# Patient Record
Sex: Male | Born: 1960 | Hispanic: No | Smoking: Former smoker
Health system: Southern US, Community
[De-identification: ages and names within clinical notes are randomized; demographics above are authoritative.]

## PROBLEM LIST (undated history)

## (undated) DIAGNOSIS — Z720 Tobacco use: Secondary | ICD-10-CM

## (undated) DIAGNOSIS — J449 Chronic obstructive pulmonary disease, unspecified: Secondary | ICD-10-CM

## (undated) DIAGNOSIS — F32A Depression, unspecified: Secondary | ICD-10-CM

## (undated) DIAGNOSIS — R0602 Shortness of breath: Secondary | ICD-10-CM

## (undated) DIAGNOSIS — I509 Heart failure, unspecified: Secondary | ICD-10-CM

## (undated) DIAGNOSIS — Z950 Presence of cardiac pacemaker: Secondary | ICD-10-CM

## (undated) DIAGNOSIS — J439 Emphysema, unspecified: Secondary | ICD-10-CM

## (undated) DIAGNOSIS — K219 Gastro-esophageal reflux disease without esophagitis: Secondary | ICD-10-CM

## (undated) DIAGNOSIS — M1990 Unspecified osteoarthritis, unspecified site: Secondary | ICD-10-CM

## (undated) DIAGNOSIS — J189 Pneumonia, unspecified organism: Secondary | ICD-10-CM

## (undated) DIAGNOSIS — R001 Bradycardia, unspecified: Secondary | ICD-10-CM

## (undated) DIAGNOSIS — T7840XA Allergy, unspecified, initial encounter: Secondary | ICD-10-CM

## (undated) DIAGNOSIS — E039 Hypothyroidism, unspecified: Secondary | ICD-10-CM

## (undated) DIAGNOSIS — I209 Angina pectoris, unspecified: Secondary | ICD-10-CM

## (undated) DIAGNOSIS — F329 Major depressive disorder, single episode, unspecified: Secondary | ICD-10-CM

## (undated) DIAGNOSIS — F419 Anxiety disorder, unspecified: Secondary | ICD-10-CM

## (undated) DIAGNOSIS — Z9289 Personal history of other medical treatment: Secondary | ICD-10-CM

## (undated) DIAGNOSIS — G40909 Epilepsy, unspecified, not intractable, without status epilepticus: Secondary | ICD-10-CM

## (undated) DIAGNOSIS — R079 Chest pain, unspecified: Secondary | ICD-10-CM

## (undated) DIAGNOSIS — R569 Unspecified convulsions: Secondary | ICD-10-CM

## (undated) DIAGNOSIS — G8929 Other chronic pain: Secondary | ICD-10-CM

## (undated) DIAGNOSIS — A310 Pulmonary mycobacterial infection: Secondary | ICD-10-CM

## (undated) HISTORY — DX: Tobacco use: Z72.0

## (undated) HISTORY — DX: Presence of cardiac pacemaker: Z95.0

## (undated) HISTORY — DX: Epilepsy, unspecified, not intractable, without status epilepticus: G40.909

## (undated) HISTORY — DX: Bradycardia, unspecified: R00.1

## (undated) HISTORY — DX: Emphysema, unspecified: J43.9

## (undated) HISTORY — DX: Chest pain, unspecified: R07.9

## (undated) HISTORY — DX: Unspecified osteoarthritis, unspecified site: M19.90

## (undated) HISTORY — PX: COLONOSCOPY: SHX174

## (undated) HISTORY — PX: TONSILLECTOMY: SUR1361

## (undated) HISTORY — DX: Heart failure, unspecified: I50.9

## (undated) HISTORY — PX: CARDIAC CATHETERIZATION: SHX172

## (undated) HISTORY — DX: Allergy, unspecified, initial encounter: T78.40XA

## (undated) HISTORY — DX: Personal history of other medical treatment: Z92.89

## (undated) HISTORY — DX: Other chronic pain: G89.29

## (undated) HISTORY — PX: EYE SURGERY: SHX253

---

## 1996-01-04 HISTORY — PX: HAND SURGERY: SHX662

## 1998-03-13 ENCOUNTER — Emergency Department (HOSPITAL_COMMUNITY): Admission: EM | Admit: 1998-03-13 | Discharge: 1998-03-13 | Payer: Self-pay | Admitting: Emergency Medicine

## 1998-03-13 ENCOUNTER — Encounter: Payer: Self-pay | Admitting: Emergency Medicine

## 1998-11-04 ENCOUNTER — Ambulatory Visit (HOSPITAL_COMMUNITY): Admission: RE | Admit: 1998-11-04 | Discharge: 1998-11-04 | Payer: Self-pay | Admitting: Orthopedic Surgery

## 1998-11-25 ENCOUNTER — Ambulatory Visit (HOSPITAL_COMMUNITY): Admission: RE | Admit: 1998-11-25 | Discharge: 1998-11-25 | Payer: Self-pay | Admitting: Orthopedic Surgery

## 1999-07-19 ENCOUNTER — Emergency Department (HOSPITAL_COMMUNITY): Admission: EM | Admit: 1999-07-19 | Discharge: 1999-07-19 | Payer: Self-pay

## 2000-02-12 ENCOUNTER — Emergency Department (HOSPITAL_COMMUNITY): Admission: EM | Admit: 2000-02-12 | Discharge: 2000-02-12 | Payer: Self-pay | Admitting: *Deleted

## 2000-04-07 ENCOUNTER — Encounter: Admission: RE | Admit: 2000-04-07 | Discharge: 2000-04-26 | Payer: Self-pay | Admitting: Specialist

## 2000-09-20 ENCOUNTER — Encounter: Payer: Self-pay | Admitting: Family Medicine

## 2000-09-20 ENCOUNTER — Ambulatory Visit (HOSPITAL_COMMUNITY): Admission: RE | Admit: 2000-09-20 | Discharge: 2000-09-20 | Payer: Self-pay | Admitting: Family Medicine

## 2000-09-30 ENCOUNTER — Emergency Department (HOSPITAL_COMMUNITY): Admission: EM | Admit: 2000-09-30 | Discharge: 2000-09-30 | Payer: Self-pay | Admitting: Emergency Medicine

## 2000-09-30 ENCOUNTER — Encounter: Payer: Self-pay | Admitting: Emergency Medicine

## 2000-09-30 ENCOUNTER — Inpatient Hospital Stay (HOSPITAL_COMMUNITY): Admission: EM | Admit: 2000-09-30 | Discharge: 2000-10-04 | Payer: Self-pay | Admitting: Emergency Medicine

## 2000-10-19 ENCOUNTER — Observation Stay (HOSPITAL_COMMUNITY): Admission: RE | Admit: 2000-10-19 | Discharge: 2000-10-20 | Payer: Self-pay | Admitting: Surgery

## 2000-10-19 ENCOUNTER — Encounter: Payer: Self-pay | Admitting: Surgery

## 2000-10-19 ENCOUNTER — Encounter (INDEPENDENT_AMBULATORY_CARE_PROVIDER_SITE_OTHER): Payer: Self-pay | Admitting: Specialist

## 2000-11-08 ENCOUNTER — Emergency Department (HOSPITAL_COMMUNITY): Admission: EM | Admit: 2000-11-08 | Discharge: 2000-11-08 | Payer: Self-pay | Admitting: Emergency Medicine

## 2000-11-08 ENCOUNTER — Encounter: Payer: Self-pay | Admitting: Emergency Medicine

## 2001-01-03 HISTORY — PX: GALLBLADDER SURGERY: SHX652

## 2001-03-19 ENCOUNTER — Emergency Department (HOSPITAL_COMMUNITY): Admission: EM | Admit: 2001-03-19 | Discharge: 2001-03-19 | Payer: Self-pay | Admitting: *Deleted

## 2001-03-19 ENCOUNTER — Encounter: Payer: Self-pay | Admitting: *Deleted

## 2001-03-30 ENCOUNTER — Encounter: Admission: RE | Admit: 2001-03-30 | Discharge: 2001-06-28 | Payer: Self-pay | Admitting: Urology

## 2001-04-11 ENCOUNTER — Inpatient Hospital Stay (HOSPITAL_COMMUNITY): Admission: EM | Admit: 2001-04-11 | Discharge: 2001-04-12 | Payer: Self-pay | Admitting: Emergency Medicine

## 2001-04-11 ENCOUNTER — Encounter: Payer: Self-pay | Admitting: Emergency Medicine

## 2001-07-18 ENCOUNTER — Emergency Department (HOSPITAL_COMMUNITY): Admission: EM | Admit: 2001-07-18 | Discharge: 2001-07-18 | Payer: Self-pay | Admitting: Emergency Medicine

## 2001-07-19 ENCOUNTER — Encounter: Payer: Self-pay | Admitting: Emergency Medicine

## 2003-04-04 DIAGNOSIS — Z950 Presence of cardiac pacemaker: Secondary | ICD-10-CM

## 2003-04-04 HISTORY — DX: Presence of cardiac pacemaker: Z95.0

## 2003-04-23 HISTORY — PX: PACEMAKER INSERTION: SHX728

## 2005-12-30 ENCOUNTER — Emergency Department (HOSPITAL_COMMUNITY): Admission: EM | Admit: 2005-12-30 | Discharge: 2005-12-30 | Payer: Self-pay | Admitting: Emergency Medicine

## 2009-04-20 ENCOUNTER — Emergency Department (HOSPITAL_COMMUNITY): Admission: EM | Admit: 2009-04-20 | Discharge: 2009-04-20 | Payer: Self-pay | Admitting: Emergency Medicine

## 2010-03-23 LAB — POCT CARDIAC MARKERS
CKMB, poc: <1 ng/mL — ABNORMAL LOW (ref 1.0–8.0)
Myoglobin, poc: 55.6 ng/mL (ref 12–200)

## 2010-03-23 LAB — DIFFERENTIAL
Basophils Absolute: 0 10*3/uL (ref 0.0–0.1)
Neutrophils Relative %: 55 % (ref 43–77)

## 2010-03-23 LAB — POCT I-STAT, CHEM 8
Calcium, Ion: 1.12 mmol/L (ref 1.12–1.32)
Chloride: 104 mEq/L (ref 96–112)
Glucose, Bld: 89 mg/dL (ref 70–99)
Hemoglobin: 14.6 g/dL (ref 13.0–17.0)
Potassium: 3.6 mEq/L (ref 3.5–5.1)
Sodium: 140 mEq/L (ref 135–145)
TCO2: 27 mmol/L (ref 0–100)

## 2010-03-23 LAB — CBC
HCT: 39.4 % (ref 39.0–52.0)
Hemoglobin: 14.1 g/dL (ref 13.0–17.0)
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
## 2010-05-21 NOTE — Op Note (Signed)
[facility]  Patient:    Michael [name], Michael [name] Visit Number: [id number] MRN: [id number]          Service Type: SUR Location: [id number] [phone] Attending Physician:  Charlton Haws Dictated by:   Currie Paris, M.D. Admit Date:  10/19/2000   CC:         Collins Scotland, M.D.                           Operative Report  VISIT NO:  [id number]  OFFICE RECORD NO:  [id number].  PREOPERATIVE DIAGNOSIS:  Chronic calculus cholecystitis.  POSTOPERATIVE DIAGNOSIS:  Chronic calculus cholecystitis.  OPERATION:  Laparoscopic cholecystectomy with operative cholangiogram.  SURGEON:  Currie Paris, M.D.  ASSISTANT:  Sheppard Plumber. Earlene Plater, M.D.  ANESTHESIA:  General endotracheal.  CLINICAL HISTORY:  The patient is a 50 year old who presented to me in September with biliary type symptoms, slightly elevated alkaline phosphatase and gallstones.  He was scheduled for laparoscopic cholecystectomy.  He had to hospitalized in interval with bradycardia, which was treated by Dr. Nicki Guadalajara, and he was felt now stable for surgery.  DESCRIPTION OF PROCEDURE:  The patient was brought to the operating room and after satisfactory general anesthesia had been obtained the abdomen was prepped and draped.  Then 0.25% Marcaine was used for each incision.  The umbilical incision was made first, the fascia picked up and opened, and the peritoneal cavity entered under direct vision.  A pursestring was placed with 0 Vicryl, and the Hasson introduced.  The abdomen was insufflated to 15 mmHg. Perusal examination of the abdomen showed no gross abnormalities.  There were some adhesions of the duodenum up to the gallbladder.  The patient was placed in reverse Trendelenburg and tilted to the left.  A 10 mm trocar was placed in the epigastrium and two 5 mm trocars laterally, under direct vision.  The gallbladder was retracted up over the liver.  The omental adhesions were taken down and  the peritoneum over the cystic duct opened, and the cystic duct and the anterior branches of the cystic artery were dissected out and clearly identified.  The cystic duct was clipped once near the junction of the gallbladder, then the cystic artery clipped once near its origin; once this had been dissected out.  The cystic duct was opened with the scissors and clear bile drained out.  There were no stones in the cystic duct that could be milked out.  A cholangiocatheter was used to thread a catheter into the abdominal cavity. This was placed into the cystic duct and held with a clip.  Operative cholangiogram was performed and appeared normal.  The cystic duct catheter was removed and three clips placed in the stay side of the cystic duct; then it was divided.  The cystic artery and anterior branches were dissected out a little further, and some more clips placed on that; it was divided leaving two clips on the stay side.  A little further dissection revealed the posterior artery, which was likewise clipped and divided.  The gallbladder was removed from below to above with coagulation and electrocautery.  Just prior to disconnecting from the top of the gallbladder bed we checked to make sure everything was dry.  The gallbladder was then disconnected and brought out the umbilical port.  The umbilical port was occluded temporarily while we reinsufflated and reirrigated to make sure everything was dry; again, everything appeared okay.  The lateral  ports were removed and there was no bleeding.  The umbilical port pursestring was tied down and appeared intact.  The abdomen was deflated through the epigastric port.  Skin incisions were closed with 4-0 Monocryl subcuticular and Steri-Strips.  I did put a fascial suture in the epigastric port as well.  The patient tolerated the procedure well. There were no operative complications.  All counts were correct. Dictated by:   Currie Paris,  M.D. Attending Physician:  Charlton Haws DD:  10/19/00 TD:  10/20/00 Job: [id number]

## 2010-05-21 NOTE — Cardiovascular Report (Signed)
[place]. [facility]  Patient:    Michael [name], Michael [name] Visit Number: [id number] MRN: [id number]          Service Type: MED Location: [phone] Attending Physician:  Pamella Pert Dictated by:   Delrae Rend, M.D. Proc. Date: 04/11/01 Admit Date:  04/11/2001                          Cardiac Catheterization  PROCEDURES PERFORMED: 1. Selective left ventriculography. 2. Selective right and left coronary arteriography. 3. Atrial angiography. 4. Closure of the right femoral arterial access with Perclose.  INDICATIONS:  Michael [name] is a 50 year old gentleman with a history of bipolar disorder, smoking, COPD, who presents to the emergency room with chest pain suggestive of angina pectoris.  He was evaluated by the emergency room physician in which he had an abnormal ECG in the form of ______ polarization. A question of acute myocardial injury was also raised and the cardiac catheterization lab was activated.  He was brought to the cardiac catheterization lab to make a definite diagnosis of coronary artery disease.  He presents for cardiac catheterization.  The patient and his family are willing to proceed.  RIGHT HEART CATHETERIZATION:  HEMODYNAMIC DATA: 1. Left ventricular pressures were 97 with an end diastolic pressure of    ______ mmHg. 2. Central aortic pressures were 95/67 mmHg. 3. There was no pressure gradient across the aortic valve.  ANGIOGRAPHIC DATA:  LEFT VENTRICULOGRAPHY:  Left ventricular systolic function is estimated around 50-55%.  There is no wall motion abnormality.  There is no significant mitral regurgitation.  CORONARY ARTERIOGRAM: 1. Right coronary artery:  The right coronary artery is a dominant vessel.  It    gives origin to a large PDA and PLV branches.  It is disease-free.  2. Left main coronary artery:  The left main coronary artery is a large    caliber vessel.  It is disease-free.  3. Left anterior descending  artery:  The left anterior descending artery is a    large caliber vessel.  It gives origin to a moderate sized high diagonal #1    and a diagonal #2 which are disease-free.  It also gives origin to a    septal perforator.  The LAD itself is disease-free.  4. Circumflex coronary artery:  The circumflex coronary artery is a large    caliber vessel.  It gives origin to a large obtuse marginal #1.  It is    disease-free.  IMPRESSION: 1. Normal left ventricular systolic function.  Ejection fraction 50-55%. 2. Normal coronary arteries.  RECOMMENDATIONS:  Evaluation for noncardiac cause of chest pain as indicated. The patient will be observed overnight and will be discharged home in the morning.  TECHNIQUE OF PROCEDURE:  Under usual sterile precautions using #6 French right femoral arterial access, a #6 [place] multipurpose Beta catheter was advanced to the ascending aorta over a 0.035-mm J wire.  The catheter was then advanced to the left ventricle and left ventricular pressures were monitored.  Hand contrast injection of the left ventricle was performed in both RAO and LAO projections.  The catheter was then flushed with saline and pulled back into the ascending aorta.  Pressure gradient across the aortic valve was monitored. The catheter was then manipulated into the right coronary artery and angiography was performed.  In a similar fashion, the left main coronary artery was selectively engaged and angiography was performed.  Then the catheter was  exchanged to a #6 [place] Judkins left diagnostic catheter and angiography was performed to obtain better quality pictures.  Then the catheter was pulled out of the body over the 0.035-mm J wire.  Atrial angiography was performed through the arterial access sheath and the arterial access was attempted to close with Perclose and because of inability to obtain hemostasis, manual pressure was held.  FemStop was applied and patient was transferred  to the floor in stable condition.  Twenty milligrams of protamine was administered to reverse his anticoagulation.  The patient tolerated the procedure well. Dictated by:   Delrae Rend, M.D. Attending Physician:  Pamella Pert DD:  04/11/01 TD:  04/12/01 Job: [id number]
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
## 2010-07-08 ENCOUNTER — Emergency Department (INDEPENDENT_AMBULATORY_CARE_PROVIDER_SITE_OTHER): Payer: Medicare Other

## 2010-07-08 ENCOUNTER — Emergency Department (HOSPITAL_BASED_OUTPATIENT_CLINIC_OR_DEPARTMENT_OTHER)
Admission: EM | Admit: 2010-07-08 | Discharge: 2010-07-08 | Disposition: A | Discharge status: Home / Self Care | Payer: Medicare Other | Attending: Emergency Medicine | Admitting: Emergency Medicine

## 2010-07-08 DIAGNOSIS — K219 Gastro-esophageal reflux disease without esophagitis: Secondary | ICD-10-CM | POA: Insufficient documentation

## 2010-07-08 DIAGNOSIS — R109 Unspecified abdominal pain: Secondary | ICD-10-CM

## 2010-07-08 DIAGNOSIS — S022XXA Fracture of nasal bones, initial encounter for closed fracture: Secondary | ICD-10-CM | POA: Insufficient documentation

## 2010-07-08 DIAGNOSIS — R040 Epistaxis: Secondary | ICD-10-CM

## 2010-07-08 DIAGNOSIS — R51 Headache: Secondary | ICD-10-CM | POA: Insufficient documentation

## 2010-07-08 DIAGNOSIS — R209 Unspecified disturbances of skin sensation: Secondary | ICD-10-CM

## 2010-07-08 DIAGNOSIS — J449 Chronic obstructive pulmonary disease, unspecified: Secondary | ICD-10-CM

## 2010-07-08 DIAGNOSIS — I2510 Atherosclerotic heart disease of native coronary artery without angina pectoris: Secondary | ICD-10-CM | POA: Insufficient documentation

## 2010-07-08 DIAGNOSIS — R0789 Other chest pain: Secondary | ICD-10-CM

## 2010-07-08 DIAGNOSIS — N200 Calculus of kidney: Secondary | ICD-10-CM | POA: Insufficient documentation

## 2010-07-08 DIAGNOSIS — R079 Chest pain, unspecified: Secondary | ICD-10-CM | POA: Insufficient documentation

## 2010-07-08 LAB — URINE MICROSCOPIC-ADD ON

## 2010-07-08 LAB — RAPID URINE DRUG SCREEN, HOSP PERFORMED
Amphetamines: NOT DETECTED
Benzodiazepines: NOT DETECTED
Opiates: NOT DETECTED
Tetrahydrocannabinol: NOT DETECTED

## 2010-07-08 LAB — CBC
MCH: 34.1 pg — ABNORMAL HIGH (ref 26.0–34.0)
MCHC: 34.9 g/dL (ref 30.0–36.0)
MCV: 97.6 fL (ref 78.0–100.0)
RBC: 3.81 MIL/uL — ABNORMAL LOW (ref 4.22–5.81)

## 2010-07-08 LAB — BASIC METABOLIC PANEL
Calcium: 9.3 mg/dL (ref 8.4–10.5)
Chloride: 99 mEq/L (ref 96–112)
GFR calc non Af Amer: >60 mL/min (ref >60)
Potassium: 3.5 mEq/L (ref 3.5–5.1)
Sodium: 138 mEq/L (ref 135–145)

## 2010-07-08 LAB — ETHANOL: Alcohol, Ethyl (B): <11 mg/dL (ref 0–11)

## 2010-07-08 LAB — URINALYSIS, ROUTINE W REFLEX MICROSCOPIC
Glucose, UA: NEGATIVE mg/dL
Leukocytes, UA: NEGATIVE
Protein, ur: 30 mg/dL — AB
Urobilinogen, UA: 1 mg/dL (ref 0.0–1.0)

## 2010-07-08 MED ORDER — IOHEXOL 300 MG/ML  SOLN
100.0000 mL | Freq: Once | INTRAMUSCULAR | Status: AC | PRN
  Administered 2010-07-08: 100 mL via INTRAVENOUS

## 2011-06-14 DIAGNOSIS — Z9289 Personal history of other medical treatment: Secondary | ICD-10-CM

## 2011-06-14 HISTORY — DX: Personal history of other medical treatment: Z92.89

## 2012-04-23 ENCOUNTER — Encounter: Payer: Self-pay | Admitting: *Deleted

## 2012-05-08 ENCOUNTER — Other Ambulatory Visit (HOSPITAL_COMMUNITY): Payer: Self-pay | Admitting: Cardiovascular Disease

## 2012-05-08 DIAGNOSIS — I341 Nonrheumatic mitral (valve) prolapse: Secondary | ICD-10-CM

## 2012-05-14 ENCOUNTER — Ambulatory Visit (HOSPITAL_COMMUNITY)
Admission: RE | Admit: 2012-05-14 | Discharge: 2012-05-14 | Disposition: A | Discharge status: Home / Self Care | Payer: Medicare PPO | Source: Ambulatory Visit | Attending: Cardiovascular Disease | Admitting: Cardiovascular Disease

## 2012-05-14 DIAGNOSIS — I079 Rheumatic tricuspid valve disease, unspecified: Secondary | ICD-10-CM | POA: Insufficient documentation

## 2012-05-14 DIAGNOSIS — I059 Rheumatic mitral valve disease, unspecified: Secondary | ICD-10-CM | POA: Insufficient documentation

## 2012-05-14 DIAGNOSIS — I341 Nonrheumatic mitral (valve) prolapse: Secondary | ICD-10-CM

## 2012-05-14 HISTORY — PX: TRANSTHORACIC ECHOCARDIOGRAM: SHX275

## 2012-05-14 NOTE — Progress Notes (Signed)
2D Echo Performed 05/14/2012    Lavelle Akel, RCS  

## 2012-07-09 ENCOUNTER — Telehealth: Payer: Self-pay | Admitting: Cardiovascular Disease

## 2012-07-09 NOTE — Telephone Encounter (Signed)
Mr.Tester is has a dentist appointment on Wednesday and his mother wants to know if heeds to take amoxicillin an hour before his cleaning .[name] Please call    Thanks

## 2012-07-09 NOTE — Telephone Encounter (Signed)
Returned call.  Informed Dr. Alanda Amass will be in the office tomorrow and message will be left for him to review and advise.  Verbalized understanding.  Message forwarded to [facility]. Berlinda Last, LPN to discuss w/ Dr. Alanda Amass.  This note and paper chart# [id number] placed on Dr. Kandis Cocking cart.

## 2012-07-10 NOTE — Telephone Encounter (Signed)
Pt. Called and message left that he did not need amoxicillin before teeth cleaning

## 2012-08-31 ENCOUNTER — Other Ambulatory Visit: Payer: Self-pay | Admitting: *Deleted

## 2012-08-31 ENCOUNTER — Encounter: Payer: Self-pay | Admitting: Cardiovascular Disease

## 2012-08-31 DIAGNOSIS — Z01812 Encounter for preprocedural laboratory examination: Secondary | ICD-10-CM

## 2012-09-04 ENCOUNTER — Other Ambulatory Visit: Payer: Self-pay | Admitting: Cardiovascular Disease

## 2012-09-04 LAB — CBC WITH DIFFERENTIAL/PLATELET
Basophils Absolute: 0 10*3/uL (ref 0.0–0.1)
Eosinophils Absolute: 0.3 10*3/uL (ref 0.0–0.7)
Eosinophils Relative: 4 % (ref 0–5)
MCH: 34.5 pg — ABNORMAL HIGH (ref 26.0–34.0)
MCHC: 35.1 g/dL (ref 30.0–36.0)
MCV: 98.3 fL (ref 78.0–100.0)
Monocytes Absolute: 0.7 10*3/uL (ref 0.1–1.0)
Platelets: 302 10*3/uL (ref 150–400)
RDW: 13.1 % (ref 11.5–15.5)

## 2012-09-04 LAB — URINALYSIS, ROUTINE W REFLEX MICROSCOPIC
Glucose, UA: NEGATIVE mg/dL
Hgb urine dipstick: NEGATIVE
Leukocytes, UA: NEGATIVE
Nitrite: NEGATIVE
Specific Gravity, Urine: 1.016 (ref 1.005–1.030)
pH: 6 (ref 5.0–8.0)

## 2012-09-04 LAB — COMPREHENSIVE METABOLIC PANEL
CO2: 30 mEq/L (ref 19–32)
Calcium: 9.4 mg/dL (ref 8.4–10.5)
Chloride: 101 mEq/L (ref 96–112)
Creat: 0.88 mg/dL (ref 0.50–1.35)
Glucose, Bld: 88 mg/dL (ref 70–99)
Total Bilirubin: 0.4 mg/dL (ref 0.3–1.2)
Total Protein: 6.6 g/dL (ref 6.0–8.3)

## 2012-09-04 LAB — T4, FREE: Free T4: 0.89 ng/dL (ref 0.80–1.80)

## 2012-09-04 LAB — LIPID PANEL
Cholesterol: 153 mg/dL (ref 0–200)
Total CHOL/HDL Ratio: 3.4 Ratio
Triglycerides: 96 mg/dL (ref <150)
VLDL: 19 mg/dL (ref 0–40)

## 2012-09-04 LAB — T3, FREE: T3, Free: 3.4 pg/mL (ref 2.3–4.2)

## 2012-09-05 MED ORDER — CEFAZOLIN SODIUM-DEXTROSE 2-3 GM-% IV SOLR
2.0000 g | INTRAVENOUS | Status: DC
  Filled 2012-09-05: qty 50

## 2012-09-05 MED ORDER — SODIUM CHLORIDE 0.9 % IR SOLN
80.0000 mg | Status: DC
  Filled 2012-09-05: qty 2

## 2012-09-06 ENCOUNTER — Ambulatory Visit (HOSPITAL_COMMUNITY): Payer: Medicare PPO

## 2012-09-06 ENCOUNTER — Ambulatory Visit (HOSPITAL_COMMUNITY)
Admission: RE | Admit: 2012-09-06 | Discharge: 2012-09-06 | Disposition: A | Discharge status: Home / Self Care | Payer: Medicare PPO | Source: Ambulatory Visit | Attending: Cardiovascular Disease | Admitting: Cardiovascular Disease

## 2012-09-06 ENCOUNTER — Encounter: Payer: Self-pay | Admitting: *Deleted

## 2012-09-06 ENCOUNTER — Encounter (HOSPITAL_COMMUNITY)
Admission: RE | Disposition: A | Discharge status: Home / Self Care | Payer: Self-pay | Source: Ambulatory Visit | Attending: Cardiovascular Disease

## 2012-09-06 DIAGNOSIS — F17200 Nicotine dependence, unspecified, uncomplicated: Secondary | ICD-10-CM | POA: Insufficient documentation

## 2012-09-06 DIAGNOSIS — R001 Bradycardia, unspecified: Secondary | ICD-10-CM

## 2012-09-06 DIAGNOSIS — I498 Other specified cardiac arrhythmias: Secondary | ICD-10-CM | POA: Insufficient documentation

## 2012-09-06 DIAGNOSIS — F79 Unspecified intellectual disabilities: Secondary | ICD-10-CM | POA: Insufficient documentation

## 2012-09-06 DIAGNOSIS — I959 Hypotension, unspecified: Secondary | ICD-10-CM | POA: Insufficient documentation

## 2012-09-06 DIAGNOSIS — Z950 Presence of cardiac pacemaker: Secondary | ICD-10-CM

## 2012-09-06 DIAGNOSIS — Z01812 Encounter for preprocedural laboratory examination: Secondary | ICD-10-CM

## 2012-09-06 DIAGNOSIS — Z45018 Encounter for adjustment and management of other part of cardiac pacemaker: Secondary | ICD-10-CM | POA: Insufficient documentation

## 2012-09-06 DIAGNOSIS — Z79899 Other long term (current) drug therapy: Secondary | ICD-10-CM | POA: Insufficient documentation

## 2012-09-06 HISTORY — PX: PERMANENT PACEMAKER GENERATOR CHANGE: SHX6022

## 2012-09-06 SURGERY — PERMANENT PACEMAKER GENERATOR CHANGE
Anesthesia: LOCAL

## 2012-09-06 MED ORDER — CHLORHEXIDINE GLUCONATE 4 % EX LIQD
60.0000 mL | Freq: Once | CUTANEOUS | Status: AC
  Administered 2012-09-06: 4 via TOPICAL

## 2012-09-06 MED ORDER — SODIUM CHLORIDE 0.9 % IV SOLN: INTRAVENOUS | Status: DC

## 2012-09-06 MED ORDER — ONDANSETRON HCL 4 MG/2ML IJ SOLN: 4.0000 mg | Freq: Four times a day (QID) | INTRAMUSCULAR | Status: DC | PRN

## 2012-09-06 MED ORDER — ACETAMINOPHEN 325 MG PO TABS: 325.0000 mg | ORAL_TABLET | ORAL | Status: DC | PRN

## 2012-09-06 MED ORDER — LIDOCAINE HCL (PF) 1 % IJ SOLN
INTRAMUSCULAR | Status: AC
  Filled 2012-09-06: qty 60

## 2012-09-06 MED ORDER — MUPIROCIN 2 % EX OINT
TOPICAL_OINTMENT | CUTANEOUS | Status: AC
  Administered 2012-09-06: 1
  Filled 2012-09-06: qty 22

## 2012-09-06 MED ORDER — SODIUM CHLORIDE 0.9 % IV SOLN
INTRAVENOUS | Status: DC
  Administered 2012-09-06: 08:00:00 via INTRAVENOUS

## 2012-09-06 MED ORDER — HYDROCODONE-ACETAMINOPHEN 5-325 MG PO TABS: 1.0000 | ORAL_TABLET | ORAL | Status: DC | PRN

## 2012-09-06 MED ORDER — HEPARIN (PORCINE) IN NACL 2-0.9 UNIT/ML-% IJ SOLN
INTRAMUSCULAR | Status: AC
  Filled 2012-09-06: qty 500

## 2012-09-06 NOTE — H&P (Signed)
  Date of Initial H&P: 08/31/2012  History reviewed, patient examined, no change in status, stable for surgery. Here for dual chamber pacemaker changeout - symptomatic bradycardia abd generator at ERI. When device reverted to VVI mode he became lightheaded and has ben hypotensive. This procedure has been fully reviewed with the patient and written informed consent has been obtained. Parents have medical/legal POA due to his neuropsychiatric limitations.  Thurmon Fair, MD, [facility] [phone] office [phone] pager

## 2012-09-06 NOTE — Op Note (Signed)
Procedure report  Procedure performed:  1. Dual chamber pacemaker generator changeout  2. Light sedation  Reason for procedure:  1. Device generator at elective replacement interval  Procedure performed by:  Thurmon Fair, MD  Complications:  None  Estimated blood loss:  <5 mL  Medications administered during procedure:  Ancef 2 g intravenously lidocaine 1% 30 mL locally  Device details:   New Generator Medtronic Adapta L model number ADDRL1, serial number [id number] Right atrial lead (chronic) Medtronic [id number], serial [id number] (implanted 04/23/2003) Right ventricular lead (chronic)  Medtronic [id number],  serial number [id number] (implanted 04/23/2003)  Explanted generator Medtronic Kappa [id number] serial number  [id number]  Procedure details:  After the risks and benefits of the procedure were discussed the patient provided informed consent. She was brought to the cardiac catheter lab in the fasting state. The patient was prepped and draped in usual sterile fashion. Local anesthesia with 1% lidocaine was administered to to the left infraclavicular area. A 5-6cm horizontal incision was made parallel with and 2-3 cm caudal to the left clavicle, in the area of an old scar. An older scar was seen closer to the left clavicle. Using minimal electrocautery and mostly sharp and blunt dissection the prepectoral pocket was opened carefully to avoid injury to the loops of chronic leads. Extensive dissection was not necessary. The device was explanted. The pocket was carefully inspected for hemostasis and flushed with copious amounts of antibiotic solution.  The leads were disconnected from the old generator and testing of the lead parameters later showed excellent values. The new generator was connected to the chronic leads, with appropriate pacing noted.   The entire system was then carefully inserted in the pocket with care been taking that the leads and device assumed a comfortable position  without pressure on the incision. Great care was taken that the leads be located deep to the generator. The pocket was then closed in layers using 2 layers of 2-0 Vicryl and cutaneous staples after which a sterile dressing was applied.   At the end of the procedure the following lead parameters were encountered:   Right atrial lead sensed P waves 2.7 mV, impedance 410 ohms, threshold 0.4 at 0.5 ms pulse width.  Right ventricular lead sensed R waves  17.1 mV, impedance 557 ohms, threshold 0.6 at 0.5 ms pulse width.  Thurmon Fair, MD, [facility] [phone] office [phone] pager

## 2012-09-14 ENCOUNTER — Ambulatory Visit (INDEPENDENT_AMBULATORY_CARE_PROVIDER_SITE_OTHER): Payer: 59 | Admitting: Physician Assistant

## 2012-09-14 ENCOUNTER — Encounter: Payer: Self-pay | Admitting: Physician Assistant

## 2012-09-14 VITALS — BP 110/60 | HR 68 | Ht 67.0 in | Wt 128.0 lb

## 2012-09-14 DIAGNOSIS — R634 Abnormal weight loss: Secondary | ICD-10-CM

## 2012-09-14 DIAGNOSIS — Z45018 Encounter for adjustment and management of other part of cardiac pacemaker: Secondary | ICD-10-CM

## 2012-09-14 NOTE — Progress Notes (Signed)
Date:  09/14/2012   ID:  Michael [name], DOB 1960/05/17, MRN [id number]  PCP:  Provider Not In System  Primary Cardiologist:  Alanda Amass     History of Present Illness: Michael [name] is a 52 y.o. 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 Had normal coronaries by catheterization in 2002 and 2003.   Patient presents today after having a generator change on 09/06/2012.  The new device is a:  Personnel officer L model number ADDRL1, serial number [id number]  Right atrial lead (chronic) Medtronic [id number], serial [id number] (implanted 04/23/2003)  Right ventricular lead (chronic) Medtronic [id number], serial number [id number] (implanted 04/23/2003)   Incidentally the patient's had approximately 20 pound weight loss in the last 8 months. After discussion with him and his mother he does not eat a whole lot. He typically just smokes and drinks [name]. I saw him approximately 1600 hours and he had an eat anything all day.  He had a chest x-ray on 09/06/2012 which according to the radiologist, did not show any pulmonary nodules.  The patient currently denies nausea, vomiting, fever, chest pain, shortness of breath, orthopnea, dizziness, PND, cough, congestion, abdominal pain, hematochezia, melena, lower extremity edema.  Wt Readings from Last 3 Encounters:  09/14/12 128 lb (58.06 kg)  09/06/12 130 lb (58.968 kg)  09/06/12 130 lb (58.968 kg)     Past Medical History  Diagnosis Date  . 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 Bradycardia   . Tobacco abuse   . Chronic chest pain   . Seizure disorder     Current Outpatient Prescriptions  Medication Sig Dispense Refill  . albuterol (PROVENTIL HFA;VENTOLIN HFA) 108 (90 BASE) MCG/ACT inhaler Inhale 2 puffs into the lungs every 6 (six) hours as needed for wheezing.      . carbamazepine (CARBATROL) 200  MG 12 hr capsule Take 200 mg by mouth 2 (two) times daily.      [name] FLUoxetine (PROZAC) 20 MG capsule Take 20 mg by mouth 2 (two) times daily.      . Fluticasone-Salmeterol (ADVAIR) 100-50 MCG/DOSE AEPB Inhale 1 puff into the lungs every 12 (twelve) hours.      [name] ipratropium (ATROVENT HFA) 17 MCG/ACT inhaler Inhale 2 puffs into the lungs every 6 (six) hours.      [name] omeprazole (PRILOSEC) 20 MG capsule Take 20 mg by mouth daily.       No current facility-administered medications for this visit.    Allergies:   No Known Allergies  Social History:  The patient  reports that he has been smoking.  He does not have any smokeless tobacco history on file.   Family history:  No family history on file.  ROS:  Please see the history of present illness.  All other systems reviewed and negative.   PHYSICAL EXAM: VS:  BP 110/60  Pulse 68  Ht 5\' 7"  (1.702 m)  Wt 128 lb (58.06 kg)  BMI 20.04 kg/m2 Well nourished, well developed, in no acute distress HEENT: Pupils are equal round react to light accommodation extraocular movements are intact.  Cardiac: Regular rate and rhythm without murmurs rubs or gallops. Lungs:  clear to auscultation bilaterally, no wheezing, rhonchi or rales Skin: warm and dry,  No ecchymosis, erythema at the pacer site Neuro:  Grossly normal      ASSESSMENT AND PLAN:  Problem List Items Addressed This  Visit   Weight loss   Elective replacement indicated for pacemaker - Primary     The patient site is well-healed with no signs of infection. 4 staples were removed. Patient will followup in 30 days for a pacemaker check.

## 2012-09-14 NOTE — Assessment & Plan Note (Signed)
The patient site is well-healed with no signs of infection. 4 staples were removed. Patient will followup in 30 days for a pacemaker check.

## 2012-09-14 NOTE — Patient Instructions (Signed)
Follow up in 30 day s for pacer check.

## 2012-10-15 ENCOUNTER — Encounter: Payer: Self-pay | Admitting: *Deleted

## 2012-10-15 ENCOUNTER — Ambulatory Visit: Payer: 59 | Admitting: Cardiovascular Disease

## 2012-10-22 ENCOUNTER — Encounter: Payer: Self-pay | Admitting: *Deleted

## 2012-10-23 ENCOUNTER — Ambulatory Visit (INDEPENDENT_AMBULATORY_CARE_PROVIDER_SITE_OTHER): Payer: Medicare PPO | Admitting: Cardiovascular Disease

## 2012-10-23 ENCOUNTER — Encounter: Payer: 59 | Admitting: Cardiovascular Disease

## 2012-10-23 ENCOUNTER — Encounter: Payer: Self-pay | Admitting: Cardiovascular Disease

## 2012-10-23 VITALS — BP 90/52 | HR 65 | Resp 20 | Ht 69.5 in | Wt 131.4 lb

## 2012-10-23 DIAGNOSIS — Z950 Presence of cardiac pacemaker: Secondary | ICD-10-CM

## 2012-10-23 DIAGNOSIS — R001 Bradycardia, unspecified: Secondary | ICD-10-CM

## 2012-10-23 DIAGNOSIS — R079 Chest pain, unspecified: Secondary | ICD-10-CM

## 2012-10-23 DIAGNOSIS — I498 Other specified cardiac arrhythmias: Secondary | ICD-10-CM

## 2012-10-23 LAB — PACEMAKER DEVICE OBSERVATION
AL AMPLITUDE: 5.6 mv
AL IMPEDENCE PM: 479 Ohm
AL THRESHOLD: 0.75 V
BATTERY VOLTAGE: 100 V
RV LEAD AMPLITUDE: 22.4 mv

## 2012-10-23 NOTE — Patient Instructions (Addendum)
Remote monitoring is used to monitor your pacemaker from home. This monitoring reduces the number of office visits required to check your device to one time per year. It allows [place] to keep an eye on the functioning of your device to ensure it is working properly. You are scheduled for a device check from home on 01-24-2013. You may send your transmission at any time that day. If you have a wireless device, the transmission will be sent automatically. After your physician reviews your transmission, you will receive a postcard with your next transmission date.  Your physician recommends that you schedule a follow-up appointment in: 1 year

## 2012-11-04 ENCOUNTER — Encounter: Payer: Self-pay | Admitting: Cardiovascular Disease

## 2012-11-04 NOTE — Progress Notes (Signed)
Patient ID: Michael [name], male   DOB: Nov 24, 1960, 52 y.o.   MRN: [id number]      Reason for office visit Pacemaker followup  It has been roughly a month since [place] he received a new pacemaker generator. 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 The site has healed well. He has not had syncope or other complications. Comprehensive and of his device check shows normal function. I think he is well suited for remote pacemaker checked every 3 months with a yearly office visit. He continues to smoke compulsively. He has a long-standing history of seizures and some type of developmental neurological disorder. He shows no interest whatsoever in smoking cessation. Allergies  Allergen Reactions  . Shellfish Allergy Nausea And Vomiting    Current Outpatient Prescriptions  Medication Sig Dispense Refill  . albuterol (PROVENTIL HFA;VENTOLIN HFA) 108 (90 BASE) MCG/ACT inhaler Inhale 2 puffs into the lungs every 6 (six) hours as needed for wheezing.      . carbamazepine (CARBATROL) 200 MG 12 hr capsule Take 200 mg by mouth 2 (two) times daily.      [name] FLUoxetine (PROZAC) 20 MG capsule Take 20 mg by mouth 2 (two) times daily.      . Fluticasone-Salmeterol (ADVAIR) 100-50 MCG/DOSE AEPB Inhale 1 puff into the lungs every 12 (twelve) hours.      [name] ipratropium (ATROVENT HFA) 17 MCG/ACT inhaler Inhale 2 puffs into the lungs every 6 (six) hours.      [name] omeprazole (PRILOSEC) 20 MG capsule Take 20 mg by mouth daily.       No current facility-administered medications for this visit.    Past Medical History  Diagnosis Date  . 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 Bradycardia   . Tobacco abuse   . Chronic chest pain   . Seizure disorder   . History of nuclear stress test 06/14/2011    lexiscan; mild perfusion defect in basal inferosetpal & apical inferior region; negative for ischemia     Past Surgical History  Procedure  Laterality Date  . Pacemaker insertion  04/23/2003    Medtronic Kappa; [facility] - symptomatic bradycardia  . Cardiac catheterization  2002, 2003    normal coronaries  . Transthoracic echocardiogram  05/14/2012    EF 50-55%, normal systolic function; mild MR (ordered for mitral valve disease 424.0)  . Hand surgery  1998    x3  . Eye surgery  [age] y.o.  . Gallbladder surgery  2003    Family History  Problem Relation Age of Onset  . Heart Problems Mother     MVP  . CAD Father   . Heart Problems Other     aunts x 2 died of heart problems   . Heart attack Maternal Grandmother   . Hypertension Maternal Grandfather   . Heart attack Paternal Grandmother   . Heart attack Paternal Grandfather     History   Social History  . Marital Status: Single    Spouse Name: N/A    Number of Children: 2  . Years of Education: 12   Occupational History  . Not on file.   Social History Main Topics  . Smoking status: Current Every Day Smoker -- 1.50 packs/day for 40 years  . Smokeless tobacco: Not on file  . Alcohol Use: Not on file  . Drug Use: Not on file  . Sexual Activity: Not on file  Other Topics Concern  . Not on file   Social History Narrative  . No narrative on file    Review of systems: The patient specifically denies any chest pain at rest exertion, dyspnea at rest or with exertion, orthopnea, paroxysmal nocturnal dyspnea, syncope, palpitations, focal neurological deficits, intermittent claudication, lower extremity edema, unexplained weight gain, cough, hemoptysis or wheezing.   PHYSICAL EXAM BP 90/52  Pulse 65  Resp 20  Ht 5' 9.5" (1.765 m)  Wt 131 lb 6.4 oz (59.603 kg)  BMI 19.13 kg/m2  General: Alert, oriented x3, no distress, under nourished Head: no evidence of trauma, PERRL, EOMI, no exophtalmos or lid lag, no myxedema, no xanthelasma; normal ears, nose and oropharynx Neck: normal jugular venous pulsations and no hepatojugular reflux; brisk  carotid pulses without delay and no carotid bruits Chest: clear to auscultation, no signs of consolidation by percussion or palpation, normal fremitus, symmetrical and full respiratory excursions; well-healed pacemaker scar Cardiovascular: normal position and quality of the apical impulse, regular rhythm, normal first and second heart sounds, no murmurs, rubs or gallops Abdomen: no tenderness or distention, no masses by palpation, no abnormal pulsatility or arterial bruits, normal bowel sounds, no hepatosplenomegaly Extremities: no clubbing, cyanosis or edema; 2+ radial, ulnar and brachial pulses bilaterally; 2+ right femoral, posterior tibial and dorsalis pedis pulses; 2+ left femoral, posterior tibial and dorsalis pedis pulses; no subclavian or femoral bruits Neurological: grossly nonfocal   EKG:  atrial paced ventricular sensed  Lipid Panel     Component Value Date/Time   CHOL 153 09/04/2012 0839   TRIG 96 09/04/2012 0839   HDL 45 09/04/2012 0839   CHOLHDL 3.4 09/04/2012 0839   VLDL 19 09/04/2012 0839   LDLCALC 89 09/04/2012 0839    BMET    Component Value Date/Time   NA 137 09/04/2012 0839   K 4.5 09/04/2012 0839   CL 101 09/04/2012 0839   CO2 30 09/04/2012 0839   GLUCOSE 88 09/04/2012 0839   BUN 13 09/04/2012 0839   CREATININE 0.88 09/04/2012 0839   CREATININE 1.00 07/08/2010 0126   CALCIUM 9.4 09/04/2012 0839   GFRNONAA >60 07/08/2010 0126   GFRAA >60 07/08/2010 0126     ASSESSMENT AND PLAN Pacemaker - Medtronic 2005, generator change September 2014 Pacemaker check in clinic. Normal device function. Thresholds, sensing, impedances consistent with previous measurements.  1 mode switch(<1%---<30 sec/no EGMs). 4 high ventricular rates noted---max duration of 1  min 52 sec, Max V 157, Max Avg V 154, all consistent with brief sinus tachycardia. Device programmed at appropriate safety margins. Histogram distribution appropriate for patient activity level. Device programmed to optimize intrinsic conduction.  Estimated longevity 13.5 years. Patient will follow up remotely on 01-24-2013, and with MD in 1 year.   Orders Placed This Encounter  Procedures  . Pacemaker Device Observation  . EKG 12-Lead    Ysabella Babiarz  Thurmon Fair, MD, [facility] [phone] office [phone] pager

## 2012-11-04 NOTE — Assessment & Plan Note (Signed)
Pacemaker check in clinic. Normal device function. Thresholds, sensing, impedances consistent with previous measurements.  1 mode switch(<1%---<30 sec/no EGMs). 4 high ventricular rates noted---max duration of 1  min 52 sec, Max V 157, Max Avg V 154, all consistent with brief sinus tachycardia. Device programmed at appropriate safety margins. Histogram distribution appropriate for patient activity level. Device programmed to optimize intrinsic conduction. Estimated longevity 13.5 years. Patient will follow up remotely on 01-24-2013, and with MD in 1 year.

## 2012-11-22 ENCOUNTER — Telehealth: Payer: Self-pay | Admitting: *Deleted

## 2012-11-22 NOTE — Telephone Encounter (Signed)
Returned call and pt verified x 2 w/ mother, [name].  Stated pt's insurance will not pay for last OV w/ Dr. Alanda Amass b/c he was considered a specialist.  Stated they told her pt needs a letter of referral that pt could see Dr. Alanda Amass so the visit will be covered.  Stated they bill was for over $800 for "[facility]."  Mother informed this may because the group became a part of [facility] and Dr. Alanda Amass did not, which means he would have billed separately from the former [facility].  Mother informed Dr. Ellwood Handler will be notified.  Verbalized understanding.

## 2012-11-22 NOTE — Telephone Encounter (Signed)
Dr. Alanda Amass was his cardiologist and referred him to me for generator change out. This is a billing issue. I wonder if Dr. Alanda Amass was "out of network" or if his credentialing had not yet been resolved when the bill was sent. I cannot refer the patient to Dr. Alanda Amass retroactively. He referred the patient to me. I'll see if our billing folks can help. Kidney fix this, Bjorn Loser?

## 2012-11-26 NOTE — Telephone Encounter (Signed)
Patient received bill from [facility] b/c provider was out of network with [employer].  As that was beyond patient's control, the bill has been adjusted, with the patient owing nothing.

## 2012-11-26 NOTE — Telephone Encounter (Signed)
Message transferred to [facility] for review.

## 2012-11-28 ENCOUNTER — Telehealth: Payer: Self-pay | Admitting: Internal Medicine

## 2012-11-28 NOTE — Telephone Encounter (Signed)
Pt saw Dr C 10-23-12/mt °

## 2012-12-19 ENCOUNTER — Emergency Department (HOSPITAL_COMMUNITY)
Admission: EM | Admit: 2012-12-19 | Discharge: 2012-12-19 | Disposition: A | Discharge status: Home / Self Care | Payer: Medicare PPO | Attending: Emergency Medicine | Admitting: Emergency Medicine

## 2012-12-19 ENCOUNTER — Encounter (HOSPITAL_COMMUNITY): Payer: Self-pay | Admitting: Emergency Medicine

## 2012-12-19 DIAGNOSIS — H9209 Otalgia, unspecified ear: Secondary | ICD-10-CM | POA: Insufficient documentation

## 2012-12-19 DIAGNOSIS — X58XXXA Exposure to other specified factors, initial encounter: Secondary | ICD-10-CM | POA: Insufficient documentation

## 2012-12-19 DIAGNOSIS — H5710 Ocular pain, unspecified eye: Secondary | ICD-10-CM | POA: Insufficient documentation

## 2012-12-19 DIAGNOSIS — R42 Dizziness and giddiness: Secondary | ICD-10-CM | POA: Insufficient documentation

## 2012-12-19 DIAGNOSIS — Z8679 Personal history of other diseases of the circulatory system: Secondary | ICD-10-CM | POA: Insufficient documentation

## 2012-12-19 DIAGNOSIS — Z79899 Other long term (current) drug therapy: Secondary | ICD-10-CM | POA: Insufficient documentation

## 2012-12-19 DIAGNOSIS — Z950 Presence of cardiac pacemaker: Secondary | ICD-10-CM | POA: Insufficient documentation

## 2012-12-19 DIAGNOSIS — G40909 Epilepsy, unspecified, not intractable, without status epilepticus: Secondary | ICD-10-CM | POA: Insufficient documentation

## 2012-12-19 DIAGNOSIS — F17200 Nicotine dependence, unspecified, uncomplicated: Secondary | ICD-10-CM | POA: Insufficient documentation

## 2012-12-19 DIAGNOSIS — Z95818 Presence of other cardiac implants and grafts: Secondary | ICD-10-CM | POA: Insufficient documentation

## 2012-12-19 DIAGNOSIS — K029 Dental caries, unspecified: Secondary | ICD-10-CM | POA: Insufficient documentation

## 2012-12-19 DIAGNOSIS — S025XXA Fracture of tooth (traumatic), initial encounter for closed fracture: Secondary | ICD-10-CM | POA: Insufficient documentation

## 2012-12-19 DIAGNOSIS — Y939 Activity, unspecified: Secondary | ICD-10-CM | POA: Insufficient documentation

## 2012-12-19 DIAGNOSIS — R51 Headache: Secondary | ICD-10-CM | POA: Insufficient documentation

## 2012-12-19 DIAGNOSIS — Y929 Unspecified place or not applicable: Secondary | ICD-10-CM | POA: Insufficient documentation

## 2012-12-19 DIAGNOSIS — G8929 Other chronic pain: Secondary | ICD-10-CM | POA: Insufficient documentation

## 2012-12-19 MED ORDER — PENICILLIN V POTASSIUM 500 MG PO TABS: 500.0000 mg | ORAL_TABLET | Freq: Three times a day (TID) | ORAL | Status: DC

## 2012-12-19 MED ORDER — OXYCODONE-ACETAMINOPHEN 5-325 MG PO TABS
2.0000 | ORAL_TABLET | Freq: Once | ORAL | Status: AC
  Administered 2012-12-19: 2 via ORAL
  Filled 2012-12-19: qty 2

## 2012-12-19 MED ORDER — OXYCODONE-ACETAMINOPHEN 5-325 MG PO TABS: 1.0000 | ORAL_TABLET | Freq: Four times a day (QID) | ORAL | Status: DC | PRN

## 2012-12-19 NOTE — ED Provider Notes (Signed)
CSN: [id number]     Arrival date & time 12/19/12  1751 History  This chart was scribed for non-physician practitioner Marlon Pel, PA-C, working with Roney Marion, MD by Dorothey Baseman, ED Scribe. This patient was seen in room WTR5/WTR5 and the patient's care was started at 6:28 PM.    Chief Complaint  Patient presents with  . Dental Pain   The history is provided by the patient. No language interpreter was used.   HPI Comments: Michael [name] is a 52 y.o. male who presents to the Emergency Department complaining of a constant pain to the bilateral upper and lower dentition onset a few months ago that has been progressively worsening after some dental fractures. He reports an associated headache, ear pain, eye pain, and dizziness secondary to the pain. He denies taking any medications at home to treat his symptoms. Patient reports that he does not have dental insurance. Patient has a history of seizure disorder.   Past Medical History  Diagnosis Date  . 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 Bradycardia   . Tobacco abuse   . Chronic chest pain   . Seizure disorder   . History of nuclear stress test 06/14/2011    lexiscan; mild perfusion defect in basal inferosetpal & apical inferior region; negative for ischemia    Past Surgical History  Procedure Laterality Date  . Pacemaker insertion  04/23/2003    Medtronic Kappa; [facility] - symptomatic bradycardia  . Cardiac catheterization  2002, 2003    normal coronaries  . Transthoracic echocardiogram  05/14/2012    EF 50-55%, normal systolic function; mild MR (ordered for mitral valve disease 424.0)  . Hand surgery  1998    x3  . Eye surgery  [age] y.o.  . Gallbladder surgery  2003   Family History  Problem Relation Age of Onset  . Heart Problems Mother     MVP  . CAD Father   . Heart Problems Other     aunts x 2 died of heart problems   . Heart attack Maternal Grandmother   .  Hypertension Maternal Grandfather   . Heart attack Paternal Grandmother   . Heart attack Paternal Grandfather    History  Substance Use Topics  . Smoking status: Current Every Day Smoker -- 1.50 packs/day for 40 years  . Smokeless tobacco: Not on file  . Alcohol Use: Not on file    Review of Systems  HENT: Positive for dental problem and ear pain.   Eyes: Positive for pain.  Neurological: Positive for dizziness and headaches.  All other systems reviewed and are negative.    Allergies  Shellfish allergy  Home Medications   Current Outpatient Rx  Name  Route  Sig  Dispense  Refill  . albuterol (PROVENTIL HFA;VENTOLIN HFA) 108 (90 BASE) MCG/ACT inhaler   Inhalation   Inhale 2 puffs into the lungs every 6 (six) hours as needed for wheezing.         . carbamazepine (CARBATROL) 200 MG 12 hr capsule   Oral   Take 200 mg by mouth 2 (two) times daily.         [name] FLUoxetine (PROZAC) 20 MG capsule   Oral   Take 20 mg by mouth 2 (two) times daily.         . Fluticasone-Salmeterol (ADVAIR) 100-50 MCG/DOSE AEPB   Inhalation   Inhale 1 puff into the lungs every 12 (twelve) hours.         [name]  ipratropium (ATROVENT HFA) 17 MCG/ACT inhaler   Inhalation   Inhale 2 puffs into the lungs every 6 (six) hours.         . naproxen sodium (ANAPROX) 220 MG tablet   Oral   Take 440 mg by mouth 3 (three) times daily as needed (pain).         [name] omeprazole (PRILOSEC) 20 MG capsule   Oral   Take 20 mg by mouth 2 (two) times daily.          [name] oxyCODONE-acetaminophen (PERCOCET/ROXICET) 5-325 MG per tablet   Oral   Take 1-2 tablets by mouth every 6 (six) hours as needed for severe pain.   20 tablet   0   . penicillin v potassium (VEETID) 500 MG tablet   Oral   Take 1 tablet (500 mg total) by mouth 3 (three) times daily.   30 tablet   0    Triage Vitals: BP 115/80  Pulse 61  Temp(Src) 98.1 F (36.7 C) (Oral)  Resp 16  SpO2 97%  Physical Exam  Nursing note and vitals  reviewed. Constitutional: He is oriented to person, place, and time. He appears well-developed and well-nourished. No distress.  HENT:  Head: Normocephalic and atraumatic.  Widespread dental decay and multiple dental fractures.   Eyes: Conjunctivae are normal.  Neck: Normal range of motion. Neck supple.  Pulmonary/Chest: Effort normal. No respiratory distress.  Abdominal: He exhibits no distension.  Musculoskeletal: Normal range of motion.  Neurological: He is alert and oriented to person, place, and time.  Skin: Skin is warm and dry.  Psychiatric: He has a normal mood and affect. His behavior is normal.    ED Course  Procedures (including critical care time)  DIAGNOSTIC STUDIES: Oxygen Saturation is 97% on room air, normal by my interpretation.    COORDINATION OF CARE: 6:31 PM- Will discharge patient with antibiotics and pain medication to manage symptoms. Advised patient to follow up with the referred dentist. Discussed treatment plan with patient at bedside and patient verbalized agreement.     Labs Review Labs Reviewed - No data to display Imaging Review No results found.  EKG Interpretation   None       MDM   1. Dental decay    Patient has dental pain. No emergent s/sx's present. Patent airway. No trismus.  Will be given pain medication and antibiotics. I discussed the need to call dentist within 24/48 hours for follow-up. Dental referral given. Return to ED precautions given.  Pt voiced understanding and has agreed to follow-up.   52 y.o.[name]'s evaluation in the Emergency Department is complete. It has been determined that no acute conditions requiring further emergency intervention are present at this time. The patient/guardian have been advised of the diagnosis and plan. We have discussed signs and symptoms that warrant return to the ED, such as changes or worsening in symptoms.  Vital signs are stable at discharge. Filed Vitals:   12/19/12 1815  BP:  115/80  Pulse: 61  Temp: 98.1 F (36.7 C)  Resp: 16    Patient/guardian has voiced understanding and agreed to follow-up with the PCP or specialist.  I personally performed the services described in this documentation, which was scribed in my presence. The recorded information has been reviewed and is accurate.    Dorthula Matas, PA-C 12/19/12 1836

## 2012-12-19 NOTE — ED Notes (Signed)
Pt c/o dental pain to both sides of mouth. States that all his teeth are bad and he does not have dental insurance. Pt states now pain in his jaw and he has a headache. Pt with no acute distress.

## 2012-12-19 NOTE — ED Notes (Signed)
Pt has a ride home.  

## 2012-12-22 NOTE — ED Provider Notes (Signed)
Medical screening examination/treatment/procedure(s) were performed by non-physician practitioner and as supervising physician I was immediately available for consultation/collaboration.  EKG Interpretation   None         Kayelyn Lemon J Joee Iovine, MD 12/22/12 0723 

## 2013-01-02 ENCOUNTER — Encounter: Payer: Self-pay | Admitting: Cardiovascular Disease

## 2013-04-15 ENCOUNTER — Telehealth: Payer: Self-pay | Admitting: *Deleted

## 2013-04-15 NOTE — Telephone Encounter (Signed)
Returned call and pt verified x 2 w/ Steward DroneBrenda, pt's mother.  Informed per MD and verbalized understanding.

## 2013-04-15 NOTE — Telephone Encounter (Signed)
Returning your call. °

## 2013-04-15 NOTE — Telephone Encounter (Signed)
Pt's mother called and wanted to know if he needed to take an antibiotic before he goes to see the dentist tomorrow. She is concerned because he has a pacemaker.  MC

## 2013-04-15 NOTE — Telephone Encounter (Signed)
No need to take an antibiotic before dental procedures (or any other surgery) with a pacemaker

## 2013-04-15 NOTE — Telephone Encounter (Signed)
Message forwarded to [name].  

## 2013-04-15 NOTE — Telephone Encounter (Signed)
Returned call.  Left message to call back before 4pm.  

## 2013-06-13 ENCOUNTER — Inpatient Hospital Stay (HOSPITAL_COMMUNITY)
Admission: EM | Admit: 2013-06-13 | Discharge: 2013-06-24 | DRG: 871 | Disposition: A | Discharge status: Home Health | Payer: Medicare HMO | Attending: Internal Medicine | Admitting: Internal Medicine

## 2013-06-13 ENCOUNTER — Emergency Department (HOSPITAL_COMMUNITY): Payer: Medicare HMO

## 2013-06-13 ENCOUNTER — Encounter (HOSPITAL_COMMUNITY): Payer: Self-pay | Admitting: Emergency Medicine

## 2013-06-13 DIAGNOSIS — A419 Sepsis, unspecified organism: Secondary | ICD-10-CM

## 2013-06-13 DIAGNOSIS — I959 Hypotension, unspecified: Secondary | ICD-10-CM

## 2013-06-13 DIAGNOSIS — G40909 Epilepsy, unspecified, not intractable, without status epilepticus: Secondary | ICD-10-CM | POA: Diagnosis present

## 2013-06-13 DIAGNOSIS — R509 Fever, unspecified: Secondary | ICD-10-CM

## 2013-06-13 DIAGNOSIS — J984 Other disorders of lung: Secondary | ICD-10-CM

## 2013-06-13 DIAGNOSIS — N390 Urinary tract infection, site not specified: Secondary | ICD-10-CM

## 2013-06-13 DIAGNOSIS — J189 Pneumonia, unspecified organism: Secondary | ICD-10-CM | POA: Diagnosis present

## 2013-06-13 DIAGNOSIS — E43 Unspecified severe protein-calorie malnutrition: Secondary | ICD-10-CM

## 2013-06-13 DIAGNOSIS — J852 Abscess of lung without pneumonia: Secondary | ICD-10-CM | POA: Diagnosis present

## 2013-06-13 DIAGNOSIS — R634 Abnormal weight loss: Secondary | ICD-10-CM

## 2013-06-13 DIAGNOSIS — R001 Bradycardia, unspecified: Secondary | ICD-10-CM

## 2013-06-13 DIAGNOSIS — J441 Chronic obstructive pulmonary disease with (acute) exacerbation: Secondary | ICD-10-CM

## 2013-06-13 DIAGNOSIS — F3289 Other specified depressive episodes: Secondary | ICD-10-CM | POA: Diagnosis present

## 2013-06-13 DIAGNOSIS — Z418 Encounter for other procedures for purposes other than remedying health state: Secondary | ICD-10-CM

## 2013-06-13 DIAGNOSIS — Z950 Presence of cardiac pacemaker: Secondary | ICD-10-CM

## 2013-06-13 DIAGNOSIS — R079 Chest pain, unspecified: Secondary | ICD-10-CM | POA: Diagnosis present

## 2013-06-13 DIAGNOSIS — IMO0002 Reserved for concepts with insufficient information to code with codable children: Secondary | ICD-10-CM

## 2013-06-13 DIAGNOSIS — F17200 Nicotine dependence, unspecified, uncomplicated: Secondary | ICD-10-CM | POA: Diagnosis present

## 2013-06-13 DIAGNOSIS — Z2989 Encounter for other specified prophylactic measures: Secondary | ICD-10-CM

## 2013-06-13 DIAGNOSIS — Z79899 Other long term (current) drug therapy: Secondary | ICD-10-CM

## 2013-06-13 DIAGNOSIS — F329 Major depressive disorder, single episode, unspecified: Secondary | ICD-10-CM | POA: Diagnosis present

## 2013-06-13 HISTORY — DX: Major depressive disorder, single episode, unspecified: F32.9

## 2013-06-13 HISTORY — DX: Pneumonia, unspecified organism: J18.9

## 2013-06-13 HISTORY — DX: Presence of cardiac pacemaker: Z95.0

## 2013-06-13 HISTORY — DX: Depression, unspecified: F32.A

## 2013-06-13 HISTORY — DX: Angina pectoris, unspecified: I20.9

## 2013-06-13 HISTORY — DX: Chronic obstructive pulmonary disease, unspecified: J44.9

## 2013-06-13 HISTORY — DX: Shortness of breath: R06.02

## 2013-06-13 LAB — HEPATIC FUNCTION PANEL
ALBUMIN: 2.2 g/dL — AB (ref 3.5–5.2)
ALK PHOS: 350 U/L — AB (ref 39–117)
ALT: 31 U/L (ref 0–53)
AST: 53 U/L — ABNORMAL HIGH (ref 0–37)
BILIRUBIN INDIRECT: 0.8 mg/dL (ref 0.3–0.9)
Bilirubin, Direct: 0.9 mg/dL — ABNORMAL HIGH (ref 0.0–0.3)
TOTAL PROTEIN: 6.6 g/dL (ref 6.0–8.3)
Total Bilirubin: 1.7 mg/dL — ABNORMAL HIGH (ref 0.3–1.2)

## 2013-06-13 LAB — URINALYSIS, ROUTINE W REFLEX MICROSCOPIC
GLUCOSE, UA: NEGATIVE mg/dL
Hgb urine dipstick: NEGATIVE
KETONES UR: 15 mg/dL — AB
NITRITE: POSITIVE — AB
PH: 5.5 (ref 5.0–8.0)
Protein, ur: 30 mg/dL — AB
SPECIFIC GRAVITY, URINE: 1.027 (ref 1.005–1.030)
Urobilinogen, UA: 4 mg/dL — ABNORMAL HIGH (ref 0.0–1.0)

## 2013-06-13 LAB — URINE MICROSCOPIC-ADD ON

## 2013-06-13 LAB — CBC
HEMATOCRIT: 32.8 % — AB (ref 39.0–52.0)
Hemoglobin: 11.4 g/dL — ABNORMAL LOW (ref 13.0–17.0)
MCH: 33.4 pg (ref 26.0–34.0)
MCHC: 34.8 g/dL (ref 30.0–36.0)
MCV: 96.2 fL (ref 78.0–100.0)
PLATELETS: 257 10*3/uL (ref 150–400)
RBC: 3.41 MIL/uL — ABNORMAL LOW (ref 4.22–5.81)
RDW: 12.3 % (ref 11.5–15.5)
WBC: 16.6 10*3/uL — AB (ref 4.0–10.5)

## 2013-06-13 LAB — RAPID HIV SCREEN (WH-MAU): Rapid HIV Screen: NONREACTIVE

## 2013-06-13 LAB — BASIC METABOLIC PANEL
BUN: 19 mg/dL (ref 6–23)
CHLORIDE: 87 meq/L — AB (ref 96–112)
CO2: 24 mEq/L (ref 19–32)
Calcium: 8.3 mg/dL — ABNORMAL LOW (ref 8.4–10.5)
Creatinine, Ser: 0.95 mg/dL (ref 0.50–1.35)
Glucose, Bld: 118 mg/dL — ABNORMAL HIGH (ref 70–99)
POTASSIUM: 3.6 meq/L — AB (ref 3.7–5.3)
SODIUM: 128 meq/L — AB (ref 137–147)

## 2013-06-13 LAB — RAPID URINE DRUG SCREEN, HOSP PERFORMED
Amphetamines: NOT DETECTED
Barbiturates: NOT DETECTED
Benzodiazepines: NOT DETECTED
COCAINE: NOT DETECTED
OPIATES: NOT DETECTED
TETRAHYDROCANNABINOL: NOT DETECTED

## 2013-06-13 LAB — PRO B NATRIURETIC PEPTIDE: Pro B Natriuretic peptide (BNP): 235.4 pg/mL — ABNORMAL HIGH (ref 0–125)

## 2013-06-13 LAB — I-STAT TROPONIN, ED
TROPONIN I, POC: 0.01 ng/mL (ref 0.00–0.08)
Troponin i, poc: 0.01 ng/mL (ref 0.00–0.08)

## 2013-06-13 LAB — D-DIMER, QUANTITATIVE (NOT AT ARMC)

## 2013-06-13 LAB — I-STAT CG4 LACTIC ACID, ED
LACTIC ACID, VENOUS: 0.9 mmol/L (ref 0.5–2.2)
LACTIC ACID, VENOUS: 1.08 mmol/L (ref 0.5–2.2)

## 2013-06-13 LAB — PROTIME-INR
INR: 1.17 (ref 0.00–1.49)
Prothrombin Time: 14.7 seconds (ref 11.6–15.2)

## 2013-06-13 MED ORDER — SODIUM CHLORIDE 0.9 % IV SOLN
INTRAVENOUS | Status: DC
  Administered 2013-06-14 – 2013-06-15 (×3): via INTRAVENOUS

## 2013-06-13 MED ORDER — IOHEXOL 300 MG/ML  SOLN
25.0000 mL | Freq: Once | INTRAMUSCULAR | Status: AC | PRN
  Administered 2013-06-13: 25 mL via ORAL

## 2013-06-13 MED ORDER — ACETAMINOPHEN 325 MG PO TABS
650.0000 mg | ORAL_TABLET | Freq: Once | ORAL | Status: AC
  Administered 2013-06-13: 650 mg via ORAL
  Filled 2013-06-13: qty 2

## 2013-06-13 MED ORDER — IOHEXOL 300 MG/ML  SOLN
100.0000 mL | Freq: Once | INTRAMUSCULAR | Status: AC | PRN
  Administered 2013-06-13: 100 mL via INTRAVENOUS

## 2013-06-13 MED ORDER — SODIUM CHLORIDE 0.9 % IV BOLUS (SEPSIS)
250.0000 mL | Freq: Once | INTRAVENOUS | Status: AC
  Administered 2013-06-13: 250 mL via INTRAVENOUS

## 2013-06-13 MED ORDER — DEXTROSE 5 % IV SOLN
1.0000 g | Freq: Once | INTRAVENOUS | Status: AC
  Administered 2013-06-13: 1 g via INTRAVENOUS
  Filled 2013-06-13: qty 10

## 2013-06-13 MED ORDER — ACETAMINOPHEN 650 MG RE SUPP: 650.0000 mg | RECTAL | Status: DC | PRN

## 2013-06-13 NOTE — ED Notes (Signed)
PT from [facility]. Sent for eval of pelvis pain, cp and fever. Pt states cp comes and goes, pelvis pain is constant. Per EMS, pt UA from [facility] showed UTI.

## 2013-06-13 NOTE — ED Notes (Signed)
Pacemaker interrogated. 

## 2013-06-13 NOTE — ED Notes (Signed)
Pt experiencing episode of tachycardia with HR in the 150s, pt diaphoretic at this time, EKG performed and Dr. Deretha Emory updated on pt condition.

## 2013-06-13 NOTE — ED Notes (Signed)
Medtronic report given to Dr. Deretha Emory

## 2013-06-14 ENCOUNTER — Encounter (HOSPITAL_COMMUNITY): Payer: Self-pay | Admitting: *Deleted

## 2013-06-14 DIAGNOSIS — I959 Hypotension, unspecified: Secondary | ICD-10-CM

## 2013-06-14 DIAGNOSIS — J189 Pneumonia, unspecified organism: Secondary | ICD-10-CM | POA: Diagnosis present

## 2013-06-14 DIAGNOSIS — R634 Abnormal weight loss: Secondary | ICD-10-CM

## 2013-06-14 DIAGNOSIS — E43 Unspecified severe protein-calorie malnutrition: Secondary | ICD-10-CM | POA: Diagnosis present

## 2013-06-14 LAB — GLUCOSE, CAPILLARY: Glucose-Capillary: 141 mg/dL — ABNORMAL HIGH (ref 70–99)

## 2013-06-14 LAB — MRSA PCR SCREENING: MRSA BY PCR: NEGATIVE

## 2013-06-14 MED ORDER — SODIUM CHLORIDE 0.9 % IV SOLN: INTRAVENOUS | Status: DC

## 2013-06-14 MED ORDER — ALBUTEROL SULFATE (2.5 MG/3ML) 0.083% IN NEBU
2.5000 mg | INHALATION_SOLUTION | RESPIRATORY_TRACT | Status: DC | PRN
  Administered 2013-06-16 – 2013-06-21 (×10): 2.5 mg via RESPIRATORY_TRACT
  Filled 2013-06-14 (×11): qty 3

## 2013-06-14 MED ORDER — ALBUTEROL SULFATE (2.5 MG/3ML) 0.083% IN NEBU
2.5000 mg | INHALATION_SOLUTION | Freq: Four times a day (QID) | RESPIRATORY_TRACT | Status: DC | PRN
  Administered 2013-06-14: 2.5 mg via RESPIRATORY_TRACT
  Filled 2013-06-14: qty 3

## 2013-06-14 MED ORDER — PANTOPRAZOLE SODIUM 40 MG PO TBEC
40.0000 mg | DELAYED_RELEASE_TABLET | Freq: Every day | ORAL | Status: DC
  Administered 2013-06-14 – 2013-06-24 (×11): 40 mg via ORAL
  Filled 2013-06-14 (×12): qty 1

## 2013-06-14 MED ORDER — ACETAMINOPHEN 325 MG PO TABS
650.0000 mg | ORAL_TABLET | Freq: Once | ORAL | Status: AC
  Administered 2013-06-14: 650 mg via ORAL
  Filled 2013-06-14: qty 2

## 2013-06-14 MED ORDER — KETOROLAC TROMETHAMINE 30 MG/ML IJ SOLN
30.0000 mg | Freq: Once | INTRAMUSCULAR | Status: AC
  Administered 2013-06-14: 30 mg via INTRAVENOUS
  Filled 2013-06-14: qty 1

## 2013-06-14 MED ORDER — KETOROLAC TROMETHAMINE 30 MG/ML IJ SOLN
30.0000 mg | Freq: Four times a day (QID) | INTRAMUSCULAR | Status: AC | PRN
  Administered 2013-06-15 – 2013-06-16 (×3): 30 mg via INTRAVENOUS
  Filled 2013-06-14 (×3): qty 1

## 2013-06-14 MED ORDER — CARBAMAZEPINE ER 200 MG PO TB12
200.0000 mg | ORAL_TABLET | Freq: Two times a day (BID) | ORAL | Status: DC
  Administered 2013-06-14 – 2013-06-24 (×21): 200 mg via ORAL
  Filled 2013-06-14 (×24): qty 1

## 2013-06-14 MED ORDER — ENSURE COMPLETE PO LIQD
237.0000 mL | Freq: Two times a day (BID) | ORAL | Status: DC
  Administered 2013-06-14: 237 mL via ORAL

## 2013-06-14 MED ORDER — IPRATROPIUM BROMIDE HFA 17 MCG/ACT IN AERS: 2.0000 | INHALATION_SPRAY | Freq: Two times a day (BID) | RESPIRATORY_TRACT | Status: DC

## 2013-06-14 MED ORDER — SODIUM CHLORIDE 0.9 % IV SOLN
3.0000 g | Freq: Four times a day (QID) | INTRAVENOUS | Status: DC
  Administered 2013-06-14 – 2013-06-16 (×9): 3 g via INTRAVENOUS
  Filled 2013-06-14 (×13): qty 3

## 2013-06-14 MED ORDER — DEXTROSE 5 % IV SOLN: 1.0000 g | INTRAVENOUS | Status: DC

## 2013-06-14 MED ORDER — FLUOXETINE HCL 20 MG PO CAPS
20.0000 mg | ORAL_CAPSULE | Freq: Two times a day (BID) | ORAL | Status: DC
  Administered 2013-06-14 – 2013-06-24 (×21): 20 mg via ORAL
  Filled 2013-06-14 (×24): qty 1

## 2013-06-14 MED ORDER — ALBUTEROL SULFATE HFA 108 (90 BASE) MCG/ACT IN AERS: 2.0000 | INHALATION_SPRAY | Freq: Four times a day (QID) | RESPIRATORY_TRACT | Status: DC | PRN

## 2013-06-14 MED ORDER — PNEUMOCOCCAL VAC POLYVALENT 25 MCG/0.5ML IJ INJ
0.5000 mL | INJECTION | INTRAMUSCULAR | Status: AC
  Administered 2013-06-15: 0.5 mL via INTRAMUSCULAR
  Filled 2013-06-14 (×2): qty 0.5

## 2013-06-14 MED ORDER — MOMETASONE FURO-FORMOTEROL FUM 100-5 MCG/ACT IN AERO
2.0000 | INHALATION_SPRAY | Freq: Two times a day (BID) | RESPIRATORY_TRACT | Status: DC
  Administered 2013-06-14 – 2013-06-24 (×21): 2 via RESPIRATORY_TRACT
  Filled 2013-06-14: qty 8.8

## 2013-06-14 MED ORDER — IPRATROPIUM BROMIDE HFA 17 MCG/ACT IN AERS: 2.0000 | INHALATION_SPRAY | Freq: Four times a day (QID) | RESPIRATORY_TRACT | Status: DC

## 2013-06-14 MED ORDER — BOOST / RESOURCE BREEZE PO LIQD
1.0000 | Freq: Three times a day (TID) | ORAL | Status: DC
  Administered 2013-06-14 – 2013-06-20 (×12): 1 via ORAL

## 2013-06-14 MED ORDER — VANCOMYCIN HCL IN DEXTROSE 750-5 MG/150ML-% IV SOLN
750.0000 mg | Freq: Two times a day (BID) | INTRAVENOUS | Status: DC
  Administered 2013-06-14 – 2013-06-16 (×5): 750 mg via INTRAVENOUS
  Filled 2013-06-14 (×7): qty 150

## 2013-06-14 MED ORDER — DEXTROSE 5 % IV SOLN
500.0000 mg | Freq: Once | INTRAVENOUS | Status: AC
  Administered 2013-06-14: 500 mg via INTRAVENOUS
  Filled 2013-06-14: qty 500

## 2013-06-14 MED ORDER — DEXTROSE 5 % IV SOLN: 500.0000 mg | INTRAVENOUS | Status: DC

## 2013-06-14 MED ORDER — IPRATROPIUM BROMIDE 0.02 % IN SOLN
0.5000 mg | Freq: Two times a day (BID) | RESPIRATORY_TRACT | Status: DC
  Administered 2013-06-15 – 2013-06-20 (×12): 0.5 mg via RESPIRATORY_TRACT
  Filled 2013-06-14 (×12): qty 2.5

## 2013-06-14 MED ORDER — IPRATROPIUM BROMIDE 0.02 % IN SOLN
0.5000 mg | Freq: Four times a day (QID) | RESPIRATORY_TRACT | Status: DC
  Administered 2013-06-14 (×3): 0.5 mg via RESPIRATORY_TRACT
  Filled 2013-06-14 (×3): qty 2.5

## 2013-06-14 MED ORDER — ACETAMINOPHEN 325 MG PO TABS
650.0000 mg | ORAL_TABLET | ORAL | Status: DC | PRN
  Administered 2013-06-14 – 2013-06-24 (×12): 650 mg via ORAL
  Filled 2013-06-14 (×12): qty 2

## 2013-06-14 MED ORDER — HEPARIN SODIUM (PORCINE) 5000 UNIT/ML IJ SOLN
5000.0000 [IU] | Freq: Three times a day (TID) | INTRAMUSCULAR | Status: DC
  Administered 2013-06-14 – 2013-06-24 (×30): 5000 [IU] via SUBCUTANEOUS
  Filled 2013-06-14 (×34): qty 1

## 2013-06-14 NOTE — Progress Notes (Signed)
Pt arrived to 4N32. A&Ox4 and assessment completed. Pt oriented to unit and room. Pt denies pain and resting in comfortably in bed. Will continue to monitor.

## 2013-06-14 NOTE — H&P (Signed)
[facility] History and Physical  Michael [name] UYQ:[id number] DOB: 01/18/1960 DOA: 06/13/2013  Referring physician: EDP PCP: PROVIDER NOT IN SYSTEM   Chief Complaint: Pelvic pain   HPI: Michael [name] is a 53 y.o. male who presents to the ED for eval of chronic pelvic pain, chest pain, and fever.  CP comes and goes, is associated with cough, has been going on for at least a couple of months.  Patient also reports weight loss over the past few months and night sweats as well as fever.  Pelvic pain is chronic and has been going on "for a long time".  Review of Systems: Systems reviewed.  As above, otherwise negative  Past Medical History  Diagnosis Date  . 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 Bradycardia   . Tobacco abuse   . Chronic chest pain   . Seizure disorder   . History of nuclear stress test 06/14/2011    lexiscan; mild perfusion defect in basal inferosetpal & apical inferior region; negative for ischemia    Past Surgical History  Procedure Laterality Date  . Pacemaker insertion  04/23/2003    [employer] Kappa; [facility] - symptomatic bradycardia  . Cardiac catheterization  2002, 2003    normal coronaries  . Transthoracic echocardiogram  05/14/2012    EF 50-55%, normal systolic function; mild MR (ordered for mitral valve disease 424.0)  . Hand surgery  1998    x3  . Eye surgery  [age] y.o.  . Gallbladder surgery  2003   Social History:  reports that he has been smoking.  He does not have any smokeless tobacco history on file. His alcohol and drug histories are not on file.  Allergies  Allergen Reactions  . Shellfish Allergy Nausea And Vomiting    Family History  Problem Relation Age of Onset  . Heart Problems Mother     MVP  . CAD Father   . Heart Problems Other     aunts x 2 died of heart problems   . Heart attack Maternal Grandmother   . Hypertension Maternal Grandfather   . Heart attack Paternal  Grandmother   . Heart attack Paternal Grandfather      Prior to Admission medications   Medication Sig Start Date End Date Taking? Authorizing Provider  albuterol (PROVENTIL HFA;VENTOLIN HFA) 108 (90 BASE) MCG/ACT inhaler Inhale 2 puffs into the lungs every 6 (six) hours as needed for wheezing.   Yes Historical Provider, MD  carbamazepine (CARBATROL) 200 MG 12 hr capsule Take 200 mg by mouth 2 (two) times daily.   Yes Historical Provider, MD  FLUoxetine (PROZAC) 20 MG capsule Take 20 mg by mouth 2 (two) times daily.   Yes Historical Provider, MD  Fluticasone-Salmeterol (ADVAIR) 100-50 MCG/DOSE AEPB Inhale 1 puff into the lungs every 12 (twelve) hours.   Yes Historical Provider, MD  ipratropium (ATROVENT HFA) 17 MCG/ACT inhaler Inhale 2 puffs into the lungs every 6 (six) hours.   Yes Historical Provider, MD  naproxen sodium (ANAPROX) 220 MG tablet Take 440 mg by mouth 3 (three) times daily as needed (pain).   Yes Historical Provider, MD  omeprazole (PRILOSEC) 20 MG capsule Take 20 mg by mouth 2 (two) times daily.    Yes Historical Provider, MD   Physical Exam: Filed Vitals:   06/14/13 0030  BP: 100/57  Pulse: 68  Temp:   Resp: 7    BP 100/57  Pulse 68  Temp(Src)  98.1 F (36.7 C) (Oral)  Resp 7  SpO2 100%  General Appearance:    Alert, oriented, no distress, appears stated age  Head:    Normocephalic, atraumatic  Eyes:    PERRL, EOMI, sclera non-icteric        Nose:   Nares without drainage or epistaxis. Mucosa, turbinates normal  Throat:   Moist mucous membranes. Oropharynx without erythema or exudate.  Neck:   Supple. No carotid bruits.  No thyromegaly.  No lymphadenopathy.   Back:     No CVA tenderness, no spinal tenderness  Lungs:     Clear to auscultation bilaterally, without wheezes, rhonchi or rales  Chest wall:    No tenderness to palpitation  Heart:    Regular rate and rhythm without murmurs, gallops, rubs  Abdomen:     Soft, non-tender, nondistended, normal bowel  sounds, no organomegaly  Genitalia:    deferred  Rectal:    deferred  Extremities:   No clubbing, cyanosis or edema.  Pulses:   2+ and symmetric all extremities  Skin:   Skin color, texture, turgor normal, no rashes or lesions  Lymph nodes:   Cervical, supraclavicular, and axillary nodes normal  Neurologic:   CNII-XII intact. Normal strength, sensation and reflexes      throughout    Labs on Admission:  Basic Metabolic Panel:  Recent Labs Lab 06/13/13 1855  NA 128*  K 3.6*  CL 87*  CO2 24  GLUCOSE 118*  BUN 19  CREATININE 0.95  CALCIUM 8.3*   Liver Function Tests:  Recent Labs Lab 06/13/13 2032  AST 53*  ALT 31  ALKPHOS 350*  BILITOT 1.7*  PROT 6.6  ALBUMIN 2.2*   No results found for this basename: LIPASE, AMYLASE,  in the last 168 hours No results found for this basename: AMMONIA,  in the last 168 hours CBC:  Recent Labs Lab 06/13/13 1855  WBC 16.6*  HGB 11.4*  HCT 32.8*  MCV 96.2  PLT 257   Cardiac Enzymes: No results found for this basename: CKTOTAL, CKMB, CKMBINDEX, TROPONINI,  in the last 168 hours  BNP (last 3 results)  Recent Labs  06/13/13 2032  PROBNP 235.4*   CBG: No results found for this basename: GLUCAP,  in the last 168 hours  Radiological Exams on Admission: Ct Chest W Contrast  06/14/2013   CLINICAL DATA:  Chest pain, lower abdominal pain,  EXAM: CT CHEST, ABDOMEN, AND PELVIS WITH CONTRAST  TECHNIQUE: Multidetector CT imaging of the chest, abdomen and pelvis was performed following the standard protocol during bolus administration of intravenous contrast.  CONTRAST:  100mL OMNIPAQUE IOHEXOL 300 MG/ML  SOLN  COMPARISON:  Chest x-ray earlier today. CT abdomen and pelvis 07/08/2010  FINDINGS: CT CHEST FINDINGS  Normal cardiac size. Permanent transvenous pacer good position. Good opacification of the arch and great vessels as well as central pulmonary arteries reveals no abnormality.  No suspicious pulmonary nodules. No hilar or  mediastinal adenopathy.  There is severe emphysematous change with large bilateral apical air cysts. On the left there is a superimposed acute pneumonia with many of the cysts filled with fluid. Consolidation is seen more inferiorly and posteriorly the left upper lobe. Some infiltrate extends into the lingula. There is no effusion. There is no pneumothorax. Osseous structures unremarkable.  CT ABDOMEN AND PELVIS FINDINGS  BODY WALL: Negative.  LOWER CHEST: Unremarkable.  ABDOMEN/PELVIS:  Liver: No focal abnormality.  Biliary: No evidence of biliary obstruction or [place]. Status post cholecystectomy.  Pancreas:  Unremarkable.  Spleen: Unremarkable.  Adrenals: Unremarkable.  Kidneys and ureters: No hydronephrosis or [place].  Bladder: Unremarkable.  Reproductive: Unremarkable.  Bowel: No obstruction. Normal appendix.  Retroperitoneum: No mass or adenopathy.  Peritoneum: No free fluid or gas.  Vascular: No acute abnormality.  Moderate calcification.  OSSEOUS: No acute abnormalities.  IMPRESSION: Severe COPD with acute left upper lobe and lingular pneumonia.  No acute intra-abdominal or pelvic findings.   Electronically Signed   By: Davonna Belling M.D.   On: 06/14/2013 00:03   Ct Abdomen Pelvis W Contrast  06/14/2013   CLINICAL DATA:  Chest pain, lower abdominal pain,  EXAM: CT CHEST, ABDOMEN, AND PELVIS WITH CONTRAST  TECHNIQUE: Multidetector CT imaging of the chest, abdomen and pelvis was performed following the standard protocol during bolus administration of intravenous contrast.  CONTRAST:  OMNIPAQUE IOHEXOL 300 MG/ML  SOLN  COMPARISON:  Chest x-ray earlier today. CT abdomen and pelvis 07/08/2010  FINDINGS: CT CHEST FINDINGS  Normal cardiac size. Permanent transvenous pacer good position. Good opacification of the arch and great vessels as well as central pulmonary arteries reveals no abnormality.  No suspicious pulmonary nodules. No hilar or mediastinal adenopathy.  There is severe emphysematous change with  large bilateral apical air cysts. On the left there is a superimposed acute pneumonia with many of the cysts filled with fluid. Consolidation is seen more inferiorly and posteriorly the left upper lobe. Some infiltrate extends into the lingula. There is no effusion. There is no pneumothorax. Osseous structures unremarkable.  CT ABDOMEN AND PELVIS FINDINGS  BODY WALL: Negative.  LOWER CHEST: Unremarkable.  ABDOMEN/PELVIS:  Liver: No focal abnormality.  Biliary: No evidence of biliary obstruction or [place]. Status post cholecystectomy.  Pancreas: Unremarkable.  Spleen: Unremarkable.  Adrenals: Unremarkable.  Kidneys and ureters: No hydronephrosis or [place].  Bladder: Unremarkable.  Reproductive: Unremarkable.  Bowel: No obstruction. Normal appendix.  Retroperitoneum: No mass or adenopathy.  Peritoneum: No free fluid or gas.  Vascular: No acute abnormality.  Moderate calcification.  OSSEOUS: No acute abnormalities.  IMPRESSION: Severe COPD with acute left upper lobe and lingular pneumonia.  No acute intra-abdominal or pelvic findings.   Electronically Signed   By: Davonna Belling M.D.   On: 06/14/2013 00:03   Dg Chest Port 1 View  06/13/2013   CLINICAL DATA:  Chest pain and fever  EXAM: PORTABLE CHEST - 1 VIEW  COMPARISON:  09/06/2012  FINDINGS: Cardiac shadow is stable. A dual lead pacing device is again seen and stable. The lungs remain hyperinflated. Infiltrative changes noted in the left upper lobe. There is also some suggestion of cavitary component. Further evaluation by means of CT of the chest is recommended. The underlying bony structures are within normal limits.  IMPRESSION: Left upper lobe infiltrate with some suggestion of a cavitary component. CT is recommended for further evaluation.   Electronically Signed   By: Alcide Clever M.D.   On: 06/13/2013 20:32    EKG: Independently reviewed.  Assessment/Plan Active Problems:   CAP (community acquired pneumonia)   1. CAP - work up has revealed LUL CAP.   Given the presence of B symptoms and initial worry about a possible cavitary appearance on CXR (although admittedly CT chest looks less like a thick walled cavitary lesion).  Will add quantiferon gold assay to work up and keep patient on respiratory isolation as a precaution.  Treating with rocephin and azithromycin.  Blood pressures have been borderline low in the ED but improved with fluids, will put patient  in SDU for now.    Code Status: Full Code  Family Communication: Family at bedside Disposition Plan: Admit to inpatient   Time spent: 70 min  GARDNER, JARED M. [facility] Pager [phone]  If 7AM-7PM, please contact the day team taking care of the patient [email or web address] Password [email or web address] 06/14/2013, 1:00 AM

## 2013-06-14 NOTE — ED Provider Notes (Addendum)
CSN: [id number]     Arrival date & time 06/13/13  1808 History   First MD Initiated Contact with Patient 06/13/13 1817     Chief Complaint  Patient presents with  . Chest Pain  . Fever  . Pelvic Pain     (Consider location/radiation/quality/duration/timing/severity/associated sxs/prior Treatment) Patient is a 53 y.o. male presenting with chest pain, fever, and pelvic pain. The history is provided by the patient and the spouse.  Chest Pain Associated symptoms: abdominal pain, back pain, cough, diaphoresis, fatigue, fever and shortness of breath   Associated symptoms: no headache, no nausea and not vomiting   Fever Associated symptoms: chest pain, cough and dysuria   Associated symptoms: no confusion, no congestion, no headaches, no nausea, no rash and no vomiting   Pelvic Pain Associated symptoms include chest pain, abdominal pain and shortness of breath. Pertinent negatives include no headaches.   patient has been 6 since Memorial Day weekend had persisting cough prior to that. This is from old a week and has had left-sided chest pain fevers sweats significant shortness of breath decreased appetite weight loss. Also started complaining of pelvic pain. Patient was at [facility] urgent [facility] a urinalysis which showed evidence of a urinary tract infection patient was sent here for additional workup due to the chest complaints. Patient is a heavy smoker. Patient does not have a diagnosis formally of COPD but he is on albuterol and Atrovent.  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 [employer]tron[employer] type.    Past Medical History  Diagnosis Date  . 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 Bradycardia   . Tobacco abuse   . Chronic chest pain   . Seizure disorder   . History of nuclear stress test 06/14/2011    lexiscan; mild perfusion defect in basal inferosetpal & apical inferior region; negative for ischemia    Past Surgical History   Procedure Laterality Date  . Pacemaker insertion  04/23/2003    Medtronic Kappa; [facility] - symptomatic bradycardia  . Cardiac catheterization  2002, 2003    normal coronaries  . Transthoracic echocardiogram  05/14/2012    EF 50-55%, normal systolic function; mild MR (ordered for mitral valve disease 424.0)  . Hand surgery  1998    x3  . Eye surgery  [age] y.o.  . Gallbladder surgery  2003   Family History  Problem Relation Age of Onset  . Heart Problems Mother     MVP  . CAD Father   . Heart Problems Other     aunts x 2 died of heart problems   . Heart attack Maternal Grandmother   . Hypertension Maternal Grandfather   . Heart attack Paternal Grandmother   . Heart attack Paternal Grandfather    History  Substance Use Topics  . Smoking status: Current Every Day Smoker -- 1.50 packs/day for 40 years  . Smokeless tobacco: Not on file  . Alcohol Use: Not on file    Review of Systems  Constitutional: Positive for fever, diaphoresis, appetite change and fatigue.  HENT: Negative for congestion.   Respiratory: Positive for cough and shortness of breath.   Cardiovascular: Positive for chest pain.  Gastrointestinal: Positive for abdominal pain. Negative for nausea and vomiting.  Genitourinary: Positive for dysuria and pelvic pain.  Musculoskeletal: Positive for back pain.  Skin: Negative for rash.  Neurological: Negative for headaches.  Hematological: Does not bruise/bleed easily.  Psychiatric/Behavioral: Negative for confusion.  Allergies  Shellfish allergy  Home Medications   Prior to Admission medications   Medication Sig Start Date End Date Taking? Authorizing Provider  albuterol (PROVENTIL HFA;VENTOLIN HFA) 108 (90 BASE) MCG/ACT inhaler Inhale 2 puffs into the lungs every 6 (six) hours as needed for wheezing.   Yes Historical Provider, MD  carbamazepine (CARBATROL) 200 MG 12 hr capsule Take 200 mg by mouth 2 (two) times daily.   Yes  Historical Provider, MD  FLUoxetine (PROZAC) 20 MG capsule Take 20 mg by mouth 2 (two) times daily.   Yes Historical Provider, MD  Fluticasone-Salmeterol (ADVAIR) 100-50 MCG/DOSE AEPB Inhale 1 puff into the lungs every 12 (twelve) hours.   Yes Historical Provider, MD  ipratropium (ATROVENT HFA) 17 MCG/ACT inhaler Inhale 2 puffs into the lungs every 6 (six) hours.   Yes Historical Provider, MD  naproxen sodium (ANAPROX) 220 MG tablet Take 440 mg by mouth 3 (three) times daily as needed (pain).   Yes Historical Provider, MD  omeprazole (PRILOSEC) 20 MG capsule Take 20 mg by mouth 2 (two) times daily.    Yes Historical Provider, MD   BP 100/57  Pulse 68  Temp(Src) 98.1 F (36.7 C) (Oral)  Resp 7  SpO2 100% Physical Exam  Nursing note and vitals reviewed. Constitutional: He is oriented to person, place, and time. He appears well-developed. He appears distressed.  The patient then admits to weight loss.  HENT:  Head: Normocephalic and atraumatic.  Mucous membranes dry.  Eyes: Conjunctivae and EOM are normal. Pupils are equal, round, and reactive to light.  Neck: Normal range of motion.  Cardiovascular: Normal rate and regular rhythm.   No murmur heard. Pulmonary/Chest: Effort normal and breath sounds normal. He has no wheezes.  Abdominal: Soft. Bowel sounds are normal. There is no tenderness.  Musculoskeletal: Normal range of motion. He exhibits no edema.  Neurological: He is alert and oriented to person, place, and time. No cranial nerve deficit. He exhibits normal muscle tone. Coordination normal.  Skin: Skin is warm. No rash noted. He is diaphoretic.    ED Course  Procedures (including critical care time) Labs Review Labs Reviewed  CBC - Abnormal; Notable for the following:    WBC 16.6 (*)    RBC 3.41 (*)    Hemoglobin 11.4 (*)    HCT 32.8 (*)    All other components within normal limits  BASIC METABOLIC PANEL - Abnormal; Notable for the following:    Sodium 128 (*)     Potassium 3.6 (*)    Chloride 87 (*)    Glucose, Bld 118 (*)    Calcium 8.3 (*)    All other components within normal limits  PRO B NATRIURETIC PEPTIDE - Abnormal; Notable for the following:    Pro B Natriuretic peptide (BNP) 235.4 (*)    All other components within normal limits  HEPATIC FUNCTION PANEL - Abnormal; Notable for the following:    Albumin 2.2 (*)    AST 53 (*)    Alkaline Phosphatase 350 (*)    Total Bilirubin 1.7 (*)    Bilirubin, Direct 0.9 (*)    All other components within normal limits  URINALYSIS, ROUTINE W REFLEX MICROSCOPIC - Abnormal; Notable for the following:    Color, Urine ORANGE (*)    APPearance CLOUDY (*)    Bilirubin Urine LARGE (*)    Ketones, ur 15 (*)    Protein, ur 30 (*)    Urobilinogen, UA 4.0 (*)    Nitrite POSITIVE (*)  Leukocytes, UA SMALL (*)    All other components within normal limits  URINE MICROSCOPIC-ADD ON - Abnormal; Notable for the following:    Bacteria, UA FEW (*)    Casts GRANULAR CAST (*)    All other components within normal limits  URINE CULTURE  CULTURE, BLOOD (ROUTINE X 2)  CULTURE, BLOOD (ROUTINE X 2)  D-DIMER, QUANTITATIVE  RAPID HIV SCREEN ([facility])  URINE RAPID DRUG SCREEN (HOSP PERFORMED)  PROTIME-INR  I-STAT TROPOININ, ED  I-STAT CG4 LACTIC ACID, ED  I-STAT CG4 LACTIC ACID, ED  I-STAT TROPOININ, ED    Imaging Review Ct Chest W Contrast  06/14/2013   CLINICAL DATA:  Chest pain, lower abdominal pain,  EXAM: CT CHEST, ABDOMEN, AND PELVIS WITH CONTRAST  TECHNIQUE: Multidetector CT imaging of the chest, abdomen and pelvis was performed following the standard protocol during bolus administration of intravenous contrast.  CONTRAST:  100mL OMNIPAQUE IOHEXOL 300 MG/ML  SOLN  COMPARISON:  Chest x-ray earlier today. CT abdomen and pelvis 07/08/2010  FINDINGS: CT CHEST FINDINGS  Normal cardiac size. Permanent transvenous pacer good position. Good opacification of the arch and great vessels as well as central pulmonary  arteries reveals no abnormality.  No suspicious pulmonary nodules. No hilar or mediastinal adenopathy.  There is severe emphysematous change with large bilateral apical air cysts. On the left there is a superimposed acute pneumonia with many of the cysts filled with fluid. Consolidation is seen more inferiorly and posteriorly the left upper lobe. Some infiltrate extends into the lingula. There is no effusion. There is no pneumothorax. Osseous structures unremarkable.  CT ABDOMEN AND PELVIS FINDINGS  BODY WALL: Negative.  LOWER CHEST: Unremarkable.  ABDOMEN/PELVIS:  Liver: No focal abnormality.  Biliary: No evidence of biliary obstruction or [place]. Status post cholecystectomy.  Pancreas: Unremarkable.  Spleen: Unremarkable.  Adrenals: Unremarkable.  Kidneys and ureters: No hydronephrosis or Gotto.  Bladder: Unremarkable.  Reproductive: Unremarkable.  Bowel: No obstruction. Normal appendix.  Retroperitoneum: No mass or adenopathy.  Peritoneum: No free fluid or gas.  Vascular: No acute abnormality.  Moderate calcification.  OSSEOUS: No acute abnormalities.  IMPRESSION: Severe COPD with acute left upper lobe and lingular pneumonia.  No acute intra-abdominal or pelvic findings.   Electronically Signed   By: Davonna BellingJohn  Curnes M.D.   On: 06/14/2013 00:03   Ct Abdomen Pelvis W Contrast  06/14/2013   CLINICAL DATA:  Chest pain, lower abdominal pain,  EXAM: CT CHEST, ABDOMEN, AND PELVIS WITH CONTRAST  TECHNIQUE: Multidetector CT imaging of the chest, abdomen and pelvis was performed following the standard protocol during bolus administration of intravenous contrast.  CONTRAST:  100mL OMNIPAQUE IOHEXOL 300 MG/ML  SOLN  COMPARISON:  Chest x-ray earlier today. CT abdomen and pelvis 07/08/2010  FINDINGS: CT CHEST FINDINGS  Normal cardiac size. Permanent transvenous pacer good position. Good opacification of the arch and great vessels as well as central pulmonary arteries reveals no abnormality.  No suspicious pulmonary nodules. No  hilar or mediastinal adenopathy.  There is severe emphysematous change with large bilateral apical air cysts. On the left there is a superimposed acute pneumonia with many of the cysts filled with fluid. Consolidation is seen more inferiorly and posteriorly the left upper lobe. Some infiltrate extends into the lingula. There is no effusion. There is no pneumothorax. Osseous structures unremarkable.  CT ABDOMEN AND PELVIS FINDINGS  BODY WALL: Negative.  LOWER CHEST: Unremarkable.  ABDOMEN/PELVIS:  Liver: No focal abnormality.  Biliary: No evidence of biliary obstruction or [place]. Status post cholecystectomy.  Pancreas: Unremarkable.  Spleen: Unremarkable.  Adrenals: Unremarkable.  Kidneys and ureters: No hydronephrosis or [place].  Bladder: Unremarkable.  Reproductive: Unremarkable.  Bowel: No obstruction. Normal appendix.  Retroperitoneum: No mass or adenopathy.  Peritoneum: No free fluid or gas.  Vascular: No acute abnormality.  Moderate calcification.  OSSEOUS: No acute abnormalities.  IMPRESSION: Severe COPD with acute left upper lobe and lingular pneumonia.  No acute intra-abdominal or pelvic findings.   Electronically Signed   By: Davonna Belling M.D.   On: 06/14/2013 00:03   Dg Chest Port 1 View  06/13/2013   CLINICAL DATA:  Chest pain and fever  EXAM: PORTABLE CHEST - 1 VIEW  COMPARISON:  09/06/2012  FINDINGS: Cardiac shadow is stable. A dual lead pacing device is again seen and stable. The lungs remain hyperinflated. Infiltrative changes noted in the left upper lobe. There is also some suggestion of cavitary component. Further evaluation by means of CT of the chest is recommended. The underlying bony structures are within normal limits.  IMPRESSION: Left upper lobe infiltrate with some suggestion of a cavitary component. CT is recommended for further evaluation.   Electronically Signed   By: Alcide Clever M.D.   On: 06/13/2013 20:32     EKG Interpretation   Date/Time:  Thursday June 13 2013 18:23:43  EDT Ventricular Rate:  95 PR Interval:  118 QRS Duration: 84 QT Interval:  348 QTC Calculation: 437 R Axis:   90 Text Interpretation:  Sinus rhythm Borderline short PR interval Borderline  right axis deviation Hx of pacemaker in past Confirmed by [name]  MD,  Taytem Ghattas [phone]) on 06/13/2013 7:01:45 PM      CRITICAL CARE Performed by: Vanetta Mulders Total critical care time: 60 Critical care time was exclusive of separately billable procedures and treating other patients. Critical care was necessary to treat or prevent imminent or life-threatening deterioration. Critical care was time spent personally by me on the following activities: development of treatment plan with patient and/or surrogate as well as nursing, discussions with consultants, evaluation of patient's response to treatment, examination of patient, obtaining history from patient or surrogate, ordering and performing treatments and interventions, ordering and review of laboratory studies, ordering and review of radiographic studies, pulse oximetry and re-evaluation of patient's condition.     MDM   Final diagnoses:  CAP (community acquired pneumonia)  UTI (lower urinary tract infection)  Hypotension    Patient has been sick the for the past 2-3 weeks. Prior to that always had a cough heavy smoker. Patient had decreased appetite weight loss fevers significant cough left-sided chest pain. No real hypoxia problems. Patient has a history of pacemaker for slow heart rate. This had that for many years. That was interrogated here tonight does show some runs of some tachycardia but nothing else. Patient arrived here with borderline blood pressures. Initially it was 94 systolic occasionally get as low as 85 to. His highest was 104 systolic. Patient given a liter fluid blood pressure showed some improvement. Chest x-ray raises concerns for a cavitary lesion in the left long patient was moved to respiratory isolation. Concern there  was for TB. CT of chest was ordered along with his CT abdomen and pelvis this was complaining of pelvic pain.  Patient's HIV was also checked that was negative there was some concern that perhaps he had an immunocompromise-type illness. Patient clearly has a history of probably COPD although not formally diagnosed. Does use an albuterol inhaler.  Patient's blood pressure still marginal despite fluids. Will receive  another liter of fluid. Blood pressure recently 85 systolic patient still mentating fine. Early on urinalysis came back consistent with urinary tract infection culture sent blood culture sent patient started on Rocephin for that. When chest x-ray came back Zithromax now ordered.  CT of abdomen and pelvis without any significant findings despite the liver function test abnormalities. CT of chest more consistent with pneumonia and emphysema type blebs. Looks less likely to be TB. Patient will be kept on the respiratory precautions however though. Discussed with hospitalist patient was admitted to step down unit. Patient's had his blood cultures he said his antibiotics patient started on 2 L of oxygen to another liter fluid. Patient's lactic acid was less than 2 if blood pressure remains low that will increase.  Patient's pacemaker was interrogated it shows some runs of tachycardia over the last several days. It seems to be functioning fine otherwise.  Vanetta MuldersScott Nehemiah Montee, MD 06/14/13 [id number]  Vanetta MuldersScott Sophea Rackham, MD 06/14/13 [id number]  Vanetta MuldersScott Luciann Gossett, MD 06/14/13 [phone]

## 2013-06-14 NOTE — Progress Notes (Signed)
INITIAL NUTRITION ASSESSMENT  DOCUMENTATION CODES Per approved criteria  -Severe malnutrition in the context of chronic illness  Pt meets criteria for severe MALNUTRITION in the context of chronic illness as evidenced by severe fat and muscle wasting and reported intake <75% for > 1 month.  INTERVENTION: - Resource Breeze po TID, each supplement provides 250 kcal and 9 grams of protein - Continue to encourage PO intake. -RD will continue to follow for nutrition care plan  NUTRITION DIAGNOSIS: Inadequate oral intake related to chronic pain as evidenced by reported intake less than estimated needs and wt loss.   Goal: Pt to meet >/= 90% of their estimated nutrition needs   Monitor:  Wt, po intake, acceptance of supplements, labs, I/O's  Reason for Assessment: Consult for unintentional wt loss  53 y.o. male  Admitting Dx: <principal problem not specified>  ASSESSMENT: 53 y.o. male who presents to the ED for eval of chronic pelvic pain, chest pain, and fever. CP comes and goes, is associated with cough, has been going on for at least a couple of months. Patient also reports weight loss over the past few months and night sweats as well as fever.   - Pt reports that his appetite has been very poor for the past several months to a year. - Pt weighed 140 lbs ~ 1 year ago, but has been slowly losing weight. He attributes this weight loss to chronic pain. - Pt does not like "milky nutritional supplements."  Nutrition Focused Physical Exam:  Subcutaneous Fat:  Orbital Region: severe wasting Upper Arm Region: severe wasting Thoracic and Lumbar Region: severe wasting  Muscle:  Temple Region: severe wasting Clavicle Bone Region: severe wasting Clavicle and Acromion Bone Region: severe wasting Scapular Bone Region: n/a Dorsal Hand: severe wasting Patellar Region: severe wasting Anterior Thigh Region: n/a Posterior Calf Region: severe wasting  Edema: none  Height: Ht Readings  from Last 1 Encounters:  06/14/13 5\' 7"  (1.702 m)    Weight: Wt Readings from Last 1 Encounters:  06/14/13 126 lb 15.8 oz (57.6 kg)    Ideal Body Weight: 66.1 kg  % Ideal Body Weight: 87%  Wt Readings from Last 10 Encounters:  06/14/13 126 lb 15.8 oz (57.6 kg)  10/23/12 131 lb 6.4 oz (59.603 kg)  09/14/12 128 lb (58.06 kg)  09/06/12 130 lb (58.968 kg)  09/06/12 130 lb (58.968 kg)    Usual Body Weight: 140 lbs, ~1 yr ago  % Usual Body Weight: 90%  BMI:  Body mass index is 19.88 kg/(m^2).  Estimated Nutritional Needs: Kcal: 1600-1800 Protein: 85-95 g Fluid: 1.6-1.8 L/day  Skin: WNL  Diet Order: General  EDUCATION NEEDS: -Education needs addressed   Intake/Output Summary (Last 24 hours) at 06/14/13 1211 Last data filed at 06/14/13 0933  Gross per 24 hour  Intake   1200 ml  Output      0 ml  Net   1200 ml    Last BM: prior to admission   Labs:   Recent Labs Lab 06/13/13 1855  NA 128*  K 3.6*  CL 87*  CO2 24  BUN 19  CREATININE 0.95  CALCIUM 8.3*  GLUCOSE 118*    CBG (last 3)  No results found for this basename: GLUCAP,  in the last 72 hours  Scheduled Meds: . ampicillin-sulbactam (UNASYN) IV  3 g Intravenous 4 times per day  . carbamazepine  200 mg Oral BID  . feeding supplement (ENSURE COMPLETE)  237 mL Oral BID BM  .  FLUoxetine  20 mg Oral BID  . heparin  5,000 Units Subcutaneous 3 times per day  . ipratropium  0.5 mg Nebulization Q6H  . mometasone-formoterol  2 puff Inhalation BID  . pantoprazole  40 mg Oral Daily  . [START ON 06/15/2013] pneumococcal 23 valent vaccine  0.5 mL Intramuscular Tomorrow-1000  . vancomycin  750 mg Intravenous Q12H    Continuous Infusions: . sodium chloride 100 mL/hr at 06/14/13 0100    Past Medical History  Diagnosis Date  . 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 Bradycardia   . Tobacco abuse   . Chronic chest pain   . Seizure disorder   . History of nuclear  stress test 06/14/2011    lexiscan; mild perfusion defect in basal inferosetpal & apical inferior region; negative for ischemia   . COPD (chronic obstructive pulmonary disease)   . Shortness of breath   . Pneumonia   . Anginal pain   . Pacemaker   . Depression     Past Surgical History  Procedure Laterality Date  . Pacemaker insertion  04/23/2003    [employer] Kappa[facility]  . Cardiac catheterization  2002, 2003    normal coronaries  . Transthoracic echocardiogram  05/14/2012    EF 50-55%, normal systolic function; mild MR (ordered for mitral valve disease 424.0)  . Hand surgery  1998    x3  . Eye surgery  [age] y.o.  . Gallbladder surgery  2003    Ebbie LatusHaley Hawkins RD, LDN

## 2013-06-14 NOTE — Progress Notes (Signed)
ANTIBIOTIC CONSULT NOTE - INITIAL  Pharmacy Consult for Unasyn, Vancomycin Indication: pneumonia  Allergies  Allergen Reactions  . Shellfish Allergy Nausea And Vomiting    Patient Measurements: Height: 5\' 7"  (170.2 cm) Weight: 126 lb 15.8 oz (57.6 kg) IBW/kg (Calculated) : 66.1 Adjusted Body Weight: n/a  Vital Signs: Temp: 97.5 F (36.4 C) (06/12 0457) Temp src: Oral (06/12 0457) BP: 99/58 mmHg (06/12 1200) Pulse Rate: 109 (06/12 1000) Intake/Output from previous day: 06/11 0701 - 06/12 0700 In: 300 [I.V.:300] Out: -  Intake/Output from this shift: Total I/O In: 900 [I.V.:800; IV Piggyback:100] Out: -   Labs:  Recent Labs  06/13/13 1855  WBC 16.6*  HGB 11.4*  PLT 257  CREATININE 0.95   Estimated Creatinine Clearance: 74.1 ml/min (by C-G formula based on Cr of 0.95). No results found for this basename: VANCOTROUGH, Leodis BinetVANCOPEAK, VANCORANDOM, GENTTROUGH, GENTPEAK, GENTRANDOM, TOBRATROUGH, TOBRAPEAK, TOBRARND, AMIKACINPEAK, AMIKACINTROU, AMIKACIN,  in the last 72 hours   Microbiology: Recent Results (from the past 720 hour(s))  MRSA PCR SCREENING     Status: None   Collection Time    06/14/13  6:29 AM      Result Value Ref Range Status   MRSA by PCR NEGATIVE  NEGATIVE Final   Comment:            The GeneXpert MRSA Assay (FDA     approved for NASAL specimens     only), is one component of a     comprehensive MRSA colonization     surveillance program. It is not     intended to diagnose MRSA     infection nor to guide or     monitor treatment for     MRSA infections.    Medical History: Past Medical History  Diagnosis Date  . 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 Bradycardia   . Tobacco abuse   . Chronic chest pain   . Seizure disorder   . History of nuclear stress test 06/14/2011    lexiscan; mild perfusion defect in basal inferosetpal & apical inferior region; negative for ischemia   . COPD (chronic obstructive  pulmonary disease)   . Shortness of breath   . Pneumonia   . Anginal pain   . Pacemaker   . Depression     Medications:  Prescriptions prior to admission  Medication Sig Dispense Refill  . albuterol (PROVENTIL HFA;VENTOLIN HFA) 108 (90 BASE) MCG/ACT inhaler Inhale 2 puffs into the lungs every 6 (six) hours as needed for wheezing.      . carbamazepine (CARBATROL) 200 MG 12 hr capsule Take 200 mg by mouth 2 (two) times daily.      [name]. FLUoxetine (PROZAC) 20 MG capsule Take 20 mg by mouth 2 (two) times daily.      . Fluticasone-Salmeterol (ADVAIR) 100-50 MCG/DOSE AEPB Inhale 1 puff into the lungs every 12 (twelve) hours.      [name]. ipratropium (ATROVENT HFA) 17 MCG/ACT inhaler Inhale 2 puffs into the lungs every 6 (six) hours.      . naproxen sodium (ANAPROX) 220 MG tablet Take 440 mg by mouth 3 (three) times daily as needed (pain).      [name]. omeprazole (PRILOSEC) 20 MG capsule Take 20 mg by mouth 2 (two) times daily.        Assessment: [age] YOM who presented to the ED for eval of chronic pelvic pain, chest pain and fever. CXR showed left upper lobe infiltrate with some  suggestion of a cavitary component. UA is also suggestive of a UTI. Pharmacy consulted to dose empiric antibiotics for pneumonia. WBC 16.6, afebrile, LA wnl. CrCl ~ 74 mL/min.    Goal of Therapy:  Vancomycin trough level 15-20 mcg/ml Resolution of infection  Plan:  1) Vancomycin 750 mg IV Q 12 hours  2) Unasyn 3 gm IV Q 6 hours  3) Monitor CBC, renal fx, cultures and patient's clinical progress 4) Vanc trough at steady state     Vinnie LevelBenjamin Dynastie Knoop, PharmD.  Clinical Pharmacist Pager [phone]

## 2013-06-14 NOTE — Progress Notes (Addendum)
Chart and images reviewed. Patient admitted after midnight. Pt examined. Has lost about 30 lbs over the past 1-2 years. CT shows left sided pneumonia with fluid filled blebs. Pt currently in airborne isolation and quantiferon gold ordered by admitting MD, but no AFBs. Currently on coverage for CAP. Will discuss abx, need for AFBs with ID.  HIV negative.  BP stable, and noted to be SBP 90 in last office visit.  Also, noted UTI on UA. Explains "pelvic pain" noted in H&P. Will transfer to medsurg.  Crista Curborinna Jondavid Schreier, MD [facility] [phone]

## 2013-06-14 NOTE — Consult Note (Signed)
[facility]  Total days of antibiotics 2        Day 1 vanco        Day 1 unasyn               Reason for Consult: pneumonia vs. Cavitary lesions    Referring Physician: Conley Canal  Active Problems:   CAP (community acquired pneumonia)   Protein-calorie malnutrition, severe    HPI: Michael [name] is a 53 y.o. 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 On cxr, found to have lul infiltrate with possible cavitary lesions. 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 He has ongoing fevers throughout the day. Leukocytosis on admit of 16.6 with left shift. Started on vanco and unasyn. Given concern for cavitary lesions. He was started on isolation for mTB rule out. + risk factor for Tb includes being incarcerated in 2003, no hx of travel overseas or being homeless  Past Medical History  Diagnosis Date  . 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 Bradycardia   . Tobacco abuse   . Chronic chest pain   . Seizure disorder   . History of nuclear stress test 06/14/2011    lexiscan; mild perfusion defect in basal inferosetpal & apical inferior region; negative for ischemia   . COPD (chronic obstructive pulmonary disease)   . Shortness of breath   . Pneumonia   . Anginal pain   . Pacemaker   . Depression     Allergies:  Allergies  Allergen Reactions  . Shellfish Allergy Nausea And Vomiting    MEDICATIONS: . ampicillin-sulbactam (UNASYN) IV  3 g Intravenous 4 times per day  . carbamazepine  200 mg Oral BID  . feeding supplement (RESOURCE BREEZE)  1 Container Oral TID BM  . FLUoxetine  20 mg Oral BID  . heparin  5,000 Units Subcutaneous 3 times per day  . ipratropium  0.5 mg Nebulization Q6H  . mometasone-formoterol  2 puff Inhalation BID  . pantoprazole   40 mg Oral Daily  . [START ON 06/15/2013] pneumococcal 23 valent vaccine  0.5 mL Intramuscular Tomorrow-1000  . vancomycin  750 mg Intravenous Q12H    History  Substance Use Topics  . Smoking status: Current Every Day Smoker -- 1.00 packs/day for 40 years    Types: Cigarettes  . Smokeless tobacco: Not on file  . Alcohol Use: Not on file    Family History  Problem Relation Age of Onset  . Heart Problems Mother     MVP  . CAD Father   . Heart Problems Other     aunts x 2 died of heart problems   . Heart attack Maternal Grandmother   . Hypertension Maternal Grandfather   . Heart attack Paternal Grandmother   . Heart attack Paternal Grandfather     Review of Systems  Constitutional: positive for fever, chills, diaphoresis, activity change, appetite change, fatigue and unexpected weight change.  HENT: Negative for congestion, sore throat, rhinorrhea, sneezing, trouble swallowing and sinus pressure.  Eyes: Negative for photophobia and visual disturbance.  Respiratory: positive for productive cough, chest tightness, shortness of breath, wheezing and stridor.  Cardiovascular: Negative for chest pain, palpitations and leg swelling.  Gastrointestinal: Negative for nausea, vomiting, abdominal pain, diarrhea, constipation, blood in  stool, abdominal distention and anal bleeding.  Genitourinary: Negative for dysuria, hematuria, flank pain and difficulty urinating.  Musculoskeletal: Negative for myalgias, back pain, joint swelling, arthralgias and gait problem.  Skin: Negative for color change, pallor, rash and wound.  Neurological: Negative for dizziness, tremors, weakness and light-headedness.  Hematological: Negative for adenopathy. Does not bruise/bleed easily.  Psychiatric/Behavioral: Negative for behavioral problems, confusion, sleep disturbance, dysphoric mood, decreased concentration and agitation.     OBJECTIVE: Temp:  [97.5 F (36.4 C)-101.5 F (38.6 C)] 101.2 F (38.4 C)  (06/12 1510) Pulse Rate:  [47-109] 95 (06/12 1510) Resp:  [7-25] 16 (06/12 1510) BP: (86-119)/(46-72) 110/57 mmHg (06/12 1510) SpO2:  [90 %-100 %] 97 % (06/12 1510) Weight:  [126 lb 15.8 oz (57.6 kg)] 126 lb 15.8 oz (57.6 kg) (06/12 0700)  Constitutional: He is oriented to person, place, and time. He appears well-developed and well-nourished. No distress. diaphoretic  HENT: poor dentition Mouth/Throat: Oropharynx is clear and moist. No oropharyngeal exudate.  Cardiovascular: Normal rate, regular rhythm and normal heart sounds. Exam reveals no gallop and no friction rub.  No murmur heard.  Pulmonary/Chest: Effort normal and breath sounds normal. No respiratory distress. He has no wheezes.  Abdominal: Soft. Bowel sounds are normal. He exhibits no distension. There is no tenderness.  Lymphadenopathy:  He has no cervical adenopathy.  Neurological: He is alert and oriented to person, place, and time.  Skin: Skin is warm and dry. No rash noted. No erythema.  Psychiatric: He has a normal mood and affect. His behavior is normal.    LABS: Results for orders placed during the hospital encounter of 06/13/13 (from the past 48 hour(s))  CBC     Status: Abnormal   Collection Time    06/13/13  6:55 PM      Result Value Ref Range   WBC 16.6 (*) 4.0 - 10.5 K/uL   RBC 3.41 (*) 4.22 - 5.81 MIL/uL   Hemoglobin 11.4 (*) 13.0 - 17.0 g/dL   HCT 32.8 (*) 39.0 - 52.0 %   MCV 96.2  78.0 - 100.0 fL   MCH 33.4  26.0 - 34.0 pg   MCHC 34.8  30.0 - 36.0 g/dL   RDW 12.3  11.5 - 15.5 %   Platelets 257  150 - 400 K/uL  BASIC METABOLIC PANEL     Status: Abnormal   Collection Time    06/13/13  6:55 PM      Result Value Ref Range   Sodium 128 (*) 137 - 147 mEq/L   Potassium 3.6 (*) 3.7 - 5.3 mEq/L   Chloride 87 (*) 96 - 112 mEq/L   CO2 24  19 - 32 mEq/L   Glucose, Bld 118 (*) 70 - 99 mg/dL   BUN 19  6 - 23 mg/dL   Creatinine, Ser 0.95  0.50 - 1.35 mg/dL   Calcium 8.3 (*) 8.4 - 10.5 mg/dL   GFR calc non Af  Amer >90  >90 mL/min   GFR calc Af Amer >90  >90 mL/min   Comment: (NOTE)     The eGFR has been calculated using the CKD EPI equation.     This calculation has not been validated in all clinical situations.     eGFR's persistently <90 mL/min signify possible Chronic Kidney     Disease.  Randolm Idol, ED     Status: None   Collection Time    06/13/13  7:00 PM      Result Value Ref Range  Troponin i, poc 0.01  0.00 - 0.08 ng/mL   Comment 3            Comment: Due to the release kinetics of cTnI,     a negative result within the first hours     of the onset of symptoms does not rule out     myocardial infarction with certainty.     If myocardial infarction is still suspected,     repeat the test at appropriate intervals.  I-STAT CG4 LACTIC ACID, ED     Status: None   Collection Time    06/13/13  7:03 PM      Result Value Ref Range   Lactic Acid, Venous 0.90  0.5 - 2.2 mmol/L  PRO B NATRIURETIC PEPTIDE     Status: Abnormal   Collection Time    06/13/13  8:32 PM      Result Value Ref Range   Pro B Natriuretic peptide (BNP) 235.4 (*) 0 - 125 pg/mL  D-DIMER, QUANTITATIVE     Status: None   Collection Time    06/13/13  8:32 PM      Result Value Ref Range   D-Dimer, Quant <0.27  0.00 - 0.48 ug/mL-FEU   Comment:            AT THE INHOUSE ESTABLISHED CUTOFF     VALUE OF 0.48 ug/mL FEU,     THIS ASSAY HAS BEEN DOCUMENTED     IN THE LITERATURE TO HAVE     A SENSITIVITY AND NEGATIVE     PREDICTIVE VALUE OF AT LEAST     98 TO 99%.  THE TEST RESULT     SHOULD BE CORRELATED WITH     AN ASSESSMENT OF THE CLINICAL     PROBABILITY OF DVT / VTE.  HEPATIC FUNCTION PANEL     Status: Abnormal   Collection Time    06/13/13  8:32 PM      Result Value Ref Range   Total Protein 6.6  6.0 - 8.3 g/dL   Albumin 2.2 (*) 3.5 - 5.2 g/dL   AST 53 (*) 0 - 37 U/L   ALT 31  0 - 53 U/L   Alkaline Phosphatase 350 (*) 39 - 117 U/L   Total Bilirubin 1.7 (*) 0.3 - 1.2 mg/dL   Bilirubin, Direct 0.9  (*) 0.0 - 0.3 mg/dL   Indirect Bilirubin 0.8  0.3 - 0.9 mg/dL  RAPID HIV SCREEN [facility])     Status: None   Collection Time    06/13/13  8:32 PM      Result Value Ref Range   SUDS Rapid HIV Screen NON REACTIVE  NON REACTIVE   Comment: RESULT CALLED TO, READ BACK BY AND VERIFIED WITH:     NOTIFIED E OLSEN,RN 06/13/13 2138 BY RHOLMES  PROTIME-INR     Status: None   Collection Time    06/13/13  8:32 PM      Result Value Ref Range   Prothrombin Time 14.7  11.6 - 15.2 seconds   INR 1.17  0.00 - 1.49  I-STAT TROPOININ, ED     Status: None   Collection Time    06/13/13  8:53 PM      Result Value Ref Range   Troponin i, poc 0.01  0.00 - 0.08 ng/mL   Comment 3            Comment: Due to the release kinetics of cTnI,     a negative result  within the first hours     of the onset of symptoms does not rule out     myocardial infarction with certainty.     If myocardial infarction is still suspected,     repeat the test at appropriate intervals.  I-STAT CG4 LACTIC ACID, ED     Status: None   Collection Time    06/13/13  8:55 PM      Result Value Ref Range   Lactic Acid, Venous 1.08  0.5 - 2.2 mmol/L  URINALYSIS, ROUTINE W REFLEX MICROSCOPIC     Status: Abnormal   Collection Time    06/13/13  9:46 PM      Result Value Ref Range   Color, Urine ORANGE (*) YELLOW   Comment: BIOCHEMICALS MAY BE AFFECTED BY COLOR   APPearance CLOUDY (*) CLEAR   Specific Gravity, Urine 1.027  1.005 - 1.030   pH 5.5  5.0 - 8.0   Glucose, UA NEGATIVE  NEGATIVE mg/dL   Hgb urine dipstick NEGATIVE  NEGATIVE   Bilirubin Urine LARGE (*) NEGATIVE   Ketones, ur 15 (*) NEGATIVE mg/dL   Protein, ur 30 (*) NEGATIVE mg/dL   Urobilinogen, UA 4.0 (*) 0.0 - 1.0 mg/dL   Nitrite POSITIVE (*) NEGATIVE   Leukocytes, UA SMALL (*) NEGATIVE  URINE RAPID DRUG SCREEN (HOSP PERFORMED)     Status: None   Collection Time    06/13/13  9:46 PM      Result Value Ref Range   Opiates NONE DETECTED  NONE DETECTED   Cocaine NONE  DETECTED  NONE DETECTED   Benzodiazepines NONE DETECTED  NONE DETECTED   Amphetamines NONE DETECTED  NONE DETECTED   Tetrahydrocannabinol NONE DETECTED  NONE DETECTED   Barbiturates NONE DETECTED  NONE DETECTED   Comment:            DRUG SCREEN FOR MEDICAL PURPOSES     ONLY.  IF CONFIRMATION IS NEEDED     FOR ANY PURPOSE, NOTIFY LAB     WITHIN 5 DAYS.                LOWEST DETECTABLE LIMITS     FOR URINE DRUG SCREEN     Drug Class       Cutoff (ng/mL)     Amphetamine      1000     Barbiturate      200     Benzodiazepine   132     Tricyclics       440     Opiates          300     Cocaine          300     THC              50  URINE MICROSCOPIC-ADD ON     Status: Abnormal   Collection Time    06/13/13  9:46 PM      Result Value Ref Range   Squamous Epithelial / LPF RARE  RARE   WBC, UA 3-6  <3 WBC/hpf   Bacteria, UA FEW (*) RARE   Casts GRANULAR CAST (*) NEGATIVE   Comment: HYALINE CASTS  GLUCOSE, CAPILLARY     Status: Abnormal   Collection Time    06/14/13  6:23 AM      Result Value Ref Range   Glucose-Capillary 141 (*) 70 - 99 mg/dL   Comment 1 Documented in Chart     Comment 2 Notify RN  MRSA PCR SCREENING     Status: None   Collection Time    06/14/13  6:29 AM      Result Value Ref Range   MRSA by PCR NEGATIVE  NEGATIVE   Comment:            The GeneXpert MRSA Assay (FDA     approved for NASAL specimens     only), is one component of a     comprehensive MRSA colonization     surveillance program. It is not     intended to diagnose MRSA     infection nor to guide or     monitor treatment for     MRSA infections.    MICRO: 6/11 blood cx ngtd 6/12 blood cx ngtd 6/12 afb x 1 IMAGING: Ct Chest W Contrast  06/14/2013   CLINICAL DATA:  Chest pain, lower abdominal pain,  EXAM: CT CHEST, ABDOMEN, AND PELVIS WITH CONTRAST  TECHNIQUE: Multidetector CT imaging of the chest, abdomen and pelvis was performed following the standard protocol during bolus administration  of intravenous contrast.  CONTRAST:  133m OMNIPAQUE IOHEXOL 300 MG/ML  SOLN  COMPARISON:  Chest x-ray earlier today. CT abdomen and pelvis 07/08/2010  FINDINGS: CT CHEST FINDINGS  Normal cardiac size. Permanent transvenous pacer good position. Good opacification of the arch and great vessels as well as central pulmonary arteries reveals no abnormality.  No suspicious pulmonary nodules. No hilar or mediastinal adenopathy.  There is severe emphysematous change with large bilateral apical air cysts. On the left there is a superimposed acute pneumonia with many of the cysts filled with fluid. Consolidation is seen more inferiorly and posteriorly the left upper lobe. Some infiltrate extends into the lingula. There is no effusion. There is no pneumothorax. Osseous structures unremarkable.  CT ABDOMEN AND PELVIS FINDINGS  BODY WALL: Negative.  LOWER CHEST: Unremarkable.  ABDOMEN/PELVIS:  Liver: No focal abnormality.  Biliary: No evidence of biliary obstruction or [place]. Status post cholecystectomy.  Pancreas: Unremarkable.  Spleen: Unremarkable.  Adrenals: Unremarkable.  Kidneys and ureters: No hydronephrosis or [place].  Bladder: Unremarkable.  Reproductive: Unremarkable.  Bowel: No obstruction. Normal appendix.  Retroperitoneum: No mass or adenopathy.  Peritoneum: No free fluid or gas.  Vascular: No acute abnormality.  Moderate calcification.  OSSEOUS: No acute abnormalities.  IMPRESSION: Severe COPD with acute left upper lobe and lingular pneumonia.  No acute intra-abdominal or pelvic findings.   Electronically Signed   By: JRolla FlattenM.D.   On: 06/14/2013 00:03   Ct Abdomen Pelvis W Contrast  06/14/2013   CLINICAL DATA:  Chest pain, lower abdominal pain,  EXAM: CT CHEST, ABDOMEN, AND PELVIS WITH CONTRAST  TECHNIQUE: Multidetector CT imaging of the chest, abdomen and pelvis was performed following the standard protocol during bolus administration of intravenous contrast.  CONTRAST:  1058mOMNIPAQUE IOHEXOL 300 MG/ML   SOLN  COMPARISON:  Chest x-ray earlier today. CT abdomen and pelvis 07/08/2010  FINDINGS: CT CHEST FINDINGS  Normal cardiac size. Permanent transvenous pacer good position. Good opacification of the arch and great vessels as well as central pulmonary arteries reveals no abnormality.  No suspicious pulmonary nodules. No hilar or mediastinal adenopathy.  There is severe emphysematous change with large bilateral apical air cysts. On the left there is a superimposed acute pneumonia with many of the cysts filled with fluid. Consolidation is seen more inferiorly and posteriorly the left upper lobe. Some infiltrate extends into the lingula. There is no effusion. There is no pneumothorax. Osseous structures  unremarkable.  CT ABDOMEN AND PELVIS FINDINGS  BODY WALL: Negative.  LOWER CHEST: Unremarkable.  ABDOMEN/PELVIS:  Liver: No focal abnormality.  Biliary: No evidence of biliary obstruction or [place]. Status post cholecystectomy.  Pancreas: Unremarkable.  Spleen: Unremarkable.  Adrenals: Unremarkable.  Kidneys and ureters: No hydronephrosis or [place].  Bladder: Unremarkable.  Reproductive: Unremarkable.  Bowel: No obstruction. Normal appendix.  Retroperitoneum: No mass or adenopathy.  Peritoneum: No free fluid or gas.  Vascular: No acute abnormality.  Moderate calcification.  OSSEOUS: No acute abnormalities.  IMPRESSION: Severe COPD with acute left upper lobe and lingular pneumonia.  No acute intra-abdominal or pelvic findings.   Electronically Signed   By: Rolla Flatten M.D.   On: 06/14/2013 00:03   Dg Chest Port 1 View  06/13/2013   CLINICAL DATA:  Chest pain and fever  EXAM: PORTABLE CHEST - 1 VIEW  COMPARISON:  09/06/2012  FINDINGS: Cardiac shadow is stable. A dual lead pacing device is again seen and stable. The lungs remain hyperinflated. Infiltrative changes noted in the left upper lobe. There is also some suggestion of cavitary component. Further evaluation by means of CT of the chest is recommended. The  underlying bony structures are within normal limits.  IMPRESSION: Left upper lobe infiltrate with some suggestion of a cavitary component. CT is recommended for further evaluation.   Electronically Signed   By: Inez Catalina M.D.   On: 06/13/2013 20:32    Assessment/Plan:  53yo M with lul pneumonia, concern for abscess/cavitary lesions.  Mostly likely to be bacterial pneumonia but it would make sense to rule out mTB due to incarceration hx  - continue with vancomycin and unasyn for the time being, would try to get sputum culture - recommend get 3 sputum for AFB smear and cx - would check panorex to evaluate his poor dentition as another reason for his pneumonia - will check back on Monday to provide further recs  If questions over the weekend, jeff hatcher will be covering ID service  Kaithlyn Teagle B. [place] [facility] [phone]

## 2013-06-15 DIAGNOSIS — J984 Other disorders of lung: Secondary | ICD-10-CM

## 2013-06-15 DIAGNOSIS — A419 Sepsis, unspecified organism: Secondary | ICD-10-CM

## 2013-06-15 LAB — URINE CULTURE
COLONY COUNT: NO GROWTH
CULTURE: NO GROWTH

## 2013-06-15 LAB — BASIC METABOLIC PANEL
BUN: 14 mg/dL (ref 6–23)
CHLORIDE: 97 meq/L (ref 96–112)
CO2: 29 meq/L (ref 19–32)
Calcium: 8.4 mg/dL (ref 8.4–10.5)
Creatinine, Ser: 0.88 mg/dL (ref 0.50–1.35)
GFR calc Af Amer: >90 mL/min (ref >90)
GFR calc non Af Amer: >90 mL/min (ref >90)
Glucose, Bld: 132 mg/dL — ABNORMAL HIGH (ref 70–99)
Potassium: 3.7 mEq/L (ref 3.7–5.3)
SODIUM: 138 meq/L (ref 137–147)

## 2013-06-15 LAB — CBC WITH DIFFERENTIAL/PLATELET
BASOS PCT: 0 % (ref 0–1)
Basophils Absolute: 0 10*3/uL (ref 0.0–0.1)
EOS PCT: 0 % (ref 0–5)
Eosinophils Absolute: 0 10*3/uL (ref 0.0–0.7)
HEMATOCRIT: 30.7 % — AB (ref 39.0–52.0)
HEMOGLOBIN: 10.4 g/dL — AB (ref 13.0–17.0)
Lymphocytes Relative: 10 % — ABNORMAL LOW (ref 12–46)
Lymphs Abs: 1.3 10*3/uL (ref 0.7–4.0)
MCH: 32.9 pg (ref 26.0–34.0)
MCHC: 33.9 g/dL (ref 30.0–36.0)
MCV: 97.2 fL (ref 78.0–100.0)
MONOS PCT: 12 % (ref 3–12)
Monocytes Absolute: 1.6 10*3/uL — ABNORMAL HIGH (ref 0.1–1.0)
NEUTROS ABS: 10.3 10*3/uL — AB (ref 1.7–7.7)
Neutrophils Relative %: 78 % — ABNORMAL HIGH (ref 43–77)
Platelets: 278 10*3/uL (ref 150–400)
RBC: 3.16 MIL/uL — AB (ref 4.22–5.81)
RDW: 12.7 % (ref 11.5–15.5)
WBC: 13.2 10*3/uL — AB (ref 4.0–10.5)

## 2013-06-15 LAB — STREP PNEUMONIAE URINARY ANTIGEN: Strep Pneumo Urinary Antigen: NEGATIVE

## 2013-06-15 NOTE — Progress Notes (Signed)
[facility] PROGRESS NOTE  Michael PontoRoy S [name] RUE:[id number] DOB: 01/23/1960 DOA: 06/13/2013 PCP: PROVIDER NOT IN SYSTEM  Assessment/Plan: 53 y/o male with COPD/emphysema, seizures, tobacco use, chronic chest pain, s/p PPM due ot bradycardia presented with SOB, productive cough, fever, chills, weight loss found to pneumonia with cavitary lesion   1. Pneumonia with cavitary lung lesion, suspected abscess; r/o TB -cont IV atx, unasyn, vanc; f/u cultures; AFB; appreciate ID input   2. Sepsis/pneumonia, present on admission  -cont as above; IVF;  3. COPD/emphysema; tobacco use -cont bronchodilators prn; oxygen; stop smoking  4. ? h/o seizures; no  seizures for years per patient; cont home regimen  5. S/p PPM; stable; monitor    Code Status: full Family Communication:  D/w patient, his mother  (indicate person spoken with, relationship, and if by phone, the number) Disposition Plan: home pend clinical improvement    Consultants:  ID  Procedures:  none  Antibiotics:  unasyn 6/12<<<<<  vanc 6/12<<<<<   (indicate start date, and stop date if known)  HPI/Subjective: alert  Objective: Filed Vitals:   06/15/13 0533  BP: 93/58  Pulse: 63  Temp: 97.5 F (36.4 C)  Resp: 18    Intake/Output Summary (Last 24 hours) at 06/15/13 0843 Last data filed at 06/14/13 0933  Gross per 24 hour  Intake    200 ml  Output      0 ml  Net    200 ml   Filed Weights   06/14/13 0700  Weight: 57.6 kg (126 lb 15.8 oz)    Exam:   General:  alert  Cardiovascular: s1,s2 rrr  Respiratory: few rales   Abdomen: soft, nt,nd   Musculoskeletal: no LE edema   Data Reviewed: Basic Metabolic Panel:  Recent Labs Lab 06/13/13 1855 06/15/13 0447  NA 128* 138  K 3.6* 3.7  CL 87* 97  CO2 24 29  GLUCOSE 118* 132*  BUN 19 14  CREATININE 0.95 0.88  CALCIUM 8.3* 8.4   Liver Function Tests:  Recent Labs Lab 06/13/13 2032  AST 53*  ALT 31  ALKPHOS 350*  BILITOT 1.7*  PROT 6.6   ALBUMIN 2.2*   No results found for this basename: LIPASE, AMYLASE,  in the last 168 hours No results found for this basename: AMMONIA,  in the last 168 hours CBC:  Recent Labs Lab 06/13/13 1855 06/15/13 0447  WBC 16.6* 13.2*  NEUTROABS  --  10.3*  HGB 11.4* 10.4*  HCT 32.8* 30.7*  MCV 96.2 97.2  PLT 257 278   Cardiac Enzymes: No results found for this basename: CKTOTAL, CKMB, CKMBINDEX, TROPONINI,  in the last 168 hours BNP (last 3 results)  Recent Labs  06/13/13 2032  PROBNP 235.4*   CBG:  Recent Labs Lab 06/14/13 0623  GLUCAP 141*    Recent Results (from the past 240 hour(s))  URINE CULTURE     Status: None   Collection Time    06/13/13  9:46 PM      Result Value Ref Range Status   Specimen Description URINE, CATHETERIZED   Final   Special Requests NONE   Final   Culture  Setup Time     Final   Value: 06/14/2013 00:38     [facility]formed [facility]   Colony Count     Final   Value: NO GROWTH     [facility]   Culture     Final   Value: NO GROWTH  Performed at [facility]   Report Status 06/15/2013 FINAL   Final  MRSA PCR SCREENING     Status: None   Collection Time    06/14/13  6:29 AM      Result Value Ref Range Status   MRSA by PCR NEGATIVE  NEGATIVE Final   Comment:            The GeneXpert MRSA Assay (FDA     approved for NASAL specimens     only), is one component of a     comprehensive MRSA colonization     surveillance program. It is not     intended to diagnose MRSA     infection nor to guide or     monitor treatment for     MRSA infections.     Studies: Ct Chest W Contrast  06/14/2013   CLINICAL DATA:  Chest pain, lower abdominal pain,  EXAM: CT CHEST, ABDOMEN, AND PELVIS WITH CONTRAST  TECHNIQUE: Multidetector CT imaging of the chest, abdomen and pelvis was performed following the standard protocol during bolus administration of intravenous contrast.  CONTRAST:  100mL OMNIPAQUE IOHEXOL 300  MG/ML  SOLN  COMPARISON:  Chest x-ray earlier today. CT abdomen and pelvis 07/08/2010  FINDINGS: CT CHEST FINDINGS  Normal cardiac size. Permanent transvenous pacer good position. Good opacification of the arch and great vessels as well as central pulmonary arteries reveals no abnormality.  No suspicious pulmonary nodules. No hilar or mediastinal adenopathy.  There is severe emphysematous change with large bilateral apical air cysts. On the left there is a superimposed acute pneumonia with many of the cysts filled with fluid. Consolidation is seen more inferiorly and posteriorly the left upper lobe. Some infiltrate extends into the lingula. There is no effusion. There is no pneumothorax. Osseous structures unremarkable.  CT ABDOMEN AND PELVIS FINDINGS  BODY WALL: Negative.  LOWER CHEST: Unremarkable.  ABDOMEN/PELVIS:  Liver: No focal abnormality.  Biliary: No evidence of biliary obstruction or Casso. Status post cholecystectomy.  Pancreas: Unremarkable.  Spleen: Unremarkable.  Adrenals: Unremarkable.  Kidneys and ureters: No hydronephrosis or Vessels.  Bladder: Unremarkable.  Reproductive: Unremarkable.  Bowel: No obstruction. Normal appendix.  Retroperitoneum: No mass or adenopathy.  Peritoneum: No free fluid or gas.  Vascular: No acute abnormality.  Moderate calcification.  OSSEOUS: No acute abnormalities.  IMPRESSION: Severe COPD with acute left upper lobe and lingular pneumonia.  No acute intra-abdominal or pelvic findings.   Electronically Signed   By: Davonna BellingJohn  Curnes M.D.   On: 06/14/2013 00:03   Ct Abdomen Pelvis W Contrast  06/14/2013   CLINICAL DATA:  Chest pain, lower abdominal pain,  EXAM: CT CHEST, ABDOMEN, AND PELVIS WITH CONTRAST  TECHNIQUE: Multidetector CT imaging of the chest, abdomen and pelvis was performed following the standard protocol during bolus administration of intravenous contrast.  CONTRAST:  100mL OMNIPAQUE IOHEXOL 300 MG/ML  SOLN  COMPARISON:  Chest x-ray earlier today. CT abdomen and  pelvis 07/08/2010  FINDINGS: CT CHEST FINDINGS  Normal cardiac size. Permanent transvenous pacer good position. Good opacification of the arch and great vessels as well as central pulmonary arteries reveals no abnormality.  No suspicious pulmonary nodules. No hilar or mediastinal adenopathy.  There is severe emphysematous change with large bilateral apical air cysts. On the left there is a superimposed acute pneumonia with many of the cysts filled with fluid. Consolidation is seen more inferiorly and posteriorly the left upper lobe. Some infiltrate extends into the lingula. There is no effusion. There  is no pneumothorax. Osseous structures unremarkable.  CT ABDOMEN AND PELVIS FINDINGS  BODY WALL: Negative.  LOWER CHEST: Unremarkable.  ABDOMEN/PELVIS:  Liver: No focal abnormality.  Biliary: No evidence of biliary obstruction or Shutes. Status post cholecystectomy.  Pancreas: Unremarkable.  Spleen: Unremarkable.  Adrenals: Unremarkable.  Kidneys and ureters: No hydronephrosis or Dacquisto.  Bladder: Unremarkable.  Reproductive: Unremarkable.  Bowel: No obstruction. Normal appendix.  Retroperitoneum: No mass or adenopathy.  Peritoneum: No free fluid or gas.  Vascular: No acute abnormality.  Moderate calcification.  OSSEOUS: No acute abnormalities.  IMPRESSION: Severe COPD with acute left upper lobe and lingular pneumonia.  No acute intra-abdominal or pelvic findings.   Electronically Signed   By: Davonna Belling M.D.   On: 06/14/2013 00:03   Dg Chest Port 1 View  06/13/2013   CLINICAL DATA:  Chest pain and fever  EXAM: PORTABLE CHEST - 1 VIEW  COMPARISON:  09/06/2012  FINDINGS: Cardiac shadow is stable. A dual lead pacing device is again seen and stable. The lungs remain hyperinflated. Infiltrative changes noted in the left upper lobe. There is also some suggestion of cavitary component. Further evaluation by means of CT of the chest is recommended. The underlying bony structures are within normal limits.  IMPRESSION: Left  upper lobe infiltrate with some suggestion of a cavitary component. CT is recommended for further evaluation.   Electronically Signed   By: Alcide Clever M.D.   On: 06/13/2013 20:32    Scheduled Meds: . ampicillin-sulbactam (UNASYN) IV  3 g Intravenous 4 times per day  . carbamazepine  200 mg Oral BID  . feeding supplement (RESOURCE BREEZE)  1 Container Oral TID BM  . FLUoxetine  20 mg Oral BID  . heparin  5,000 Units Subcutaneous 3 times per day  . ipratropium  0.5 mg Nebulization BID  . mometasone-formoterol  2 puff Inhalation BID  . pantoprazole  40 mg Oral Daily  . pneumococcal 23 valent vaccine  0.5 mL Intramuscular Tomorrow-1000  . vancomycin  750 mg Intravenous Q12H   Continuous Infusions: . sodium chloride 100 mL/hr at 06/15/13 0454    Active Problems:   CAP (community acquired pneumonia)   Protein-calorie malnutrition, severe    Time spent: >35 minutes     Esperanza Sheets  [facility] Pager [phone]. If 7PM-7AM, please contact night-coverage at [email or web address], password [facility] 06/15/2013, 8:43 AM  LOS: 2 days

## 2013-06-16 DIAGNOSIS — R509 Fever, unspecified: Secondary | ICD-10-CM

## 2013-06-16 LAB — LEGIONELLA ANTIGEN, URINE: Legionella Antigen, Urine: NEGATIVE

## 2013-06-16 LAB — VANCOMYCIN, TROUGH: Vancomycin Tr: 6.6 ug/mL — ABNORMAL LOW (ref 10.0–20.0)

## 2013-06-16 MED ORDER — VANCOMYCIN HCL 10 G IV SOLR
1250.0000 mg | Freq: Two times a day (BID) | INTRAVENOUS | Status: DC
  Administered 2013-06-16 – 2013-06-24 (×16): 1250 mg via INTRAVENOUS
  Filled 2013-06-16 (×18): qty 1250

## 2013-06-16 MED ORDER — SODIUM CHLORIDE 0.9 % IV BOLUS (SEPSIS): 500.0000 mL | INTRAVENOUS | Status: DC | PRN

## 2013-06-16 MED ORDER — SODIUM CHLORIDE 0.9 % IV SOLN
3.0000 g | Freq: Four times a day (QID) | INTRAVENOUS | Status: DC
  Administered 2013-06-16 – 2013-06-17 (×3): 3 g via INTRAVENOUS
  Filled 2013-06-16 (×6): qty 3

## 2013-06-16 NOTE — Progress Notes (Signed)
Went to room to hang 1800 abx dose.  Patient's IV was out of the vein.  He requested to have the IV team replace access.  Lance BoschAnna Lofton Leon, RN

## 2013-06-16 NOTE — Progress Notes (Signed)
[facility] PROGRESS NOTE  Michael [name] JME:[id number] DOB: 1960-04-16 DOA: 06/13/2013 PCP: PROVIDER NOT IN SYSTEM  Assessment/Plan: 53 y/o male with COPD/emphysema, seizures, tobacco use, chronic chest pain, s/p PPM due ot bradycardia presented with SOB, productive cough, fever, chills, weight loss found to pneumonia with cavitary lesion   1. Pneumonia with cavitary lung lesion, suspected abscess; r/o TB -febrile; cont IV atx, unasyn, vanc; f/u cultures; AFB; appreciate ID input   2. Sepsis/pneumonia, present on admission  -cont as above; IVF;  3. COPD/emphysema; tobacco use -cont bronchodilators prn; oxygen; stop smoking  4. ? h/o seizures; no  seizures for years per patient; cont home regimen  5. S/p PPM; stable; monitor  6. Elevated alk phos, bili; check liver US; repeat CMP, lipase in AM   Code Status: full Family Communication:  D/w patient, his mother, father  (indicate person spoken with, relationship, and if by phone, the number) Disposition Plan: home pend clinical improvement    Consultants:  ID  Procedures:  none  Antibiotics:  unasyn 6/12<<<<<  vanc 6/12<<<<<   (indicate start date, and stop date if known)  HPI/Subjective: alert  Objective: Filed Vitals:   06/16/13 0654  BP: 122/69  Pulse: 78  Temp: 99.1 F (37.3 C)  Resp: 18    Intake/Output Summary (Last 24 hours) at 06/16/13 0944 Last data filed at 06/15/13 2000  Gross per 24 hour  Intake    896 ml  Output      0 ml  Net    896 ml   Filed Weights   06/14/13 0700  Weight: 57.6 kg (126 lb 15.8 oz)    Exam:   General:  alert  Cardiovascular: s1,s2 rrr  Respiratory: few rales   Abdomen: soft, nt,nd   Musculoskeletal: no LE edema   Data Reviewed: Basic Metabolic Panel:  Recent Labs Lab 06/13/13 1855 06/15/13 0447  NA 128* 138  K 3.6* 3.7  CL 87* 97  CO2 24 29  GLUCOSE 118* 132*  BUN 19 14  CREATININE 0.95 0.88  CALCIUM 8.3* 8.4   Liver Function  Tests:  Recent Labs Lab 06/13/13 2032  AST 53*  ALT 31  ALKPHOS 350*  BILITOT 1.7*  PROT 6.6  ALBUMIN 2.2*   No results found for this basename: LIPASE, AMYLASE,  in the last 168 hours No results found for this basename: AMMONIA,  in the last 168 hours CBC:  Recent Labs Lab 06/13/13 1855 06/15/13 0447  WBC 16.6* 13.2*  NEUTROABS  --  10.3*  HGB 11.4* 10.4*  HCT 32.8* 30.7*  MCV 96.2 97.2  PLT 257 278   Cardiac Enzymes: No results found for this basename: CKTOTAL, CKMB, CKMBINDEX, TROPONINI,  in the last 168 hours BNP (last 3 results)  Recent Labs  06/13/13 2032  PROBNP 235.4*   CBG:  Recent Labs Lab 06/14/13 0623  GLUCAP 141*    Recent Results (from the past 240 hour(s))  CULTURE, BLOOD (ROUTINE X 2)     Status: None   Collection Time    06/13/13  8:32 PM      Result Value Ref Range Status   Specimen Description BLOOD HAND RIGHT   Final   Special Requests BOTTLES DRAWN AEROBIC ONLY 3CC   Final   Culture  Setup Time     Final   Value: 06/14/2013 04:35     Performed at [facility]   Culture     Final   Value:  BLOOD CULTURE RECEIVED NO GROWTH TO DATE CULTURE WILL BE HELD FOR 5 DAYS BEFORE ISSUING A FINAL NEGATIVE REPORT     Performed at [facility]   Report Status PENDING   Incomplete  CULTURE, BLOOD (ROUTINE X 2)     Status: None   Collection Time    06/13/13  8:43 PM      Result Value Ref Range Status   Specimen Description BLOOD ARM LEFT   Final   Special Requests BOTTLES DRAWN AEROBIC AND ANAEROBIC 5CC   Final   Culture  Setup Time     Final   Value: 06/14/2013 04:36     Performed at [facility]   Culture     Final   Value:        BLOOD CULTURE RECEIVED NO GROWTH TO DATE CULTURE WILL BE HELD FOR 5 DAYS BEFORE ISSUING A FINAL NEGATIVE REPORT     Performed at [facility]   Report Status PENDING   Incomplete  URINE CULTURE     Status: None   Collection Time    06/13/13  9:46 PM      Result Value Ref  Range Status   Specimen Description URINE, CATHETERIZED   Final   Special Requests NONE   Final   Culture  Setup Time     Final   Value: 06/14/2013 00:38     Performed at [facility]     Final   Value: NO GROWTH     Performed at [facility]   Culture     Final   Value: NO GROWTH     Performed at [facility]   Report Status 06/15/2013 FINAL   Final  CULTURE, BLOOD (ROUTINE X 2)     Status: None   Collection Time    06/14/13  1:15 AM      Result Value Ref Range Status   Specimen Description BLOOD RIGHT ARM   Final   Special Requests BOTTLES DRAWN AEROBIC AND ANAEROBIC 10CC   Final   Culture  Setup Time     Final   Value: 06/14/2013 08:42     Performed at [facility]   Culture     Final   Value:        BLOOD CULTURE RECEIVED NO GROWTH TO DATE CULTURE WILL BE HELD FOR 5 DAYS BEFORE ISSUING A FINAL NEGATIVE REPORT     Performed at [facility]   Report Status PENDING   Incomplete  CULTURE, BLOOD (ROUTINE X 2)     Status: None   Collection Time    06/14/13  1:26 AM      Result Value Ref Range Status   Specimen Description BLOOD LEFT HAND   Final   Special Requests BOTTLES DRAWN AEROBIC AND ANAEROBIC 10CC   Final   Culture  Setup Time     Final   Value: 06/14/2013 08:42     Performed at [facility]   Culture     Final   Value:        BLOOD CULTURE RECEIVED NO GROWTH TO DATE CULTURE WILL BE HELD FOR 5 DAYS BEFORE ISSUING A FINAL NEGATIVE REPORT     Performed at [facility]   Report Status PENDING   Incomplete  MRSA PCR SCREENING     Status: None   Collection Time    06/14/13  6:29 AM      Result Value  Ref Range Status   MRSA by PCR NEGATIVE  NEGATIVE Final   Comment:            The GeneXpert MRSA Assay (FDA     approved for NASAL specimens     only), is one component of a     comprehensive MRSA colonization     surveillance program. It is not     intended to diagnose MRSA     infection nor to guide  or     monitor treatment for     MRSA infections.  AFB CULTURE WITH SMEAR     Status: None   Collection Time    06/14/13 11:50 AM      Result Value Ref Range Status   Specimen Description LUNG SPUTUM   Final   Special Requests NONE   Final   Acid Fast Smear     Final   Value: NO ACID FAST BACILLI SEEN     Performed at [facility]   Culture     Final   Value: CULTURE WILL BE EXAMINED FOR 6 WEEKS BEFORE ISSUING A FINAL REPORT     Performed at [facility]   Report Status PENDING   Incomplete     Studies: No results found.  Scheduled Meds: . ampicillin-sulbactam (UNASYN) IV  3 g Intravenous 4 times per day  . carbamazepine  200 mg Oral BID  . feeding supplement (RESOURCE BREEZE)  1 Container Oral TID BM  . FLUoxetine  20 mg Oral BID  . heparin  5,000 Units Subcutaneous 3 times per day  . ipratropium  0.5 mg Nebulization BID  . mometasone-formoterol  2 puff Inhalation BID  . pantoprazole  40 mg Oral Daily  . vancomycin  750 mg Intravenous Q12H   Continuous Infusions: . sodium chloride 100 mL/hr at 06/15/13 1957    Active Problems:   CAP (community acquired pneumonia)   Protein-calorie malnutrition, severe    Time spent: >35 minutes     Kinnie Feil  [facility] Pager [phone]. If 7PM-7AM, please contact night-coverage at [email or web address], password [facility] 06/16/2013, 9:44 AM  LOS: 3 days

## 2013-06-16 NOTE — Progress Notes (Signed)
ANTIBIOTIC CONSULT NOTE - FOLLOW UP  Pharmacy Consult for Vancomycin  Indication: pneumonia  Allergies  Allergen Reactions  . Shellfish Allergy Nausea And Vomiting   Vital Signs: Temp: 101.3 F (38.5 C) (06/14 2207) Temp src: Oral (06/14 2207) BP: 114/63 mmHg (06/14 2207) Pulse Rate: 88 (06/14 2207)  Labs:  Recent Labs  06/15/13 0447  WBC 13.2*  HGB 10.4*  PLT 278  CREATININE 0.88   Estimated Creatinine Clearance: 80 ml/min (by C-G formula based on Cr of 0.88).   Recent Labs  06/16/13 2132  VANCOTROUGH 6.6*    Assessment: 53 y/o M on vancomycin for PNA, possible cavitary lesions, VT is low at 6.6 (drawn correctly), WBC still elevated but trending down, renal function stable, still spiking temps.   Goal of Therapy:  Vancomycin trough level 15-20 mcg/ml  Plan:  -Increase vancomycin to 1250 mg IV q12h -Trend WBC, temp, renal function  -Repeat vancomycin trough at new steady state  Michael [name], Michael [name] 06/16/2013,10:40 PM

## 2013-06-17 ENCOUNTER — Inpatient Hospital Stay (HOSPITAL_COMMUNITY): Payer: Medicare HMO

## 2013-06-17 DIAGNOSIS — J852 Abscess of lung without pneumonia: Secondary | ICD-10-CM

## 2013-06-17 LAB — COMPREHENSIVE METABOLIC PANEL
ALT: 21 U/L (ref 0–53)
AST: 26 U/L (ref 0–37)
Albumin: 1.7 g/dL — ABNORMAL LOW (ref 3.5–5.2)
Alkaline Phosphatase: 318 U/L — ABNORMAL HIGH (ref 39–117)
BUN: 10 mg/dL (ref 6–23)
CO2: 27 mEq/L (ref 19–32)
CREATININE: 0.65 mg/dL (ref 0.50–1.35)
Calcium: 8.1 mg/dL — ABNORMAL LOW (ref 8.4–10.5)
Chloride: 99 mEq/L (ref 96–112)
GFR calc Af Amer: >90 mL/min (ref >90)
GFR calc non Af Amer: >90 mL/min (ref >90)
GLUCOSE: 90 mg/dL (ref 70–99)
Potassium: 3.5 mEq/L — ABNORMAL LOW (ref 3.7–5.3)
SODIUM: 138 meq/L (ref 137–147)
Total Bilirubin: 1.2 mg/dL (ref 0.3–1.2)
Total Protein: 6 g/dL (ref 6.0–8.3)

## 2013-06-17 LAB — CBC
HCT: 30.2 % — ABNORMAL LOW (ref 39.0–52.0)
HEMOGLOBIN: 10.3 g/dL — AB (ref 13.0–17.0)
MCH: 32.7 pg (ref 26.0–34.0)
MCHC: 34.1 g/dL (ref 30.0–36.0)
MCV: 95.9 fL (ref 78.0–100.0)
Platelets: 391 10*3/uL (ref 150–400)
RBC: 3.15 MIL/uL — ABNORMAL LOW (ref 4.22–5.81)
RDW: 13.2 % (ref 11.5–15.5)
WBC: 13.7 10*3/uL — ABNORMAL HIGH (ref 4.0–10.5)

## 2013-06-17 LAB — LIPASE, BLOOD: LIPASE: 18 U/L (ref 11–59)

## 2013-06-17 MED ORDER — PIPERACILLIN-TAZOBACTAM 3.375 G IVPB
3.3750 g | Freq: Three times a day (TID) | INTRAVENOUS | Status: DC
  Administered 2013-06-17 – 2013-06-24 (×21): 3.375 g via INTRAVENOUS
  Filled 2013-06-17 (×24): qty 50

## 2013-06-17 NOTE — Care Management Note (Signed)
    Page 1 of 2   06/23/2013     10:15:47 AM CARE MANAGEMENT NOTE 06/23/2013  Patient:  Michael Michael [name],Michael Michael [name]   Account Number:  [id number]  Date Initiated:  06/14/2013  Documentation initiated by:  [facility]  Subjective/Objective Assessment:   Admitted for CP, Sob and cough     Action/Plan:   Anticipated DC Date:  06/18/2013   Anticipated DC Plan:  HOME W HOME HEALTH SERVICES      DC Planning Services  CM consult      Choice offered to / List presented to:  C-1 Patient        HH arranged  HH-7 RESPIRATORY THERAPY  HH-1 RN      [facility]   Status of service:  Completed, signed off Medicare Important Message given?  YES (If response is "NO", the following Medicare IM given date fields will be blank) Date Medicare IM given:  06/17/2013 Date Additional Medicare IM given:  06/20/2013  Discharge Disposition:  HOME W HOME HEALTH SERVICES  Per UR Regulation:  Reviewed for med. necessity/level of care/duration of stay  If discussed at Long Length of Stay Meetings, dates discussed:    Comments:  06/23/13 10:00 CM received call from RN pt would be discharged today.  CM [name] Michael Michael [name] to confirm which IV ABX would be ordered.  Michael. Thedore Michael [name] printed new IV ABX orders. [facility].  [facility] rep, Michael Michael [name] [name] to inform prescriptions are on chart and last run dose of Vanc to be run at 13:00 and cefepime is new and on a q12hr schedule and Flagyl is new and on a q8hr schedule.  CM spoke with family and stated [facility] rep would be up to room to retrieve prescriptions and to set up schedule for IV ABX runs.  No other CM needs were communicated. Michael Michael [name], BSN, Michael Michael [name] [phone].  06/21/2013- Patient for possible discharge home the weekend; Michael Michael [name] choices offered, patient chose [facility] for HHRN/ RT; Michael Michael [name] with [facility] [name] for arrangements, also Michael ModenaPam Chandler RN with [name] [name] for home IV antibiotics; TCT Michael [facility] [name] ID, home IV antibiotics -  IV Zosyn 3.375mg  q8hrs and IV Vancomycin 1,250mg  q12hrs x 2 wks- [facility] protocol for labs draws and pt will follow up with her in the office after discharge; [facility] [facility] [place] and plans to follow up with him also; Noted possible home 02 - room air sats while ambulating is 93% and 98% sitting; patient does not qualify for home 02; Patient lives with his [name] Michael Michael [name]; Michael GoodellB Chandler RN,BSN,MHA [phone]  ContactDarra Lis:  Keener,Michael Michael [name] [phone]                 Delano MetzKeener,Michael Michael [name] [phone]    06/17/13 1600 Michael Balesourtney Robarge RN, MSN, CM- Patient provided Medicare IM letter.

## 2013-06-17 NOTE — Progress Notes (Signed)
Patient has had 3 sputum negative for AFB. Can be removed from airborne isolation  [place] Lake[place]ynthia B. Drue SecondSnider MD MPH [facility] [phone]

## 2013-06-17 NOTE — Progress Notes (Signed)
ANTIBIOTIC CONSULT NOTE - Follow up  Pharmacy Consult for Zosyn, Vancomycin Indication: Aspiration pneumonia   Allergies  Allergen Reactions  . Shellfish Allergy Nausea And Vomiting   Vital Signs: Temp: 99.7 F (37.6 C) (06/15 1427) Temp src: Oral (06/15 1427) BP: 115/63 mmHg (06/15 1427) Pulse Rate: 79 (06/15 1427)  Labs:  Recent Labs  06/15/13 0447 06/17/13 0637  WBC 13.2* 13.7*  HGB 10.4* 10.3*  PLT 278 391  CREATININE 0.88 0.65   Estimated Creatinine Clearance: 88 ml/min (by C-G formula based on Cr of 0.65).   Recent Labs  06/16/13 2132  VANCOTROUGH 6.6*    Assessment: 53 y/o M currently on Unasyn and vancomycin for pulmonary abscess which is slow to respond. ID changing Unasyn to Zosyn for pseudomonal coverage. WBC trended up slightly, renal function stable, still spiking temps.   Cultures:  6/15 Blood Cx x2>>  6/15 Sputum Cx >>  6/12 Blood Cx x2>> ngtd   Goal of Therapy:  Vancomycin trough level 15-20 mcg/ml  Plan:  -Continue Vancomycin at 1250 mg IV q12h -Start Zosyn 3.375 gm IV Q 8 hours  -Trend WBC, temp, renal function  -Repeat vancomycin trough at new steady state  Vinnie LevelBenjamin Zahara Rembert, PharmD.  Clinical Pharmacist Pager [phone]

## 2013-06-17 NOTE — Progress Notes (Signed)
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 Pneumonia with cavitary lung lesion, suspected abscess; r/o TB -intermittent febrile; cont IV atx, unasyn, vanc; blood cultures NGTD; prelim AFB neg; appreciate ID input   2. Sepsis/pneumonia, present on admission  -cont as above; IVF;  3. COPD/emphysema; tobacco use -cont bronchodilators prn; oxygen; stop smoking  4. ? h/o seizures; no  seizures for years per patient; cont home regimen  5. S/p PPM; stable; monitor  6. Elevated alk phos, bili; check liver US; repeat CMP improving; check PSA;   Code Status: full Family Communication:  D/w patient, his mother, father  (indicate person spoken with, relationship, and if by phone, the number) Disposition Plan: home pend clinical improvement    Consultants:  ID  Procedures:  none  Antibiotics:  unasyn 6/12<<<<<  vanc 6/12<<<<<   (indicate start date, and stop date if known)  HPI/Subjective: alert  Objective: Filed Vitals:   06/17/13 0622  BP: 122/70  Pulse: 78  Temp: 98.3 F (36.8 C)  Resp:     Intake/Output Summary (Last 24 hours) at 06/17/13 1023 Last data filed at 06/16/13 2200  Gross per 24 hour  Intake    240 ml  Output    200 ml  Net     40 ml   Filed Weights   06/14/13 0700  Weight: 57.6 kg (126 lb 15.8 oz)    Exam:   General:  alert  Cardiovascular: s1,s2 rrr  Respiratory: few rales   Abdomen: soft, nt,nd   Musculoskeletal: no LE edema   Data Reviewed: Basic Metabolic Panel:  Recent Labs Lab 06/13/13 1855 06/15/13 0447 06/17/13 0637  NA 128* 138 138  K 3.6* 3.7 3.5*  CL 87* 97 99  CO2 '24 29 27  ' GLUCOSE 118* 132* 90  BUN '19 14 10   ' CREATININE 0.95 0.88 0.65  CALCIUM 8.3* 8.4 8.1*   Liver Function Tests:  Recent Labs Lab 06/13/13 2032 06/17/13 0637  AST 53* 26  ALT 31 21  ALKPHOS 350* 318*  BILITOT 1.7* 1.2  PROT 6.6 6.0  ALBUMIN 2.2* 1.7*    Recent Labs Lab 06/17/13 0637  LIPASE 18   No results found for this basename: AMMONIA,  in the last 168 hours CBC:  Recent Labs Lab 06/13/13 1855 06/15/13 0447 06/17/13 0637  WBC 16.6* 13.2* 13.7*  NEUTROABS  --  10.3*  --   HGB 11.4* 10.4* 10.3*  HCT 32.8* 30.7* 30.2*  MCV 96.2 97.2 95.9  PLT 257 278 391   Cardiac Enzymes: No results found for this basename: CKTOTAL, CKMB, CKMBINDEX, TROPONINI,  in the last 168 hours BNP (last 3 results)  Recent Labs  06/13/13 2032  PROBNP 235.4*   CBG:  Recent Labs Lab 06/14/13 0623  GLUCAP 141*    Recent Results (from the past 240 hour(s))  CULTURE, BLOOD (ROUTINE X 2)     Status: None   Collection Time    06/13/13  8:32 PM      Result Value Ref Range Status   Specimen Description BLOOD HAND RIGHT   Final   Special Requests BOTTLES DRAWN AEROBIC ONLY 3CC   Final   Culture  Setup Time     Final   Value:  06/14/2013 04:35     Performed at [facility]   Culture     Final   Value:        BLOOD CULTURE RECEIVED NO GROWTH TO DATE CULTURE WILL BE HELD FOR 5 DAYS BEFORE ISSUING A FINAL NEGATIVE REPORT     Performed at [facility]   Report Status PENDING   Incomplete  CULTURE, BLOOD (ROUTINE X 2)     Status: None   Collection Time    06/13/13  8:43 PM      Result Value Ref Range Status   Specimen Description BLOOD ARM LEFT   Final   Special Requests BOTTLES DRAWN AEROBIC AND ANAEROBIC 5CC   Final   Culture  Setup Time     Final   Value: 06/14/2013 04:36     Performed at [facility]   Culture     Final   Value:        BLOOD CULTURE RECEIVED NO GROWTH TO DATE CULTURE WILL BE HELD FOR 5 DAYS BEFORE ISSUING A FINAL NEGATIVE REPORT     Performed at [facility]    Report Status PENDING   Incomplete  URINE CULTURE     Status: None   Collection Time    06/13/13  9:46 PM      Result Value Ref Range Status   Specimen Description URINE, CATHETERIZED   Final   Special Requests NONE   Final   Culture  Setup Time     Final   Value: 06/14/2013 00:38     Performed at [place]     Final   Value: NO GROWTH     Performed at [facility]   Culture     Final   Value: NO GROWTH     Performed at [facility]   Report Status 06/15/2013 FINAL   Final  CULTURE, BLOOD (ROUTINE X 2)     Status: None   Collection Time    06/14/13  1:15 AM      Result Value Ref Range Status   Specimen Description BLOOD RIGHT ARM   Final   Special Requests BOTTLES DRAWN AEROBIC AND ANAEROBIC 10CC   Final   Culture  Setup Time     Final   Value: 06/14/2013 08:42     Performed at [facility]   Culture     Final   Value:        BLOOD CULTURE RECEIVED NO GROWTH TO DATE CULTURE WILL BE HELD FOR 5 DAYS BEFORE ISSUING A FINAL NEGATIVE REPORT     Performed at [facility]   Report Status PENDING   Incomplete  CULTURE, BLOOD (ROUTINE X 2)     Status: None   Collection Time    06/14/13  1:26 AM      Result Value Ref Range Status   Specimen Description BLOOD LEFT HAND   Final   Special Requests BOTTLES DRAWN AEROBIC AND ANAEROBIC 10CC   Final   Culture  Setup Time     Final   Value: 06/14/2013 08:42     Performed at [facility]   Culture     Final   Value:        BLOOD CULTURE RECEIVED NO GROWTH TO DATE CULTURE WILL BE HELD FOR 5 DAYS BEFORE ISSUING A FINAL NEGATIVE REPORT     Performed at [facility]   Report Status PENDING  Incomplete  MRSA PCR SCREENING     Status: None   Collection Time    06/14/13  6:29 AM      Result Value Ref Range Status   MRSA by PCR NEGATIVE  NEGATIVE Final   Comment:            The GeneXpert MRSA Assay (FDA     approved for NASAL specimens     only), is one component of a      comprehensive MRSA colonization     surveillance program. It is not     intended to diagnose MRSA     infection nor to guide or     monitor treatment for     MRSA infections.  AFB CULTURE WITH SMEAR     Status: None   Collection Time    06/14/13 11:50 AM      Result Value Ref Range Status   Specimen Description LUNG SPUTUM   Final   Special Requests NONE   Final   Acid Fast Smear     Final   Value: NO ACID FAST BACILLI SEEN     Performed at [facility]   Culture     Final   Value: CULTURE WILL BE EXAMINED FOR 6 WEEKS BEFORE ISSUING A FINAL REPORT     Performed at [facility]   Report Status PENDING   Incomplete  AFB CULTURE WITH SMEAR     Status: None   Collection Time    06/15/13  4:24 PM      Result Value Ref Range Status   Specimen Description LUNG   Final   Special Requests Normal   Final   Acid Fast Smear     Final   Value: NO ACID FAST BACILLI SEEN     Performed at [facility]   Culture     Final   Value: CULTURE WILL BE EXAMINED FOR 6 WEEKS BEFORE ISSUING A FINAL REPORT     Performed at [facility]   Report Status PENDING   Incomplete     Studies: US Abdomen Complete  06/17/2013   CLINICAL DATA:  Elevated bilirubin.  EXAM: ULTRASOUND ABDOMEN COMPLETE  COMPARISON:  CT 07/08/2010.  FINDINGS: Gallbladder:  Cholecystectomy.  Common bile duct:  Diameter: 3.4 mm  Liver:  No focal lesion identified. Within normal limits in parenchymal echogenicity.  IVC:  No abnormality visualized.  Pancreas:  Visualized portion unremarkable.  Spleen:  Size and appearance within normal limits.  Right Kidney:  Length: Lab 0.3 cm. Echogenicity within normal limits. No mass or hydronephrosis visualized. Tiny amount of perinephric fluid noted.  Left Kidney:  Length: Lab 0.5 cm. Echogenicity within normal limits. No mass or hydronephrosis visualized.  Abdominal aorta:  No aneurysm visualized.  Other findings:  Bilateral pleural effusions  IMPRESSION: 1.  Cholecystectomy. No evidence of biliary distention or focal hepatic abnormality. 2. Bilateral pleural effusions. Small amount of right perinephric fluid also noted.   Electronically Signed   By: Marcello Moores  Register   On: 06/17/2013 09:23    Scheduled Meds: . ampicillin-sulbactam (UNASYN) IV  3 g Intravenous 4 times per day  . carbamazepine  200 mg Oral BID  . feeding supplement (RESOURCE BREEZE)  1 Container Oral TID BM  . FLUoxetine  20 mg Oral BID  . heparin  5,000 Units Subcutaneous 3 times per day  . ipratropium  0.5 mg Nebulization BID  . mometasone-formoterol  2 puff Inhalation BID  . pantoprazole  40 mg Oral Daily  . vancomycin  1,250 mg Intravenous Q12H   Continuous Infusions:    Active Problems:   CAP (community acquired pneumonia)   Protein-calorie malnutrition, severe    Time spent: >35 minutes     Kinnie Feil  [facility] Pager [phone]. If 7PM-7AM, please contact night-coverage at [email or web address], password [facility] 06/17/2013, 10:23 AM  LOS: 4 days

## 2013-06-17 NOTE — Progress Notes (Addendum)
[facility]    Date of Admission:  06/13/2013   Total days of antibiotics 5        Day 5 unasyn        Day 5 vanco           ID: Michael [name] is a 53 y.o. 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 Admitted for  have lul infiltrate with possible cavitary lesions. 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 On vanco and unasyn. mtb ruled out  Active Problems:   CAP (community acquired pneumonia)   Protein-calorie malnutrition, severe    Subjective: Still having fevers to 101F, feeling poorly, little po intake  Medications:  . ampicillin-sulbactam (UNASYN) IV  3 g Intravenous 4 times per day  . carbamazepine  200 mg Oral BID  . feeding supplement (RESOURCE BREEZE)  1 Container Oral TID BM  . FLUoxetine  20 mg Oral BID  . heparin  5,000 Units Subcutaneous 3 times per day  . ipratropium  0.5 mg Nebulization BID  . mometasone-formoterol  2 puff Inhalation BID  . pantoprazole  40 mg Oral Daily  . vancomycin  1,250 mg Intravenous Q12H    Objective: Vital signs in last 24 hours: Temp:  [98.3 F (36.8 C)-101.3 F (38.5 C)] 99.7 F (37.6 C) (06/15 1427) Pulse Rate:  [68-88] 79 (06/15 1427) Resp:  [18-20] 18 (06/15 1427) BP: (112-122)/(63-70) 115/63 mmHg (06/15 1427) SpO2:  [90 %-95 %] 95 % (06/15 1427) Constitutional: He is oriented to person, place, and time. Appears chronically ill. No distress. diaphoretic  HENT: poor dentition  Mouth/Throat: Oropharynx is clear and moist. No oropharyngeal exudate.  Cardiovascular: Normal rate, regular rhythm and normal heart sounds. Exam reveals no gallop and no friction rub.  No murmur heard.  Pulmonary/Chest: Effort normal and breath sounds normal. No respiratory distress. He has no wheezes.  Abdominal: Soft. Bowel sounds are normal.  He exhibits no distension. There is no tenderness.  Lymphadenopathy:  He has no cervical adenopathy.     Lab Results  Recent Labs  06/15/13 0447 06/17/13 0637  WBC 13.2* 13.7*  HGB 10.4* 10.3*  HCT 30.7* 30.2*  NA 138 138  K 3.7 3.5*  CL 97 99  CO2 29 27  BUN 14 10  CREATININE 0.88 0.65   Liver Panel  Recent Labs  06/17/13 0637  PROT 6.0  ALBUMIN 1.7*  AST 26  ALT 21  ALKPHOS 318*  BILITOT 1.2    Microbiology:  Studies/Results: [place] Abdomen Complete  06/17/2013   CLINICAL DATA:  Elevated bilirubin.  EXAM: ULTRASOUND ABDOMEN COMPLETE  COMPARISON:  CT 07/08/2010.  FINDINGS: Gallbladder:  Cholecystectomy.  Common bile duct:  Diameter: 3.4 mm  Liver:  No focal lesion identified. Within normal limits in parenchymal echogenicity.  IVC:  No abnormality visualized.  Pancreas:  Visualized portion unremarkable.  Spleen:  Size and appearance within normal limits.  Right Kidney:  Length: Lab 0.3 cm. Echogenicity within normal limits. No mass or hydronephrosis visualized. Tiny amount of perinephric fluid noted.  Left Kidney:  Length: Lab 0.5 cm. Echogenicity within normal limits. No mass or hydronephrosis visualized.  Abdominal aorta:  No aneurysm visualized.  Other findings:  Bilateral pleural effusions  IMPRESSION: 1. Cholecystectomy. No evidence of biliary distention or focal hepatic abnormality. 2.  Bilateral pleural effusions. Small amount of right perinephric fluid also noted.   Electronically Signed   By: Maisie Fushomas  Register   On: 06/17/2013 09:23     Assessment/Plan: Pulmonary abscess = slow to respond. Will change his antibiotics to piptazo for PsA coverage. Please check sputum cx. afb cx are the only sputum specimens sent thus far. Will repeat cxr to see if any changes. Will also repeat blood cx today. mtb ruled out for 3 specimens  Caleb Decock, [facility] Cell: [phone] Pager: [phone]  06/17/2013, 4:22 PM

## 2013-06-18 ENCOUNTER — Encounter (HOSPITAL_COMMUNITY): Payer: Self-pay | Admitting: Radiology

## 2013-06-18 ENCOUNTER — Inpatient Hospital Stay (HOSPITAL_COMMUNITY): Payer: Medicare HMO

## 2013-06-18 LAB — EXPECTORATED SPUTUM ASSESSMENT W GRAM STAIN, RFLX TO RESP C: Special Requests: NORMAL

## 2013-06-18 LAB — QUANTIFERON TB GOLD ASSAY (BLOOD)
Mitogen value: 0.25 IU/mL
QUANTIFERON NIL VALUE: 0.02 [IU]/mL
TB Ag value: 0.02 IU/mL
TB Antigen Minus Nil Value: 0 IU/mL

## 2013-06-18 LAB — PSA: PSA: 2.15 ng/mL (ref <=4.00)

## 2013-06-18 LAB — FREE PSA
PSA, Free Pct: 8 % — ABNORMAL LOW (ref >25)
PSA, Free: 0.17 ng/mL

## 2013-06-18 LAB — EXPECTORATED SPUTUM ASSESSMENT W REFEX TO RESP CULTURE

## 2013-06-18 MED ORDER — HYDROCODONE-HOMATROPINE 5-1.5 MG/5ML PO SYRP
5.0000 mL | ORAL_SOLUTION | Freq: Four times a day (QID) | ORAL | Status: DC | PRN
  Administered 2013-06-19 – 2013-06-23 (×3): 5 mL via ORAL
  Filled 2013-06-18 (×3): qty 5

## 2013-06-18 NOTE — Progress Notes (Signed)
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 Pneumonia with cavitary lung lesion, suspected abscess;  -still intermittent febrile; cont IV atx, vanc, Unasyn>zosyn on 6/15, vanc; blood cultures NGTD; prelim AFB neg; -plan to repeat CXR pend, repeat blood cultures, sputum pend; add hycodan for cough; appreciate ID input   2. Sepsis/pneumonia, present on admission  -cont as above; IVF as needed;  3. COPD/emphysema; tobacco use -cont bronchodilators prn; oxygen; stop smoking  4. ? h/o seizures; no  seizures for years per patient; cont home regimen  5. S/p PPM; stable; monitor  6. Elevated alk phos, bili;  -liver US: unremarkable, s/op cholecystectomy;  repeat CMP improving;    Code Status: full Family Communication:  D/w patient, his mother, father  (indicate person spoken with, relationship, and if by phone, the number) Disposition Plan: home pend clinical improvement    Consultants:  ID  Procedures:  none  Antibiotics:  unasyn 6/12<<<<<6/15  vanc 6/12<<<<<  Zosyn 6/15<<<<<<   (indicate start date, and stop date if known)  HPI/Subjective: alert  Objective: Filed Vitals:   06/18/13 0459  BP: 117/61  Pulse: 83  Temp: 100.2 F (37.9 C)  Resp: 18    Intake/Output Summary (Last 24 hours) at 06/18/13 0903 Last data filed at 06/17/13 1700  Gross per 24 hour  Intake    120 ml  Output      0 ml  Net    120 ml   Filed Weights   06/14/13 0700  Weight: 57.6 kg (126 lb 15.8 oz)    Exam:   General:  alert  Cardiovascular: s1,s2 rrr  Respiratory: few rales LL  Abdomen: soft, nt,nd   Musculoskeletal: no LE edema   Data Reviewed: Basic Metabolic  Panel:  Recent Labs Lab 06/13/13 1855 06/15/13 0447 06/17/13 0637  NA 128* 138 138  K 3.6* 3.7 3.5*  CL 87* 97 99  CO2 _0 GLUCOSE 118* 132* 90  BUN _1 CREATININE 0.95 0.88 0.65  CALCIUM 8.3* 8.4 8.1*   Liver Function Tests:  Recent Labs Lab 06/13/13 2032 06/17/13 0637  AST 53* 26  ALT 31 21  ALKPHOS 350* 318*  BILITOT 1.7* 1.2  PROT 6.6 6.0  ALBUMIN 2.2* 1.7*    Recent Labs Lab 06/17/13 0637  LIPASE 18   No results found for this basename: AMMONIA,  in the last 168 hours CBC:  Recent Labs Lab 06/13/13 1855 06/15/13 0447 06/17/13 0637  WBC 16.6* 13.2* 13.7*  NEUTROABS  --  10.3*  --   HGB 11.4* 10.4* 10.3*  HCT 32.8* 30.7* 30.2*  MCV 96.2 97.2 95.9  PLT 257 278 391   Cardiac Enzymes: No results found for this basename: CKTOTAL, CKMB, CKMBINDEX, TROPONINI,  in the last 168 hours BNP (last 3 results)  Recent Labs  06/13/13 2032  PROBNP 235.4*   CBG:  Recent Labs Lab 06/14/13 0623  GLUCAP 141*    Recent Results (from the past 240 hour(s))  CULTURE, BLOOD (ROUTINE X 2)     Status: None   Collection Time    06/13/13  8:32 PM      Result Value Ref Range Status   Specimen Description BLOOD HAND RIGHT   Final  Special Requests BOTTLES DRAWN AEROBIC ONLY 3CC   Final   Culture  Setup Time     Final   Value: 06/14/2013 04:35     Performed at [facility]   Culture     Final   Value:        BLOOD CULTURE RECEIVED NO GROWTH TO DATE CULTURE WILL BE HELD FOR 5 DAYS BEFORE ISSUING A FINAL NEGATIVE REPORT     Performed at [facility]   Report Status PENDING   Incomplete  CULTURE, BLOOD (ROUTINE X 2)     Status: None   Collection Time    06/13/13  8:43 PM      Result Value Ref Range Status   Specimen Description BLOOD ARM LEFT   Final   Special Requests BOTTLES DRAWN AEROBIC AND ANAEROBIC 5CC   Final   Culture  Setup Time     Final   Value: 06/14/2013 04:36     Performed at [facility]   Culture     Final    Value:        BLOOD CULTURE RECEIVED NO GROWTH TO DATE CULTURE WILL BE HELD FOR 5 DAYS BEFORE ISSUING A FINAL NEGATIVE REPORT     Performed at [facility]   Report Status PENDING   Incomplete  URINE CULTURE     Status: None   Collection Time    06/13/13  9:46 PM      Result Value Ref Range Status   Specimen Description URINE, CATHETERIZED   Final   Special Requests NONE   Final   Culture  Setup Time     Final   Value: 06/14/2013 00:38     Performed at [place]     Final   Value: NO GROWTH     Performed at [facility]   Culture     Final   Value: NO GROWTH     Performed at [facility]   Report Status 06/15/2013 FINAL   Final  CULTURE, BLOOD (ROUTINE X 2)     Status: None   Collection Time    06/14/13  1:15 AM      Result Value Ref Range Status   Specimen Description BLOOD RIGHT ARM   Final   Special Requests BOTTLES DRAWN AEROBIC AND ANAEROBIC 10CC   Final   Culture  Setup Time     Final   Value: 06/14/2013 08:42     Performed at [facility]   Culture     Final   Value:        BLOOD CULTURE RECEIVED NO GROWTH TO DATE CULTURE WILL BE HELD FOR 5 DAYS BEFORE ISSUING A FINAL NEGATIVE REPORT     Performed at [facility]   Report Status PENDING   Incomplete  CULTURE, BLOOD (ROUTINE X 2)     Status: None   Collection Time    06/14/13  1:26 AM      Result Value Ref Range Status   Specimen Description BLOOD LEFT HAND   Final   Special Requests BOTTLES DRAWN AEROBIC AND ANAEROBIC 10CC   Final   Culture  Setup Time     Final   Value: 06/14/2013 08:42     Performed at [facility]   Culture     Final   Value:        BLOOD CULTURE RECEIVED NO GROWTH TO DATE CULTURE WILL BE HELD FOR  5 DAYS BEFORE ISSUING A FINAL NEGATIVE REPORT     Performed at [facility]   Report Status PENDING   Incomplete  MRSA PCR SCREENING     Status: None   Collection Time    06/14/13  6:29 AM      Result Value Ref  Range Status   MRSA by PCR NEGATIVE  NEGATIVE Final   Comment:            The GeneXpert MRSA Assay (FDA     approved for NASAL specimens     only), is one component of a     comprehensive MRSA colonization     surveillance program. It is not     intended to diagnose MRSA     infection nor to guide or     monitor treatment for     MRSA infections.  AFB CULTURE WITH SMEAR     Status: None   Collection Time    06/14/13 11:50 AM      Result Value Ref Range Status   Specimen Description LUNG SPUTUM   Final   Special Requests NONE   Final   Acid Fast Smear     Final   Value: NO ACID FAST BACILLI SEEN     Performed at [facility]   Culture     Final   Value: CULTURE WILL BE EXAMINED FOR 6 WEEKS BEFORE ISSUING A FINAL REPORT     Performed at [facility]   Report Status PENDING   Incomplete  AFB CULTURE WITH SMEAR     Status: None   Collection Time    06/15/13  4:24 PM      Result Value Ref Range Status   Specimen Description LUNG   Final   Special Requests Normal   Final   Acid Fast Smear     Final   Value: NO ACID FAST BACILLI SEEN     Performed at [facility]   Culture     Final   Value: CULTURE WILL BE EXAMINED FOR 6 WEEKS BEFORE ISSUING A FINAL REPORT     Performed at [facility]   Report Status PENDING   Incomplete  AFB CULTURE WITH SMEAR     Status: None   Collection Time    06/16/13 10:08 AM      Result Value Ref Range Status   Specimen Description SPUTUM   Final   Special Requests Normal   Final   Acid Fast Smear     Final   Value: NO ACID FAST BACILLI SEEN     Performed at [facility]   Culture     Final   Value: CULTURE WILL BE EXAMINED FOR 6 WEEKS BEFORE ISSUING A FINAL REPORT     Performed at [facility]   Report Status PENDING   Incomplete  CULTURE, BLOOD (ROUTINE X 2)     Status: None   Collection Time    06/17/13  5:25 PM      Result Value Ref Range Status   Specimen Description BLOOD RIGHT HAND    Final   Special Requests BOTTLES DRAWN AEROBIC ONLY 10CC   Final   Culture  Setup Time     Final   Value: 06/17/2013 22:50     Performed at [facility]   Culture     Final   Value:        BLOOD CULTURE RECEIVED NO GROWTH TO DATE CULTURE  WILL BE HELD FOR 5 DAYS BEFORE ISSUING A FINAL NEGATIVE REPORT     Performed at [facility]   Report Status PENDING   Incomplete  CULTURE, BLOOD (ROUTINE X 2)     Status: None   Collection Time    06/17/13  5:30 PM      Result Value Ref Range Status   Specimen Description BLOOD LEFT HAND   Final   Special Requests BOTTLES DRAWN AEROBIC ONLY 3CC   Final   Culture  Setup Time     Final   Value: 06/17/2013 22:50     Performed at [facility]   Culture     Final   Value:        BLOOD CULTURE RECEIVED NO GROWTH TO DATE CULTURE WILL BE HELD FOR 5 DAYS BEFORE ISSUING A FINAL NEGATIVE REPORT     Performed at [facility]   Report Status PENDING   Incomplete     Studies: Dg Chest 2 View  06/18/2013   CLINICAL DATA:  Short of breath.  Cough.  Re-evaluate pneumonia.  EXAM: CHEST  2 VIEW  COMPARISON:  Chest CT and chest radiograph, 06/13/2013  FINDINGS: Consolidation in the left upper lobe has mildly increased, extending more inferiorly. Lateral view demonstrates mild consolidation in the anterior left lower lobe.  More dense consolidation with associated bullae in the left upper lobe at the apex is similar.  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 No right lung consolidation.  Minimal left pleural effusion.  No pneumothorax.  Cardiac silhouette is normal in size. Left upper lobe consolidation silhouettes the aortic arch and superior left hilum. No convincing mediastinal or hilar mass.  Stable left anterior chest wall sequential pacemaker, well positioned.  IMPRESSION: 1. Left upper lobe pneumonia has increased with consolidation extending more inferiorly in the left upper lobe and a small amount of  consolidation are noted in the left anterior lower lobe. 2. No other change.   Electronically Signed   By: Lajean Manes M.D.   On: 06/18/2013 08:48   US Abdomen Complete  06/17/2013   CLINICAL DATA:  Elevated bilirubin.  EXAM: ULTRASOUND ABDOMEN COMPLETE  COMPARISON:  CT 07/08/2010.  FINDINGS: Gallbladder:  Cholecystectomy.  Common bile duct:  Diameter: 3.4 mm  Liver:  No focal lesion identified. Within normal limits in parenchymal echogenicity.  IVC:  No abnormality visualized.  Pancreas:  Visualized portion unremarkable.  Spleen:  Size and appearance within normal limits.  Right Kidney:  Length: Lab 0.3 cm. Echogenicity within normal limits. No mass or hydronephrosis visualized. Tiny amount of perinephric fluid noted.  Left Kidney:  Length: Lab 0.5 cm. Echogenicity within normal limits. No mass or hydronephrosis visualized.  Abdominal aorta:  No aneurysm visualized.  Other findings:  Bilateral pleural effusions  IMPRESSION: 1. Cholecystectomy. No evidence of biliary distention or focal hepatic abnormality. 2. Bilateral pleural effusions. Small amount of right perinephric fluid also noted.   Electronically Signed   By: Marcello Moores  Register   On: 06/17/2013 09:23    Scheduled Meds: . carbamazepine  200 mg Oral BID  . feeding supplement (RESOURCE BREEZE)  1 Container Oral TID BM  . FLUoxetine  20 mg Oral BID  . heparin  5,000 Units Subcutaneous 3 times per day  . ipratropium  0.5 mg Nebulization BID  . mometasone-formoterol  2 puff Inhalation BID  . pantoprazole  40 mg Oral Daily  . piperacillin-tazobactam (ZOSYN)  IV  3.375 g  Intravenous Q8H  . vancomycin  1,250 mg Intravenous Q12H   Continuous Infusions:    Active Problems:   CAP (community acquired pneumonia)   Protein-calorie malnutrition, severe    Time spent: >35 minutes     Kinnie Feil  [facility] Pager [phone]. If 7PM-7AM, please contact night-coverage at [email or web address], password [facility] 06/18/2013, 9:03 AM  LOS: 5 days

## 2013-06-18 NOTE — Progress Notes (Signed)
[facility] PROGRESS NOTE  Michael [name] ZOX:[id number] DOB: 01/07/1960 DOA: 06/13/2013 PCP: PROVIDER NOT IN SYSTEM  CXR showed worsening of pneumonia -due to persistent fever, worsening of CXR will obtain CT chest for better evaluation; cont atx   [name], [name]  [facility] Pager [phone]. If 7PM-7AM, please contact night-coverage at [email or web address], password [email or web address] 06/18/2013, 1:41 PM  LOS: 5 days

## 2013-06-18 NOTE — Progress Notes (Signed)
UR COMPLETED  

## 2013-06-19 DIAGNOSIS — J441 Chronic obstructive pulmonary disease with (acute) exacerbation: Secondary | ICD-10-CM | POA: Diagnosis present

## 2013-06-19 LAB — CBC
HCT: 25.1 % — ABNORMAL LOW (ref 39.0–52.0)
Hemoglobin: 8.6 g/dL — ABNORMAL LOW (ref 13.0–17.0)
MCH: 32.7 pg (ref 26.0–34.0)
MCHC: 34.3 g/dL (ref 30.0–36.0)
MCV: 95.4 fL (ref 78.0–100.0)
PLATELETS: 421 10*3/uL — AB (ref 150–400)
RBC: 2.63 MIL/uL — ABNORMAL LOW (ref 4.22–5.81)
RDW: 13.4 % (ref 11.5–15.5)
WBC: 12.4 10*3/uL — AB (ref 4.0–10.5)

## 2013-06-19 LAB — BASIC METABOLIC PANEL
BUN: 6 mg/dL (ref 6–23)
CHLORIDE: 98 meq/L (ref 96–112)
CO2: 26 mEq/L (ref 19–32)
Calcium: 7.8 mg/dL — ABNORMAL LOW (ref 8.4–10.5)
Creatinine, Ser: 0.75 mg/dL (ref 0.50–1.35)
GFR calc non Af Amer: >90 mL/min (ref >90)
Glucose, Bld: 102 mg/dL — ABNORMAL HIGH (ref 70–99)
Potassium: 3.1 mEq/L — ABNORMAL LOW (ref 3.7–5.3)
Sodium: 136 mEq/L — ABNORMAL LOW (ref 137–147)

## 2013-06-19 LAB — VANCOMYCIN, TROUGH: VANCOMYCIN TR: 15.2 ug/mL (ref 10.0–20.0)

## 2013-06-19 MED ORDER — POTASSIUM CHLORIDE CRYS ER 20 MEQ PO TBCR
40.0000 meq | EXTENDED_RELEASE_TABLET | ORAL | Status: AC
  Administered 2013-06-19 (×2): 40 meq via ORAL
  Filled 2013-06-19 (×2): qty 2

## 2013-06-19 NOTE — Progress Notes (Signed)
ANTIBIOTIC CONSULT NOTE - FOLLOW UP  Pharmacy Consult for vancomycin and Zosyn Indication: pneumonia and pulmonary abscess  Allergies  Allergen Reactions  . Shellfish Allergy Nausea And Vomiting    Patient Measurements: Height: 5\' 7"  (170.2 cm) Weight: 126 lb 15.8 oz (57.6 kg) IBW/kg (Calculated) : 66.1  Vital Signs: Temp: 98 F (36.7 C) (06/17 1257) Temp src: Oral (06/17 1257) BP: 102/58 mmHg (06/17 1257) Pulse Rate: 60 (06/17 1257) Intake/Output from previous day: 06/16 0701 - 06/17 0700 In: -  Out: 400 [Urine:400] Intake/Output from this shift: Total I/O In: 240 [P.O.:240] Out: 275 [Urine:275]  Labs:  Recent Labs  06/17/13 0637 06/19/13 0416  WBC 13.7* 12.4*  HGB 10.3* 8.6*  PLT 391 421*  CREATININE 0.65 0.75   Estimated Creatinine Clearance: 88 ml/min (by C-G formula based on Cr of 0.75).  Recent Labs  06/16/13 2132 06/19/13 1056  VANCOTROUGH 6.6* 15.2     Microbiology: Recent Results (from the past 720 hour(s))  CULTURE, BLOOD (ROUTINE X 2)     Status: None   Collection Time    06/13/13  8:32 PM      Result Value Ref Range Status   Specimen Description BLOOD HAND RIGHT   Final   Special Requests BOTTLES DRAWN AEROBIC ONLY 3CC   Final   Culture  Setup Time     Final   Value: 06/14/2013 04:35     [facility]   Culture     Final   Value:        BLOOD CULTURE RECEIVED NO GROWTH TO DATE CULTURE WILL BE HELD FOR 5 DAYS BEFORE ISSUING A FINAL NEGATIVE REPORT     Performed at [facility]   Report Status PENDING   Incomplete  CULTURE, BLOOD (ROUTINE X 2)     Status: None   Collection Time    06/13/13  8:43 PM      Result Value Ref Range Status   Specimen Description BLOOD ARM LEFT   Final   Special Requests BOTTLES DRAWN AEROBIC AND ANAEROBIC 5CC   Final   Culture  Setup Time     Final   Value: 06/14/2013 04:36     [facility]formed [facility]   Culture     Final   Value:        BLOOD CULTURE RECEIVED NO GROWTH  TO DATE CULTURE WILL BE HELD FOR 5 DAYS BEFORE ISSUING A FINAL NEGATIVE REPORT     [facility]formed at [facility]   Report Status PENDING   Incomplete  URINE CULTURE     Status: None   Collection Time    06/13/13  9:46 PM      Result Value Ref Range Status   Specimen Description URINE, CATHETERIZED   Final   Special Requests NONE   Final   Culture  Setup Time     Final   Value: 06/14/2013 00:38     [facility]formed [facility]   Colony Count     Final   Value: NO GROWTH     [facility]formed at [facility]   Culture     Final   Value: NO GROWTH     Performed at [facility]   Report Status 06/15/2013 FINAL   Final  CULTURE, BLOOD (ROUTINE X 2)     Status: None   Collection Time    06/14/13  1:15 AM      Result Value Ref Range Status  Specimen Description BLOOD RIGHT ARM   Final   Special Requests BOTTLES DRAWN AEROBIC AND ANAEROBIC 10CC   Final   Culture  Setup Time     Final   Value: 06/14/2013 08:42     Performed at [facility]   Culture     Final   Value:        BLOOD CULTURE RECEIVED NO GROWTH TO DATE CULTURE WILL BE HELD FOR 5 DAYS BEFORE ISSUING A FINAL NEGATIVE REPORT     Performed at [facility]   Report Status PENDING   Incomplete  CULTURE, BLOOD (ROUTINE X 2)     Status: None   Collection Time    06/14/13  1:26 AM      Result Value Ref Range Status   Specimen Description BLOOD LEFT HAND   Final   Special Requests BOTTLES DRAWN AEROBIC AND ANAEROBIC 10CC   Final   Culture  Setup Time     Final   Value: 06/14/2013 08:42     Performed at [facility]   Culture     Final   Value:        BLOOD CULTURE RECEIVED NO GROWTH TO DATE CULTURE WILL BE HELD FOR 5 DAYS BEFORE ISSUING A FINAL NEGATIVE REPORT     Performed at [facility]   Report Status PENDING   Incomplete  MRSA PCR SCREENING     Status: None   Collection Time    06/14/13  6:29 AM      Result Value Ref Range Status   MRSA by PCR NEGATIVE  NEGATIVE Final    Comment:            The GeneXpert MRSA Assay (FDA     approved for NASAL specimens     only), is one component of a     comprehensive MRSA colonization     surveillance program. It is not     intended to diagnose MRSA     infection nor to guide or     monitor treatment for     MRSA infections.  AFB CULTURE WITH SMEAR     Status: None   Collection Time    06/14/13 11:50 AM      Result Value Ref Range Status   Specimen Description LUNG SPUTUM   Final   Special Requests NONE   Final   Acid Fast Smear     Final   Value: NO ACID FAST BACILLI SEEN     Performed at [facility]   Culture     Final   Value: CULTURE WILL BE EXAMINED FOR 6 WEEKS BEFORE ISSUING A FINAL REPORT     Performed at [facility]   Report Status PENDING   Incomplete  AFB CULTURE WITH SMEAR     Status: None   Collection Time    06/15/13  4:24 PM      Result Value Ref Range Status   Specimen Description LUNG   Final   Special Requests Normal   Final   Acid Fast Smear     Final   Value: NO ACID FAST BACILLI SEEN     Performed at [facility]   Culture     Final   Value: CULTURE WILL BE EXAMINED FOR 6 WEEKS BEFORE ISSUING A FINAL REPORT     Performed at [facility]   Report Status PENDING   Incomplete  AFB CULTURE WITH SMEAR  Status: None   Collection Time    06/16/13 10:08 AM      Result Value Ref Range Status   Specimen Description SPUTUM   Final   Special Requests Normal   Final   Acid Fast Smear     Final   Value: NO ACID FAST BACILLI SEEN     Performed at [facility]   Culture     Final   Value: CULTURE WILL BE EXAMINED FOR 6 WEEKS BEFORE ISSUING A FINAL REPORT     Performed at [facility]   Report Status PENDING   Incomplete  CULTURE, BLOOD (ROUTINE X 2)     Status: None   Collection Time    06/17/13  5:25 PM      Result Value Ref Range Status   Specimen Description BLOOD RIGHT HAND   Final   Special Requests BOTTLES DRAWN AEROBIC ONLY 10CC    Final   Culture  Setup Time     Final   Value: 06/17/2013 22:50     Performed at [facility]   Culture     Final   Value:        BLOOD CULTURE RECEIVED NO GROWTH TO DATE CULTURE WILL BE HELD FOR 5 DAYS BEFORE ISSUING A FINAL NEGATIVE REPORT     Performed at [facility]   Report Status PENDING   Incomplete  CULTURE, BLOOD (ROUTINE X 2)     Status: None   Collection Time    06/17/13  5:30 PM      Result Value Ref Range Status   Specimen Description BLOOD LEFT HAND   Final   Special Requests BOTTLES DRAWN AEROBIC ONLY 3CC   Final   Culture  Setup Time     Final   Value: 06/17/2013 22:50     Performed at [facility]   Culture     Final   Value:        BLOOD CULTURE RECEIVED NO GROWTH TO DATE CULTURE WILL BE HELD FOR 5 DAYS BEFORE ISSUING A FINAL NEGATIVE REPORT     Performed at [facility]   Report Status PENDING   Incomplete  CULTURE, EXPECTORATED SPUTUM-ASSESSMENT     Status: None   Collection Time    06/18/13  1:36 PM      Result Value Ref Range Status   Specimen Description SPUTUM   Final   Special Requests Normal   Final   Sputum evaluation     Final   Value: THIS SPECIMEN IS ACCEPTABLE. RESPIRATORY CULTURE REPORT TO FOLLOW.   Report Status 06/18/2013 FINAL   Final  CULTURE, RESPIRATORY (NON-EXPECTORATED)     Status: None   Collection Time    06/18/13  1:36 PM      Result Value Ref Range Status   Specimen Description SPUTUM   Final   Special Requests NONE   Final   Gram Stain     Final   Value: ABUNDANT WBC PRESENT,BOTH PMN AND MONONUCLEAR     NO SQUAMOUS EPITHELIAL CELLS SEEN     RARE GRAM POSITIVE COCCI     IN PAIRS     Performed at [facility]   Culture     Final   Value: FEW CANDIDA ALBICANS     Performed at [facility]   Report Status PENDING   Incomplete    Anti-infectives   Start     Dose/Rate Route Frequency Ordered Stop   06/17/13  1800  piperacillin-tazobactam (ZOSYN) IVPB 3.375 g     3.375 g 12.5  mL/hr over 240 Minutes Intravenous Every 8 hours 06/17/13 1633     06/16/13 2247  vancomycin (VANCOCIN) 1,250 mg in sodium chloride 0.9 % 250 mL IVPB     1,250 mg 166.7 mL/hr over 90 Minutes Intravenous Every 12 hours 06/16/13 2247     06/16/13 2200  Ampicillin-Sulbactam (UNASYN) 3 g in sodium chloride 0.9 % 100 mL IVPB  Status:  Discontinued     3 g 100 mL/hr over 60 Minutes Intravenous 4 times per day 06/16/13 1725 06/17/13 1627   06/15/13 0100  cefTRIAXone (ROCEPHIN) 1 g in dextrose 5 % 50 mL IVPB  Status:  Discontinued     1 g 100 mL/hr over 30 Minutes Intravenous Every 24 hours 06/14/13 0059 06/14/13 0820   06/15/13 0100  azithromycin (ZITHROMAX) 500 mg in dextrose 5 % 250 mL IVPB  Status:  Discontinued     500 mg 250 mL/hr over 60 Minutes Intravenous Every 24 hours 06/14/13 0059 06/14/13 0820   06/14/13 1000  vancomycin (VANCOCIN) IVPB 750 mg/150 ml premix  Status:  Discontinued     750 mg 150 mL/hr over 60 Minutes Intravenous Every 12 hours 06/14/13 0846 06/16/13 2247   06/14/13 0900  Ampicillin-Sulbactam (UNASYN) 3 g in sodium chloride 0.9 % 100 mL IVPB  Status:  Discontinued     3 g 100 mL/hr over 60 Minutes Intravenous 4 times per day 06/14/13 0820 06/16/13 1725   06/14/13 0030  azithromycin (ZITHROMAX) 500 mg in dextrose 5 % 250 mL IVPB     500 mg 250 mL/hr over 60 Minutes Intravenous  Once 06/14/13 0016 06/14/13 0234   06/13/13 2015  cefTRIAXone (ROCEPHIN) 1 g in dextrose 5 % 50 mL IVPB     1 g 100 mL/hr over 30 Minutes Intravenous  Once 06/13/13 2000 06/13/13 2149      Assessment: 53 y/o male on day 6 vancomycin and day 3 Zosyn (antibiotic day 7) for pneumonia and pulmonary abscess. ID following. Renal function is stable. He is afebrile, WBC are trending down, and cultures are neg thus far. Vancomycin trough is therapeutic. Spoke with RN about vancomycin not being given and she was unaware vancomycin and Zosyn were compatible. Asked her to give vancomycin and I adjusted the  timing.  Rocephin x 1 dose Azithromycin x 1 dose  Unasyn 6/12>>6/15 Vancomycin 6/12>> 6/14 VT 6.6 (drawn correctly), incr to 1250 mg q12h 6/17 VT 15.2 - cont Zosyn 6/15>>  6/16 Sputum - few Candida 6/15 BCx2 - ngtd  6/14 AFB - smear negative; cx >>IP 6/13 AFB - smear negative; cx >>IP 6/12 AFB - smear negative; cx >>IP 6/12 Blood Cx x2>>ngtd 6/11 Urine Cx >>ng Final 6/11 Blood Cx x2>> ngtd  Goal of Therapy:  Vancomycin trough level 15-20 mcg/ml Eradication of infection  Plan:  - Continue vancomycin 1250 mg IV q12h - Continue Zosyn 3.375 g IV q8h to be infused over 4 hours - Monitor renal function, clinical course, and [employer], [address].D., BCPS Clinical Pharmacist Pager: [phone] 06/19/2013 1:16 PM

## 2013-06-19 NOTE — Progress Notes (Signed)
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       1. Pneumonia with cavitary lung lesion, suspected abscess; still intermittent febrile; cont IV atx, vanc, Unasyn>zosyn on 6/15, vanc; blood cultures NGTD; prelim AFB neg; Sputum growing some candida, CT scan noted will discuss with pulmonary to see if they have to provide any input, Continue supportive care for cough, will defer antibiotics to ID following.Clinically improving.     2. Sepsis/pneumonia, present on admission cont as above; Sepsis resolved.     3. COPD/emphysema; tobacco use cont bronchodilators prn; oxygen; Patient counseled to stop smoking.     4. ? H/O seizures; no  seizures for years per patient; cont home regimen      5. S/p PPM; stable; monitor      6. Elevated alk phos, bili;  liver US: unremarkable, s/op cholecystectomy;  repeat CMP improving; Continue outpatient monitoring and workup is needed.     7.Low K - replace and monitor.        Code Status: full Family Communication:  D/w patient, his mother, father  (indicate person spoken with, relationship, and if by phone, the number) Disposition Plan: home pend clinical improvement     Consultants:  ID  Procedures:  none   Antibiotics:  unasyn 6/12<<<<<6/15  vanc 6/12<<<<<  Zosyn 6/15<<<<<<  (indicate start date, and stop date if known)    HPI/Subjective:  In bed, denies any headache, Mild post cough musculoskeletal chest pain , No abdominal pain, continues to have cough but shortness of breath is improved.  Objective: Filed Vitals:   06/19/13 0651  BP: 115/61  Pulse: 67  Temp: 98.5 F (36.9 C)  Resp: 20     Intake/Output Summary (Last 24 hours) at 06/19/13 1236 Last data filed at 06/19/13 1224  Gross per 24 hour  Intake    240 ml  Output    675 ml  Net   -435 ml   Filed Weights   06/14/13 0700  Weight: 57.6 kg (126 lb 15.8 oz)    Exam:   General:  alert  Cardiovascular: s1,s2 rrr  Respiratory: few rales Bilateral lower Lobes  Abdomen: soft, nt,nd   Musculoskeletal: no LE edema   Data Reviewed: Basic Metabolic Panel:  Recent Labs Lab 06/13/13 1855 06/15/13 0447 06/17/13 0637 06/19/13 0416  NA 128* 138 138 136*  K 3.6* 3.7 3.5* 3.1*  CL 87* 97 99 98  CO2 '24 29 27 26  ' GLUCOSE 118* 132* 90 102*  BUN '19 14 10 6  ' CREATININE 0.95 0.88 0.65 0.75  CALCIUM 8.3* 8.4 8.1* 7.8*   Liver Function Tests:  Recent Labs Lab 06/13/13 2032 06/17/13 0637  AST 53* 26  ALT 31 21  ALKPHOS 350* 318*  BILITOT 1.7* 1.2  PROT 6.6 6.0  ALBUMIN 2.2* 1.7*    Recent Labs Lab 06/17/13 0637  LIPASE 18   No results found for this basename: AMMONIA,  in the last 168 hours CBC:  Recent Labs Lab 06/13/13 1855 06/15/13 0447 06/17/13 0637 06/19/13 0416  WBC 16.6* 13.2* 13.7* 12.4*  NEUTROABS  --  10.3*  --   --   HGB 11.4* 10.4* 10.3* 8.6*  HCT 32.8* 30.7* 30.2* 25.1*  MCV 96.2 97.2 95.9  95.4  PLT 257 278 391 421*   Cardiac Enzymes: No results found for this basename: CKTOTAL, CKMB, CKMBINDEX, TROPONINI,  in the last 168 hours BNP (last 3 results)  Recent Labs  06/13/13 2032  PROBNP 235.4*   CBG:  Recent Labs Lab 06/14/13 0623  GLUCAP 141*    Recent Results (from the past 240 hour(s))  CULTURE, BLOOD (ROUTINE X 2)     Status: None   Collection Time    06/13/13  8:32 PM      Result Value Ref Range Status   Specimen Description BLOOD HAND RIGHT   Final   Special Requests BOTTLES DRAWN AEROBIC ONLY 3CC   Final   Culture  Setup Time     Final   Value: 06/14/2013 04:35     Performed at [facility]   Culture     Final   Value:        BLOOD  CULTURE RECEIVED NO GROWTH TO DATE CULTURE WILL BE HELD FOR 5 DAYS BEFORE ISSUING A FINAL NEGATIVE REPORT     Performed at [facility]   Report Status PENDING   Incomplete  CULTURE, BLOOD (ROUTINE X 2)     Status: None   Collection Time    06/13/13  8:43 PM      Result Value Ref Range Status   Specimen Description BLOOD ARM LEFT   Final   Special Requests BOTTLES DRAWN AEROBIC AND ANAEROBIC 5CC   Final   Culture  Setup Time     Final   Value: 06/14/2013 04:36     Performed at [facility]   Culture     Final   Value:        BLOOD CULTURE RECEIVED NO GROWTH TO DATE CULTURE WILL BE HELD FOR 5 DAYS BEFORE ISSUING A FINAL NEGATIVE REPORT     Performed at [facility]   Report Status PENDING   Incomplete  URINE CULTURE     Status: None   Collection Time    06/13/13  9:46 PM      Result Value Ref Range Status   Specimen Description URINE, CATHETERIZED   Final   Special Requests NONE   Final   Culture  Setup Time     Final   Value: 06/14/2013 00:38     Performed at [facility]     Final   Value: NO GROWTH     Performed at [facility]   Culture     Final   Value: NO GROWTH     Performed at [facility]   Report Status 06/15/2013 FINAL   Final  CULTURE, BLOOD (ROUTINE X 2)     Status: None   Collection Time    06/14/13  1:15 AM      Result Value Ref Range Status   Specimen Description BLOOD RIGHT ARM   Final   Special Requests BOTTLES DRAWN AEROBIC AND ANAEROBIC 10CC   Final   Culture  Setup Time     Final   Value: 06/14/2013 08:42     Performed at [facility]   Culture     Final   Value:        BLOOD CULTURE RECEIVED NO GROWTH TO DATE CULTURE WILL BE HELD FOR 5 DAYS BEFORE ISSUING A FINAL NEGATIVE REPORT     Performed at [facility]   Report Status PENDING   Incomplete  CULTURE, BLOOD (ROUTINE  X 2)     Status: None   Collection Time    06/14/13  1:26 AM      Result Value Ref Range Status    Specimen Description BLOOD LEFT HAND   Final   Special Requests BOTTLES DRAWN AEROBIC AND ANAEROBIC 10CC   Final   Culture  Setup Time     Final   Value: 06/14/2013 08:42     Performed at [facility]   Culture     Final   Value:        BLOOD CULTURE RECEIVED NO GROWTH TO DATE CULTURE WILL BE HELD FOR 5 DAYS BEFORE ISSUING A FINAL NEGATIVE REPORT     Performed at [facility]   Report Status PENDING   Incomplete  MRSA PCR SCREENING     Status: None   Collection Time    06/14/13  6:29 AM      Result Value Ref Range Status   MRSA by PCR NEGATIVE  NEGATIVE Final   Comment:            The GeneXpert MRSA Assay (FDA     approved for NASAL specimens     only), is one component of a     comprehensive MRSA colonization     surveillance program. It is not     intended to diagnose MRSA     infection nor to guide or     monitor treatment for     MRSA infections.  AFB CULTURE WITH SMEAR     Status: None   Collection Time    06/14/13 11:50 AM      Result Value Ref Range Status   Specimen Description LUNG SPUTUM   Final   Special Requests NONE   Final   Acid Fast Smear     Final   Value: NO ACID FAST BACILLI SEEN     Performed at [facility]   Culture     Final   Value: CULTURE WILL BE EXAMINED FOR 6 WEEKS BEFORE ISSUING A FINAL REPORT     Performed at [facility]   Report Status PENDING   Incomplete  AFB CULTURE WITH SMEAR     Status: None   Collection Time    06/15/13  4:24 PM      Result Value Ref Range Status   Specimen Description LUNG   Final   Special Requests Normal   Final   Acid Fast Smear     Final   Value: NO ACID FAST BACILLI SEEN     Performed at [facility]   Culture     Final   Value: CULTURE WILL BE EXAMINED FOR 6 WEEKS BEFORE ISSUING A FINAL REPORT     Performed at [facility]   Report Status PENDING   Incomplete  AFB CULTURE WITH SMEAR     Status: None   Collection Time    06/16/13 10:08 AM      Result Value  Ref Range Status   Specimen Description SPUTUM   Final   Special Requests Normal   Final   Acid Fast Smear     Final   Value: NO ACID FAST BACILLI SEEN     Performed at [facility]   Culture     Final   Value: CULTURE WILL BE EXAMINED FOR 6 WEEKS BEFORE ISSUING A FINAL REPORT     Performed at [facility]   Report Status PENDING  Incomplete  CULTURE, BLOOD (ROUTINE X 2)     Status: None   Collection Time    06/17/13  5:25 PM      Result Value Ref Range Status   Specimen Description BLOOD RIGHT HAND   Final   Special Requests BOTTLES DRAWN AEROBIC ONLY 10CC   Final   Culture  Setup Time     Final   Value: 06/17/2013 22:50     Performed at [facility]   Culture     Final   Value:        BLOOD CULTURE RECEIVED NO GROWTH TO DATE CULTURE WILL BE HELD FOR 5 DAYS BEFORE ISSUING A FINAL NEGATIVE REPORT     Performed at [facility]   Report Status PENDING   Incomplete  CULTURE, BLOOD (ROUTINE X 2)     Status: None   Collection Time    06/17/13  5:30 PM      Result Value Ref Range Status   Specimen Description BLOOD LEFT HAND   Final   Special Requests BOTTLES DRAWN AEROBIC ONLY 3CC   Final   Culture  Setup Time     Final   Value: 06/17/2013 22:50     Performed at [facility]   Culture     Final   Value:        BLOOD CULTURE RECEIVED NO GROWTH TO DATE CULTURE WILL BE HELD FOR 5 DAYS BEFORE ISSUING A FINAL NEGATIVE REPORT     Performed at [facility]   Report Status PENDING   Incomplete  CULTURE, EXPECTORATED SPUTUM-ASSESSMENT     Status: None   Collection Time    06/18/13  1:36 PM      Result Value Ref Range Status   Specimen Description SPUTUM   Final   Special Requests Normal   Final   Sputum evaluation     Final   Value: THIS SPECIMEN IS ACCEPTABLE. RESPIRATORY CULTURE REPORT TO FOLLOW.   Report Status 06/18/2013 FINAL   Final  CULTURE, RESPIRATORY (NON-EXPECTORATED)     Status: None   Collection Time    06/18/13  1:36 PM       Result Value Ref Range Status   Specimen Description SPUTUM   Final   Special Requests NONE   Final   Gram Stain     Final   Value: ABUNDANT WBC PRESENT,BOTH PMN AND MONONUCLEAR     NO SQUAMOUS EPITHELIAL CELLS SEEN     RARE GRAM POSITIVE COCCI     IN PAIRS     Performed at [facility]   Culture     Final   Value: FEW CANDIDA ALBICANS     Performed at [facility]   Report Status PENDING   Incomplete     Studies: Dg Chest 2 View  06/18/2013   CLINICAL DATA:  Short of breath.  Cough.  Re-evaluate pneumonia.  EXAM: CHEST  2 VIEW  COMPARISON:  Chest CT and chest radiograph, 06/13/2013  FINDINGS: Consolidation in the left upper lobe has mildly increased, extending more inferiorly. Lateral view demonstrates mild consolidation in the anterior left lower lobe.  More dense consolidation with associated bullae in the left upper lobe at the apex is similar.  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 No right lung consolidation.  Minimal left pleural effusion.  No pneumothorax.  Cardiac silhouette is normal in size. Left upper lobe consolidation silhouettes the aortic arch and superior  left hilum. No convincing mediastinal or hilar mass.  Stable left anterior chest wall sequential pacemaker, well positioned.  IMPRESSION: 1. Left upper lobe pneumonia has increased with consolidation extending more inferiorly in the left upper lobe and a small amount of consolidation are noted in the left anterior lower lobe. 2. No other change.   Electronically Signed   By: Lajean Manes M.D.   On: 06/18/2013 08:48   Ct Chest Wo Contrast  06/18/2013   CLINICAL DATA:  Non resolving pneumonia  EXAM: CT CHEST WITHOUT CONTRAST  TECHNIQUE: Multidetector CT imaging of the chest was performed following the standard protocol without IV contrast. Sagittal and coronal MPR images reconstructed from axial data set.  COMPARISON:  06/17/2013  FINDINGS: LEFT subclavian transvenous  pacemaker leads project at RIGHT atrium and RIGHT ventricle.  Minimal pericardial effusion.  Upper normal size of ascending thoracic aorta 3.8 x 3.8 cm image 31.  Small BILATERAL pleural effusions.  Post cholecystectomy.  Remaining visualized portions of upper abdomen unremarkable.  Severe emphysematous changes with bullae at RIGHT apex.  Large cavitary foci LEFT upper lobe identified containing small air-fluid levels and wall thickening.  Extensive airspace infiltrate in LEFT upper lobe and lingula, slightly increased.  Mild infiltrate at RIGHT lower lobe and minimally in RIGHT middle lobe, new since previous exam.  No discrete pulmonary mass/nodule.  Osseous structures unremarkable.  IMPRESSION: Extensive LEFT upper lobe and lingular consolidation consistent with pneumonia, slightly increased since previous exam.  Scattered small air-fluid levels within bullae with wall thickening at LEFT upper lobe.  Less severe infiltrate in RIGHT lower lobe and minimally RIGHT middle lobe.  Small BILATERAL pleural effusions and minimal pericardial effusion.   Electronically Signed   By: Lavonia Dana M.D.   On: 06/18/2013 17:01    Scheduled Meds: . carbamazepine  200 mg Oral BID  . feeding supplement (RESOURCE BREEZE)  1 Container Oral TID BM  . FLUoxetine  20 mg Oral BID  . heparin  5,000 Units Subcutaneous 3 times per day  . ipratropium  0.5 mg Nebulization BID  . mometasone-formoterol  2 puff Inhalation BID  . pantoprazole  40 mg Oral Daily  . piperacillin-tazobactam (ZOSYN)  IV  3.375 g Intravenous Q8H  . potassium chloride  40 mEq Oral Q4H  . vancomycin  1,250 mg Intravenous Q12H   Continuous Infusions:    Principal Problem:   COPD exacerbation Active Problems:   Symptomatic bradycardia   Pacemaker - Medtronic 2005, generator change September 2014   CAP (community acquired pneumonia)   Protein-calorie malnutrition, severe    Time spent: >35 minutes     [facility] Pager [phone]. If 7PM-7AM, please contact night-coverage at [email or web address], password [facility]  06/19/2013, 12:36 PM  LOS: 6 days

## 2013-06-19 NOTE — Progress Notes (Signed)
[facility]    Date of Admission:  06/13/2013   Total days of antibiotics 7        Day 3 piptazo        Day 6 vanco           ID: Michael [name] is a 53 y.o. 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 Admitted for  have lul infiltrate with possible cavitary lesions. 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 On vanco and unasyn. 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 carbamazepine  200 mg Oral BID  . feeding supplement (RESOURCE BREEZE)  1 Container Oral TID BM  . FLUoxetine  20 mg Oral BID  . heparin  5,000 Units Subcutaneous 3 times per day  . ipratropium  0.5 mg Nebulization BID  . mometasone-formoterol  2 puff Inhalation BID  . pantoprazole  40 mg Oral Daily  . piperacillin-tazobactam (ZOSYN)  IV  3.375 g Intravenous Q8H  . potassium chloride  40 mEq Oral Q4H  . vancomycin  1,250 mg Intravenous Q12H    Objective: Vital signs in last 24 hours: Temp:  [98 F (36.7 C)-99.1 F (37.3 C)] 98 F (36.7 C) (06/17 1257) Pulse Rate:  [60-82] 60 (06/17 1257) Resp:  [18-22] 20 (06/17 1257) BP: (102-124)/(48-61) 102/58 mmHg (06/17 1257) SpO2:  [90 %-98 %] 96 % (06/17 1257) Constitutional: He is oriented to person, place, and time. Appears chronically ill. No distress. diaphoretic  HENT: poor dentition  Mouth/Throat: Oropharynx is clear and moist. No oropharyngeal exudate.  Cardiovascular: Normal rate, regular rhythm and normal heart sounds. Exam reveals no gallop and no friction rub.  No murmur heard.    Pulmonary/Chest: Effort normal and breath sounds normal. No respiratory distress. He has no wheezes.  Abdominal: Soft. Bowel sounds are normal. He exhibits no distension. There is no tenderness.  Lymphadenopathy:  He has no cervical adenopathy.     Lab Results  Recent Labs  06/17/13 0637 06/19/13 0416  WBC 13.7* 12.4*  HGB 10.3* 8.6*  HCT 30.2* 25.1*  NA 138 136*  K 3.5* 3.1*  CL 99 98  CO2 27 26  BUN 10 6  CREATININE 0.65 0.75   Liver Panel  Recent Labs  06/17/13 0637  PROT 6.0  ALBUMIN 1.7*  AST 26  ALT 21  ALKPHOS 318*  BILITOT 1.2    Microbiology: 6/15 blood cx ngtd 6/16  Sputum cx pending Studies/Results: Dg Chest 2 View  06/18/2013   CLINICAL DATA:  Short of breath.  Cough.  Re-evaluate pneumonia.  EXAM: CHEST  2 VIEW  COMPARISON:  Chest CT and chest radiograph, 06/13/2013  FINDINGS: Consolidation in the left upper lobe has mildly increased, extending more inferiorly. Lateral view demonstrates mild consolidation in the anterior left lower lobe.  More dense consolidation with associated bullae in the left upper lobe at the apex is similar.  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 No right lung consolidation.  Minimal left pleural effusion.  No pneumothorax.  Cardiac silhouette is normal  in size. Left upper lobe consolidation silhouettes the aortic arch and superior left hilum. No convincing mediastinal or hilar mass.  Stable left anterior chest wall sequential pacemaker, well positioned.  IMPRESSION: 1. Left upper lobe pneumonia has increased with consolidation extending more inferiorly in the left upper lobe and a small amount of consolidation are noted in the left anterior lower lobe. 2. No other change.   Electronically Signed   By: Amie Portlandavid  Ormond M.D.   On: 06/18/2013 08:48   Ct Chest Wo Contrast  06/18/2013   CLINICAL DATA:  Non resolving pneumonia  EXAM: CT CHEST WITHOUT CONTRAST  TECHNIQUE: Multidetector CT imaging of the  chest was performed following the standard protocol without IV contrast. Sagittal and coronal MPR images reconstructed from axial data set.  COMPARISON:  06/17/2013  FINDINGS: LEFT subclavian transvenous pacemaker leads project at RIGHT atrium and RIGHT ventricle.  Minimal pericardial effusion.  Upper normal size of ascending thoracic aorta 3.8 x 3.8 cm image 31.  Small BILATERAL pleural effusions.  Post cholecystectomy.  Remaining visualized portions of upper abdomen unremarkable.  Severe emphysematous changes with bullae at RIGHT apex.  Large cavitary foci LEFT upper lobe identified containing small air-fluid levels and wall thickening.  Extensive airspace infiltrate in LEFT upper lobe and lingula, slightly increased.  Mild infiltrate at RIGHT lower lobe and minimally in RIGHT middle lobe, new since previous exam.  No discrete pulmonary mass/nodule.  Osseous structures unremarkable.  IMPRESSION: Extensive LEFT upper lobe and lingular consolidation consistent with pneumonia, slightly increased since previous exam.  Scattered small air-fluid levels within bullae with wall thickening at LEFT upper lobe.  Less severe infiltrate in RIGHT lower lobe and minimally RIGHT middle lobe.  Small BILATERAL pleural effusions and minimal pericardial effusion.   Electronically Signed   By: Ulyses SouthwardMark  Boles M.D.   On: 06/18/2013 17:01     Assessment/Plan: Pulmonary abscess = afebrile since changing to piptazo. Continue on current regimen Will likely need prolonged treatment of antibiotics. Recommend to treat for 2-3 wks with IV antibiotics then change to orals  [facility] CityNIDER, [facility] CenterCYNTHIA [facility] Cell: [phone] Pager: [phone]  06/19/2013, 2:22 PM

## 2013-06-20 LAB — BASIC METABOLIC PANEL
BUN: 6 mg/dL (ref 6–23)
CHLORIDE: 103 meq/L (ref 96–112)
CO2: 27 mEq/L (ref 19–32)
CREATININE: 0.76 mg/dL (ref 0.50–1.35)
Calcium: 8 mg/dL — ABNORMAL LOW (ref 8.4–10.5)
GFR calc non Af Amer: >90 mL/min (ref >90)
Glucose, Bld: 91 mg/dL (ref 70–99)
POTASSIUM: 4.1 meq/L (ref 3.7–5.3)
Sodium: 140 mEq/L (ref 137–147)

## 2013-06-20 LAB — CULTURE, BLOOD (ROUTINE X 2)
CULTURE: NO GROWTH
CULTURE: NO GROWTH
Culture: NO GROWTH
Culture: NO GROWTH

## 2013-06-20 LAB — CULTURE, RESPIRATORY

## 2013-06-20 LAB — CULTURE, RESPIRATORY W GRAM STAIN

## 2013-06-20 NOTE — Progress Notes (Signed)
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       1. Pneumonia with cavitary lung lesion, suspected abscess; still intermittent febrile; cont IV atx, vanc, Unasyn>zosyn on 6/15, vanc; blood cultures NGTD; prelim AFB neg; Sputum growing some candida, CT scan noted have discussed with pulmonary physician Dr. Chase Caller in detail, he recommends outpatient followup in the office with a total of 6 weeks of antibiotics which could be oral as well,  Continue supportive care for cough, will defer antibiotics to ID following. Clinically improving. Likely require home oxygen upon discharge.     2. Sepsis/pneumonia, present on admission cont as above; Sepsis resolved.     3. COPD/emphysema; tobacco use cont bronchodilators prn; oxygen; Patient counseled to stop smoking.     4. ? H/O seizures; no  seizures for years per patient; cont home regimen      5. S/p PPM; stable; monitor      6. Elevated alk phos, bili;  liver US: unremarkable, s/op cholecystectomy;  repeat CMP improving; Continue outpatient monitoring and workup is needed.     7.Low K - replace and monitor.        Code Status: full Family Communication:  D/w patient, his mother bedside.   Disposition Plan: home pend clinical improvement     Consultants:  ID, pulmonary Dr. Chase Caller over the phone who reviewed his CT scan in detail    Procedures:  none   Antibiotics:  unasyn 6/12<<<<<6/15  vanc 6/12<<<<<  Zosyn 6/15<<  (indicate start date, and stop date if known)    HPI/Subjective:  In bed, denies any headache, Mild post cough musculoskeletal chest pain , No abdominal pain, continues to  have cough but shortness of breath is improved.  Objective: Filed Vitals:   06/20/13 0555  BP: 126/77  Pulse: 72  Temp: 98.5 F (36.9 C)  Resp: 18    Intake/Output Summary (Last 24 hours) at 06/20/13 0951 Last data filed at 06/20/13 0556  Gross per 24 hour  Intake    480 ml  Output    925 ml  Net   -445 ml   Filed Weights   06/14/13 0700  Weight: 57.6 kg (126 lb 15.8 oz)    Exam:   General:  alert  Cardiovascular: s1,s2 rrr  Respiratory: few rales Bilateral lower Lobes  Abdomen: soft, nt,nd   Musculoskeletal: no LE edema   Data Reviewed: Basic Metabolic Panel:  Recent Labs Lab 06/13/13 1855 06/15/13 0447 06/17/13 0637 06/19/13 0416 06/20/13 0559  NA 128* 138 138 136* 140  K 3.6* 3.7 3.5* 3.1* 4.1  CL 87* 97 99 98 103  CO2 '24 29 27 26 27  ' GLUCOSE 118* 132* 90 102* 91  BUN '19 14 10 6 6  ' CREATININE 0.95 0.88 0.65 0.75 0.76  CALCIUM 8.3* 8.4 8.1* 7.8* 8.0*   Liver Function Tests:  Recent Labs Lab 06/13/13 2032 06/17/13 0637  AST 53* 26  ALT 31 21  ALKPHOS 350* 318*  BILITOT 1.7* 1.2  PROT 6.6 6.0  ALBUMIN 2.2* 1.7*    Recent Labs Lab 06/17/13 0637  LIPASE 18   No results found for this basename: AMMONIA,  in the last 168 hours CBC:  Recent Labs Lab 06/13/13 1855  06/15/13 0447 06/17/13 0637 06/19/13 0416  WBC 16.6* 13.2* 13.7* 12.4*  NEUTROABS  --  10.3*  --   --   HGB 11.4* 10.4* 10.3* 8.6*  HCT 32.8* 30.7* 30.2* 25.1*  MCV 96.2 97.2 95.9 95.4  PLT 257 278 391 421*   Cardiac Enzymes: No results found for this basename: CKTOTAL, CKMB, CKMBINDEX, TROPONINI,  in the last 168 hours BNP (last 3 results)  Recent Labs  06/13/13 2032  PROBNP 235.4*   CBG:  Recent Labs Lab 06/14/13 0623  GLUCAP 141*    Recent Results (from the past 240 hour(s))  CULTURE, BLOOD (ROUTINE X 2)     Status: None   Collection Time    06/13/13  8:32 PM      Result Value Ref Range Status   Specimen Description BLOOD HAND RIGHT   Final    Special Requests BOTTLES DRAWN AEROBIC ONLY 3CC   Final   Culture  Setup Time     Final   Value: 06/14/2013 04:35     Performed at [facility]   Culture     Final   Value:        BLOOD CULTURE RECEIVED NO GROWTH TO DATE CULTURE WILL BE HELD FOR 5 DAYS BEFORE ISSUING A FINAL NEGATIVE REPORT     Performed at [facility]   Report Status PENDING   Incomplete  CULTURE, BLOOD (ROUTINE X 2)     Status: None   Collection Time    06/13/13  8:43 PM      Result Value Ref Range Status   Specimen Description BLOOD ARM LEFT   Final   Special Requests BOTTLES DRAWN AEROBIC AND ANAEROBIC 5CC   Final   Culture  Setup Time     Final   Value: 06/14/2013 04:36     Performed at [facility]   Culture     Final   Value:        BLOOD CULTURE RECEIVED NO GROWTH TO DATE CULTURE WILL BE HELD FOR 5 DAYS BEFORE ISSUING A FINAL NEGATIVE REPORT     Performed at [facility]   Report Status PENDING   Incomplete  URINE CULTURE     Status: None   Collection Time    06/13/13  9:46 PM      Result Value Ref Range Status   Specimen Description URINE, CATHETERIZED   Final   Special Requests NONE   Final   Culture  Setup Time     Final   Value: 06/14/2013 00:38     Performed at [facility]     Final   Value: NO GROWTH     Performed at [facility]   Culture     Final   Value: NO GROWTH     Performed at [facility]   Report Status 06/15/2013 FINAL   Final  CULTURE, BLOOD (ROUTINE X 2)     Status: None   Collection Time    06/14/13  1:15 AM      Result Value Ref Range Status   Specimen Description BLOOD RIGHT ARM   Final   Special Requests BOTTLES DRAWN AEROBIC AND ANAEROBIC 10CC   Final   Culture  Setup Time     Final   Value: 06/14/2013 08:42     Performed at [facility]   Culture     Final   Value:  BLOOD CULTURE RECEIVED NO GROWTH TO DATE CULTURE WILL BE HELD FOR 5 DAYS BEFORE ISSUING A FINAL NEGATIVE REPORT      Performed at [facility]   Report Status PENDING   Incomplete  CULTURE, BLOOD (ROUTINE X 2)     Status: None   Collection Time    06/14/13  1:26 AM      Result Value Ref Range Status   Specimen Description BLOOD LEFT HAND   Final   Special Requests BOTTLES DRAWN AEROBIC AND ANAEROBIC 10CC   Final   Culture  Setup Time     Final   Value: 06/14/2013 08:42     Performed at [facility]   Culture     Final   Value:        BLOOD CULTURE RECEIVED NO GROWTH TO DATE CULTURE WILL BE HELD FOR 5 DAYS BEFORE ISSUING A FINAL NEGATIVE REPORT     Performed at [facility]   Report Status PENDING   Incomplete  MRSA PCR SCREENING     Status: None   Collection Time    06/14/13  6:29 AM      Result Value Ref Range Status   MRSA by PCR NEGATIVE  NEGATIVE Final   Comment:            The GeneXpert MRSA Assay (FDA     approved for NASAL specimens     only), is one component of a     comprehensive MRSA colonization     surveillance program. It is not     intended to diagnose MRSA     infection nor to guide or     monitor treatment for     MRSA infections.  AFB CULTURE WITH SMEAR     Status: None   Collection Time    06/14/13 11:50 AM      Result Value Ref Range Status   Specimen Description LUNG SPUTUM   Final   Special Requests NONE   Final   Acid Fast Smear     Final   Value: NO ACID FAST BACILLI SEEN     Performed at [facility]   Culture     Final   Value: CULTURE WILL BE EXAMINED FOR 6 WEEKS BEFORE ISSUING A FINAL REPORT     Performed at [facility]   Report Status PENDING   Incomplete  AFB CULTURE WITH SMEAR     Status: None   Collection Time    06/15/13  4:24 PM      Result Value Ref Range Status   Specimen Description LUNG   Final   Special Requests Normal   Final   Acid Fast Smear     Final   Value: NO ACID FAST BACILLI SEEN     Performed at [facility]   Culture     Final   Value: CULTURE WILL BE EXAMINED FOR 6 WEEKS BEFORE  ISSUING A FINAL REPORT     Performed at [facility]   Report Status PENDING   Incomplete  AFB CULTURE WITH SMEAR     Status: None   Collection Time    06/16/13 10:08 AM      Result Value Ref Range Status   Specimen Description SPUTUM   Final   Special Requests Normal   Final   Acid Fast Smear     Final   Value: NO ACID FAST BACILLI SEEN     Performed at [facility]   Culture     Final   Value: CULTURE WILL BE EXAMINED FOR 6 WEEKS BEFORE ISSUING A FINAL REPORT     Performed at [facility]   Report Status PENDING   Incomplete  CULTURE, BLOOD (ROUTINE X 2)     Status: None   Collection Time    06/17/13  5:25 PM      Result Value Ref Range Status   Specimen Description BLOOD RIGHT HAND   Final   Special Requests BOTTLES DRAWN AEROBIC ONLY 10CC   Final   Culture  Setup Time     Final   Value: 06/17/2013 22:50     Performed at [facility]   Culture     Final   Value:        BLOOD CULTURE RECEIVED NO GROWTH TO DATE CULTURE WILL BE HELD FOR 5 DAYS BEFORE ISSUING A FINAL NEGATIVE REPORT     Performed at [facility]   Report Status PENDING   Incomplete  CULTURE, BLOOD (ROUTINE X 2)     Status: None   Collection Time    06/17/13  5:30 PM      Result Value Ref Range Status   Specimen Description BLOOD LEFT HAND   Final   Special Requests BOTTLES DRAWN AEROBIC ONLY 3CC   Final   Culture  Setup Time     Final   Value: 06/17/2013 22:50     Performed at [facility]   Culture     Final   Value:        BLOOD CULTURE RECEIVED NO GROWTH TO DATE CULTURE WILL BE HELD FOR 5 DAYS BEFORE ISSUING A FINAL NEGATIVE REPORT     Performed at [facility]   Report Status PENDING   Incomplete  AFB CULTURE WITH SMEAR     Status: None   Collection Time    06/18/13  1:36 PM      Result Value Ref Range Status   Specimen Description LUNG   Final   Special Requests Normal   Final   Acid Fast Smear     Final   Value: NO ACID FAST BACILLI SEEN      Performed at [facility]   Culture     Final   Value: CULTURE WILL BE EXAMINED FOR 6 WEEKS BEFORE ISSUING A FINAL REPORT     Performed at [facility]   Report Status PENDING   Incomplete  CULTURE, EXPECTORATED SPUTUM-ASSESSMENT     Status: None   Collection Time    06/18/13  1:36 PM      Result Value Ref Range Status   Specimen Description SPUTUM   Final   Special Requests Normal   Final   Sputum evaluation     Final   Value: THIS SPECIMEN IS ACCEPTABLE. RESPIRATORY CULTURE REPORT TO FOLLOW.   Report Status 06/18/2013 FINAL   Final  CULTURE, RESPIRATORY (NON-EXPECTORATED)     Status: None   Collection Time    06/18/13  1:36 PM      Result Value Ref Range Status   Specimen Description SPUTUM   Final   Special Requests NONE   Final   Gram Stain     Final   Value: ABUNDANT WBC PRESENT,BOTH PMN AND MONONUCLEAR     NO SQUAMOUS EPITHELIAL CELLS SEEN     RARE GRAM POSITIVE COCCI     IN PAIRS     Performed at [facility]   Culture     Final   Value: FEW CANDIDA ALBICANS     Performed at [facility]   Report Status PENDING   Incomplete     Studies: Ct Chest Wo Contrast  06/18/2013   CLINICAL DATA:  Non resolving pneumonia  EXAM: CT CHEST WITHOUT CONTRAST  TECHNIQUE: Multidetector CT imaging of the chest was performed following the standard protocol without IV contrast. Sagittal and coronal MPR images reconstructed from axial data set.  COMPARISON:  06/17/2013  FINDINGS: LEFT subclavian transvenous pacemaker leads project at RIGHT atrium and RIGHT ventricle.  Minimal pericardial effusion.  Upper normal size of ascending thoracic aorta 3.8 x 3.8 cm image 31.  Small BILATERAL pleural effusions.  Post cholecystectomy.  Remaining visualized portions of upper abdomen unremarkable.  Severe emphysematous changes with bullae at RIGHT apex.  Large cavitary foci LEFT upper lobe identified containing small air-fluid levels and wall thickening.  Extensive airspace  infiltrate in LEFT upper lobe and lingula, slightly increased.  Mild infiltrate at RIGHT lower lobe and minimally in RIGHT middle lobe, new since previous exam.  No discrete pulmonary mass/nodule.  Osseous structures unremarkable.  IMPRESSION: Extensive LEFT upper lobe and lingular consolidation consistent with pneumonia, slightly increased since previous exam.  Scattered small air-fluid levels within bullae with wall thickening at LEFT upper lobe.  Less severe infiltrate in RIGHT lower lobe and minimally RIGHT middle lobe.  Small BILATERAL pleural effusions and minimal pericardial effusion.   Electronically Signed   By: Lavonia Dana M.D.   On: 06/18/2013 17:01    Scheduled Meds: . carbamazepine  200 mg Oral BID  . feeding supplement (RESOURCE BREEZE)  1 Container Oral TID BM  . FLUoxetine  20 mg Oral BID  . heparin  5,000 Units Subcutaneous 3 times per day  . ipratropium  0.5 mg Nebulization BID  . mometasone-formoterol  2 puff Inhalation BID  . pantoprazole  40 mg Oral Daily  . piperacillin-tazobactam (ZOSYN)  IV  3.375 g Intravenous Q8H  . 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 If 7PM-7AM, please contact night-coverage at [email or web address], password [facility]  06/20/2013, 9:51 AM  LOS: 7 days

## 2013-06-21 LAB — COMPREHENSIVE METABOLIC PANEL
ALT: 25 U/L (ref 0–53)
AST: 29 U/L (ref 0–37)
Albumin: 1.5 g/dL — ABNORMAL LOW (ref 3.5–5.2)
Alkaline Phosphatase: 352 U/L — ABNORMAL HIGH (ref 39–117)
BUN: 7 mg/dL (ref 6–23)
CALCIUM: 8.2 mg/dL — AB (ref 8.4–10.5)
CHLORIDE: 101 meq/L (ref 96–112)
CO2: 24 mEq/L (ref 19–32)
Creatinine, Ser: 0.83 mg/dL (ref 0.50–1.35)
GFR calc Af Amer: >90 mL/min (ref >90)
Glucose, Bld: 90 mg/dL (ref 70–99)
Potassium: 4.7 mEq/L (ref 3.7–5.3)
SODIUM: 137 meq/L (ref 137–147)
Total Bilirubin: 0.7 mg/dL (ref 0.3–1.2)
Total Protein: 5.9 g/dL — ABNORMAL LOW (ref 6.0–8.3)

## 2013-06-21 LAB — CBC
HCT: 27.7 % — ABNORMAL LOW (ref 39.0–52.0)
HEMOGLOBIN: 9.3 g/dL — AB (ref 13.0–17.0)
MCH: 33.6 pg (ref 26.0–34.0)
MCHC: 33.6 g/dL (ref 30.0–36.0)
MCV: 100 fL (ref 78.0–100.0)
Platelets: 462 10*3/uL — ABNORMAL HIGH (ref 150–400)
RBC: 2.77 MIL/uL — ABNORMAL LOW (ref 4.22–5.81)
RDW: 13.7 % (ref 11.5–15.5)
WBC: 13 10*3/uL — ABNORMAL HIGH (ref 4.0–10.5)

## 2013-06-21 MED ORDER — IPRATROPIUM-ALBUTEROL 0.5-2.5 (3) MG/3ML IN SOLN
3.0000 mL | Freq: Three times a day (TID) | RESPIRATORY_TRACT | Status: DC
  Administered 2013-06-21 – 2013-06-24 (×11): 3 mL via RESPIRATORY_TRACT
  Filled 2013-06-21 (×11): qty 3

## 2013-06-21 MED ORDER — SODIUM CHLORIDE 0.9 % IJ SOLN
10.0000 mL | Freq: Two times a day (BID) | INTRAMUSCULAR | Status: DC
Start: 2013-06-21 — End: 2013-06-24

## 2013-06-21 MED ORDER — SODIUM CHLORIDE 0.9 % IJ SOLN
10.0000 mL | INTRAMUSCULAR | Status: DC | PRN
  Administered 2013-06-24: 10 mL

## 2013-06-21 NOTE — Progress Notes (Signed)
[facility]    Date of Admission:  06/13/2013   Total days of antibiotics 9        Day 5piptazo        Day 8 vanco           ID: Michael PontoRoy S Lancon is a 53 y.o. 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 Admitted for  have lul infiltrate with possible cavitary lesions. 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 On vanco and unasyn. 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 Still feels poorly with nightsweats and cough, decreased appetite  Medications:  . carbamazepine  200 mg Oral BID  . feeding supplement (RESOURCE BREEZE)  1 Container Oral TID BM  . FLUoxetine  20 mg Oral BID  . heparin  5,000 Units Subcutaneous 3 times per day  . ipratropium-albuterol  3 mL Nebulization TID  . mometasone-formoterol  2 puff Inhalation BID  . pantoprazole  40 mg Oral Daily  . piperacillin-tazobactam (ZOSYN)  IV  3.375 g Intravenous Q8H  . vancomycin  1,250 mg Intravenous Q12H    Objective: Vital signs in last 24 hours: Temp:  [97.8 F (36.6 C)-99.5 F (37.5 C)] 98.3 F (36.8 C) (06/19 0957) Pulse Rate:  [65-79] 65 (06/19 0957) Resp:  [18-22] 20 (06/19 0957) BP: (112-143)/(58-80) 115/58 mmHg (06/19 0957) SpO2:  [93 %-99 %] 96 % (06/19 0957) Weight:  [137 lb 11.2 oz (62.46 kg)] 137 lb 11.2 oz (62.46 kg) (06/19 09600605) Constitutional: He is oriented to person, place, and time. Appears chronically ill. No distress. diaphoretic  HENT: poor dentition  Mouth/Throat: Oropharynx is clear and moist. No oropharyngeal exudate.  Cardiovascular: Normal rate,  regular rhythm and normal heart sounds. Exam reveals no gallop and no friction rub.  No murmur heard.  Pulmonary/Chest: Effort normal and breath sounds normal. No respiratory distress. He has no wheezes.  Abdominal: Soft. Bowel sounds are normal. He exhibits no distension. There is no tenderness.  Lymphadenopathy:  He has no cervical adenopathy.     Lab Results  Recent Labs  06/19/13 0416 06/20/13 0559 06/21/13 0701  WBC 12.4*  --  13.0*  HGB 8.6*  --  9.3*  HCT 25.1*  --  27.7*  NA 136* 140 137  K 3.1* 4.1 4.7  CL 98 103 101  CO2 26 27 24   BUN 6 6 7   CREATININE 0.75 0.76 0.83   Liver Panel  Recent Labs  06/21/13 0701  PROT 5.9*  ALBUMIN 1.5*  AST 29  ALT 25  ALKPHOS 352*  BILITOT 0.7    Microbiology: 6/15 blood cx ngtd 6/16  Sputum cx pending Studies/Results: No results found.   Assessment/Plan: Pulmonary abscess = afebrile since changing to piptazo. Continue on current regimen of vancomycin and piptazo.Will likely need prolonged treatment of antibiotics. Recommend to treat for 2-3 wks with IV antibiotics then change to orals. We will see him back in the ID clinic on July 1st  [facility], [facility] Cell: [phone] Pager: [phone]  06/21/2013, 11:20 AM

## 2013-06-21 NOTE — Progress Notes (Signed)
NUTRITION FOLLOW UP  DOCUMENTATION CODES  Per approved criteria   -Severe malnutrition in the context of chronic illness   Pt meets criteria for severe MALNUTRITION in the context of chronic illness as evidenced by severe fat and muscle wasting and reported intake <75% for > 1 month.  Intervention:    Discontinue Resource Breeze TID   Continue to encourage PO intake  Add Snacks BID   RD to continue to follow   Nutrition Dx:   Inadequate oral intake related to chronic pain as evidenced by reported intake less than estimated needs and wt loss; ongoing  Goal:   Pt to meet >/= 90% of their estimated nutrition needs; unmet   Monitor:   Wt, po intake, acceptance of supplements, labs, I/O's  Assessment:   53 y.o. male who presents to the ED for eval of chronic pelvic pain, chest pain, and fever. CP comes and goes, is associated with cough, has been going on for at least a couple of months. Patient also reports weight loss over the past few months and night sweats as well as fever.  6/12:  - Pt reports that his appetite has been very poor for the past several months to a year.  - Pt weighed 140 lbs ~ 1 year ago, but has been slowly losing weight. He attributes this weight loss to chronic pain.  - Pt does not like "milky nutritional supplements."  6/19:  - Pt states that he doesn't have much of an appetite but he tries to eat anyway - Pt doesn't care for the Resource Breeze supplements --> will discontinue - Pt agreeable to receiving snacks BID  - Per doc flowsheet records pt is eating 50% of meals at this time    Height: Ht Readings from Last 1 Encounters:  06/14/13 5\' 7"  (1.702 m)    Weight Status:   Wt Readings from Last 1 Encounters:  06/21/13 137 lb 11.2 oz (62.46 kg)    Re-estimated needs:  Kcal: 1600-1800  Protein: 85-95 g  Fluid: 1.6-1.8 L/day  Skin: WNL   Diet Order: General   Intake/Output Summary (Last 24 hours) at 06/21/13 1349 Last data filed at  06/21/13 1310  Gross per 24 hour  Intake    660 ml  Output    680 ml  Net    -20 ml    Last BM: 6/18    Labs:   Recent Labs Lab 06/19/13 0416 06/20/13 0559 06/21/13 0701  NA 136* 140 137  K 3.1* 4.1 4.7  CL 98 103 101  CO2 26 27 24   BUN 6 6 7   CREATININE 0.75 0.76 0.83  CALCIUM 7.8* 8.0* 8.2*  GLUCOSE 102* 91 90    CBG (last 3)  No results found for this basename: GLUCAP,  in the last 72 hours  Scheduled Meds: . carbamazepine  200 mg Oral BID  . feeding supplement (RESOURCE BREEZE)  1 Container Oral TID BM  . FLUoxetine  20 mg Oral BID  . heparin  5,000 Units Subcutaneous 3 times per day  . ipratropium-albuterol  3 mL Nebulization TID  . mometasone-formoterol  2 puff Inhalation BID  . pantoprazole  40 mg Oral Daily  . piperacillin-tazobactam (ZOSYN)  IV  3.375 g Intravenous Q8H  . vancomycin  1,250 mg Intravenous Q12H    Continuous Infusions:   Eppie Gibsonebekah L Raynaldo Falco, BS Dietetic Intern Pager: [phone]

## 2013-06-21 NOTE — Progress Notes (Signed)
Patient for possible discharge home the weekend; [facility] RT; [facility], also Jeri ModenaPam Chandler [facility]; TCT Dr Drue SecondSnider ID, home IV antibiotics - IV Zosyn 3.375mg  q8hrs and IV Vancomycin 1,250mg  q12hrs x 2 wks- [facility] protocol for labs draws and pt will follow up with her in the office after discharge; [facility] [address] [facility] [place] and plans to follow up with him also; Noted possible home 02 - room air sats while ambulating is 93% and 98% sitting; patient does not qualify for home 02; Patient lives with his mother Brenda/ primary caregiver; Abelino DerrickB Chandler RN,BSN,MHA [phone]

## 2013-06-21 NOTE — Progress Notes (Signed)
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       1. Pneumonia with cavitary lung lesion, suspected abscess; still intermittent febrile; cont IV atx, vanc, Unasyn>zosyn on 6/15, vanc; blood cultures NGTD; prelim AFB neg; Sputum growing some candida, CT scan noted have discussed with pulmonary physician Dr. Chase Caller in detail, he recommends outpatient followup in the office with a total of 6 weeks of antibiotics, per ID 3 weeks of IV followed by oral antibiotics, will place PICC line. Will add chest PT with chest west, encourage patient to increase activity and sit up in the chair he has never gotten out of the bed. I doubt he will comply but he has been counseled. Will defer antibiotics to ID following. Clinically improving. Likely require home oxygen upon discharge. Have ordered home RN and respiratory therapy for pulmonary toiletry and IV antibiotics.     2. Sepsis/pneumonia, present on admission cont as above; Sepsis resolved.     3. COPD/emphysema; tobacco use cont bronchodilators prn; oxygen; Patient counseled to stop smoking.     4. ? H/O seizures; no  seizures for years per patient; cont home regimen      5. S/p PPM; stable; monitor      6. Elevated alk phos, bili;  liver US: unremarkable, s/op cholecystectomy;  repeat CMP improving; Continue outpatient monitoring and workup is needed.     7.Low K - replaced and monitor.        Code Status: full Family Communication:  D/w patient, his mother bedside.   Disposition Plan: home with PICC line, home RN and speech therapy.    Consultants:  ID, pulmonary Dr. Chase Caller over the phone who reviewed his CT  scan in detail    Procedures:  CT scan chest, PICC line ordered 06/21/2013   Anti-infectives   Start     Dose/Rate Route Frequency Ordered Stop   06/17/13 1800  piperacillin-tazobactam (ZOSYN) IVPB 3.375 g     3.375 g 12.5 mL/hr over 240 Minutes Intravenous Every 8 hours 06/17/13 1633     06/16/13 2247  vancomycin (VANCOCIN) 1,250 mg in sodium chloride 0.9 % 250 mL IVPB     1,250 mg 166.7 mL/hr over 90 Minutes Intravenous Every 12 hours 06/16/13 2247     06/16/13 2200  Ampicillin-Sulbactam (UNASYN) 3 g in sodium chloride 0.9 % 100 mL IVPB  Status:  Discontinued     3 g 100 mL/hr over 60 Minutes Intravenous 4 times per day 06/16/13 1725 06/17/13 1627   06/15/13 0100  cefTRIAXone (ROCEPHIN) 1 g in dextrose 5 % 50 mL IVPB  Status:  Discontinued     1 g 100 mL/hr over 30 Minutes Intravenous Every 24 hours 06/14/13 0059 06/14/13 0820   06/15/13 0100  azithromycin (ZITHROMAX) 500 mg in dextrose 5 % 250 mL IVPB  Status:  Discontinued     500 mg 250 mL/hr over 60 Minutes Intravenous Every 24 hours 06/14/13 0059 06/14/13 0820   06/14/13 1000  vancomycin (VANCOCIN) IVPB 750 mg/150 ml premix  Status:  Discontinued     750 mg 150 mL/hr over 60 Minutes Intravenous Every 12 hours 06/14/13 0846 06/16/13 2247   06/14/13 0900  Ampicillin-Sulbactam (UNASYN) 3 g in sodium chloride 0.9 %  100 mL IVPB  Status:  Discontinued     3 g 100 mL/hr over 60 Minutes Intravenous 4 times per day 06/14/13 0820 06/16/13 1725   06/14/13 0030  azithromycin (ZITHROMAX) 500 mg in dextrose 5 % 250 mL IVPB     500 mg 250 mL/hr over 60 Minutes Intravenous  Once 06/14/13 0016 06/14/13 0234   06/13/13 2015  cefTRIAXone (ROCEPHIN) 1 g in dextrose 5 % 50 mL IVPB     1 g 100 mL/hr over 30 Minutes Intravenous  Once 06/13/13 2000 06/13/13 2149        HPI/Subjective:  In bed, denies any headache, Mild post cough musculoskeletal chest pain , No abdominal pain, continues to have cough with mild shortness of breath,  improving gradually.  Objective: Filed Vitals:   06/21/13 0605  BP: 129/69  Pulse: 71  Temp: 99.1 F (37.3 C)  Resp: 20    Intake/Output Summary (Last 24 hours) at 06/21/13 0845 Last data filed at 06/21/13 0840  Gross per 24 hour  Intake    240 ml  Output    930 ml  Net   -690 ml   Filed Weights   06/14/13 0700 06/21/13 0605  Weight: 57.6 kg (126 lb 15.8 oz) 62.46 kg (137 lb 11.2 oz)    Exam:   General:  alert  Cardiovascular: s1,s2 rrr  Respiratory: few rales Bilateral lower Lobes  Abdomen: soft, nt,nd   Musculoskeletal: no LE edema   Data Reviewed: Basic Metabolic Panel:  Recent Labs Lab 06/15/13 0447 06/17/13 0637 06/19/13 0416 06/20/13 0559 06/21/13 0701  NA 138 138 136* 140 137  K 3.7 3.5* 3.1* 4.1 4.7  CL 97 99 98 103 101  CO2 _0 GLUCOSE 132* 90 102* 91 90  BUN _1 CREATININE 0.88 0.65 0.75 0.76 0.83  CALCIUM 8.4 8.1* 7.8* 8.0* 8.2*   Liver Function Tests:  Recent Labs Lab 06/17/13 0637 06/21/13 0701  AST 26 29  ALT 21 25  ALKPHOS 318* 352*  BILITOT 1.2 0.7  PROT 6.0 5.9*  ALBUMIN 1.7* 1.5*    Recent Labs Lab 06/17/13 0637  LIPASE 18   No results found for this basename: AMMONIA,  in the last 168 hours CBC:  Recent Labs Lab 06/15/13 0447 06/17/13 0637 06/19/13 0416 06/21/13 0701  WBC 13.2* 13.7* 12.4* 13.0*  NEUTROABS 10.3*  --   --   --   HGB 10.4* 10.3* 8.6* 9.3*  HCT 30.7* 30.2* 25.1* 27.7*  MCV 97.2 95.9 95.4 100.0  PLT 278 391 421* 462*   Cardiac Enzymes: No results found for this basename: CKTOTAL, CKMB, CKMBINDEX, TROPONINI,  in the last 168 hours BNP (last 3 results)  Recent Labs  06/13/13 2032  PROBNP 235.4*   CBG: No results found for this basename: GLUCAP,  in the last 168 hours  Recent Results (from the past 240 hour(s))  CULTURE, BLOOD (ROUTINE X 2)     Status: None   Collection Time    06/13/13  8:32 PM      Result Value Ref Range Status   Specimen Description BLOOD  HAND RIGHT   Final   Special Requests BOTTLES DRAWN AEROBIC ONLY 3CC   Final   Culture  Setup Time     Final   Value: 06/14/2013 04:35     Performed at [facility]   Culture     Final   Value: NO GROWTH 5 DAYS  Performed at [facility]   Report Status 06/20/2013 FINAL   Final  CULTURE, BLOOD (ROUTINE X 2)     Status: None   Collection Time    06/13/13  8:43 PM      Result Value Ref Range Status   Specimen Description BLOOD ARM LEFT   Final   Special Requests BOTTLES DRAWN AEROBIC AND ANAEROBIC 5CC   Final   Culture  Setup Time     Final   Value: 06/14/2013 04:36     Performed at [facility]   Culture     Final   Value: NO GROWTH 5 DAYS     Performed at [facility]   Report Status 06/20/2013 FINAL   Final  URINE CULTURE     Status: None   Collection Time    06/13/13  9:46 PM      Result Value Ref Range Status   Specimen Description URINE, CATHETERIZED   Final   Special Requests NONE   Final   Culture  Setup Time     Final   Value: 06/14/2013 00:38     Performed at [place]     Final   Value: NO GROWTH     Performed at [facility]   Culture     Final   Value: NO GROWTH     Performed at [facility]   Report Status 06/15/2013 FINAL   Final  CULTURE, BLOOD (ROUTINE X 2)     Status: None   Collection Time    06/14/13  1:15 AM      Result Value Ref Range Status   Specimen Description BLOOD RIGHT ARM   Final   Special Requests BOTTLES DRAWN AEROBIC AND ANAEROBIC 10CC   Final   Culture  Setup Time     Final   Value: 06/14/2013 08:42     Performed at [facility]   Culture     Final   Value: NO GROWTH 5 DAYS     Performed at [facility]   Report Status 06/20/2013 FINAL   Final  CULTURE, BLOOD (ROUTINE X 2)     Status: None   Collection Time    06/14/13  1:26 AM      Result Value Ref Range Status   Specimen Description BLOOD LEFT HAND   Final   Special Requests BOTTLES  DRAWN AEROBIC AND ANAEROBIC 10CC   Final   Culture  Setup Time     Final   Value: 06/14/2013 08:42     Performed at [facility]   Culture     Final   Value: NO GROWTH 5 DAYS     Performed at [facility]   Report Status 06/20/2013 FINAL   Final  MRSA PCR SCREENING     Status: None   Collection Time    06/14/13  6:29 AM      Result Value Ref Range Status   MRSA by PCR NEGATIVE  NEGATIVE Final   Comment:            The GeneXpert MRSA Assay (FDA     approved for NASAL specimens     only), is one component of a     comprehensive MRSA colonization     surveillance program. It is not     intended to diagnose MRSA     infection nor to guide or     monitor treatment for  MRSA infections.  AFB CULTURE WITH SMEAR     Status: None   Collection Time    06/14/13 11:50 AM      Result Value Ref Range Status   Specimen Description LUNG SPUTUM   Final   Special Requests NONE   Final   Acid Fast Smear     Final   Value: NO ACID FAST BACILLI SEEN     Performed at [facility]   Culture     Final   Value: CULTURE WILL BE EXAMINED FOR 6 WEEKS BEFORE ISSUING A FINAL REPORT     Performed at [facility]   Report Status PENDING   Incomplete  AFB CULTURE WITH SMEAR     Status: None   Collection Time    06/15/13  4:24 PM      Result Value Ref Range Status   Specimen Description LUNG   Final   Special Requests Normal   Final   Acid Fast Smear     Final   Value: NO ACID FAST BACILLI SEEN     Performed at [facility]   Culture     Final   Value: CULTURE WILL BE EXAMINED FOR 6 WEEKS BEFORE ISSUING A FINAL REPORT     Performed at [facility]   Report Status PENDING   Incomplete  AFB CULTURE WITH SMEAR     Status: None   Collection Time    06/16/13 10:08 AM      Result Value Ref Range Status   Specimen Description SPUTUM   Final   Special Requests Normal   Final   Acid Fast Smear     Final   Value: NO ACID FAST BACILLI SEEN     Performed  at [facility]   Culture     Final   Value: CULTURE WILL BE EXAMINED FOR 6 WEEKS BEFORE ISSUING A FINAL REPORT     Performed at [facility]   Report Status PENDING   Incomplete  CULTURE, BLOOD (ROUTINE X 2)     Status: None   Collection Time    06/17/13  5:25 PM      Result Value Ref Range Status   Specimen Description BLOOD RIGHT HAND   Final   Special Requests BOTTLES DRAWN AEROBIC ONLY 10CC   Final   Culture  Setup Time     Final   Value: 06/17/2013 22:50     Performed at [facility]   Culture     Final   Value:        BLOOD CULTURE RECEIVED NO GROWTH TO DATE CULTURE WILL BE HELD FOR 5 DAYS BEFORE ISSUING A FINAL NEGATIVE REPORT     Performed at [facility]   Report Status PENDING   Incomplete  CULTURE, BLOOD (ROUTINE X 2)     Status: None   Collection Time    06/17/13  5:30 PM      Result Value Ref Range Status   Specimen Description BLOOD LEFT HAND   Final   Special Requests BOTTLES DRAWN AEROBIC ONLY 3CC   Final   Culture  Setup Time     Final   Value: 06/17/2013 22:50     Performed at [facility]   Culture     Final   Value:        BLOOD CULTURE RECEIVED NO GROWTH TO DATE CULTURE WILL BE HELD FOR 5 DAYS BEFORE ISSUING A FINAL NEGATIVE REPORT  Performed at [facility]   Report Status PENDING   Incomplete  AFB CULTURE WITH SMEAR     Status: None   Collection Time    06/18/13  1:36 PM      Result Value Ref Range Status   Specimen Description LUNG   Final   Special Requests Normal   Final   Acid Fast Smear     Final   Value: NO ACID FAST BACILLI SEEN     Performed at [facility]   Culture     Final   Value: CULTURE WILL BE EXAMINED FOR 6 WEEKS BEFORE ISSUING A FINAL REPORT     Performed at [facility]   Report Status PENDING   Incomplete  CULTURE, EXPECTORATED SPUTUM-ASSESSMENT     Status: None   Collection Time    06/18/13  1:36 PM      Result Value Ref Range Status   Specimen Description  SPUTUM   Final   Special Requests Normal   Final   Sputum evaluation     Final   Value: THIS SPECIMEN IS ACCEPTABLE. RESPIRATORY CULTURE REPORT TO FOLLOW.   Report Status 06/18/2013 FINAL   Final  CULTURE, RESPIRATORY (NON-EXPECTORATED)     Status: None   Collection Time    06/18/13  1:36 PM      Result Value Ref Range Status   Specimen Description SPUTUM   Final   Special Requests NONE   Final   Gram Stain     Final   Value: ABUNDANT WBC PRESENT,BOTH PMN AND MONONUCLEAR     NO SQUAMOUS EPITHELIAL CELLS SEEN     RARE GRAM POSITIVE COCCI     IN PAIRS     Performed at [facility]   Culture     Final   Value: FEW CANDIDA ALBICANS     Performed at [facility]   Report Status 06/20/2013 FINAL   Final     Studies: No results found.  Scheduled Meds: . carbamazepine  200 mg Oral BID  . feeding supplement (RESOURCE BREEZE)  1 Container Oral TID BM  . FLUoxetine  20 mg Oral BID  . heparin  5,000 Units Subcutaneous 3 times per day  . ipratropium-albuterol  3 mL Nebulization TID  . mometasone-formoterol  2 puff Inhalation BID  . pantoprazole  40 mg Oral Daily  . piperacillin-tazobactam (ZOSYN)  IV  3.375 g Intravenous Q8H  . vancomycin  1,250 mg Intravenous Q12H   Continuous Infusions:    Principal Problem:   COPD exacerbation Active Problems:   Symptomatic bradycardia   Pacemaker - Medtronic 2005, generator change September 2014   CAP (community acquired pneumonia)   Protein-calorie malnutrition, severe    Time spent: >35 minutes     [facility] Pager [phone]. If 7PM-7AM, please contact night-coverage at [email or web address], password [facility]  06/21/2013, 8:45 AM  LOS: 8 days

## 2013-06-21 NOTE — Progress Notes (Signed)
Peripherally Inserted Central Catheter/Midline Placement  The IV Nurse has discussed with the patient and/or persons authorized to consent for the patient, the purpose of this procedure and the potential benefits and risks involved with this procedure.  The benefits include less needle sticks, lab draws from the catheter and patient may be discharged home with the catheter.  Risks include, but not limited to, infection, bleeding, blood clot (thrombus formation), and puncture of an artery; nerve damage and irregular heat beat.  Alternatives to this procedure were also discussed. By Stacie Glazeobin Joyce RN  PICC/Midline Placement Documentation  PICC / Midline Single Lumen 06/21/13 PICC Right 43 cm 0 cm (Active)  Indication for Insertion or Continuance of Line Home intravenous therapies (PICC only) 06/21/2013  4:34 PM  Exposed Catheter (cm) 0 cm 06/21/2013  4:34 PM  Line Status Flushed;Saline locked;Blood return noted 06/21/2013  4:34 PM  Dressing Change Due 06/28/13 06/21/2013  4:34 PM       Michael [name], Michael [name] 06/21/2013, 4:35 PM

## 2013-06-21 NOTE — Progress Notes (Signed)
Patient sat at 98% on room air sitting in the chair. He remained at 93-94% on room air during ambulation.   Sim BoastHavy, RN

## 2013-06-21 NOTE — Progress Notes (Signed)
Agree with dietetic intern assessment. Intervention appropriate. RD will continue to monitor. Ian Malkineanne Barnett RD, LDN Inpatient Clinical Dietitian Pager: [phone] After Hours Pager: [phone]

## 2013-06-22 NOTE — Progress Notes (Signed)
Vancomycin and zosyn per pharmacy  ID: Vanc D#9/zosyn D#6 for PNA w/ pulmonary abscess - Afebrile, WBC 13, SCr stable - per ID, will need 3 weeks IV abx then change to PO - strep pneumo + legionella negative - Gold TB test indeterminate  CTX x 1 dose 6/11 Azithro x 1 dose 6/12 Unasyn 6/12>>6/15 Vancomycin 6/12>> Zosyn 6/15>>  6/14 VT 6.6 (drawn correctly), incr to 1250 mg q12h 6/17 VT 15.2 - cont  6/16 Sputum - few Candida 6/15 BCx2 - ngtd  6/12 Blood Cx x2>>NEG 6/11 Urine Cx >>NEG 6/11 Blood Cx x2>> NEG  Goal vancomycin trough 15-20 mcg/mL  Renal: SCr 0.83 from 0.76 on 06/19; other lytes ok; UOP 0.3 ml/kg/hr  Plan: 1. Continue vanc 1250mg  IV Q12H 2. Continue zosyn 3.375gm IV Q8H (4 hr inf) 3. F/u renal fxn, C&S, clinical status and weekly trough (next will need to order on 06/24)

## 2013-06-22 NOTE — Progress Notes (Signed)
[facility] PROGRESS NOTE  Michael [name] XHB:[id number] DOB: 04-17-1960 DOA: 06/13/2013 PCP: Dustin Burrill NOT IN SYSTEM    Assessment/Plan:   53 y/o male with COPD/emphysema, seizures, tobacco use, chronic chest pain, s/p PPM due ot bradycardia presented with SOB, productive cough, fever, chills, weight loss found to pneumonia with cavitary lesion.       1. Pneumonia with cavitary lung lesion, suspected abscess; still intermittent febrile; cont IV atx, vanc, Unasyn>zosyn on 6/15, vanc; blood cultures NGTD; prelim AFB neg; Sputum growing some candida, CT scan noted have discussed with pulmonary physician Dr. Chase Caller in detail, he recommends outpatient followup in the office with a total of 6 weeks of antibiotics, per ID 3 weeks of IV followed by oral antibiotics, will place PICC line. Will add chest PT with chest west, encourage patient to increase activity and sit up in the chair he has never gotten out of the bed. I doubt he will comply but he has been counseled. Will defer antibiotics to ID following.    Clinically improving. Likely require home oxygen upon discharge. Have ordered home RN and respiratory therapy for pulmonary toiletry and IV antibiotics.     2. Sepsis/pneumonia, present on admission cont as above; Sepsis resolved.     3. COPD/emphysema; tobacco use cont bronchodilators prn; oxygen; Patient counseled to stop smoking.     4. ? H/O seizures; no  seizures for years per patient; cont home regimen      5. S/p PPM; stable; monitor      6. Elevated alk phos, bili;  liver US: unremarkable, s/op cholecystectomy;  repeat CMP improving; Continue outpatient monitoring and workup is needed likely related to lung abscess and antibiotics.     7.Low K - replaced and monitor.        Code Status: full Family Communication:  D/w patient, his mother bedside.   Disposition Plan: home with PICC line, home RN and speech therapy.    Consultants:  ID,  pulmonary Dr. Chase Caller over the phone who reviewed his CT scan in detail    Procedures:  CT scan chest, PICC line ordered 06/21/2013   Anti-infectives   Start     Dose/Rate Route Frequency Ordered Stop   06/17/13 1800  piperacillin-tazobactam (ZOSYN) IVPB 3.375 g     3.375 g 12.5 mL/hr over 240 Minutes Intravenous Every 8 hours 06/17/13 1633     06/16/13 2247  vancomycin (VANCOCIN) 1,250 mg in sodium chloride 0.9 % 250 mL IVPB     1,250 mg 166.7 mL/hr over 90 Minutes Intravenous Every 12 hours 06/16/13 2247     06/16/13 2200  Ampicillin-Sulbactam (UNASYN) 3 g in sodium chloride 0.9 % 100 mL IVPB  Status:  Discontinued     3 g 100 mL/hr over 60 Minutes Intravenous 4 times per day 06/16/13 1725 06/17/13 1627   06/15/13 0100  cefTRIAXone (ROCEPHIN) 1 g in dextrose 5 % 50 mL IVPB  Status:  Discontinued     1 g 100 mL/hr over 30 Minutes Intravenous Every 24 hours 06/14/13 0059 06/14/13 0820   06/15/13 0100  azithromycin (ZITHROMAX) 500 mg in dextrose 5 % 250 mL IVPB  Status:  Discontinued     500 mg 250 mL/hr over 60 Minutes Intravenous Every 24 hours 06/14/13 0059 06/14/13 0820   06/14/13 1000  vancomycin (VANCOCIN) IVPB 750 mg/150 ml premix  Status:  Discontinued     750 mg 150 mL/hr over 60 Minutes Intravenous Every 12 hours 06/14/13 0846 06/16/13 2247   06/14/13  0900  Ampicillin-Sulbactam (UNASYN) 3 g in sodium chloride 0.9 % 100 mL IVPB  Status:  Discontinued     3 g 100 mL/hr over 60 Minutes Intravenous 4 times per day 06/14/13 0820 06/16/13 1725   06/14/13 0030  azithromycin (ZITHROMAX) 500 mg in dextrose 5 % 250 mL IVPB     500 mg 250 mL/hr over 60 Minutes Intravenous  Once 06/14/13 0016 06/14/13 0234   06/13/13 2015  cefTRIAXone (ROCEPHIN) 1 g in dextrose 5 % 50 mL IVPB     1 g 100 mL/hr over 30 Minutes Intravenous  Once 06/13/13 2000 06/13/13 2149        HPI/Subjective:  In bed, denies any headache, Mild post cough musculoskeletal chest pain , No abdominal pain,  continues to have cough with mild shortness of breath, improving gradually.  Objective: Filed Vitals:   06/22/13 0605  BP: 147/72  Pulse: 68  Temp: 98.8 F (37.1 C)  Resp: 20    Intake/Output Summary (Last 24 hours) at 06/22/13 0922 Last data filed at 06/22/13 0700  Gross per 24 hour  Intake    420 ml  Output    800 ml  Net   -380 ml   Filed Weights   06/14/13 0700 06/21/13 0605  Weight: 57.6 kg (126 lb 15.8 oz) 62.46 kg (137 lb 11.2 oz)    Exam:   General:  alert  Cardiovascular: s1,s2 rrr  Respiratory: few rales Bilateral lower Lobes  Abdomen: soft, nt,nd   Musculoskeletal: no LE edema   Data Reviewed: Basic Metabolic Panel:  Recent Labs Lab 06/17/13 0637 06/19/13 0416 06/20/13 0559 06/21/13 0701  NA 138 136* 140 137  K 3.5* 3.1* 4.1 4.7  CL 99 98 103 101  CO2 _0 GLUCOSE 90 102* 91 90  BUN _1 CREATININE 0.65 0.75 0.76 0.83  CALCIUM 8.1* 7.8* 8.0* 8.2*   Liver Function Tests:  Recent Labs Lab 06/17/13 0637 06/21/13 0701  AST 26 29  ALT 21 25  ALKPHOS 318* 352*  BILITOT 1.2 0.7  PROT 6.0 5.9*  ALBUMIN 1.7* 1.5*    Recent Labs Lab 06/17/13 0637  LIPASE 18   No results found for this basename: AMMONIA,  in the last 168 hours CBC:  Recent Labs Lab 06/17/13 0637 06/19/13 0416 06/21/13 0701  WBC 13.7* 12.4* 13.0*  HGB 10.3* 8.6* 9.3*  HCT 30.2* 25.1* 27.7*  MCV 95.9 95.4 100.0  PLT 391 421* 462*   Cardiac Enzymes: No results found for this basename: CKTOTAL, CKMB, CKMBINDEX, TROPONINI,  in the last 168 hours BNP (last 3 results)  Recent Labs  06/13/13 2032  PROBNP 235.4*   CBG: No results found for this basename: GLUCAP,  in the last 168 hours  Recent Results (from the past 240 hour(s))  CULTURE, BLOOD (ROUTINE X 2)     Status: None   Collection Time    06/13/13  8:32 PM      Result Value Ref Range Status   Specimen Description BLOOD HAND RIGHT   Final   Special Requests BOTTLES DRAWN AEROBIC ONLY  3CC   Final   Culture  Setup Time     Final   Value: 06/14/2013 04:35     Performed at [facility]   Culture     Final   Value: NO GROWTH 5 DAYS     Performed at [facility]   Report Status 06/20/2013 FINAL   Final  CULTURE,  BLOOD (ROUTINE X 2)     Status: None   Collection Time    06/13/13  8:43 PM      Result Value Ref Range Status   Specimen Description BLOOD ARM LEFT   Final   Special Requests BOTTLES DRAWN AEROBIC AND ANAEROBIC 5CC   Final   Culture  Setup Time     Final   Value: 06/14/2013 04:36     Performed at [facility]   Culture     Final   Value: NO GROWTH 5 DAYS     Performed at [facility]   Report Status 06/20/2013 FINAL   Final  URINE CULTURE     Status: None   Collection Time    06/13/13  9:46 PM      Result Value Ref Range Status   Specimen Description URINE, CATHETERIZED   Final   Special Requests NONE   Final   Culture  Setup Time     Final   Value: 06/14/2013 00:38     Performed at [place]     Final   Value: NO GROWTH     Performed at [facility]   Culture     Final   Value: NO GROWTH     Performed at [facility]   Report Status 06/15/2013 FINAL   Final  CULTURE, BLOOD (ROUTINE X 2)     Status: None   Collection Time    06/14/13  1:15 AM      Result Value Ref Range Status   Specimen Description BLOOD RIGHT ARM   Final   Special Requests BOTTLES DRAWN AEROBIC AND ANAEROBIC 10CC   Final   Culture  Setup Time     Final   Value: 06/14/2013 08:42     Performed at [facility]   Culture     Final   Value: NO GROWTH 5 DAYS     Performed at [facility]   Report Status 06/20/2013 FINAL   Final  CULTURE, BLOOD (ROUTINE X 2)     Status: None   Collection Time    06/14/13  1:26 AM      Result Value Ref Range Status   Specimen Description BLOOD LEFT HAND   Final   Special Requests BOTTLES DRAWN AEROBIC AND ANAEROBIC 10CC   Final   Culture  Setup Time      Final   Value: 06/14/2013 08:42     Performed at [facility]   Culture     Final   Value: NO GROWTH 5 DAYS     Performed at [facility]   Report Status 06/20/2013 FINAL   Final  MRSA PCR SCREENING     Status: None   Collection Time    06/14/13  6:29 AM      Result Value Ref Range Status   MRSA by PCR NEGATIVE  NEGATIVE Final   Comment:            The GeneXpert MRSA Assay (FDA     approved for NASAL specimens     only), is one component of a     comprehensive MRSA colonization     surveillance program. It is not     intended to diagnose MRSA     infection nor to guide or     monitor treatment for     MRSA infections.  AFB CULTURE WITH SMEAR     Status:  None   Collection Time    06/14/13 11:50 AM      Result Value Ref Range Status   Specimen Description LUNG SPUTUM   Final   Special Requests NONE   Final   Acid Fast Smear     Final   Value: NO ACID FAST BACILLI SEEN     Performed at [facility]   Culture     Final   Value: CULTURE WILL BE EXAMINED FOR 6 WEEKS BEFORE ISSUING A FINAL REPORT     Performed at [facility]   Report Status PENDING   Incomplete  AFB CULTURE WITH SMEAR     Status: None   Collection Time    06/15/13  4:24 PM      Result Value Ref Range Status   Specimen Description LUNG   Final   Special Requests Normal   Final   Acid Fast Smear     Final   Value: NO ACID FAST BACILLI SEEN     Performed at [facility]   Culture     Final   Value: CULTURE WILL BE EXAMINED FOR 6 WEEKS BEFORE ISSUING A FINAL REPORT     Performed at [facility]   Report Status PENDING   Incomplete  AFB CULTURE WITH SMEAR     Status: None   Collection Time    06/16/13 10:08 AM      Result Value Ref Range Status   Specimen Description SPUTUM   Final   Special Requests Normal   Final   Acid Fast Smear     Final   Value: NO ACID FAST BACILLI SEEN     Performed at [facility]   Culture     Final   Value: CULTURE WILL  BE EXAMINED FOR 6 WEEKS BEFORE ISSUING A FINAL REPORT     Performed at [facility]   Report Status PENDING   Incomplete  CULTURE, BLOOD (ROUTINE X 2)     Status: None   Collection Time    06/17/13  5:25 PM      Result Value Ref Range Status   Specimen Description BLOOD RIGHT HAND   Final   Special Requests BOTTLES DRAWN AEROBIC ONLY 10CC   Final   Culture  Setup Time     Final   Value: 06/17/2013 22:50     Performed at [facility]   Culture     Final   Value:        BLOOD CULTURE RECEIVED NO GROWTH TO DATE CULTURE WILL BE HELD FOR 5 DAYS BEFORE ISSUING A FINAL NEGATIVE REPORT     Performed at [facility]   Report Status PENDING   Incomplete  CULTURE, BLOOD (ROUTINE X 2)     Status: None   Collection Time    06/17/13  5:30 PM      Result Value Ref Range Status   Specimen Description BLOOD LEFT HAND   Final   Special Requests BOTTLES DRAWN AEROBIC ONLY 3CC   Final   Culture  Setup Time     Final   Value: 06/17/2013 22:50     Performed at [facility]   Culture     Final   Value:        BLOOD CULTURE RECEIVED NO GROWTH TO DATE CULTURE WILL BE HELD FOR 5 DAYS BEFORE ISSUING A FINAL NEGATIVE REPORT     Performed at [facility]   Report  Status PENDING   Incomplete  AFB CULTURE WITH SMEAR     Status: None   Collection Time    06/18/13  1:36 PM      Result Value Ref Range Status   Specimen Description LUNG   Final   Special Requests Normal   Final   Acid Fast Smear     Final   Value: NO ACID FAST BACILLI SEEN     Performed at [facility]   Culture     Final   Value: CULTURE WILL BE EXAMINED FOR 6 WEEKS BEFORE ISSUING A FINAL REPORT     Performed at [facility]   Report Status PENDING   Incomplete  CULTURE, EXPECTORATED SPUTUM-ASSESSMENT     Status: None   Collection Time    06/18/13  1:36 PM      Result Value Ref Range Status   Specimen Description SPUTUM   Final   Special Requests Normal   Final   Sputum evaluation      Final   Value: THIS SPECIMEN IS ACCEPTABLE. RESPIRATORY CULTURE REPORT TO FOLLOW.   Report Status 06/18/2013 FINAL   Final  CULTURE, RESPIRATORY (NON-EXPECTORATED)     Status: None   Collection Time    06/18/13  1:36 PM      Result Value Ref Range Status   Specimen Description SPUTUM   Final   Special Requests NONE   Final   Gram Stain     Final   Value: ABUNDANT WBC PRESENT,BOTH PMN AND MONONUCLEAR     NO SQUAMOUS EPITHELIAL CELLS SEEN     RARE GRAM POSITIVE COCCI     IN PAIRS     Performed at [facility]   Culture     Final   Value: FEW CANDIDA ALBICANS     Performed at [facility]   Report Status 06/20/2013 FINAL   Final     Studies: No results found.  Scheduled Meds: . carbamazepine  200 mg Oral BID  . FLUoxetine  20 mg Oral BID  . heparin  5,000 Units Subcutaneous 3 times per day  . ipratropium-albuterol  3 mL Nebulization TID  . mometasone-formoterol  2 puff Inhalation BID  . pantoprazole  40 mg Oral Daily  . piperacillin-tazobactam (ZOSYN)  IV  3.375 g Intravenous Q8H  . sodium chloride  10-40 mL Intracatheter Q12H  . vancomycin  1,250 mg Intravenous Q12H   Continuous Infusions:    Principal Problem:   COPD exacerbation Active Problems:   Symptomatic bradycardia   Pacemaker - Medtronic 2005, generator change September 2014   CAP (community acquired pneumonia)   Protein-calorie malnutrition, severe    Time spent: >35 minutes     [facility] Pager [phone]. If 7PM-7AM, please contact night-coverage at [email or web address], password [facility]  06/22/2013, 9:22 AM  LOS: 9 days

## 2013-06-23 LAB — CULTURE, BLOOD (ROUTINE X 2)
CULTURE: NO GROWTH
Culture: NO GROWTH

## 2013-06-23 MED ORDER — METRONIDAZOLE IVPB CUSTOM: INTRAVENOUS | Status: DC

## 2013-06-23 MED ORDER — VANCOMYCIN HCL 10 G IV SOLR: 1250.0000 mg | Freq: Two times a day (BID) | INTRAVENOUS | Status: DC

## 2013-06-23 MED ORDER — BOOST PLUS PO LIQD
237.0000 mL | Freq: Three times a day (TID) | ORAL | Status: DC
Start: 2013-06-23 — End: 2013-07-08

## 2013-06-23 MED ORDER — DEXTROSE 5 % IV SOLN: 1.0000 g | Freq: Two times a day (BID) | INTRAVENOUS | Status: DC

## 2013-06-23 MED ORDER — IPRATROPIUM-ALBUTEROL 0.5-2.5 (3) MG/3ML IN SOLN: 3.0000 mL | RESPIRATORY_TRACT | Status: DC | PRN

## 2013-06-23 NOTE — Progress Notes (Signed)
IV ABX Prescriptions have been received. This [facility] RN [facility] re: patient being discharged tomorrow due to IV ABX scheduling. [facility] RN will not be able to see patient at 2am.  Outcome: Primary RN paged MD to confirm that patient will d/c tomorrow 06/24/13  Michael CaldwellWinnie Troxler RN Transition to Home Specialist II

## 2013-06-23 NOTE — Discharge Instructions (Signed)
Follow with Primary MD in 7 days  ° °Get CBC, CMP, 2 view Chest X ray checked  by Primary MD next visit.  ° ° °Activity: As tolerated with Full fall precautions use walker/cane & assistance as needed ° ° °Disposition Home  ° ° °Diet: Heart Healthy . ° °For Heart failure patients - Check your Weight same time everyday, if you gain over 2 pounds, or you develop in leg swelling, experience more shortness of breath or chest pain, call your Primary MD immediately. Follow Cardiac Low Salt Diet and 1.8 lit/day fluid restriction. ° ° °On your next visit with her primary care physician please Get Medicines reviewed and adjusted. ° °Please request your Prim.MD to go over all Hospital Tests and Procedure/Radiological results at the follow up, please get all Hospital records sent to your Prim MD by signing hospital release before you go home. ° ° °If you experience worsening of your admission symptoms, develop shortness of breath, life threatening emergency, suicidal or homicidal thoughts you must seek medical attention immediately by calling 911 or calling your MD immediately  if symptoms less severe. ° °You Must read complete instructions/literature along with all the possible adverse reactions/side effects for all the Medicines you take and that have been prescribed to you. Take any new Medicines after you have completely understood and accpet all the possible adverse reactions/side effects.  ° °Do not drive and provide baby sitting services if your were admitted for syncope or siezures until you have seen by Primary MD or a Neurologist and advised to do so again. ° °Do not drive when taking Pain medications.  ° ° °Do not take more than prescribed Pain, Sleep and Anxiety Medications ° °Special Instructions: If you have smoked or chewed Tobacco  in the last 2 yrs please stop smoking, stop any regular Alcohol  and or any Recreational drug use. ° °Wear Seat belts while driving. ° ° °Please note ° °You were cared for by a  hospitalist during your hospital stay. If you have any questions about your discharge medications or the care you received while you were in the hospital after you are discharged, you can call the unit and asked to speak with the hospitalist on call if the hospitalist that took care of you is not available. Once you are discharged, your primary care physician will handle any further medical issues. Please note that NO REFILLS for any discharge medications will be authorized once you are discharged, as it is imperative that you return to your primary care physician (or establish a relationship with a primary care physician if you do not have one) for your aftercare needs so that they can reassess your need for medications and monitor your lab values. ° °

## 2013-06-23 NOTE — Progress Notes (Signed)
[facility] PROGRESS NOTE  Michael [name] HBZ:[id number] DOB: 1960/02/10 DOA: 06/13/2013 PCP: PROVIDER NOT IN SYSTEM    Assessment/Plan:   53 y/o male with COPD/emphysema, seizures, tobacco use, chronic chest pain, s/p PPM due ot bradycardia presented with SOB, productive cough, fever, chills, weight loss found to pneumonia with cavitary lesion.       1. Pneumonia with cavitary lung lesion, suspected abscess; still intermittent febrile; cont IV atx, vanc, Unasyn>zosyn on 6/15, vanc; blood cultures NGTD; prelim AFB neg; Sputum growing some candida, CT scan noted have discussed with pulmonary physician Dr. Chase Caller in detail, he recommends outpatient followup in the office with a total of 6 weeks of antibiotics, per ID 3 weeks of IV followed by oral antibiotics, will place PICC line. Will add chest PT with chest west, encourage patient to increase activity and sit up in the chair he has never gotten out of the bed. I doubt he will comply but he has been counseled. Will defer antibiotics to ID following.    Clinically improving. Likely require home oxygen upon discharge. Have ordered home RN and respiratory therapy for pulmonary toiletry and IV antibiotics.   Just in case  there is problems on approving IV Zosyn which I have been told by the staff, have discussed with Dr. Graylon Good ID alternative regimen will be vancomycin-cefepime and Flagyl.     2. Sepsis/pneumonia, present on admission cont as above; Sepsis resolved.     3. COPD/emphysema; tobacco use cont bronchodilators prn; oxygen; Patient counseled to stop smoking.     4. ? H/O seizures; no  seizures for years per patient; cont home regimen      5. S/p PPM; stable; monitor      6. Elevated alk phos, bili;  liver US: unremarkable, s/op cholecystectomy;  repeat CMP improving; Continue outpatient monitoring and workup is needed likely related to lung abscess and antibiotics.     7.Low K - replaced and  stable.        Code Status: full Family Communication:  D/w patient, his mother bedside.   Disposition Plan: home with PICC line, home RN and speech therapy.    Consultants:  ID, pulmonary Dr. Chase Caller over the phone who reviewed his CT scan in detail    Procedures:  CT scan chest, PICC line ordered 06/21/2013   Anti-infectives   Start     Dose/Rate Route Frequency Ordered Stop   06/17/13 1800  piperacillin-tazobactam (ZOSYN) IVPB 3.375 g     3.375 g 12.5 mL/hr over 240 Minutes Intravenous Every 8 hours 06/17/13 1633     06/16/13 2247  vancomycin (VANCOCIN) 1,250 mg in sodium chloride 0.9 % 250 mL IVPB     1,250 mg 166.7 mL/hr over 90 Minutes Intravenous Every 12 hours 06/16/13 2247     06/16/13 2200  Ampicillin-Sulbactam (UNASYN) 3 g in sodium chloride 0.9 % 100 mL IVPB  Status:  Discontinued     3 g 100 mL/hr over 60 Minutes Intravenous 4 times per day 06/16/13 1725 06/17/13 1627   06/15/13 0100  cefTRIAXone (ROCEPHIN) 1 g in dextrose 5 % 50 mL IVPB  Status:  Discontinued     1 g 100 mL/hr over 30 Minutes Intravenous Every 24 hours 06/14/13 0059 06/14/13 0820   06/15/13 0100  azithromycin (ZITHROMAX) 500 mg in dextrose 5 % 250 mL IVPB  Status:  Discontinued     500 mg 250 mL/hr over 60 Minutes Intravenous Every 24 hours 06/14/13 0059 06/14/13 0820   06/14/13 1000  vancomycin (VANCOCIN) IVPB 750 mg/150 ml premix  Status:  Discontinued     750 mg 150 mL/hr over 60 Minutes Intravenous Every 12 hours 06/14/13 0846 06/16/13 2247   06/14/13 0900  Ampicillin-Sulbactam (UNASYN) 3 g in sodium chloride 0.9 % 100 mL IVPB  Status:  Discontinued     3 g 100 mL/hr over 60 Minutes Intravenous 4 times per day 06/14/13 0820 06/16/13 1725   06/14/13 0030  azithromycin (ZITHROMAX) 500 mg in dextrose 5 % 250 mL IVPB     500 mg 250 mL/hr over 60 Minutes Intravenous  Once 06/14/13 0016 06/14/13 0234   06/13/13 2015  cefTRIAXone (ROCEPHIN) 1 g in dextrose 5 % 50 mL IVPB     1 g 100  mL/hr over 30 Minutes Intravenous  Once 06/13/13 2000 06/13/13 2149        HPI/Subjective:  In bed, denies any headache, Mild post cough musculoskeletal chest pain , No abdominal pain, continues to have cough with mild shortness of breath, improving gradually.  Objective: Filed Vitals:   06/23/13 0510  BP: 130/77  Pulse: 63  Temp: 97.7 F (36.5 C)  Resp: 18    Intake/Output Summary (Last 24 hours) at 06/23/13 0912 Last data filed at 06/22/13 1740  Gross per 24 hour  Intake    960 ml  Output      0 ml  Net    960 ml   Filed Weights   06/14/13 0700 06/21/13 0605  Weight: 57.6 kg (126 lb 15.8 oz) 62.46 kg (137 lb 11.2 oz)    Exam:   General:  alert  Cardiovascular: s1,s2 rrr  Respiratory: few rales Bilateral lower Lobes  Abdomen: soft, nt,nd   Musculoskeletal: no LE edema   Data Reviewed: Basic Metabolic Panel:  Recent Labs Lab 06/17/13 0637 06/19/13 0416 06/20/13 0559 06/21/13 0701  NA 138 136* 140 137  K 3.5* 3.1* 4.1 4.7  CL 99 98 103 101  CO2 _0 GLUCOSE 90 102* 91 90  BUN _1 CREATININE 0.65 0.75 0.76 0.83  CALCIUM 8.1* 7.8* 8.0* 8.2*   Liver Function Tests:  Recent Labs Lab 06/17/13 0637 06/21/13 0701  AST 26 29  ALT 21 25  ALKPHOS 318* 352*  BILITOT 1.2 0.7  PROT 6.0 5.9*  ALBUMIN 1.7* 1.5*    Recent Labs Lab 06/17/13 0637  LIPASE 18   No results found for this basename: AMMONIA,  in the last 168 hours CBC:  Recent Labs Lab 06/17/13 0637 06/19/13 0416 06/21/13 0701  WBC 13.7* 12.4* 13.0*  HGB 10.3* 8.6* 9.3*  HCT 30.2* 25.1* 27.7*  MCV 95.9 95.4 100.0  PLT 391 421* 462*   Cardiac Enzymes: No results found for this basename: CKTOTAL, CKMB, CKMBINDEX, TROPONINI,  in the last 168 hours BNP (last 3 results)  Recent Labs  06/13/13 2032  PROBNP 235.4*   CBG: No results found for this basename: GLUCAP,  in the last 168 hours  Recent Results (from the past 240 hour(s))  CULTURE, BLOOD (ROUTINE X 2)      Status: None   Collection Time    06/13/13  8:32 PM      Result Value Ref Range Status   Specimen Description BLOOD HAND RIGHT   Final   Special Requests BOTTLES DRAWN AEROBIC ONLY 3CC   Final   Culture  Setup Time     Final   Value: 06/14/2013 04:35     Performed at [facility]   Culture     Final   Value: NO GROWTH 5 DAYS     Performed at [facility]   Report Status 06/20/2013 FINAL   Final  CULTURE, BLOOD (ROUTINE X 2)     Status: None   Collection Time    06/13/13  8:43 PM      Result Value Ref Range Status   Specimen Description BLOOD ARM LEFT   Final   Special Requests BOTTLES DRAWN AEROBIC AND ANAEROBIC 5CC   Final   Culture  Setup Time     Final   Value: 06/14/2013 04:36     Performed at [facility]   Culture     Final   Value: NO GROWTH 5 DAYS     Performed at [facility]   Report Status 06/20/2013 FINAL   Final  URINE CULTURE     Status: None   Collection Time    06/13/13  9:46 PM      Result Value Ref Range Status   Specimen Description URINE, CATHETERIZED   Final   Special Requests NONE   Final   Culture  Setup Time     Final   Value: 06/14/2013 00:38     Performed at [place]     Final   Value: NO GROWTH     Performed at [facility]   Culture     Final   Value: NO GROWTH     Performed at [facility]   Report Status 06/15/2013 FINAL   Final  CULTURE, BLOOD (ROUTINE X 2)     Status: None   Collection Time    06/14/13  1:15 AM      Result Value Ref Range Status   Specimen Description BLOOD RIGHT ARM   Final   Special Requests BOTTLES DRAWN AEROBIC AND ANAEROBIC 10CC   Final   Culture  Setup Time     Final   Value: 06/14/2013 08:42     Performed at [facility]   Culture     Final   Value: NO GROWTH 5 DAYS     Performed at [facility]   Report Status 06/20/2013 FINAL   Final  CULTURE, BLOOD (ROUTINE X 2)     Status: None   Collection Time    06/14/13   1:26 AM      Result Value Ref Range Status   Specimen Description BLOOD LEFT HAND   Final   Special Requests BOTTLES DRAWN AEROBIC AND ANAEROBIC 10CC   Final   Culture  Setup Time     Final   Value: 06/14/2013 08:42     Performed at [facility]   Culture     Final   Value: NO GROWTH 5 DAYS     Performed at [facility]   Report Status 06/20/2013 FINAL   Final  MRSA PCR SCREENING     Status: None   Collection Time    06/14/13  6:29 AM      Result Value Ref Range Status   MRSA by PCR NEGATIVE  NEGATIVE Final   Comment:            The GeneXpert MRSA Assay (FDA     approved for NASAL specimens     only), is one component of a     comprehensive MRSA colonization     surveillance program. It is not  intended to diagnose MRSA     infection nor to guide or     monitor treatment for     MRSA infections.  AFB CULTURE WITH SMEAR     Status: None   Collection Time    06/14/13 11:50 AM      Result Value Ref Range Status   Specimen Description LUNG SPUTUM   Final   Special Requests NONE   Final   Acid Fast Smear     Final   Value: NO ACID FAST BACILLI SEEN     Performed at [facility]   Culture     Final   Value: CULTURE WILL BE EXAMINED FOR 6 WEEKS BEFORE ISSUING A FINAL REPORT     Performed at [facility]   Report Status PENDING   Incomplete  AFB CULTURE WITH SMEAR     Status: None   Collection Time    06/15/13  4:24 PM      Result Value Ref Range Status   Specimen Description LUNG   Final   Special Requests Normal   Final   Acid Fast Smear     Final   Value: NO ACID FAST BACILLI SEEN     Performed at [facility]   Culture     Final   Value: CULTURE WILL BE EXAMINED FOR 6 WEEKS BEFORE ISSUING A FINAL REPORT     Performed at [facility]   Report Status PENDING   Incomplete  AFB CULTURE WITH SMEAR     Status: None   Collection Time    06/16/13 10:08 AM      Result Value Ref Range Status   Specimen Description SPUTUM    Final   Special Requests Normal   Final   Acid Fast Smear     Final   Value: NO ACID FAST BACILLI SEEN     Performed at [facility]   Culture     Final   Value: CULTURE WILL BE EXAMINED FOR 6 WEEKS BEFORE ISSUING A FINAL REPORT     Performed at [facility]   Report Status PENDING   Incomplete  CULTURE, BLOOD (ROUTINE X 2)     Status: None   Collection Time    06/17/13  5:25 PM      Result Value Ref Range Status   Specimen Description BLOOD RIGHT HAND   Final   Special Requests BOTTLES DRAWN AEROBIC ONLY 10CC   Final   Culture  Setup Time     Final   Value: 06/17/2013 22:50     Performed at [facility]   Culture     Final   Value:        BLOOD CULTURE RECEIVED NO GROWTH TO DATE CULTURE WILL BE HELD FOR 5 DAYS BEFORE ISSUING A FINAL NEGATIVE REPORT     Performed at [facility]   Report Status PENDING   Incomplete  CULTURE, BLOOD (ROUTINE X 2)     Status: None   Collection Time    06/17/13  5:30 PM      Result Value Ref Range Status   Specimen Description BLOOD LEFT HAND   Final   Special Requests BOTTLES DRAWN AEROBIC ONLY 3CC   Final   Culture  Setup Time     Final   Value: 06/17/2013 22:50     Performed at [facility]   Culture     Final   Value:  BLOOD CULTURE RECEIVED NO GROWTH TO DATE CULTURE WILL BE HELD FOR 5 DAYS BEFORE ISSUING A FINAL NEGATIVE REPORT     Performed at [facility]   Report Status PENDING   Incomplete  AFB CULTURE WITH SMEAR     Status: None   Collection Time    06/18/13  1:36 PM      Result Value Ref Range Status   Specimen Description LUNG   Final   Special Requests Normal   Final   Acid Fast Smear     Final   Value: NO ACID FAST BACILLI SEEN     Performed at [facility]   Culture     Final   Value: CULTURE WILL BE EXAMINED FOR 6 WEEKS BEFORE ISSUING A FINAL REPORT     Performed at [facility]   Report Status PENDING   Incomplete  CULTURE, EXPECTORATED SPUTUM-ASSESSMENT      Status: None   Collection Time    06/18/13  1:36 PM      Result Value Ref Range Status   Specimen Description SPUTUM   Final   Special Requests Normal   Final   Sputum evaluation     Final   Value: THIS SPECIMEN IS ACCEPTABLE. RESPIRATORY CULTURE REPORT TO FOLLOW.   Report Status 06/18/2013 FINAL   Final  CULTURE, RESPIRATORY (NON-EXPECTORATED)     Status: None   Collection Time    06/18/13  1:36 PM      Result Value Ref Range Status   Specimen Description SPUTUM   Final   Special Requests NONE   Final   Gram Stain     Final   Value: ABUNDANT WBC PRESENT,BOTH PMN AND MONONUCLEAR     NO SQUAMOUS EPITHELIAL CELLS SEEN     RARE GRAM POSITIVE COCCI     IN PAIRS     Performed at [facility]   Culture     Final   Value: FEW CANDIDA ALBICANS     Performed at [facility]   Report Status 06/20/2013 FINAL   Final     Studies: No results found.  Scheduled Meds: . carbamazepine  200 mg Oral BID  . FLUoxetine  20 mg Oral BID  . heparin  5,000 Units Subcutaneous 3 times per day  . ipratropium-albuterol  3 mL Nebulization TID  . mometasone-formoterol  2 puff Inhalation BID  . pantoprazole  40 mg Oral Daily  . piperacillin-tazobactam (ZOSYN)  IV  3.375 g Intravenous Q8H  . sodium chloride  10-40 mL Intracatheter Q12H  . vancomycin  1,250 mg Intravenous Q12H   Continuous Infusions:    Principal Problem:   COPD exacerbation Active Problems:   Symptomatic bradycardia   Pacemaker - Medtronic 2005, generator change September 2014   CAP (community acquired pneumonia)   Protein-calorie malnutrition, severe    Time spent: >35 minutes     [facility] Pager [phone]. If 7PM-7AM, please contact night-coverage at [email or web address], password [facility]  06/23/2013, 9:12 AM  LOS: 10 days

## 2013-06-23 NOTE — Discharge Summary (Addendum)
Michael [name], is a 53 y.o. male  DOB May 31, 1960  MRN [id number].  Admission date:  06/13/2013  Admitting Physician  Etta Quill, DO  Discharge Date:  06/23/2013   Primary MD  PROVIDER NOT IN SYSTEM  Recommendations for primary care physician for things to follow:   Check CBC CMP and a 2 view chest x-ray in a week   Admission Diagnosis  UTI (lower urinary tract infection) [599.0] Weight loss [783.21] CAP (community acquired pneumonia) [486] Hypotension [458.9]   Discharge Diagnosis  UTI (lower urinary tract infection) [599.0] Weight loss [783.21] CAP (community acquired pneumonia) [486] Hypotension [458.9]   Lung abscess  Principal Problem:   COPD exacerbation Active Problems:   Symptomatic bradycardia   Pacemaker - Medtronic 2005, generator change September 2014   CAP (community acquired pneumonia)   Protein-calorie malnutrition, severe      Past Medical History  Diagnosis Date  . 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 Bradycardia   . Tobacco abuse   . Chronic chest pain   . Seizure disorder   . History of nuclear stress test 06/14/2011    lexiscan; mild perfusion defect in basal inferosetpal & apical inferior region; negative for ischemia   . COPD (chronic obstructive pulmonary disease)   . Shortness of breath   . Pneumonia   . Anginal pain   . Pacemaker   . Depression     Past Surgical History  Procedure Laterality Date  . Pacemaker insertion  04/23/2003    Medtronic Kappa; [facility] - symptomatic bradycardia  . Cardiac catheterization  2002, 2003    normal coronaries  . Transthoracic echocardiogram  05/14/2012    EF 47-34%, normal systolic function; mild MR (ordered for mitral valve disease 424.0)  . Hand surgery  1998    x3  . Eye surgery  [age]  y.o.  . Gallbladder surgery  2003     Discharge Condition: stable   Follow UP  Follow-up Information   Follow up with [facility]    . Schedule an appointment as soon as possible for a visit in 1 week.   Contact information:   [address] [place] [address] [phone]      Follow up with Carlyle Basques, MD. Schedule an appointment as soon as possible for a visit in 1 week.   Specialty:  Infectious Diseases   Contact information:   [address] [place] [address] [phone]       Follow up with [facility], MD. Schedule an appointment as soon as possible for a visit in 1 week.   Specialty:  Pulmonary Disease   Contact information:   [place] [address] [phone]       Follow up with [facility]. (home health nurse for antibiotic administration)    Contact information:   [address] [place] [address] [phone]  Discharge Instructions  and  Discharge Medications        Discharge Instructions   Diet - low sodium heart healthy    Complete by:  As directed      Discharge instructions    Complete by:  As directed   Follow with Primary MD in 7 days   Get CBC, CMP, 2 view Chest X ray checked  by Primary MD next visit.    Activity: As tolerated with Full fall precautions use walker/cane & assistance as needed   Disposition Home     Diet: Heart Healthy    For Heart failure patients - Check your Weight same time everyday, if you gain over 2 pounds, or you develop in leg swelling, experience more shortness of breath or chest pain, call your Primary MD immediately. Follow Cardiac Low Salt Diet and 1.8 lit/day fluid restriction.   On your next visit with her primary care physician please Get Medicines reviewed and adjusted.  Please request your Prim.MD to go over all Hospital Tests and Procedure/Radiological results at the follow up,  please get all Hospital records sent to your Prim MD by signing hospital release before you go home.   If you experience worsening of your admission symptoms, develop shortness of breath, life threatening emergency, suicidal or homicidal thoughts you must seek medical attention immediately by calling 911 or calling your MD immediately  if symptoms less severe.  You Must read complete instructions/literature along with all the possible adverse reactions/side effects for all the Medicines you take and that have been prescribed to you. Take any new Medicines after you have completely understood and accpet all the possible adverse reactions/side effects.   Do not drive and provide baby sitting services if your were admitted for syncope or siezures until you have seen by Primary MD or a Neurologist and advised to do so again.  Do not drive when taking Pain medications.    Do not take more than prescribed Pain, Sleep and Anxiety Medications  Special Instructions: If you have smoked or chewed Tobacco  in the last 2 yrs please stop smoking, stop any regular Alcohol  and or any Recreational drug use.  Wear Seat belts while driving.   Please note  You were cared for by a hospitalist during your hospital stay. If you have any questions about your discharge medications or the care you received while you were in the hospital after you are discharged, you can call the unit and asked to speak with the hospitalist on call if the hospitalist that took care of you is not available. Once you are discharged, your primary care physician will handle any further medical issues. Please note that NO REFILLS for any discharge medications will be authorized once you are discharged, as it is imperative that you return to your primary care physician (or establish a relationship with a primary care physician if you do not have one) for your aftercare needs so that they can reassess your need for medications and monitor your  lab values.  Follow with Primary MD in 7 days   Get CBC, CMP, 2 view Chest X ray checked  by Primary MD next visit.    Activity: As tolerated with Full fall precautions use walker/cane & assistance as needed   Disposition Home     Diet: Heart Healthy    For Heart failure patients - Check your Weight same time everyday, if you gain over 2 pounds, or you develop in leg swelling, experience more  shortness of breath or chest pain, call your Primary MD immediately. Follow Cardiac Low Salt Diet and 1.8 lit/day fluid restriction.   On your next visit with her primary care physician please Get Medicines reviewed and adjusted.  Please request your Prim.MD to go over all Hospital Tests and Procedure/Radiological results at the follow up, please get all Hospital records sent to your Prim MD by signing hospital release before you go home.   If you experience worsening of your admission symptoms, develop shortness of breath, life threatening emergency, suicidal or homicidal thoughts you must seek medical attention immediately by calling 911 or calling your MD immediately  if symptoms less severe.  You Must read complete instructions/literature along with all the possible adverse reactions/side effects for all the Medicines you take and that have been prescribed to you. Take any new Medicines after you have completely understood and accpet all the possible adverse reactions/side effects.   Do not drive and provide baby sitting services if your were admitted for syncope or siezures until you have seen by Primary MD or a Neurologist and advised to do so again.  Do not drive when taking Pain medications.    Do not take more than prescribed Pain, Sleep and Anxiety Medications  Special Instructions: If you have smoked or chewed Tobacco  in the last 2 yrs please stop smoking, stop any regular Alcohol  and or any Recreational drug use.  Wear Seat belts while driving.   Please note  You were cared  for by a hospitalist during your hospital stay. If you have any questions about your discharge medications or the care you received while you were in the hospital after you are discharged, you can call the unit and asked to speak with the hospitalist on call if the hospitalist that took care of you is not available. Once you are discharged, your primary care physician will handle any further medical issues. Please note that NO REFILLS for any discharge medications will be authorized once you are discharged, as it is imperative that you return to your primary care physician (or establish a relationship with a primary care physician if you do not have one) for your aftercare needs so that they can reassess your need for medications and monitor your lab values.     Increase activity slowly    Complete by:  As directed             Medication List         albuterol 108 (90 BASE) MCG/ACT inhaler  Commonly known as:  PROVENTIL HFA;VENTOLIN HFA  Inhale 2 puffs into the lungs every 6 (six) hours as needed for wheezing.     carbamazepine 200 MG 12 hr capsule  Commonly known as:  CARBATROL  Take 200 mg by mouth 2 (two) times daily.     ceFEPIme 1 g in dextrose 5 % 50 mL  Inject 1 g into the vein every 12 (twelve) hours.     FLUoxetine 20 MG capsule  Commonly known as:  PROZAC  Take 20 mg by mouth 2 (two) times daily.     Fluticasone-Salmeterol 100-50 MCG/DOSE Aepb  Commonly known as:  ADVAIR  Inhale 1 puff into the lungs every 12 (twelve) hours.     ipratropium 17 MCG/ACT inhaler  Commonly known as:  ATROVENT HFA  Inhale 2 puffs into the lungs every 6 (six) hours.     ipratropium-albuterol 0.5-2.5 (3) MG/3ML Soln  Commonly known as:  DUONEB  Take 3  mLs by nebulization every 4 (four) hours as needed (SOB).     lactose free nutrition Liqd  Take 237 mLs by mouth 3 (three) times daily with meals.     metroNIDAZOLE 250 MG Soln IVPB  Commonly known as:  FLAGYL  500 mg IV 3 times a day, 2  week supply.     naproxen sodium 220 MG tablet  Commonly known as:  ANAPROX  Take 440 mg by mouth 3 (three) times daily as needed (pain).     omeprazole 20 MG capsule  Commonly known as:  PRILOSEC  Take 20 mg by mouth 2 (two) times daily.     vancomycin 1,250 mg in sodium chloride 0.9 % 250 mL  Inject 1,250 mg into the vein every 12 (twelve) hours.          Diet and Activity recommendation: See Discharge Instructions above   Consults obtained - ID, pulmonary physician Dr. Chase Caller over the phone who reviewed his CT scan in detail   Major procedures and Radiology Reports - PLEASE review detailed and final reports for all details, in brief -     Dg Chest 2 View  06/18/2013   CLINICAL DATA:  Short of breath.  Cough.  Re-evaluate pneumonia.  EXAM: CHEST  2 VIEW  COMPARISON:  Chest CT and chest radiograph, 06/13/2013  FINDINGS: Consolidation in the left upper lobe has mildly increased, extending more inferiorly. Lateral view demonstrates mild consolidation in the anterior left lower lobe.  More dense consolidation with associated bullae in the left upper lobe at the apex is similar.  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 No right lung consolidation.  Minimal left pleural effusion.  No pneumothorax.  Cardiac silhouette is normal in size. Left upper lobe consolidation silhouettes the aortic arch and superior left hilum. No convincing mediastinal or hilar mass.  Stable left anterior chest wall sequential pacemaker, well positioned.  IMPRESSION: 1. Left upper lobe pneumonia has increased with consolidation extending more inferiorly in the left upper lobe and a small amount of consolidation are noted in the left anterior lower lobe. 2. No other change.   Electronically Signed   By: Lajean Manes M.D.   On: 06/18/2013 08:48   Ct Chest Wo Contrast  06/18/2013   CLINICAL DATA:  Non resolving pneumonia  EXAM: CT CHEST WITHOUT CONTRAST  TECHNIQUE:  Multidetector CT imaging of the chest was performed following the standard protocol without IV contrast. Sagittal and coronal MPR images reconstructed from axial data set.  COMPARISON:  06/17/2013  FINDINGS: LEFT subclavian transvenous pacemaker leads project at RIGHT atrium and RIGHT ventricle.  Minimal pericardial effusion.  Upper normal size of ascending thoracic aorta 3.8 x 3.8 cm image 31.  Small BILATERAL pleural effusions.  Post cholecystectomy.  Remaining visualized portions of upper abdomen unremarkable.  Severe emphysematous changes with bullae at RIGHT apex.  Large cavitary foci LEFT upper lobe identified containing small air-fluid levels and wall thickening.  Extensive airspace infiltrate in LEFT upper lobe and lingula, slightly increased.  Mild infiltrate at RIGHT lower lobe and minimally in RIGHT middle lobe, new since previous exam.  No discrete pulmonary mass/nodule.  Osseous structures unremarkable.  IMPRESSION: Extensive LEFT upper lobe and lingular consolidation consistent with pneumonia, slightly increased since previous exam.  Scattered small air-fluid levels within bullae with wall thickening at LEFT upper lobe.  Less severe infiltrate in RIGHT lower lobe and minimally RIGHT middle lobe.  Small BILATERAL pleural effusions and minimal pericardial  effusion.   Electronically Signed   By: Lavonia Dana M.D.   On: 06/18/2013 17:01   Ct Chest W Contrast  06/14/2013   CLINICAL DATA:  Chest pain, lower abdominal pain,  EXAM: CT CHEST, ABDOMEN, AND PELVIS WITH CONTRAST  TECHNIQUE: Multidetector CT imaging of the chest, abdomen and pelvis was performed following the standard protocol during bolus administration of intravenous contrast.  CONTRAST:  133m OMNIPAQUE IOHEXOL 300 MG/ML  SOLN  COMPARISON:  Chest x-ray earlier today. CT abdomen and pelvis 07/08/2010  FINDINGS: CT CHEST FINDINGS  Normal cardiac size. Permanent transvenous pacer good position. Good opacification of the arch and great vessels as  well as central pulmonary arteries reveals no abnormality.  No suspicious pulmonary nodules. No hilar or mediastinal adenopathy.  There is severe emphysematous change with large bilateral apical air cysts. On the left there is a superimposed acute pneumonia with many of the cysts filled with fluid. Consolidation is seen more inferiorly and posteriorly the left upper lobe. Some infiltrate extends into the lingula. There is no effusion. There is no pneumothorax. Osseous structures unremarkable.  CT ABDOMEN AND PELVIS FINDINGS  BODY WALL: Negative.  LOWER CHEST: Unremarkable.  ABDOMEN/PELVIS:  Liver: No focal abnormality.  Biliary: No evidence of biliary obstruction or Keith[place]. Status post cholecystectomy.  Pancreas: Unremarkable.  Spleen: Unremarkable.  Adrenals: Unremarkable.  Kidneys and ureters: No hydronephrosis or Henrikson.  Bladder: Unremarkable.  Reproductive: Unremarkable.  Bowel: No obstruction. Normal appendix.  Retroperitoneum: No mass or adenopathy.  Peritoneum: No free fluid or gas.  Vascular: No acute abnormality.  Moderate calcification.  OSSEOUS: No acute abnormalities.  IMPRESSION: Severe COPD with acute left upper lobe and lingular pneumonia.  No acute intra-abdominal or pelvic findings.   Electronically Signed   By: JRolla FlattenM.D.   On: 06/14/2013 00:03   UKoreaAbdomen Complete  06/17/2013   CLINICAL DATA:  Elevated bilirubin.  EXAM: ULTRASOUND ABDOMEN COMPLETE  COMPARISON:  CT 07/08/2010.  FINDINGS: Gallbladder:  Cholecystectomy.  Common bile duct:  Diameter: 3.4 mm  Liver:  No focal lesion identified. Within normal limits in parenchymal echogenicity.  IVC:  No abnormality visualized.  Pancreas:  Visualized portion unremarkable.  Spleen:  Size and appearance within normal limits.  Right Kidney:  Length: Lab 0.3 cm. Echogenicity within normal limits. No mass or hydronephrosis visualized. Tiny amount of perinephric fluid noted.  Left Kidney:  Length: Lab 0.5 cm. Echogenicity within normal limits. No  mass or hydronephrosis visualized.  Abdominal aorta:  No aneurysm visualized.  Other findings:  Bilateral pleural effusions  IMPRESSION: 1. Cholecystectomy. No evidence of biliary distention or focal hepatic abnormality. 2. Bilateral pleural effusions. Small amount of right perinephric fluid also noted.   Electronically Signed   By: TMarcello Moores Register   On: 06/17/2013 09:23   Ct Abdomen Pelvis W Contrast  06/14/2013   CLINICAL DATA:  Chest pain, lower abdominal pain,  EXAM: CT CHEST, ABDOMEN, AND PELVIS WITH CONTRAST  TECHNIQUE: Multidetector CT imaging of the chest, abdomen and pelvis was performed following the standard protocol during bolus administration of intravenous contrast.  CONTRAST:  1052mOMNIPAQUE IOHEXOL 300 MG/ML  SOLN  COMPARISON:  Chest x-ray earlier today. CT abdomen and pelvis 07/08/2010  FINDINGS: CT CHEST FINDINGS  Normal cardiac size. Permanent transvenous pacer good position. Good opacification of the arch and great vessels as well as central pulmonary arteries reveals no abnormality.  No suspicious pulmonary nodules. No hilar or mediastinal adenopathy.  There is severe emphysematous change with large bilateral  apical air cysts. On the left there is a superimposed acute pneumonia with many of the cysts filled with fluid. Consolidation is seen more inferiorly and posteriorly the left upper lobe. Some infiltrate extends into the lingula. There is no effusion. There is no pneumothorax. Osseous structures unremarkable.  CT ABDOMEN AND PELVIS FINDINGS  BODY WALL: Negative.  LOWER CHEST: Unremarkable.  ABDOMEN/PELVIS:  Liver: No focal abnormality.  Biliary: No evidence of biliary obstruction or [place]. Status post cholecystectomy.  Pancreas: Unremarkable.  Spleen: Unremarkable.  Adrenals: Unremarkable.  Kidneys and ureters: No hydronephrosis or Villamor.  Bladder: Unremarkable.  Reproductive: Unremarkable.  Bowel: No obstruction. Normal appendix.  Retroperitoneum: No mass or adenopathy.  Peritoneum: No  free fluid or gas.  Vascular: No acute abnormality.  Moderate calcification.  OSSEOUS: No acute abnormalities.  IMPRESSION: Severe COPD with acute left upper lobe and lingular pneumonia.  No acute intra-abdominal or pelvic findings.   Electronically Signed   By: Rolla Flatten M.D.   On: 06/14/2013 00:03   Dg Chest Port 1 View  06/13/2013   CLINICAL DATA:  Chest pain and fever  EXAM: PORTABLE CHEST - 1 VIEW  COMPARISON:  09/06/2012  FINDINGS: Cardiac shadow is stable. A dual lead pacing device is again seen and stable. The lungs remain hyperinflated. Infiltrative changes noted in the left upper lobe. There is also some suggestion of cavitary component. Further evaluation by means of CT of the chest is recommended. The underlying bony structures are within normal limits.  IMPRESSION: Left upper lobe infiltrate with some suggestion of a cavitary component. CT is recommended for further evaluation.   Electronically Signed   By: Inez Catalina M.D.   On: 06/13/2013 20:32    Micro Results      Recent Results (from the past 240 hour(s))  CULTURE, BLOOD (ROUTINE X 2)     Status: None   Collection Time    06/13/13  8:32 PM      Result Value Ref Range Status   Specimen Description BLOOD HAND RIGHT   Final   Special Requests BOTTLES DRAWN AEROBIC ONLY 3CC   Final   Culture  Setup Time     Final   Value: 06/14/2013 04:35     Performed at [facility]   Culture     Final   Value: NO GROWTH 5 DAYS     Performed at [facility]   Report Status 06/20/2013 FINAL   Final  CULTURE, BLOOD (ROUTINE X 2)     Status: None   Collection Time    06/13/13  8:43 PM      Result Value Ref Range Status   Specimen Description BLOOD ARM LEFT   Final   Special Requests BOTTLES DRAWN AEROBIC AND ANAEROBIC 5CC   Final   Culture  Setup Time     Final   Value: 06/14/2013 04:36     Performed at [facility]   Culture     Final   Value: NO GROWTH 5 DAYS     Performed at [facility]   Report  Status 06/20/2013 FINAL   Final  URINE CULTURE     Status: None   Collection Time    06/13/13  9:46 PM      Result Value Ref Range Status   Specimen Description URINE, CATHETERIZED   Final   Special Requests NONE   Final   Culture  Setup Time     Final   Value: 06/14/2013 00:38  Performed at [facility]     Final   Value: NO GROWTH     Performed at [facility]   Culture     Final   Value: NO GROWTH     Performed at [facility]   Report Status 06/15/2013 FINAL   Final  CULTURE, BLOOD (ROUTINE X 2)     Status: None   Collection Time    06/14/13  1:15 AM      Result Value Ref Range Status   Specimen Description BLOOD RIGHT ARM   Final   Special Requests BOTTLES DRAWN AEROBIC AND ANAEROBIC 10CC   Final   Culture  Setup Time     Final   Value: 06/14/2013 08:42     Performed at [facility]   Culture     Final   Value: NO GROWTH 5 DAYS     Performed at [facility]   Report Status 06/20/2013 FINAL   Final  CULTURE, BLOOD (ROUTINE X 2)     Status: None   Collection Time    06/14/13  1:26 AM      Result Value Ref Range Status   Specimen Description BLOOD LEFT HAND   Final   Special Requests BOTTLES DRAWN AEROBIC AND ANAEROBIC 10CC   Final   Culture  Setup Time     Final   Value: 06/14/2013 08:42     Performed at [facility]   Culture     Final   Value: NO GROWTH 5 DAYS     Performed at [facility]   Report Status 06/20/2013 FINAL   Final  MRSA PCR SCREENING     Status: None   Collection Time    06/14/13  6:29 AM      Result Value Ref Range Status   MRSA by PCR NEGATIVE  NEGATIVE Final   Comment:            The GeneXpert MRSA Assay (FDA     approved for NASAL specimens     only), is one component of a     comprehensive MRSA colonization     surveillance program. It is not     intended to diagnose MRSA     infection nor to guide or     monitor treatment for     MRSA infections.  AFB CULTURE  WITH SMEAR     Status: None   Collection Time    06/14/13 11:50 AM      Result Value Ref Range Status   Specimen Description LUNG SPUTUM   Final   Special Requests NONE   Final   Acid Fast Smear     Final   Value: NO ACID FAST BACILLI SEEN     Performed at [facility]   Culture     Final   Value: CULTURE WILL BE EXAMINED FOR 6 WEEKS BEFORE ISSUING A FINAL REPORT     Performed at [facility]   Report Status PENDING   Incomplete  AFB CULTURE WITH SMEAR     Status: None   Collection Time    06/15/13  4:24 PM      Result Value Ref Range Status   Specimen Description LUNG   Final   Special Requests Normal   Final   Acid Fast Smear     Final   Value: NO ACID FAST BACILLI SEEN     Performed at [facility]   Culture     Final   Value: CULTURE WILL BE EXAMINED FOR 6 WEEKS BEFORE ISSUING A FINAL REPORT     Performed at [facility]   Report Status PENDING   Incomplete  AFB CULTURE WITH SMEAR     Status: None   Collection Time    06/16/13 10:08 AM      Result Value Ref Range Status   Specimen Description SPUTUM   Final   Special Requests Normal   Final   Acid Fast Smear     Final   Value: NO ACID FAST BACILLI SEEN     Performed at [facility]   Culture     Final   Value: CULTURE WILL BE EXAMINED FOR 6 WEEKS BEFORE ISSUING A FINAL REPORT     Performed at [facility]   Report Status PENDING   Incomplete  CULTURE, BLOOD (ROUTINE X 2)     Status: None   Collection Time    06/17/13  5:25 PM      Result Value Ref Range Status   Specimen Description BLOOD RIGHT HAND   Final   Special Requests BOTTLES DRAWN AEROBIC ONLY 10CC   Final   Culture  Setup Time     Final   Value: 06/17/2013 22:50     Performed at [facility]   Culture     Final   Value: NO GROWTH 5 DAYS     Performed at [facility]   Report Status 06/23/2013 FINAL   Final  CULTURE, BLOOD (ROUTINE X 2)     Status: None   Collection Time    06/17/13   5:30 PM      Result Value Ref Range Status   Specimen Description BLOOD LEFT HAND   Final   Special Requests BOTTLES DRAWN AEROBIC ONLY 3CC   Final   Culture  Setup Time     Final   Value: 06/17/2013 22:50     Performed at [facility]   Culture     Final   Value: NO GROWTH 5 DAYS     Performed at [facility]   Report Status 06/23/2013 FINAL   Final  AFB CULTURE WITH SMEAR     Status: None   Collection Time    06/18/13  1:36 PM      Result Value Ref Range Status   Specimen Description LUNG   Final   Special Requests Normal   Final   Acid Fast Smear     Final   Value: NO ACID FAST BACILLI SEEN     Performed at [facility]   Culture     Final   Value: CULTURE WILL BE EXAMINED FOR 6 WEEKS BEFORE ISSUING A FINAL REPORT     Performed at [facility]   Report Status PENDING   Incomplete  CULTURE, EXPECTORATED SPUTUM-ASSESSMENT     Status: None   Collection Time    06/18/13  1:36 PM      Result Value Ref Range Status   Specimen Description SPUTUM   Final   Special Requests Normal   Final   Sputum evaluation     Final   Value: THIS SPECIMEN IS ACCEPTABLE. RESPIRATORY CULTURE REPORT TO FOLLOW.   Report Status 06/18/2013 FINAL   Final  CULTURE, RESPIRATORY (NON-EXPECTORATED)     Status: None   Collection Time    06/18/13  1:36 PM  Result Value Ref Range Status   Specimen Description SPUTUM   Final   Special Requests NONE   Final   Gram Stain     Final   Value: ABUNDANT WBC PRESENT,BOTH PMN AND MONONUCLEAR     NO SQUAMOUS EPITHELIAL CELLS SEEN     RARE GRAM POSITIVE COCCI     IN PAIRS     Performed at [facility]   Culture     Final   Value: FEW CANDIDA ALBICANS     Performed at [facility]   Report Status 06/20/2013 FINAL   Final     History of present illness and  Hospital Course:     Kindly see H&P for history of present illness and admission details, please review complete Labs, Consult reports and Test reports for  all details in brief Michael [name], is a 53 y.o. male, patient with history of  COPD, ongoing smoking counseled to quit, chronic musculoskeletal chest pain, history of sick sinus syndrome status post pacemaker placement in April of 2005, was admitted to the hospital with diagnosis of community acquired pneumonia along with pulmonary abscess.     1. Pneumonia with cavitary lung lesion, suspected lung abscess; has improved considerably after empiric IV antibiotics under the guidance of ID, CT scan was reviewed in detail with pulmonary physician Dr. Chase Caller who recommended that patient be followed in his office, clinically he is now stable, afebrile, leukocytosis improving, has responded well to chest vibrator pulmonary toiletry treatments, currently on room air but does require oxygen upon ambulation, he has received a PICC line and will be discharged home on 2 more weeks of IV antibiotics with close follow up with ID and pulmonary in the outpatient setting. He has been counseled multiple times to quit smoking. Will get home oxygen if he qualifies, nebulizer treatments, home RN and respiratory therapy for IV antibiotics and pulmonary toiletry.    2. Sepsis/pneumonia, present on admission cont as above; Sepsis resolved.     3. COPD/emphysema; tobacco use cont bronchodilators, if qualifies home oxygen; Patient counseled to stop smoking.     4. ? H/O seizures; no seizures for years per patient; cont home regimen.     5. S/p PPM deu to sick sinus syndrome; stable; monitor     6. Elevated alk phos, bili; liver US: unremarkable, s/op cholecystectomy;  Continue outpatient monitoring and workup as needed likely related to lung abscess and antibiotics.      7.Low K - replaced and stable.      8. Clinical evidence of moderate to severe protein calorie malnutrition. Will be placed on protein supplementation.      Today   Subjective:   Michael [name] today has no headache,no chest abdominal  pain,no new weakness tingling or numbness, feels much better wants to go home today.    Objective:   Blood pressure 142/82, pulse 79, temperature 98.1 F (36.7 C), temperature source Oral, resp. rate 20, height '5\' 7"'  (1.702 m), weight 62.46 kg (137 lb 11.2 oz), SpO2 96.00%.   Intake/Output Summary (Last 24 hours) at 06/23/13 1238 Last data filed at 06/22/13 1740  Gross per 24 hour  Intake    960 ml  Output      0 ml  Net    960 ml    Exam Awake Alert, Oriented x 3, No new F.N deficits, Normal affect [place].AT,PERRAL Supple Neck,No JVD, No cervical lymphadenopathy appriciated.  Symmetrical Chest wall movement, Good air movement bilaterally, few bilat crackles  RRR,No Gallops,Rubs or new Murmurs, No Parasternal Heave +ve B.Sounds, Abd Soft, Non tender, No organomegaly appriciated, No rebound -guarding or rigidity. No Cyanosis, Clubbing or edema, No new Rash or bruise  Data Review   CBC w Diff: Lab Results  Component Value Date   WBC 13.0* 06/21/2013   HGB 9.3* 06/21/2013   HCT 27.7* 06/21/2013   PLT 462* 06/21/2013   LYMPHOPCT 10* 06/15/2013   MONOPCT 12 06/15/2013   EOSPCT 0 06/15/2013   BASOPCT 0 06/15/2013    CMP: Lab Results  Component Value Date   NA 137 06/21/2013   K 4.7 06/21/2013   CL 101 06/21/2013   CO2 24 06/21/2013   BUN 7 06/21/2013   CREATININE 0.83 06/21/2013   CREATININE 0.88 09/04/2012   PROT 5.9* 06/21/2013   ALBUMIN 1.5* 06/21/2013   BILITOT 0.7 06/21/2013   ALKPHOS 352* 06/21/2013   AST 29 06/21/2013   ALT 25 06/21/2013  .   Total Time in preparing paper work, data evaluation and todays exam - 35 minutes  Thurnell Lose M.D on 06/23/2013 at 12:38 PM  [facility] Office  [phone]   **Disclaimer: This note may have been dictated with voice recognition software. Similar sounding words can inadvertently be transcribed and this note may contain transcription errors which may not have been corrected upon publication of note.**

## 2013-06-23 NOTE — Progress Notes (Signed)
Appropriate dose of Cefepime for this patient (if needed) would be 1gm IV q12h. Please call if further questions.  Christoper Fabianaron Amend, PharmD, BCPS Clinical pharmacist, pager [phone] 06/23/2013  9:22 AM

## 2013-06-24 LAB — CREATININE, SERUM: CREATININE: 0.83 mg/dL (ref 0.50–1.35)

## 2013-06-24 MED ORDER — HEPARIN SOD (PORK) LOCK FLUSH 100 UNIT/ML IV SOLN
250.0000 [IU] | Freq: Every day | INTRAVENOUS | Status: DC
  Filled 2013-06-24: qty 3

## 2013-06-24 MED ORDER — HEPARIN SOD (PORK) LOCK FLUSH 100 UNIT/ML IV SOLN
250.0000 [IU] | INTRAVENOUS | Status: DC | PRN
  Administered 2013-06-24: 250 [IU]
  Filled 2013-06-24: qty 3

## 2013-06-24 NOTE — Progress Notes (Signed)
[facility]                                                                                Patient Demographics  Michael [name], is a 53 y.o. male  MVH:[id number]  [id number]:[id number]  DOB - 11/13/1960  Admit date - 06/13/2013  Admitting Physician Hillary BowJared M Gardner, DO  Outpatient Primary MD for the patient is PROVIDER NOT IN SYSTEM  LOS - 11   Chief Complaint  Patient presents with  . Chest Pain  . Fever  . Pelvic Pain        Assessment & Plan    Patient seen briefly today due for discharge soon per Discharge done yesterday by me, no further issues, Vital signs stable, patient feels fine.      Medications  Scheduled Meds: . carbamazepine  200 mg Oral BID  . FLUoxetine  20 mg Oral BID  . heparin  5,000 Units Subcutaneous 3 times per day  . ipratropium-albuterol  3 mL Nebulization TID  . mometasone-formoterol  2 puff Inhalation BID  . pantoprazole  40 mg Oral Daily  . piperacillin-tazobactam (ZOSYN)  IV  3.375 g Intravenous Q8H  . sodium chloride  10-40 mL Intracatheter Q12H  . vancomycin  1,250 mg Intravenous Q12H   Continuous Infusions:  PRN Meds:.acetaminophen, albuterol, HYDROcodone-homatropine, sodium chloride, sodium chloride    Time Spent in minutes   10 minutes   Susa RaringSINGH,PRASHANT K M.D on 06/24/2013 at 8:31 AM  Between 7am to 7pm - Pager - [phone]  After 7pm go to [email or web address] - password [email or web address]  And look for the night coverage person covering for me after hours  [facility] Office  [phone]    Subjective:   Michael PeaksRoy [place] today has, No headache, No chest pain, No abdominal pain - No Nausea, No new weakness tingling or numbness, No Cough - SOB.    Objective:   Filed Vitals:   06/23/13 2048 06/23/13 2051 06/23/13 2217 06/24/13 0611  BP:   125/61 117/61  Pulse:    63  Temp:   97.9 F (36.6 C) 97.8 F (36.6 C)  TempSrc:   Oral Oral  Resp:   20 20  Height:      Weight:      SpO2: 94% 94% 99% 97%    Wt Readings  from Last 3 Encounters:  06/21/13 62.46 kg (137 lb 11.2 oz)  10/23/12 59.603 kg (131 lb 6.4 oz)  09/14/12 58.06 kg (128 lb)     Intake/Output Summary (Last 24 hours) at 06/24/13 0831 Last data filed at 06/23/13 1810  Gross per 24 hour  Intake    720 ml  Output      0 ml  Net    720 ml    Exam Awake Alert, Oriented X 3, No new F.N deficits, Normal affect [place].AT,PERRAL Supple Neck,No JVD, No cervical lymphadenopathy appriciated.  Symmetrical Chest wall movement, Good air movement bilaterally, few bilat rales RRR,No Gallops,Rubs or new Murmurs, No Parasternal Heave +ve B.Sounds, Abd Soft, Non tender, No organomegaly appriciated, No rebound - guarding or rigidity. No Cyanosis, Clubbing or edema, No new Rash or bruise     Data Review

## 2013-06-24 NOTE — Progress Notes (Signed)
[facility] called and made aware of discharge home today with Iv antibiotics; all services and arrangements have been made by [facility]; Abelino DerrickB Chandler RN,BSN,MHA [phone]

## 2013-06-24 NOTE — Progress Notes (Signed)
Pt DC'd home via car with family.  Also, with [facility] to continue antibiotic treatment.  PICC line left in for treatment continuation.  DC instructions given and understood by patient and family.  Vital signs and assessments were stable.

## 2013-06-27 ENCOUNTER — Telehealth: Payer: Self-pay | Admitting: *Deleted

## 2013-06-27 NOTE — Telephone Encounter (Signed)
Vanc trough level 26.2.  [facility] Pharmacist, Larita FifeLynn will adjust Vanc today.

## 2013-07-03 ENCOUNTER — Ambulatory Visit (INDEPENDENT_AMBULATORY_CARE_PROVIDER_SITE_OTHER): Payer: Commercial Managed Care - HMO | Admitting: Internal Medicine

## 2013-07-03 ENCOUNTER — Ambulatory Visit
Admission: RE | Admit: 2013-07-03 | Discharge: 2013-07-03 | Disposition: A | Discharge status: Home / Self Care | Payer: Commercial Managed Care - HMO | Source: Ambulatory Visit | Attending: Internal Medicine | Admitting: Internal Medicine

## 2013-07-03 ENCOUNTER — Encounter: Payer: Self-pay | Admitting: Internal Medicine

## 2013-07-03 VITALS — BP 124/79 | HR 80 | Temp 97.1°F | Wt 137.0 lb

## 2013-07-03 DIAGNOSIS — J852 Abscess of lung without pneumonia: Secondary | ICD-10-CM

## 2013-07-03 DIAGNOSIS — A310 Pulmonary mycobacterial infection: Secondary | ICD-10-CM

## 2013-07-03 NOTE — Progress Notes (Addendum)
Subjective:    Patient ID: Michael [name], male    DOB: 12/16/1960, 53 y.o.   MRN: [id number]  HPI Michael [name] is a 53 y.o. 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 Admitted for have lul infiltrate with possible cavitary lesions. 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 On vanco and unasyn. mtb ruled out. He was discharge on 06/23/13 to be on cefepime,flagyl and vancomycin for 2 wks. He is here for follow up.  Home health Labs on Monday show wbc 6.5, hgb 99, plt 579, sed rate of 50, bun 0.73, vanco 17, crp 0.9.  Micro lab called today that his cx AFB+ and MAC probe positive.  Current Outpatient Prescriptions on File Prior to Visit  Medication Sig Dispense Refill  . albuterol (PROVENTIL HFA;VENTOLIN HFA) 108 (90 BASE) MCG/ACT inhaler Inhale 2 puffs into the lungs every 6 (six) hours as needed for wheezing.      . carbamazepine (CARBATROL) 200 MG 12 hr capsule Take 200 mg by mouth 2 (two) times daily.      [name]. ceFEPIme 1 g in dextrose 5 % 50 mL Inject 1 g into the vein every 12 (twelve) hours.  28 ampule  0  . FLUoxetine (PROZAC) 20 MG capsule Take 20 mg by mouth 2 (two) times daily.      . Fluticasone-Salmeterol (ADVAIR) 100-50 MCG/DOSE AEPB Inhale 1 puff into the lungs every 12 (twelve) hours.      [name]. ipratropium (ATROVENT HFA) 17 MCG/ACT inhaler Inhale 2 puffs into the lungs every 6 (six) hours.      [name]. ipratropium-albuterol (DUONEB) 0.5-2.5 (3) MG/3ML SOLN Take 3 mLs by nebulization every 4 (four) hours as needed (SOB).  360 mL  1  . lactose free nutrition (BOOST PLUS) LIQD Take 237 mLs by mouth 3 (three) times daily with meals.  90 Can  0  . metroNIDAZOLE (FLAGYL) 250 MG SOLN IVPB 500 mg IV 3 times a day, 2 week supply.  42 Syringe  0  . naproxen sodium (ANAPROX) 220 MG tablet Take 440 mg by mouth 3  (three) times daily as needed (pain).      [name]. omeprazole (PRILOSEC) 20 MG capsule Take 20 mg by mouth 2 (two) times daily.       . vancomycin 1,250 mg in sodium chloride 0.9 % 250 mL Inject 1,250 mg into the vein every 12 (twelve) hours.  28 ampule  0   No current facility-administered medications on file prior to visit.   Active Ambulatory Problems    Diagnosis Date Noted  . Symptomatic bradycardia 09/06/2012  . Pacemaker - Medtronic 2005, generator change September 2014 09/06/2012  . Weight loss 09/14/2012  . CAP (community acquired pneumonia) 06/14/2013  . Protein-calorie malnutrition, severe 06/14/2013  . COPD exacerbation 06/19/2013   Resolved Ambulatory Problems    Diagnosis Date Noted  . No Resolved Ambulatory Problems   Past Medical History  Diagnosis Date  . Presence of permanent cardiac pacemaker April 2005  . Bradycardia   . Tobacco abuse   . Chronic chest pain   . Seizure disorder   . History of nuclear stress test 06/14/2011  . COPD (chronic obstructive pulmonary disease)   . Shortness of breath   . Pneumonia   . Anginal pain   . Depression  Review of Systems     Objective:   Physical Exam BP 124/79  Pulse 80  Temp(Src) 97.1 F (36.2 C) (Oral)  Wt 137 lb (62.143 kg) Physical Exam  Constitutional: He is oriented to person, place, and time. He appears well-developed and well-nourished. No distress.  HENT:  Mouth/Throat: Oropharynx is clear and moist. No oropharyngeal exudate.  Cardiovascular: Normal rate, regular rhythm and normal heart sounds. Exam reveals no gallop and no friction rub.  No murmur heard.  Pulmonary/Chest: Effort normal and breath sounds normal. No respiratory distress. He has no wheezes.  Abdominal: Soft. Bowel sounds are normal. He exhibits no distension. There is no tenderness.  Lymphadenopathy:  He has no cervical adenopathy.  Neurological: He is alert and oriented to person, place, and time.  Skin: Skin is warm and dry. No  rash noted. No erythema.  Psychiatric: He has a normal mood and affect. His behavior is normal.   Imaging today:  There has been considerable improvement in the appearance of the  left upper lobe since the previous study but increased density  persists. The right lung is hyperinflated and clear. There is no  pleural effusion. The heart is normal in size. The permanent  pacemaker is unchanged in position. A PICC line is been placed with  the tip in the midportion of the SVC. The bony thorax is  unremarkable.  IMPRESSION:  There has been considerable improvement in the left upper lobe but  residual abnormality is demonstrated. An additional follow-up film  in 3-4 weeks is recommended if the patient is clinically stable. If  the patient is experiencing acute symptoms, chest CT scanning now  would be useful.     Assessment & Plan:  Pulmonary abscess: cxr shows much improvement since imaging from hospital. We will call family to see how much iv antibiotics they have left to finish and convert over to augmentin to treat for addn 2-3 wk.   Pulmonary mac infection = will ask micro lab to do sensitivity testing and anticipate to treat especially if further specimens become positive.  rtc in 3- 4 wk

## 2013-07-04 ENCOUNTER — Telehealth: Payer: Self-pay | Admitting: *Deleted

## 2013-07-04 NOTE — Telephone Encounter (Signed)
Patient's mother called for his chest xray results. He only has one more dose of vancomycin left. Please advise on results and if the IV antibiotic needs to be continued. Michael [name]

## 2013-07-08 ENCOUNTER — Ambulatory Visit (INDEPENDENT_AMBULATORY_CARE_PROVIDER_SITE_OTHER): Payer: Commercial Managed Care - HMO | Admitting: Internal Medicine

## 2013-07-08 ENCOUNTER — Encounter: Payer: Self-pay | Admitting: Internal Medicine

## 2013-07-08 ENCOUNTER — Telehealth: Payer: Self-pay | Admitting: *Deleted

## 2013-07-08 VITALS — BP 138/80 | HR 80 | Temp 97.9°F | Ht 67.0 in | Wt 139.6 lb

## 2013-07-08 DIAGNOSIS — J189 Pneumonia, unspecified organism: Secondary | ICD-10-CM

## 2013-07-08 DIAGNOSIS — J4489 Other specified chronic obstructive pulmonary disease: Secondary | ICD-10-CM

## 2013-07-08 DIAGNOSIS — J449 Chronic obstructive pulmonary disease, unspecified: Secondary | ICD-10-CM | POA: Insufficient documentation

## 2013-07-08 MED ORDER — BUDESONIDE-FORMOTEROL FUMARATE 160-4.5 MCG/ACT IN AERO
INHALATION_SPRAY | RESPIRATORY_TRACT | Status: DC
Start: 2013-07-08 — End: 2013-07-22

## 2013-07-08 NOTE — Assessment & Plan Note (Addendum)
-   reports quit smokng 06/2013 - 07/08/2013 p extensive coaching HFA effectiveness =    75% > rx symbicort 160 2bid and prn combivent     I reviewed the Flethcher curve with patient that basically indicates  if you quit smoking when your best day FEV1 is still well preserved (as is probably the case here)  it is highly unlikely you will progress to severe disease and informed the patient there was no medication on the market that has proven to change the curve or the likelihood of progression.  Therefore stopping   maintaining abstinence is the most important aspect of care, not choice of inhalers or for that matter, doctors.      Each maintenance medication was reviewed in detail including most importantly the difference between maintenance and as needed and under what circumstances the prns are to be used.  Please see instructions for details which were reviewed in writing and the patient given a copy.

## 2013-07-08 NOTE — Telephone Encounter (Signed)
Sorry for the delay. pls coordinate with [facility], [facility] should pull out picc line since cxr looks better. Recommend that we swithc to oral antibiotics

## 2013-07-08 NOTE — Progress Notes (Signed)
Subjective:    Patient ID: Michael [name], male    DOB: 01/30/1960  MRN: [id number]  HPI  53 yowm quit smoking x one week pta:  Admission date: 06/13/2013 Admitting Physician Hillary BowJared M Gardner, DO  Discharge Date: 06/23/2013   Admission Diagnosis  UTI (lower urinary tract infection) [599.0]  Weight loss [783.21]  CAP (community acquired pneumonia) [486]  Hypotension [458.9]  Discharge Diagnosis UTI (lower urinary tract infection) [599.0]  Weight loss [783.21]  CAP (community acquired pneumonia) [486]  Hypotension [458.9]  Lung abscess  Principal Problem:  COPD exacerbation  Active Problems:  Symptomatic bradycardia  Pacemaker - Medtronic 2005, generator change September 2014  CAP (community acquired pneumonia)  Protein-calorie malnutrition, severe      07/08/2013 [facility] office visit/ [facility] Papand[facility]  Chief Complaint  Patient presents with  . Pulmonary Consult    Referred after hospital d/c.  He states that he has been having SOB and cough for several months. Was dxed with PNA in June 2015. Cough is non prod and "raspy".   weak for over a year, not breathing well at baseline, better since d/c Still weak p discharge  Raspy cough  L pleuritic cp   No obvious   patterns in day to day or daytime variabilty or assoc chest tightness, subjective wheeze overt sinus or hb symptoms. No unusual exp hx or h/o childhood pna/ asthma or knowledge of premature birth.  Sleeping ok without nocturnal  or early am exacerbation  of respiratory  c/o's or need for noct saba. Also denies any obvious fluctuation of symptoms with weather or environmental changes or other aggravating or alleviating factors except as outlined above   Current Medications, Allergies, Complete Past Medical History, Past Surgical History, Family History, and Social History were reviewed in [facility] electronic medical record.           .     Review of Systems  Constitutional: Positive for appetite change  and unexpected weight change. Negative for fever, chills and activity change.  HENT: Positive for congestion and dental problem. Negative for postnasal drip, rhinorrhea, sneezing, sore throat, trouble swallowing and voice change.   Eyes: Negative for visual disturbance.  Respiratory: Positive for cough and shortness of breath. Negative for choking.   Cardiovascular: Negative for chest pain and leg swelling.  Gastrointestinal: Negative for nausea, vomiting and abdominal pain.  Genitourinary: Negative for difficulty urinating.  Musculoskeletal: Negative for arthralgias.  Skin: Negative for rash.  Psychiatric/Behavioral: Negative for behavioral problems and confusion.       Objective:   Physical Exam  Wt Readings from Last 3 Encounters:  07/08/13 139 lb 9.6 oz (63.322 kg)  07/03/13 137 lb (62.143 kg)  06/21/13 137 lb 11.2 oz (62.46 kg)      HEENT mild turbinate edema.  Oropharynx no thrush or excess pnd or cobblestoning.  No JVD or cervical adenopathy. Mild accessory muscle hypertrophy. Trachea midline, nl thryroid. Chest was hyperinflated by percussion with diminished breath sounds and moderate increased exp time without wheeze. Hoover sign positive at mid inspiration. Regular rate and rhythm without murmur gallop or rub or increase P2 or edema.  Abd: no hsm, nl excursion. Ext warm without cyanosis or clubbing.      07/03/13 There has been considerable improvement in the left upper lobe but  residual abnormality is demonstrated. An additional follow-up film  in 3-4 weeks is recommended if the patient is clinically stable - cxr 09/06/12 ok x copd  Assessment & Plan:

## 2013-07-08 NOTE — Telephone Encounter (Signed)
A[facility] at [facility] called to advise that the patient has completed his 3 IV drugs and sees Dr Sherene SiresWert today. She was wanting orders to D/C PICC or extend medication. Advised her will give the doctor a call and call her back with the answer. She also advised she drew the normal labs today if the doctor wants to wait until they are resulted.

## 2013-07-08 NOTE — Assessment & Plan Note (Addendum)
Marked improvement vs nadir, needs f/u in 2 weeks and put off fob for now

## 2013-07-08 NOTE — Patient Instructions (Addendum)
Plan A = automatic = Symbicort (RED)  160 Take 2 puffs first thing in am and then another 2 puffs about 12 hours later.   Plan B =  Backup = atrovent > add 2puff every 4 hours if needed   Plan C = Contingency> duoneb up to every 4hours only to be use after you try Plan B first   Congratulations on not smoking, it's the most important aspect of your care!  Please schedule a follow up office visit in 2 weeks, sooner if needed with cxr on return

## 2013-07-09 ENCOUNTER — Ambulatory Visit: Payer: Medicare PPO | Attending: Internal Medicine | Admitting: Internal Medicine

## 2013-07-09 ENCOUNTER — Telehealth: Payer: Self-pay | Admitting: *Deleted

## 2013-07-09 ENCOUNTER — Encounter: Payer: Self-pay | Admitting: Internal Medicine

## 2013-07-09 ENCOUNTER — Encounter: Payer: Self-pay | Admitting: *Deleted

## 2013-07-09 VITALS — BP 102/58 | HR 72 | Temp 97.4°F | Resp 14 | Ht 68.0 in | Wt 136.6 lb

## 2013-07-09 DIAGNOSIS — Z0000 Encounter for general adult medical examination without abnormal findings: Secondary | ICD-10-CM | POA: Diagnosis not present

## 2013-07-09 DIAGNOSIS — Z23 Encounter for immunization: Secondary | ICD-10-CM

## 2013-07-09 DIAGNOSIS — J449 Chronic obstructive pulmonary disease, unspecified: Secondary | ICD-10-CM | POA: Diagnosis not present

## 2013-07-09 DIAGNOSIS — Z87891 Personal history of nicotine dependence: Secondary | ICD-10-CM | POA: Insufficient documentation

## 2013-07-09 DIAGNOSIS — J189 Pneumonia, unspecified organism: Secondary | ICD-10-CM | POA: Insufficient documentation

## 2013-07-09 DIAGNOSIS — J4489 Other specified chronic obstructive pulmonary disease: Secondary | ICD-10-CM | POA: Insufficient documentation

## 2013-07-09 MED ORDER — AMOXICILLIN-POT CLAVULANATE 875-125 MG PO TABS: 1.0000 | ORAL_TABLET | Freq: Two times a day (BID) | ORAL | Status: DC

## 2013-07-09 NOTE — Progress Notes (Signed)
Patient ID: Michael [name], male   DOB: 07/11/1960, 53 y.o.   MRN: [id number]  [id number]  [id number]  DOB - 10/06/1960  CC:  Chief Complaint  Patient presents with  . Hospitalization Follow-up  . Establish Care  . Pneumonia       HPI: Michael [name] is a 53 y.o. male here today to establish medical care. Michael [name] presents for a follow up from a hospital visit due to  pneumonia. Today he reports shortness of breath for the past week that he experiences at rest and with activity. He does not report that his dyspnea is worse any time of day. There is a related productive cough that is producing yellow sputum. He is also complaining of left chest pain that he reports as achy and shooting. He states that it is the same pain he felt last week related to his pneumonia/pleurisy. He currently rates it as a 7 out of 10. He has a chest x-ray scheduled by Dr. Sherene SiresWert in two weeks. He has an extensive history and is followed by cardiology, pulmonology, and infectious disease.    Allergies  Allergen Reactions  . Shellfish Allergy Nausea And Vomiting   Past Medical History  Diagnosis Date  . 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 Bradycardia   . Tobacco abuse   . Chronic chest pain   . Seizure disorder   . History of nuclear stress test 06/14/2011    lexiscan; mild perfusion defect in basal inferosetpal & apical inferior region; negative for ischemia   . COPD (chronic obstructive pulmonary disease)   . Shortness of breath   . Pneumonia   . Anginal pain   . Pacemaker   . Depression    Current Outpatient Prescriptions on File Prior to Visit  Medication Sig Dispense Refill  . albuterol (PROVENTIL HFA;VENTOLIN HFA) 108 (90 BASE) MCG/ACT inhaler Inhale 2 puffs into the lungs every 6 (six) hours as needed for wheezing.      [name]. amoxicillin-clavulanate (AUGMENTIN) 875-125 MG per tablet Take 1 tablet by mouth 2 (two) times daily.  42 tablet  0  . budesonide-formoterol  (SYMBICORT) 160-4.5 MCG/ACT inhaler Take 2 puffs first thing in am and then another 2 puffs about 12 hours later.      . carbamazepine (CARBATROL) 200 MG 12 hr capsule Take 200 mg by mouth 2 (two) times daily.      [name]. FLUoxetine (PROZAC) 20 MG capsule Take 20 mg by mouth 2 (two) times daily.      [name]. ipratropium (ATROVENT HFA) 17 MCG/ACT inhaler Inhale 2 puffs into the lungs every 6 (six) hours.      [name]. ipratropium-albuterol (DUONEB) 0.5-2.5 (3) MG/3ML SOLN Take 3 mLs by nebulization every 4 (four) hours as needed (SOB).  360 mL  1  . naproxen sodium (ANAPROX) 220 MG tablet Take 440 mg by mouth 3 (three) times daily as needed (pain).      [name]. omeprazole (PRILOSEC) 20 MG capsule Take 20 mg by mouth 2 (two) times daily.        No current facility-administered medications on file prior to visit.   Family History  Problem Relation Age of Onset  . Heart Problems Mother     MVP  . CAD Father   . COPD Father   . Heart disease Father   . Heart Problems Other     aunts x 2 died of heart problems   . Heart attack Maternal Grandmother   .  Hypertension Maternal Grandfather   . Heart attack Paternal Grandmother   . Heart attack Paternal Grandfather    History   Social History  . Marital Status: Single    Spouse Name: N/A    Number of Children: 2  . Years of Education: 12   Occupational History  . Disabled    Social History Main Topics  . Smoking status: Former Smoker -- 1.00 packs/day for 40 years    Types: Cigarettes    Quit date: 06/06/2013  . Smokeless tobacco: Never Used  . Alcohol Use: No  . Drug Use: No  . Sexual Activity: Not Currently   Other Topics Concern  . Not on file   Social History Narrative  . No narrative on file   Review of Systems  Constitutional: Positive for chills.  Respiratory: Positive for cough, sputum production (yellow sputum) and shortness of breath.   Cardiovascular: Positive for chest pain (midsternal sharp pain, left achy pain near pacemaker  insertion).  Skin: Negative.   Neurological: Positive for dizziness and weakness.       Objective:   Filed Vitals:   07/09/13 1412  BP: 102/58  Pulse: 72  Temp: 97.4 F (36.3 C)  Resp: 14    Physical Exam: Constitutional: Patient appears well-developed and well-nourished. No distress. HENT: Normocephalic, atraumatic, External right and left ear normal. Oropharynx is clear and moist.  Eyes: Conjunctivae and EOM are normal. PERRLA, no scleral icterus. Neck: Normal ROM. Neck supple. No JVD. No tracheal deviation CVS: RRR, S1/S2 +, no murmurs, no gallops, no carotid bruit, non-diaphoretic.  Pulmonary: Effort and breath sounds normal, no stridor, rhonchi, wheezes, rales.  Abdominal: Soft. BS +, no distension, tenderness, rebound or guarding.  Musculoskeletal: Normal range of motion. No edema and no tenderness, right upper arm PICC line present dressing clean dry and intact Lymphadenopathy: No lymphadenopathy noted, cervical Neuro: Alert. Normal reflexes, muscle tone coordination. No cranial nerve deficit. Skin: Skin is warm and dry. No rash noted. Not diaphoretic. No erythema. No pallor. Psychiatric: Normal mood and affect. Behavior, judgment, thought content normal.  Physical Exam   Lab Results  Component Value Date   WBC 13.0* 06/21/2013   HGB 9.3* 06/21/2013   HCT 27.7* 06/21/2013   MCV 100.0 06/21/2013   PLT 462* 06/21/2013   Lab Results  Component Value Date   CREATININE 0.83 06/24/2013   BUN 7 06/21/2013   NA 137 06/21/2013   K 4.7 06/21/2013   CL 101 06/21/2013   CO2 24 06/21/2013    No results found for this basename: HGBA1C   Lipid Panel     Component Value Date/Time   CHOL 153 09/04/2012 0839   TRIG 96 09/04/2012 0839   HDL 45 09/04/2012 0839   CHOLHDL 3.4 09/04/2012 0839   VLDL 19 09/04/2012 0839   LDLCALC 89 09/04/2012 0839       Assessment and plan:   Michael [name] was seen today for hospitalization follow-up, establish care and pneumonia.  Diagnoses and associated  orders for this visit:  Preventative health care - HM COLONOSCOPY - Tdap vaccine greater than or equal to 7yo IM  CAP (community acquired pneumonia) The patient was instructed to follow up with infectious disease for PICC line removal Instructed patient to complete full course of Augmentin prescribed by Infectious Disease. COPD mixed type Keep follow up with pulmonology   Return if symptoms worsen or fail to improve.  The patient was given clear instructions to go to ER or return to medical center  if symptoms don't improve, worsen or new problems develop. The patient verbalized understanding.   Holland Commons, NP-C [facility] [phone] 07/09/2013, 2:29 PM

## 2013-07-09 NOTE — Telephone Encounter (Signed)
See note from 07/08/13. All is done.

## 2013-07-09 NOTE — Telephone Encounter (Addendum)
Called Amy and advised her per Dr Drue SecondSnider to D/C the patient PICC and advise him we have sent in Rx for oral Augmentin to his pharmacy. Reminded the patient of his visit 07/25/13

## 2013-07-09 NOTE — Addendum Note (Signed)
Addended by: Lurlean LeydenPOOLE, TRAVIS F on: 07/09/2013 09:51 AM   Modules accepted: Orders

## 2013-07-09 NOTE — Progress Notes (Signed)
Patient presents to establish care following hospitalization for pneumonia. Mother present

## 2013-07-09 NOTE — Telephone Encounter (Signed)
error 

## 2013-07-09 NOTE — Telephone Encounter (Signed)
Michael [name] took care of this

## 2013-07-09 NOTE — Patient Instructions (Signed)
DASH Eating Plan  DASH stands for "Dietary Approaches to Stop Hypertension." The DASH eating plan is a healthy eating plan that has been shown to reduce high blood pressure (hypertension). Additional health benefits may include reducing the risk of type 2 diabetes mellitus, heart disease, and stroke. The DASH eating plan may also help with weight loss.  WHAT DO I NEED TO KNOW ABOUT THE DASH EATING PLAN?  For the DASH eating plan, you will follow these general guidelines:  · Choose foods with a percent daily value for sodium of less than 5% (as listed on the food label).  · Use salt-free seasonings or herbs instead of table salt or sea salt.  · Check with your health care provider or pharmacist before using salt substitutes.  · Eat lower-sodium products, often labeled as "lower sodium" or "no salt added."  · Eat fresh foods.  · Eat more vegetables, fruits, and low-fat dairy products.  · Choose whole grains. Look for the word "whole" as the first word in the ingredient list.  · Choose fish and skinless chicken or turkey more often than red meat. Limit fish, poultry, and meat to 6 oz (170 g) each day.  · Limit sweets, desserts, sugars, and sugary drinks.  · Choose heart-healthy fats.  · Limit cheese to 1 oz (28 g) per day.  · Eat more home-cooked food and less restaurant, buffet, and fast food.  · Limit fried foods.  · Cook foods using methods other than frying.  · Limit canned vegetables. If you do use them, rinse them well to decrease the sodium.  · When eating at a restaurant, ask that your food be prepared with less salt, or no salt if possible.  WHAT FOODS CAN I EAT?  Seek help from a dietitian for individual calorie needs.  Grains  Whole grain or whole wheat bread. Brown rice. Whole grain or whole wheat pasta. Quinoa, bulgur, and whole grain cereals. Low-sodium cereals. Corn or whole wheat flour tortillas. Whole grain cornbread. Whole grain crackers. Low-sodium crackers.  Vegetables  Fresh or frozen vegetables  (raw, steamed, roasted, or grilled). Low-sodium or reduced-sodium tomato and vegetable juices. Low-sodium or reduced-sodium tomato sauce and paste. Low-sodium or reduced-sodium canned vegetables.   Fruits  All fresh, canned (in natural juice), or frozen fruits.  Meat and Other Protein Products  Ground beef (85% or leaner), grass-fed beef, or beef trimmed of fat. Skinless chicken or turkey. Ground chicken or turkey. Pork trimmed of fat. All fish and seafood. Eggs. Dried beans, peas, or lentils. Unsalted nuts and seeds. Unsalted canned beans.  Dairy  Low-fat dairy products, such as skim or 1% milk, 2% or reduced-fat cheeses, low-fat ricotta or cottage cheese, or plain low-fat yogurt. Low-sodium or reduced-sodium cheeses.  Fats and Oils  Tub margarines without trans fats. Light or reduced-fat mayonnaise and salad dressings (reduced sodium). Avocado. Safflower, olive, or canola oils. Natural peanut or almond butter.  Other  Unsalted popcorn and pretzels.  The items listed above may not be a complete list of recommended foods or beverages. Contact your dietitian for more options.  WHAT FOODS ARE NOT RECOMMENDED?  Grains  White bread. White pasta. White rice. Refined cornbread. Bagels and croissants. Crackers that contain trans fat.  Vegetables  Creamed or fried vegetables. Vegetables in a cheese sauce. Regular canned vegetables. Regular canned tomato sauce and paste. Regular tomato and vegetable juices.  Fruits  Dried fruits. Canned fruit in light or heavy syrup. Fruit juice.  Meat and Other Protein   Products  Fatty cuts of meat. Ribs, chicken wings, bacon, sausage, bologna, salami, chitterlings, fatback, hot dogs, bratwurst, and packaged luncheon meats. Salted nuts and seeds. Canned beans with salt.  Dairy  Whole or 2% milk, cream, half-and-half, and cream cheese. Whole-fat or sweetened yogurt. Full-fat cheeses or blue cheese. Nondairy creamers and whipped toppings. Processed cheese, cheese spreads, or cheese  curds.  Condiments  Onion and garlic salt, seasoned salt, table salt, and sea salt. Canned and packaged gravies. Worcestershire sauce. Tartar sauce. Barbecue sauce. Teriyaki sauce. Soy sauce, including reduced sodium. Steak sauce. Fish sauce. Oyster sauce. Cocktail sauce. Horseradish. Ketchup and mustard. Meat flavorings and tenderizers. Bouillon cubes. Hot sauce. Tabasco sauce. Marinades. Taco seasonings. Relishes.  Fats and Oils  Butter, stick margarine, lard, shortening, ghee, and bacon fat. Coconut, palm kernel, or palm oils. Regular salad dressings.  Other  Pickles and olives. Salted popcorn and pretzels.  The items listed above may not be a complete list of foods and beverages to avoid. Contact your dietitian for more information.  WHERE CAN I FIND MORE INFORMATION?  National Heart, Lung, and Blood Institute: www.nhlbi.nih.gov/health/health-topics/topics/dash/  Document Released: 12/09/2010 Document Revised: 12/25/2012 Document Reviewed: 10/24/2012  ExitCare® Patient Information ©2015 [employer]. This information is not intended to replace advice given to you by your health care provider. Make sure you discuss any questions you have with your health care provider.

## 2013-07-09 NOTE — Telephone Encounter (Signed)
Message copied by Lurlean LeydenPOOLE, Joane Postel F on Tue Jul 09, 2013  9:41 AM ------      Message from: Judyann MunsonSNIDER, CYNTHIA      Created: Mon Jul 08, 2013  7:47 PM       Can you call [facility] to have the picc line pulled. We can convert him to oral antibiotics. Augment 875mg  BID #42 tabs NR. Also can we set up appointment to have him come back in 3wk.  ------

## 2013-07-22 ENCOUNTER — Ambulatory Visit (INDEPENDENT_AMBULATORY_CARE_PROVIDER_SITE_OTHER)
Admission: RE | Admit: 2013-07-22 | Discharge: 2013-07-22 | Disposition: A | Discharge status: Home / Self Care | Payer: Commercial Managed Care - HMO | Source: Ambulatory Visit | Attending: Internal Medicine | Admitting: Internal Medicine

## 2013-07-22 ENCOUNTER — Ambulatory Visit (INDEPENDENT_AMBULATORY_CARE_PROVIDER_SITE_OTHER): Payer: Commercial Managed Care - HMO | Admitting: Internal Medicine

## 2013-07-22 ENCOUNTER — Encounter: Payer: Self-pay | Admitting: Internal Medicine

## 2013-07-22 VITALS — BP 110/70 | HR 61 | Temp 97.8°F | Ht 68.0 in | Wt 141.8 lb

## 2013-07-22 DIAGNOSIS — J189 Pneumonia, unspecified organism: Secondary | ICD-10-CM

## 2013-07-22 DIAGNOSIS — J449 Chronic obstructive pulmonary disease, unspecified: Secondary | ICD-10-CM

## 2013-07-22 MED ORDER — BUDESONIDE-FORMOTEROL FUMARATE 160-4.5 MCG/ACT IN AERO: INHALATION_SPRAY | RESPIRATORY_TRACT | Status: DC

## 2013-07-22 NOTE — Assessment & Plan Note (Signed)
See cxr 06/13/13 ? Correlation with chronic symptoms x sev months pta  Marked serial improvement but still abn vs 09/2012 therefore ? Underlying ca or is this all scarring (CT suggested pna 06/13/13)  Therefore f/u in one month, sooner if symptoms relapse

## 2013-07-22 NOTE — Patient Instructions (Addendum)
Continue symbicort 160 Take 2 puffs first thing in am and then another 2 puffs about 12 hours later.   Work on inhaler technique:  relax and gently blow all the way out then take a nice smooth deep breath back in, triggering the inhaler at same time you start breathing in.  Hold for up to 5 seconds if you can.  Rinse and gargle with water when done   If your mouth or throat starts to bother you,   I suggest you time the inhaler to your dental care and after using the inhaler(s) brush teeth and tongue with a baking soda containing toothpaste and when you rinse this out, gargle with it first to see if this helps your mouth and throat.     Please schedule a follow up office visit in 4 weeks, sooner if needed with cxr

## 2013-07-22 NOTE — Progress Notes (Signed)
Subjective:    Patient ID: Michael [name], male    DOB: September 23, 1960  MRN: [id number]    Brief patient profile:  53 yowm quit smoking x one week pta:  Admission date: 06/13/2013 Admitting Physician Hillary Bow, DO  Discharge Date: 06/23/2013   Admission Diagnosis  UTI (lower urinary tract infection) [599.0]  Weight loss [783.21]  CAP (community acquired pneumonia) [486]  Hypotension [458.9]  Discharge Diagnosis UTI (lower urinary tract infection) [599.0]  Weight loss [783.21]  CAP (community acquired pneumonia) [486]  Hypotension [458.9]  Lung abscess  Principal Problem:  COPD exacerbation  Active Problems:  Symptomatic bradycardia  Pacemaker - Medtronic 2005, generator change September 2014  CAP (community acquired pneumonia)  Protein-calorie malnutrition, severe     History of Present Illness  07/08/2013 1st [facility] office visit/ Michael [name]  Chief Complaint  Patient presents with  . Pulmonary Consult    Referred after hospital d/c.  He states that he has been having SOB and cough for several months. Was dxed with PNA in June 2015. Cough is non prod and "raspy".   weak for over a year, not breathing well at baseline, better since d/c Still weak p discharge  Raspy cough  L pleuritic cp  rec Plan A = automatic = Symbicort (RED)  160 Take 2 puffs first thing in am and then another 2 puffs about 12 hours later.  Plan B =  Backup = atrovent > add 2puff every 4 hours if needed  Plan C = Contingency> duoneb up to every 4hours only to be use after you try Plan B first    07/22/2013 f/u ov/Michael [name] re: f/u pna Chief Complaint  Patient presents with  . Follow-up    Pt reports his cough is about the same. Breathing seems some better with no need for atrovent or duoneb.   cp less freq, less severe but still having occ no purulent sputum, hemoptysis.  No obvious day to day or daytime variabilty or assoc chronic cough or cp or chest tightness, subjective wheeze overt sinus or hb  symptoms. No unusual exp hx or h/o childhood pna/ asthma or knowledge of premature birth.  Sleeping ok without nocturnal  or early am exacerbation  of respiratory  c/o's or need for noct saba. Also denies any obvious fluctuation of symptoms with weather or environmental changes or other aggravating or alleviating factors except as outlined above   Current Medications, Allergies, Complete Past Medical History, Past Surgical History, Family History, and Social History were reviewed in [employer] record.  ROS  The following are not active complaints unless bolded sore throat, dysphagia, dental problems, itching, sneezing,  nasal congestion or excess/ purulent secretions, ear ache,   fever, chills, sweats, unintended wt loss, pleuritic or exertional cp, hemoptysis,  orthopnea pnd or leg swelling, presyncope, palpitations, heartburn, abdominal pain, anorexia, nausea, vomiting, diarrhea  or change in bowel or urinary habits, change in stools or urine, dysuria,hematuria,  rash, arthralgias, visual complaints, headache, numbness weakness or ataxia or problems with walking or coordination,  change in mood/affect or memory.          Objective:   Physical Exam  07/22/2013       142  Wt Readings from Last 3 Encounters:  07/08/13 139 lb 9.6 oz (63.322 kg)  07/03/13 137 lb (62.143 kg)  06/21/13 137 lb 11.2 oz (62.46 kg)      HEENT mild turbinate edema.  Oropharynx no thrush or excess pnd or cobblestoning.  No JVD or cervical adenopathy. Mild accessory muscle hypertrophy. Trachea midline, nl thryroid. Chest was hyperinflated by percussion with diminished breath sounds and moderate increased exp time without wheeze. Hoover sign positive at mid inspiration. Regular rate and rhythm without murmur gallop or rub or increase P2 or edema.  Abd: no hsm, nl excursion. Ext warm without cyanosis or clubbing.      CXR  07/22/2013 :   Although considerably improved when compared to 06/18/2013,  the residual left upper lobe opacity is unchanged when compared to 07/03/2013. This may represent slow to clear pneumonia, but radiographic followup to document complete resolution is suggested.       Assessment & Plan:

## 2013-07-22 NOTE — Assessment & Plan Note (Signed)
-   reports quit smokng 06/2013  DDX of  difficult airways management all start with A and  include Adherence, Ace Inhibitors, Acid Reflux, Active Sinus Disease, Alpha 1 Antitripsin deficiency, Anxiety masquerading as Airways dz,  ABPA,  allergy(esp in young), Aspiration (esp in elderly), Adverse effects of DPI,  Active smokers, plus two Bs  = Bronchiectasis and Beta blocker use..and one C= CHF  Adherence is always the initial "prime suspect" and is a multilayered concern that requires a "trust but verify" approach in every patient - starting with knowing how to use medications, especially inhalers, correctly, keeping up with refills and understanding the fundamental difference between maintenance and prns vs those medications only taken for a very short course and then stopped and not refilled.  The proper method of use, as well as anticipated side effects, of a metered-dose inhaler are discussed and demonstrated to the patient. Improved effectiveness after extensive coaching during this visit to a level of approximately  75% but not better, advised to keep working on it  ? Active smoking > denies, encouraged to maintain abstinence  ? Acid (or non-acid) GERD > always difficult to exclude as up to 75% of pts in some series report no assoc GI/ Heartburn symptoms> rec continue max (24h)  acid suppression and diet restrictions/ reviewed     See instructions for specific recommendations which were reviewed directly with the patient who was given a copy with highlighter outlining the key components.

## 2013-07-25 ENCOUNTER — Ambulatory Visit (INDEPENDENT_AMBULATORY_CARE_PROVIDER_SITE_OTHER): Payer: Commercial Managed Care - HMO | Admitting: Internal Medicine

## 2013-07-25 ENCOUNTER — Encounter: Payer: Self-pay | Admitting: Internal Medicine

## 2013-07-25 VITALS — BP 133/85 | HR 80 | Temp 98.2°F | Wt 141.0 lb

## 2013-07-25 DIAGNOSIS — A310 Pulmonary mycobacterial infection: Secondary | ICD-10-CM

## 2013-07-25 MED ORDER — ETHAMBUTOL HCL 400 MG PO TABS: 1000.0000 mg | ORAL_TABLET | Freq: Every day | ORAL | Status: DC

## 2013-07-25 MED ORDER — CLARITHROMYCIN 500 MG PO TABS: 500.0000 mg | ORAL_TABLET | Freq: Every day | ORAL | Status: DC

## 2013-07-25 NOTE — Progress Notes (Signed)
   Subjective:    Patient ID: Michael [name], male    DOB: 11/19/1960, 53 y.o.   MRN: [id number]  HPI Michael [name] is a 53 y.o. 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 Admitted for have lul infiltrate with possible cavitary lesions. 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 On vanco and unasyn. mtb ruled out. He was discharge on 06/23/13 to be on cefepime,flagyl and vancomycin for 2 wks. Then in the last 2 wk has been changed over to taking augmentin. He is steadily imrpoving. Still had cxr 3 days ago that showed some infiltrate in the left upper lung present but improved from hospitalization  Allergies  Allergen Reactions  . Shellfish Allergy Nausea And Vomiting   Current Outpatient Prescriptions on File Prior to Visit  Medication Sig Dispense Refill  . budesonide-formoterol (SYMBICORT) 160-4.5 MCG/ACT inhaler Take 2 puffs first thing in am and then another 2 puffs about 12 hours later.  1 Inhaler  11  . carbamazepine (CARBATROL) 200 MG 12 hr capsule Take 200 mg by mouth 2 (two) times daily.      [name]. FLUoxetine (PROZAC) 20 MG capsule Take 20 mg by mouth 2 (two) times daily.      [name]. ipratropium (ATROVENT HFA) 17 MCG/ACT inhaler Inhale 2 puffs into the lungs every 6 (six) hours.      [name]. ipratropium-albuterol (DUONEB) 0.5-2.5 (3) MG/3ML SOLN Take 3 mLs by nebulization every 4 (four) hours as needed (SOB).  360 mL  1  . naproxen sodium (ANAPROX) 220 MG tablet Take 440 mg by mouth 3 (three) times daily as needed (pain).      [name]. omeprazole (PRILOSEC) 20 MG capsule Take 20 mg by mouth 2 (two) times daily.        No current facility-administered medications on file prior to visit.      Review of Systems 10 point ros is negative thru review, exxceptions noted in hpi    Objective:   Physical Exam BP 133/85   Pulse 80  Temp(Src) 98.2 F (36.8 C) (Oral)  Wt 141 lb (63.957 kg) Physical Exam  Constitutional: He is oriented to person, place, and time. He appears well-developed and well-nourished. No distress.  HENT:  Mouth/Throat: Oropharynx is clear and moist. No oropharyngeal exudate.  Cardiovascular: Normal rate, regular rhythm and normal heart sounds. Exam reveals no gallop and no friction rub.  No murmur heard.  Pulmonary/Chest: Effort normal and breath sounds normal. No respiratory distress. He has no wheezes.  Abdominal: Soft. Bowel sounds are normal. He exhibits no distension. There is no tenderness.  Lymphadenopathy:  He has no cervical adenopathy.  Neurological: He is alert and oriented to person, place, and time.  Skin: Skin is warm and dry. No rash noted. No erythema.  Psychiatric: He has a normal mood and affect. His behavior is normal.          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 Await sensisitivities. They were sent out on 7/15 per discussion with [facility].   rtc in 4-6 wk

## 2013-07-26 ENCOUNTER — Other Ambulatory Visit: Payer: Self-pay | Admitting: Licensed Clinical Social Worker

## 2013-07-26 DIAGNOSIS — A310 Pulmonary mycobacterial infection: Secondary | ICD-10-CM

## 2013-07-26 MED ORDER — CLARITHROMYCIN 500 MG PO TABS: 500.0000 mg | ORAL_TABLET | Freq: Every day | ORAL | Status: DC

## 2013-07-27 LAB — AFB CULTURE WITH SMEAR (NOT AT ARMC): ACID FAST SMEAR: NONE SEEN

## 2013-07-28 LAB — AFB CULTURE WITH SMEAR (NOT AT ARMC)
Acid Fast Smear: NONE SEEN
Special Requests: NORMAL

## 2013-07-29 ENCOUNTER — Other Ambulatory Visit: Payer: Self-pay | Admitting: Licensed Clinical Social Worker

## 2013-07-29 DIAGNOSIS — A310 Pulmonary mycobacterial infection: Secondary | ICD-10-CM

## 2013-07-29 MED ORDER — AZITHROMYCIN 500 MG PO TABS: 500.0000 mg | ORAL_TABLET | Freq: Every day | ORAL | Status: DC

## 2013-07-31 LAB — AFB CULTURE WITH SMEAR (NOT AT ARMC)
Acid Fast Smear: NONE SEEN
Special Requests: NORMAL

## 2013-08-05 LAB — AFB CULTURE WITH SMEAR (NOT AT ARMC)
ACID FAST SMEAR: NONE SEEN
Special Requests: NORMAL

## 2013-08-15 DIAGNOSIS — Z2239 Carrier of other specified bacterial diseases: Secondary | ICD-10-CM | POA: Insufficient documentation

## 2013-08-16 ENCOUNTER — Telehealth: Payer: Self-pay | Admitting: Internal Medicine

## 2013-08-16 NOTE — Telephone Encounter (Signed)
Patient is tolerating treatment for mac without difficulty:  Mac susceptibilities returned: Amikacin 32 cipro 8 clairthro S emb S Inh S moxi I rif S

## 2013-08-19 ENCOUNTER — Ambulatory Visit (INDEPENDENT_AMBULATORY_CARE_PROVIDER_SITE_OTHER): Payer: Commercial Managed Care - HMO | Admitting: Internal Medicine

## 2013-08-19 ENCOUNTER — Encounter (INDEPENDENT_AMBULATORY_CARE_PROVIDER_SITE_OTHER): Payer: Self-pay

## 2013-08-19 ENCOUNTER — Ambulatory Visit (INDEPENDENT_AMBULATORY_CARE_PROVIDER_SITE_OTHER)
Admission: RE | Admit: 2013-08-19 | Discharge: 2013-08-19 | Disposition: A | Discharge status: Home / Self Care | Payer: Commercial Managed Care - HMO | Source: Ambulatory Visit | Attending: Internal Medicine | Admitting: Internal Medicine

## 2013-08-19 ENCOUNTER — Encounter: Payer: Self-pay | Admitting: Internal Medicine

## 2013-08-19 VITALS — BP 124/76 | HR 62 | Temp 97.0°F | Ht 70.0 in | Wt 151.0 lb

## 2013-08-19 DIAGNOSIS — J189 Pneumonia, unspecified organism: Secondary | ICD-10-CM

## 2013-08-19 DIAGNOSIS — R634 Abnormal weight loss: Secondary | ICD-10-CM

## 2013-08-19 DIAGNOSIS — J449 Chronic obstructive pulmonary disease, unspecified: Secondary | ICD-10-CM

## 2013-08-19 MED ORDER — BUDESONIDE-FORMOTEROL FUMARATE 160-4.5 MCG/ACT IN AERO: INHALATION_SPRAY | RESPIRATORY_TRACT | Status: DC

## 2013-08-19 NOTE — Assessment & Plan Note (Signed)
-   Spirometry 08/19/2013  FEV1 1.85 ( 47%) ratio 44 - - reports quit smokng 06/2013    The proper method of use, as well as anticipated side effects, of a metered-dose inhaler are discussed and demonstrated to the patient. Improved effectiveness after extensive coaching during this visit to a level of approximately  75% from a baseline of < 25%  May be able to eventually change over to just symbicort prn if maintains off cigs   I reviewed the Flethcher curve with patient that basically indicates  if you quit smoking when your best day FEV1 is still well preserved (as is the case here)  it is highly unlikely you will progress to severe disease and informed the patient there was no medication on the market that has proven to change the curve or the likelihood of progression.  Therefore   maintaining abstinence is the most important aspect of care, not choice of inhalers or for that matter, doctors.  Pulmonary f/u can therefore be prn

## 2013-08-19 NOTE — Assessment & Plan Note (Signed)
cxr looks markedly better, no fob indicated > f/u as planned by Dr Ilsa IhaSnyder

## 2013-08-19 NOTE — Patient Instructions (Addendum)
Continue symbicort 160 Take 2 puffs first thing in am and then another 2 puffs about 12 hours later.   Only use your  atrovent as a rescue medication to be used if you can't catch your breath by resting or doing a relaxed purse lip breathing pattern.  - The less you use it, the better it will work when you need it. - Ok to use up to 2 puffs  every 4 hours if you must but call for immediate appointment if use goes up over your usual need - Don't leave home without it !!  (think of it like the spare tire for your car)   Work on inhaler technique:  relax and gently blow all the way out then take a nice smooth deep breath back in, triggering the inhaler at same time you start breathing in.  Hold for up to 5 seconds if you can.  Rinse and gargle with water when done  Please remember to go to the   x-ray department downstairs for your tests - we will call you with the results when they are available.     Keep follow up appt with Dr Ilsa IhaSnyder and [facility] , pulmonary follow up  is as needed

## 2013-08-19 NOTE — Progress Notes (Signed)
Subjective:    Patient ID: Michael [name], male    DOB: 05/05/1960  MRN: [id number]    Brief patient profile:  53 yowm quit smoking x one week pta:  Admission date: 06/13/2013   Discharge Date: 06/23/2013  Discharge Diagnosis UTI (lower urinary tract infection) [599.0]  Weight loss [783.21]  CAP (community acquired pneumonia) [486]  Hypotension [458.9]  Lung abscess   COPD exacerbation  Active Problems:  Pacemaker - Medtronic 2005, generator change September 2014   Protein-calorie malnutrition, severe     History of Present Illness  07/08/2013 1st [facility] office visit/ Michael [name]  Chief Complaint  Patient presents with  . Pulmonary Consult    Referred after hospital d/c.  He states that he has been having SOB and cough for several months. Was dxed with PNA in June 2015. Cough is non prod and "raspy".   weak for over a year, not breathing well at baseline, better since d/c Still weak p discharge  Raspy cough  L pleuritic cp  rec Plan A = automatic = Symbicort (RED)  160 Take 2 puffs first thing in am and then another 2 puffs about 12 hours later.  Plan B =  Backup = atrovent > add 2 puff every 4 hours if needed  Plan C = Contingency> duoneb up to every 4hours only to be use after you try Plan B first    07/22/2013 f/u ov/Michael [name] re: f/u pna Chief Complaint  Patient presents with  . Follow-up    Pt reports his cough is about the same. Breathing seems some better with no need for atrovent or duoneb.   cp less freq, less severe but still having occ,  but  Denies purulent sputum, hemoptysis. rec Continue symbicort 160 Take 2 puffs first thing in am and then another 2 puffs about 12 hours later.  Work on inhaler technique   08/19/2013 f/u ov/Michael [name] re: LUL pna/ MAI on culture f/u with Michael [name] on rx  Chief Complaint  Patient presents with  . Follow-up    Cough is unchanged. Cough is prod - ? color of mucus.  DOE is unchanged.  Uses Atrovent HFA at times with DOE with relief.   Not limited by breathing from desired activities  - no need for neb at all  No obvious day to day or daytime variabilty or assoc  cp or chest tightness, subjective wheeze overt sinus or hb symptoms. No unusual exp hx or h/o childhood pna/ asthma or knowledge of premature birth.  Sleeping ok without nocturnal  or early am exacerbation  of respiratory  c/o's or need for noct saba. Also denies any obvious fluctuation of symptoms with weather or environmental changes or other aggravating or alleviating factors except as outlined above   Current Medications, Allergies, Complete Past Medical History, Past Surgical History, Family History, and Social History were reviewed in [facility] electronic medical record.  ROS  The following are not active complaints unless bolded sore throat, dysphagia, dental problems, itching, sneezing,  nasal congestion or excess/ purulent secretions, ear ache,   fever, chills, sweats, unintended wt loss, pleuritic or exertional cp, hemoptysis,  orthopnea pnd or leg swelling, presyncope, palpitations, heartburn, abdominal pain, anorexia, nausea, vomiting, diarrhea  or change in bowel or urinary habits, change in stools or urine, dysuria,hematuria,  rash, arthralgias, visual complaints, headache, numbness weakness or ataxia or problems with walking or coordination,  change in mood/affect or memory.          Objective:  Physical Exam  amb wm with unusual affect  07/22/2013       142 > 08/19/2013   151  Wt Readings from Last 3 Encounters:  07/08/13 139 lb 9.6 oz (63.322 kg)  07/03/13 137 lb (62.143 kg)  06/21/13 137 lb 11.2 oz (62.46 kg)      HEENT mild turbinate edema.  Oropharynx no thrush or excess pnd or cobblestoning.  No JVD or cervical adenopathy. Mild accessory muscle hypertrophy. Trachea midline, nl thryroid. Chest was hyperinflated by percussion with diminished breath sounds and moderate increased exp time without wheeze. Hoover sign positive at mid  inspiration. Regular rate and rhythm without murmur gallop or rub or increase P2 or edema.  Abd: no hsm, nl excursion. Ext warm without cyanosis or clubbing.      CXR  07/22/2013 :   Although considerably improved when compared to 06/18/2013, the residual left upper lobe opacity is unchanged when compared to 07/03/2013. This may represent slow to clear pneumonia, but radiographic followup to document complete resolution is suggested.   CXR  08/19/2013 :  Near complete resolution of LUL as dz      Assessment & Plan:

## 2013-08-19 NOTE — Assessment & Plan Note (Signed)
Resolved, turning around nicely

## 2013-08-28 NOTE — Progress Notes (Signed)
Quick Note:  Spoke with pt and notified of results per Dr. Wert. Pt verbalized understanding and denied any questions.  ______ 

## 2013-09-02 ENCOUNTER — Encounter: Payer: Self-pay | Admitting: Internal Medicine

## 2013-09-02 ENCOUNTER — Ambulatory Visit (INDEPENDENT_AMBULATORY_CARE_PROVIDER_SITE_OTHER): Payer: Commercial Managed Care - HMO | Admitting: Internal Medicine

## 2013-09-02 VITALS — BP 132/82 | HR 83 | Temp 98.7°F | Wt 150.0 lb

## 2013-09-02 DIAGNOSIS — A310 Pulmonary mycobacterial infection: Secondary | ICD-10-CM

## 2013-09-02 DIAGNOSIS — R110 Nausea: Secondary | ICD-10-CM

## 2013-09-02 DIAGNOSIS — J438 Other emphysema: Secondary | ICD-10-CM

## 2013-09-02 DIAGNOSIS — J439 Emphysema, unspecified: Secondary | ICD-10-CM

## 2013-09-02 DIAGNOSIS — Z23 Encounter for immunization: Secondary | ICD-10-CM

## 2013-09-02 LAB — COMPLETE METABOLIC PANEL WITH GFR
ALT: 26 U/L (ref 0–53)
AST: 30 U/L (ref 0–37)
Albumin: 4.1 g/dL (ref 3.5–5.2)
Alkaline Phosphatase: 91 U/L (ref 39–117)
BILIRUBIN TOTAL: 0.4 mg/dL (ref 0.2–1.2)
BUN: 12 mg/dL (ref 6–23)
CO2: 29 mEq/L (ref 19–32)
CREATININE: 0.91 mg/dL (ref 0.50–1.35)
Calcium: 9 mg/dL (ref 8.4–10.5)
Chloride: 103 mEq/L (ref 96–112)
GFR, Est African American: >89 mL/min
GFR, Est Non African American: >89 mL/min
Glucose, Bld: 68 mg/dL — ABNORMAL LOW (ref 70–99)
Potassium: 3.6 mEq/L (ref 3.5–5.3)
Sodium: 139 mEq/L (ref 135–145)
Total Protein: 6.5 g/dL (ref 6.0–8.3)

## 2013-09-02 MED ORDER — RIFAMPIN 300 MG PO CAPS: 300.0000 mg | ORAL_CAPSULE | Freq: Two times a day (BID) | ORAL | Status: DC

## 2013-09-02 MED ORDER — ONDANSETRON HCL 8 MG PO TABS: 8.0000 mg | ORAL_TABLET | Freq: Three times a day (TID) | ORAL | Status: DC | PRN

## 2013-09-02 MED ORDER — IPRATROPIUM-ALBUTEROL 0.5-2.5 (3) MG/3ML IN SOLN: 3.0000 mL | RESPIRATORY_TRACT | Status: DC | PRN

## 2013-09-02 NOTE — Progress Notes (Signed)
RVF: pulmnonary MAC Subjective:    Patient ID: Michael [name], male    DOB: Jan 20, 1960, 53 y.o.   MRN: [id number]  HPI Michael [name] is a 53 y.o. 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 Admitted for have lul infiltrate with possible cavitary lesions. 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 On vanco and unasyn. mtb ruled out. He was discharge on 06/23/13 to be on cefepime,flagyl and vancomycin for 2 wks. Then changed  to  augmentin x 2-3 wks. 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 He was initiated on ethambutol, clarithromycin and rifampin in the last 3 wks. He is doing well on this regimen, but only take 2 drugs, did not get rifampin and anti-emetic. He has nausea from meds. He has also seen [name] from pulmonary in the last month who also felt that he was making improvement from his hospitalizaiton. Made adjustment to copd management. Needs refill on inhaler.  Current Outpatient Prescriptions on File Prior to Visit  Medication Sig Dispense Refill  . azithromycin (ZITHROMAX) 500 MG tablet Take 1 tablet (500 mg total) by mouth daily.  30 tablet  5  . budesonide-formoterol (SYMBICORT) 160-4.5 MCG/ACT inhaler Take 2 puffs first thing in am and then another 2 puffs about 12 hours later.  1 Inhaler  11  . carbamazepine (CARBATROL) 200 MG 12 hr capsule Take 200 mg by mouth 2 (two) times daily.      [name] ethambutol (MYAMBUTOL) 400 MG tablet Take 2.5 tablets (1,000 mg total) by mouth daily.  76 tablet  5  . FLUoxetine (PROZAC) 20 MG capsule Take 20 mg by mouth 2 (two) times daily.      [name] ipratropium (ATROVENT HFA) 17 MCG/ACT inhaler Inhale 2 puffs into the lungs every 6 (six) hours as needed.       [name] ipratropium-albuterol (DUONEB) 0.5-2.5 (3) MG/3ML SOLN Take 3 mLs  by nebulization every 4 (four) hours as needed (SOB).  360 mL  1  . naproxen sodium (ANAPROX) 220 MG tablet Take 440 mg by mouth 3 (three) times daily as needed (pain).      [name] omeprazole (PRILOSEC) 20 MG capsule Take 20 mg by mouth 2 (two) times daily.        No current facility-administered medications on file prior to visit.   Active Ambulatory Problems    Diagnosis Date Noted  . Symptomatic bradycardia 09/06/2012  . Pacemaker - Medtronic 2005, generator change September 2014 09/06/2012  . Weight loss 09/14/2012  . CAP (community acquired pneumonia) 06/14/2013  . Protein-calorie malnutrition, severe 06/14/2013  . COPD exacerbation 06/19/2013  . COPD GOLD III  07/08/2013   Resolved Ambulatory Problems    Diagnosis Date Noted  . No Resolved Ambulatory Problems   Past Medical History  Diagnosis Date  . Presence of permanent cardiac pacemaker April 2005  . Bradycardia   . Tobacco abuse   . Chronic chest pain   . Seizure disorder   . History of nuclear stress test 06/14/2011  . COPD (chronic obstructive pulmonary disease)   . Shortness of breath   . Pneumonia   . Anginal pain   . Depression        Review of Systems + nausea, occ heartburn. +  right sided rib pain. No fever or chills    Objective:   Physical Exam BP 132/82  Pulse 83  Temp(Src) 98.7 F (37.1 C) (Oral)  Wt 150 lb (68.04 kg) Physical Exam  Constitutional: He is oriented to person, place, and time. He appears well-developed and well-nourished. No distress.  HENT:  Mouth/Throat: Oropharynx is clear and moist. No oropharyngeal exudate.  Cardiovascular: Normal rate, regular rhythm and normal heart sounds. Exam reveals no gallop and no friction rub.  No murmur heard.  Pulmonary/Chest: Effort normal and breath sounds normal. No respiratory distress. He has no wheezes.  Chest wall= right lower side of manubrium he has tender to palpation. No pleurisy Abdominal: Soft. Bowel sounds are normal. He exhibits no  distension. There is no tenderness.  Lymphadenopathy:  No ant/post cervical adenopathy.mild right sided, non tender, no mobile occipital LAD  Neurological: He is alert and oriented to person, place, and time.  Skin: Skin is warm and dry. No rash noted. No erythema.  Psychiatric: He has a normal mood and affect. behavior is normal.          Assessment & Plan:  Pulmonary mac = will do 3 drug regimen, make sure he has rifampin and antiemetic. Will check cmp today  Copd = will give refillls on inhalers  Health maintenance =will give flu shot  msk pain = can do trial of ibuprofen  rtc in 4 wk

## 2013-10-01 ENCOUNTER — Ambulatory Visit: Payer: Commercial Managed Care - HMO | Admitting: Internal Medicine

## 2013-10-16 ENCOUNTER — Ambulatory Visit: Payer: Commercial Managed Care - HMO | Admitting: Internal Medicine

## 2013-10-18 ENCOUNTER — Telehealth: Payer: Self-pay | Admitting: Cardiovascular Disease

## 2013-10-18 NOTE — Telephone Encounter (Signed)
Closed encounter °

## 2013-10-22 ENCOUNTER — Ambulatory Visit (INDEPENDENT_AMBULATORY_CARE_PROVIDER_SITE_OTHER): Payer: Commercial Managed Care - HMO | Admitting: Cardiovascular Disease

## 2013-10-22 ENCOUNTER — Encounter: Payer: Self-pay | Admitting: Cardiovascular Disease

## 2013-10-22 VITALS — BP 122/77 | HR 83 | Ht 68.0 in | Wt 154.8 lb

## 2013-10-22 DIAGNOSIS — I495 Sick sinus syndrome: Secondary | ICD-10-CM

## 2013-10-22 DIAGNOSIS — I480 Paroxysmal atrial fibrillation: Secondary | ICD-10-CM

## 2013-10-22 DIAGNOSIS — R634 Abnormal weight loss: Secondary | ICD-10-CM

## 2013-10-22 DIAGNOSIS — R001 Bradycardia, unspecified: Secondary | ICD-10-CM

## 2013-10-22 DIAGNOSIS — Z950 Presence of cardiac pacemaker: Secondary | ICD-10-CM

## 2013-10-22 LAB — MDC_IDC_ENUM_SESS_TYPE_INCLINIC
Battery Remaining Longevity: 163 mo
Battery Voltage: 2.79 V
Brady Statistic AP VP Percent: 0 %
Brady Statistic AS VP Percent: 0 %
Lead Channel Pacing Threshold Amplitude: 0.75 V
Lead Channel Pacing Threshold Pulse Width: 0.4 ms
Lead Channel Sensing Intrinsic Amplitude: 15.67 mV
Lead Channel Sensing Intrinsic Amplitude: 2.8 mV
Lead Channel Setting Pacing Amplitude: 1.5 V
Lead Channel Setting Pacing Amplitude: 2 V
Lead Channel Setting Pacing Pulse Width: 0.4 ms
MDC IDC MSMT BATTERY IMPEDANCE: 100 Ohm
MDC IDC MSMT LEADCHNL RA IMPEDANCE VALUE: 395 Ohm
MDC IDC MSMT LEADCHNL RA PACING THRESHOLD AMPLITUDE: 0.75 V
MDC IDC MSMT LEADCHNL RV IMPEDANCE VALUE: 565 Ohm
MDC IDC MSMT LEADCHNL RV PACING THRESHOLD PULSEWIDTH: 0.4 ms
MDC IDC SESS DTM: 20151020130406
MDC IDC SET LEADCHNL RV SENSING SENSITIVITY: 5.6 mV
MDC IDC STAT BRADY AP VS PERCENT: 58 %
MDC IDC STAT BRADY AS VS PERCENT: 41 %

## 2013-10-22 NOTE — Assessment & Plan Note (Signed)
Normal pacemaker function. Remote monitoring every 3 months and yearly visits.

## 2013-10-22 NOTE — Assessment & Plan Note (Signed)
This is a new abnormality. The episodes are extremely brief and asymptomatic. I don't think pharmacological therapy is indicated. I suspect they're related to his chronic lung problems and use of bronchodilators. He should start taking aspirin 81 mg daily. His risk of stroke is low (CHADSVasc score is zero). Anticoagulants are not indicated.

## 2013-10-22 NOTE — Assessment & Plan Note (Signed)
Retrospectively, his atypical mycobacterial infection explains his weight loss.

## 2013-10-22 NOTE — Progress Notes (Signed)
Patient ID: Marylen Pontooy S Keng, male   DOB: 09/22/1960, 53 y.o.   MRN: [id number]     Reason for office visit Pacemaker check  Vincenza HewsShane is here for a routine pacemaker check and has not had any cardiac events since his generator change out in last September. He has chronic shortness of breath attributable to heavy smoking and COPD.  Comprehensive interrogation of his pacemaker shows normal device function. He has 50% atrial pacing and never paces the ventricle. Generator longevity is estimated at greater than 13 years. Heart rate histogram distribution is favorable. He has had 14 episodes of mode switch (overall burden less than 0.1%) at least one of these is consistent with atrial fibrillation with rapid ventricular response. The longest episode was only 4 minutes in duration, on June 10. The most recent episode lasted about 2 minutes on September 7. He is completely unaware of these events and denies palpitations, dizziness, syncope or sudden acute unexplained shortness of breath. He does not have a history of stroke or TIA and does not have hypertension or diabetes mellitus or congestive heart failure. He has normal left ventricular systolic function.   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   Allergies  Allergen Reactions  . Shellfish Allergy Nausea And Vomiting    Current Outpatient Prescriptions  Medication Sig Dispense Refill  . aspirin 81 MG tablet Take 81 mg by mouth daily.      [name]. azithromycin (ZITHROMAX) 500 MG tablet Take 1 tablet (500 mg total) by mouth daily.  30 tablet  5  . budesonide-formoterol (SYMBICORT) 160-4.5 MCG/ACT inhaler Take 2 puffs first thing in am and then another 2 puffs about 12 hours later.  1 Inhaler  11  . carbamazepine (CARBATROL) 200 MG 12 hr capsule Take 200 mg by mouth 2 (two) times daily.      [name]. ethambutol (MYAMBUTOL) 400 MG tablet Take 2.5 tablets (1,000 mg total) by mouth daily.  76 tablet  5  . FLUoxetine (PROZAC) 20 MG  capsule Take 20 mg by mouth 2 (two) times daily.      [name]. ipratropium (ATROVENT HFA) 17 MCG/ACT inhaler Inhale 2 puffs into the lungs every 6 (six) hours as needed.       [name]. ipratropium-albuterol (DUONEB) 0.5-2.5 (3) MG/3ML SOLN Take 3 mLs by nebulization every 4 (four) hours as needed (SOB).  360 mL  1  . naproxen sodium (ANAPROX) 220 MG tablet Take 440 mg by mouth 3 (three) times daily as needed (pain).      [name]. omeprazole (PRILOSEC) 20 MG capsule Take 20 mg by mouth 2 (two) times daily.       . ondansetron (ZOFRAN) 8 MG tablet Take 1 tablet (8 mg total) by mouth every 8 (eight) hours as needed for nausea or vomiting.  30 tablet  6  . rifampin (RIFADIN) 300 MG capsule Take 1 capsule (300 mg total) by mouth 2 (two) times daily.  60 capsule  5   No current facility-administered medications for this visit.    Past Medical History  Diagnosis Date  . 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 Bradycardia   . Tobacco abuse   . Chronic chest pain   . Seizure disorder   . History of nuclear stress test 06/14/2011    lexiscan; mild perfusion defect in basal inferosetpal & apical inferior region; negative for ischemia   . COPD (chronic obstructive pulmonary disease)   . Shortness of  breath   . Pneumonia   . Anginal pain   . Pacemaker   . Depression     Past Surgical History  Procedure Laterality Date  . Pacemaker insertion  04/23/2003    Medtronic Kappa; [facility] - symptomatic bradycardia  . Cardiac catheterization  2002, 2003    normal coronaries  . Transthoracic echocardiogram  05/14/2012    EF 50-55%, normal systolic function; mild MR (ordered for mitral valve disease 424.0)  . Hand surgery  1998    x3  . Eye surgery  [age] y.o.  . Gallbladder surgery  2003    Family History  Problem Relation Age of Onset  . Heart Problems Mother     MVP  . CAD Father   . COPD Father   . Heart disease Father   . Heart Problems Other     aunts x 2  died of heart problems   . Heart attack Maternal Grandmother   . Hypertension Maternal Grandfather   . Heart attack Paternal Grandmother   . Heart attack Paternal Grandfather     History   Social History  . Marital Status: Single    Spouse Name: N/A    Number of Children: 2  . Years of Education: 12   Occupational History  . Disabled    Social History Main Topics  . Smoking status: Former Smoker -- 1.00 packs/day for 40 years    Types: Cigarettes    Quit date: 06/06/2013  . Smokeless tobacco: Never Used  . Alcohol Use: No  . Drug Use: No  . Sexual Activity: Not Currently   Other Topics Concern  . Not on file   Social History Narrative  . No narrative on file    Review of systems: Chronic shortness of breath, functional class II-III, daily cough and wheezing. No recent fever, chills, hemoptysis or excessive sputum. The patient specifically denies any chest pain at rest or with exertion, dyspnea at rest or with exertion, orthopnea, paroxysmal nocturnal dyspnea, syncope, palpitations, focal neurological deficits, intermittent claudication, lower extremity edema, unexplained weight gain.  The patient also denies abdominal pain, nausea, vomiting, dysphagia, diarrhea, constipation, polyuria, polydipsia, dysuria, hematuria, frequency, urgency, abnormal bleeding or bruising, fever, chills, unexpected weight changes, mood swings, change in skin or hair texture, change in voice quality, auditory or visual problems, allergic reactions or rashes, new musculoskeletal complaints other than usual "aches and pains".   PHYSICAL EXAM BP 122/77  Pulse 83  Ht 5\' 8"  (1.727 m)  Wt 70.217 kg (154 lb 12.8 oz)  BMI 23.54 kg/m2 General: Alert, oriented x3, no distress, under nourished  Head: no evidence of trauma, PERRL, EOMI, no exophtalmos or lid lag, no myxedema, no xanthelasma; normal ears, nose and oropharynx  Neck: normal jugular venous pulsations and no hepatojugular reflux; brisk  carotid pulses without delay and no carotid bruits  Chest: clear to auscultation, no signs of consolidation by percussion or palpation, normal fremitus, symmetrical and full respiratory excursions; well-healed pacemaker scar  Cardiovascular: normal position and quality of the apical impulse, regular rhythm, normal first and second heart sounds, no murmurs, rubs or gallops  Abdomen: no tenderness or distention, no masses by palpation, no abnormal pulsatility or arterial bruits, normal bowel sounds, no hepatosplenomegaly  Extremities: no clubbing, cyanosis or edema; 2+ radial, ulnar and brachial pulses bilaterally; 2+ right femoral, posterior tibial and dorsalis pedis pulses; 2+ left femoral, posterior tibial and dorsalis pedis pulses; no subclavian or femoral bruits  Neurological: grossly nonfocal  EKG: Atrial paced, ventricular size, otherwise normal  Lipid Panel     Component Value Date/Time   CHOL 153 09/04/2012 0839   TRIG 96 09/04/2012 0839   HDL 45 09/04/2012 0839   CHOLHDL 3.4 09/04/2012 0839   VLDL 19 09/04/2012 0839   LDLCALC 89 09/04/2012 0839    BMET    Component Value Date/Time   NA 139 09/02/2013 1341   K 3.6 09/02/2013 1341   CL 103 09/02/2013 1341   CO2 29 09/02/2013 1341   GLUCOSE 68* 09/02/2013 1341   BUN 12 09/02/2013 1341   CREATININE 0.91 09/02/2013 1341   CREATININE 0.83 06/24/2013 0900   CALCIUM 9.0 09/02/2013 1341   GFRNONAA >89 09/02/2013 1341   GFRNONAA >90 06/24/2013 0900   GFRAA >89 09/02/2013 1341   GFRAA >90 06/24/2013 0900     ASSESSMENT AND PLAN Paroxysmal atrial fibrillation This is a new abnormality. The episodes are extremely brief and asymptomatic. I don't think pharmacological therapy is indicated. I suspect they're related to his chronic lung problems and use of bronchodilators. He should start taking aspirin 81 mg daily. His risk of stroke is low (CHADSVasc score is zero). Anticoagulants are not indicated.  Pacemaker - Medtronic 2005, generator change  September 2014 Normal pacemaker function. Remote monitoring every 3 months and yearly visits.  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   Orders Placed This Encounter  Procedures  . Implantable device check   Meds ordered this encounter  Medications  . aspirin 81 MG tablet    Sig: Take 81 mg by mouth daily.    Junious SilkROITORU,Bodie Abernethy  Cobi Aldape, MD, [facility] [phone] office [phone] pager

## 2013-10-22 NOTE — Patient Instructions (Addendum)
START aspirin 81mg  once daily  Remote monitoring is used to monitor your Pacemaker of ICD from home. This monitoring reduces the number of office visits required to check your device to one time per year. It allows us to keep an eye on the functioning of your device to ensure it is working properly. You are scheduled for a device check from home on January 20th, 2016. You may send your transmission at any time that day. If you have a wireless device, the transmission will be sent automatically. After your physician reviews your transmission, you will receive a postcard with your next transmission date.  Your physician recommends that you schedule a follow-up appointment in: 6 months with Dr.Croitoru + pacemaker

## 2013-10-23 ENCOUNTER — Encounter: Payer: Commercial Managed Care - HMO | Admitting: Cardiovascular Disease

## 2013-10-25 ENCOUNTER — Encounter: Payer: Self-pay | Admitting: Cardiovascular Disease

## 2013-11-14 ENCOUNTER — Encounter: Payer: Self-pay | Admitting: Internal Medicine

## 2013-11-14 ENCOUNTER — Ambulatory Visit (INDEPENDENT_AMBULATORY_CARE_PROVIDER_SITE_OTHER): Payer: Commercial Managed Care - HMO | Admitting: Internal Medicine

## 2013-11-14 VITALS — BP 120/76 | HR 76 | Temp 98.2°F | Wt 148.0 lb

## 2013-11-14 DIAGNOSIS — A310 Pulmonary mycobacterial infection: Secondary | ICD-10-CM

## 2013-11-14 DIAGNOSIS — R110 Nausea: Secondary | ICD-10-CM

## 2013-11-14 LAB — COMPLETE METABOLIC PANEL WITH GFR
ALT: 15 U/L (ref 0–53)
AST: 21 U/L (ref 0–37)
Albumin: 4.1 g/dL (ref 3.5–5.2)
Alkaline Phosphatase: 87 U/L (ref 39–117)
BILIRUBIN TOTAL: 0.3 mg/dL (ref 0.2–1.2)
BUN: 17 mg/dL (ref 6–23)
CO2: 29 mEq/L (ref 19–32)
CREATININE: 0.96 mg/dL (ref 0.50–1.35)
Calcium: 9.4 mg/dL (ref 8.4–10.5)
Chloride: 101 mEq/L (ref 96–112)
GFR, Est African American: >89 mL/min
GFR, Est Non African American: >89 mL/min
Glucose, Bld: 101 mg/dL — ABNORMAL HIGH (ref 70–99)
Potassium: 4.1 mEq/L (ref 3.5–5.3)
SODIUM: 138 meq/L (ref 135–145)
Total Protein: 6.6 g/dL (ref 6.0–8.3)

## 2013-11-14 LAB — CBC WITH DIFFERENTIAL/PLATELET
BASOS ABS: 0 10*3/uL (ref 0.0–0.1)
BASOS PCT: 0 % (ref 0–1)
EOS ABS: 0.2 10*3/uL (ref 0.0–0.7)
Eosinophils Relative: 3 % (ref 0–5)
HCT: 38.4 % — ABNORMAL LOW (ref 39.0–52.0)
Hemoglobin: 13.7 g/dL (ref 13.0–17.0)
LYMPHS ABS: 1.6 10*3/uL (ref 0.7–4.0)
Lymphocytes Relative: 20 % (ref 12–46)
MCH: 32.7 pg (ref 26.0–34.0)
MCHC: 35.7 g/dL (ref 30.0–36.0)
MCV: 91.6 fL (ref 78.0–100.0)
Monocytes Absolute: 0.5 10*3/uL (ref 0.1–1.0)
Monocytes Relative: 6 % (ref 3–12)
NEUTROS PCT: 71 % (ref 43–77)
Neutro Abs: 5.8 10*3/uL (ref 1.7–7.7)
Platelets: 260 10*3/uL (ref 150–400)
RBC: 4.19 MIL/uL — AB (ref 4.22–5.81)
RDW: 13 % (ref 11.5–15.5)
WBC: 8.2 10*3/uL (ref 4.0–10.5)

## 2013-11-14 MED ORDER — ISONIAZID 300 MG PO TABS: 300.0000 mg | ORAL_TABLET | Freq: Every day | ORAL | Status: DC

## 2013-11-14 MED ORDER — ONDANSETRON 8 MG PO TBDP: 8.0000 mg | ORAL_TABLET | Freq: Three times a day (TID) | ORAL | Status: DC | PRN

## 2013-11-14 MED ORDER — VITAMIN B-6 25 MG PO TABS: 25.0000 mg | ORAL_TABLET | Freq: Every day | ORAL | Status: DC

## 2013-11-14 NOTE — Progress Notes (Signed)
Subjective:    Patient ID: Michael [name], male    DOB: 04/20/1960, 53 y.o.   MRN: [id number]  HPI  Michael [name] is a 53 y.o. 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 Admitted for have lul infiltrate with possible cavitary lesions. 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 On vanco and unasyn. mtb ruled out. He was discharge on 06/23/13 to be on cefepime,flagyl and vancomycin for 2 wks. Then changed to augmentin which he continued until early August. 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 He now reports that he has significant nausea and vomiting, presumably from rifampin. He is here at hte appointment with his mother who cares for him   Current Outpatient Prescriptions on File Prior to Visit  Medication Sig Dispense Refill  . aspirin 81 MG tablet Take 81 mg by mouth daily.    [name]. azithromycin (ZITHROMAX) 500 MG tablet Take 1 tablet (500 mg total) by mouth daily. 30 tablet 5  . budesonide-formoterol (SYMBICORT) 160-4.5 MCG/ACT inhaler Take 2 puffs first thing in am and then another 2 puffs about 12 hours later. 1 Inhaler 11  . carbamazepine (CARBATROL) 200 MG 12 hr capsule Take 200 mg by mouth 2 (two) times daily.    [name]. ethambutol (MYAMBUTOL) 400 MG tablet Take 2.5 tablets (1,000 mg total) by mouth daily. 76 tablet 5  . FLUoxetine (PROZAC) 20 MG capsule Take 20 mg by mouth 2 (two) times daily.    [name]. ipratropium (ATROVENT HFA) 17 MCG/ACT inhaler Inhale 2 puffs into the lungs every 6 (six) hours as needed.     [name]. ipratropium-albuterol (DUONEB) 0.5-2.5 (3) MG/3ML SOLN Take 3 mLs by nebulization every 4 (four) hours as needed (SOB). 360 mL 1  . naproxen sodium (ANAPROX) 220 MG tablet Take 440 mg by mouth 3 (three) times daily as needed (pain).    [name].  omeprazole (PRILOSEC) 20 MG capsule Take 20 mg by mouth 2 (two) times daily.     . ondansetron (ZOFRAN) 8 MG tablet Take 1 tablet (8 mg total) by mouth every 8 (eight) hours as needed for nausea or vomiting. 30 tablet 6  . rifampin (RIFADIN) 300 MG capsule Take 1 capsule (300 mg total) by mouth 2 (two) times daily. 60 capsule 5   No current facility-administered medications on file prior to visit.   Active Ambulatory Problems    Diagnosis Date Noted  . Symptomatic bradycardia 09/06/2012  . Pacemaker - Medtronic 2005, generator change September 2014 09/06/2012  . Weight loss 09/14/2012  . CAP (community acquired pneumonia) 06/14/2013  . Protein-calorie malnutrition, severe 06/14/2013  . COPD exacerbation 06/19/2013  . COPD GOLD III  07/08/2013  . Paroxysmal atrial fibrillation 10/22/2013  . Sinus node dysfunction 10/22/2013   Resolved Ambulatory Problems    Diagnosis Date Noted  . No Resolved Ambulatory Problems   Past Medical History  Diagnosis Date  . Presence of permanent cardiac pacemaker April 2005  . Bradycardia   . Tobacco abuse   . Chronic chest pain   . Seizure disorder   . History of nuclear stress test 06/14/2011  . COPD (chronic obstructive pulmonary disease)   . Shortness of breath   . Pneumonia   . Anginal pain   . Depression  Review of Systems + reflux, + nausea, vomiting after taking medications. Poor appetite no fever, chills, cough.    Objective:   Physical Exam BP 120/76 mmHg  Pulse 76  Temp(Src) 98.2 F (36.8 C) (Oral)  Wt 148 lb (67.132 kg) Physical Exam  Constitutional: He is oriented to person, place, and time. He appears well-developed and well-nourished. No distress. Thin, chronically ill male HENT:  Mouth/Throat: Oropharynx is clear and moist. No oropharyngeal exudate.  Cardiovascular: Normal rate, regular rhythm and normal heart sounds. Exam reveals no gallop and no friction rub.  No murmur heard.  Pulmonary/Chest: Effort normal and  breath sounds normal. No respiratory distress. He has no wheezes.  Abdominal: Soft. Bowel sounds are normal. He exhibits no distension. There is no tenderness.  Lymphadenopathy:  He has no cervical adenopathy.  Neurological: He is alert and oriented to person, place, and time.  Skin: Skin is warm and dry. No rash noted. No erythema.  Psychiatric: He has a normal mood and affect. His behavior is normal.     Lab Results  Component Value Date   WBC 8.2 11/14/2013   HGB 13.7 11/14/2013   HCT 38.4* 11/14/2013   MCV 91.6 11/14/2013   PLT 260 11/14/2013   CMP     Component Value Date/Time   NA 138 11/14/2013 1647   K 4.1 11/14/2013 1647   CL 101 11/14/2013 1647   CO2 29 11/14/2013 1647   GLUCOSE 101* 11/14/2013 1647   BUN 17 11/14/2013 1647   CREATININE 0.96 11/14/2013 1647   CREATININE 0.83 06/24/2013 0900   CALCIUM 9.4 11/14/2013 1647   PROT 6.6 11/14/2013 1647   ALBUMIN 4.1 11/14/2013 1647   AST 21 11/14/2013 1647   ALT 15 11/14/2013 1647   ALKPHOS 87 11/14/2013 1647   BILITOT 0.3 11/14/2013 1647   GFRNONAA >89 11/14/2013 1647   GFRNONAA >90 06/24/2013 0900   GFRAA >89 11/14/2013 1647   GFRAA >90 06/24/2013 0900        Assessment & Plan:  Pulmonary mac = Will switch to emb, azithro, inh plus vitamin b6. Will d/c rifampin. Ask him to take on full stomach. Currently on 3-4th month of treatment. Attempt to continue to take regimen, aim for 6-9 months. Will check cbc and cmp today  Nausea= refill on zofran  rtc in 2 month

## 2013-12-12 ENCOUNTER — Encounter (HOSPITAL_COMMUNITY): Payer: Self-pay | Admitting: Cardiovascular Disease

## 2014-01-14 ENCOUNTER — Ambulatory Visit (HOSPITAL_COMMUNITY)
Admission: RE | Admit: 2014-01-14 | Discharge: 2014-01-14 | Disposition: A | Discharge status: Home / Self Care | Payer: Commercial Managed Care - HMO | Source: Ambulatory Visit | Attending: Internal Medicine | Admitting: Internal Medicine

## 2014-01-14 ENCOUNTER — Encounter: Payer: Self-pay | Admitting: Internal Medicine

## 2014-01-14 ENCOUNTER — Ambulatory Visit (INDEPENDENT_AMBULATORY_CARE_PROVIDER_SITE_OTHER): Payer: Commercial Managed Care - HMO | Admitting: Internal Medicine

## 2014-01-14 VITALS — BP 120/78 | HR 88 | Temp 97.1°F | Wt 151.0 lb

## 2014-01-14 DIAGNOSIS — Z87891 Personal history of nicotine dependence: Secondary | ICD-10-CM | POA: Insufficient documentation

## 2014-01-14 DIAGNOSIS — A312 Disseminated mycobacterium avium-intracellulare complex (DMAC): Secondary | ICD-10-CM | POA: Diagnosis present

## 2014-01-14 DIAGNOSIS — A310 Pulmonary mycobacterial infection: Secondary | ICD-10-CM

## 2014-01-14 DIAGNOSIS — J449 Chronic obstructive pulmonary disease, unspecified: Secondary | ICD-10-CM | POA: Diagnosis not present

## 2014-01-14 NOTE — Progress Notes (Signed)
Patient ID: Michael [name], male   DOB: Sep 17, 1960, 54 y.o.   MRN: [id number]       Patient ID: Michael [name], male   DOB: [birth year], 54 y.o.   MRN: [id number]  HPI 54yo M with COPD, being treated for MAC pulmonary infection for the past 4 months He is tolerating medicaitons with taking anti nausea meds, but tired of pill burdent. He had teeth extraction without novocaine. Denies weight loss. No fever or chills, cough improved.  Outpatient Encounter Prescriptions as of 01/14/2014  Medication Sig  . aspirin 81 MG tablet Take 81 mg by mouth daily.  [name] azithromycin (ZITHROMAX) 500 MG tablet Take 1 tablet (500 mg total) by mouth daily.  . budesonide-formoterol (SYMBICORT) 160-4.5 MCG/ACT inhaler Take 2 puffs first thing in am and then another 2 puffs about 12 hours later.  . carbamazepine (CARBATROL) 200 MG 12 hr capsule Take 200 mg by mouth 2 (two) times daily.  [name] ethambutol (MYAMBUTOL) 400 MG tablet Take 2.5 tablets (1,000 mg total) by mouth daily.  [name] FLUoxetine (PROZAC) 20 MG capsule Take 20 mg by mouth 2 (two) times daily.  [name] ipratropium (ATROVENT HFA) 17 MCG/ACT inhaler Inhale 2 puffs into the lungs every 6 (six) hours as needed.   [name] ipratropium-albuterol (DUONEB) 0.5-2.5 (3) MG/3ML SOLN Take 3 mLs by nebulization every 4 (four) hours as needed (SOB).  [name] isoniazid (NYDRAZID) 300 MG tablet Take 1 tablet (300 mg total) by mouth daily.  . naproxen sodium (ANAPROX) 220 MG tablet Take 440 mg by mouth 3 (three) times daily as needed (pain).  [name] omeprazole (PRILOSEC) 20 MG capsule Take 20 mg by mouth 2 (two) times daily.   . ondansetron (ZOFRAN ODT) 8 MG disintegrating tablet Take 1 tablet (8 mg total) by mouth every 8 (eight) hours as needed for nausea or vomiting.  . vitamin B-6 (PYRIDOXINE) 25 MG tablet Take 1 tablet (25 mg total) by mouth daily.     Patient Active Problem List   Diagnosis Date Noted  . Paroxysmal atrial fibrillation 10/22/2013  . Sinus node dysfunction 10/22/2013  . COPD GOLD III   07/08/2013  . COPD exacerbation 06/19/2013  . CAP (community acquired pneumonia) 06/14/2013  . Protein-calorie malnutrition, severe 06/14/2013  . Weight loss 09/14/2012  . Symptomatic bradycardia 09/06/2012  . 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   Review of Systems  Physical Exam   BP 120/78 mmHg  Pulse 88  Temp(Src) 97.1 F (36.2 C) (Oral)  Wt 151 lb (68.493 kg)  No results found for: CD4TCELL No results found for: CD4TABS No results found for: HIV1RNAQUANT No results found for: HEPBSAB No results found for: RPR  CBC Lab Results  Component Value Date   WBC 8.2 11/14/2013   RBC 4.19* 11/14/2013   HGB 13.7 11/14/2013   HCT 38.4* 11/14/2013   PLT 260 11/14/2013   MCV 91.6 11/14/2013   MCH 32.7 11/14/2013   MCHC 35.7 11/14/2013   RDW 13.0 11/14/2013   LYMPHSABS 1.6 11/14/2013   MONOABS 0.5 11/14/2013   EOSABS 0.2 11/14/2013   BASOSABS 0.0 11/14/2013   BMET Lab Results  Component Value Date   NA 138 11/14/2013   K 4.1 11/14/2013   CL 101 11/14/2013   CO2 29 11/14/2013   GLUCOSE 101* 11/14/2013   BUN 17 11/14/2013   CREATININE 0.96 11/14/2013   CALCIUM 9.4 11/14/2013   GFRNONAA >  89 11/14/2013   GFRAA >89 11/14/2013     Assessment and Plan  Pulmonary mac = continue on azithromycin, EMB, rifampin. Will need to check how meds were changed from rif to INH + vitamin B6. Possibly due to drug interaction with anti epileptics. He has been taking meds for 4 months. Will change to TIW dosing  Latent tb = continue with current regimen if there is drug interaction with rifampin  Nausea = continue with anti-emetics prior to taking medication  Right sided rib pain = possibly pleuritic by description. We will get repeat cxr  rtc in 2 months  [phone].mom

## 2014-01-15 ENCOUNTER — Other Ambulatory Visit: Payer: Self-pay | Admitting: Internal Medicine

## 2014-01-15 ENCOUNTER — Encounter: Payer: Self-pay | Admitting: *Deleted

## 2014-01-15 DIAGNOSIS — A310 Pulmonary mycobacterial infection: Secondary | ICD-10-CM

## 2014-01-23 ENCOUNTER — Encounter: Payer: Commercial Managed Care - HMO | Admitting: *Deleted

## 2014-01-23 ENCOUNTER — Telehealth: Payer: Self-pay | Admitting: Cardiology

## 2014-01-23 NOTE — Telephone Encounter (Signed)
Spoke with pt and reminded pt of remote transmission that is due today. Pt verbalized understanding.   

## 2014-01-27 ENCOUNTER — Encounter: Payer: Self-pay | Admitting: Cardiology

## 2014-02-07 ENCOUNTER — Other Ambulatory Visit: Payer: Self-pay | Admitting: Licensed Clinical Social Worker

## 2014-02-07 DIAGNOSIS — R110 Nausea: Secondary | ICD-10-CM

## 2014-02-07 MED ORDER — ONDANSETRON 8 MG PO TBDP: 8.0000 mg | ORAL_TABLET | Freq: Three times a day (TID) | ORAL | Status: DC | PRN

## 2014-03-06 ENCOUNTER — Other Ambulatory Visit: Payer: Self-pay | Admitting: Internal Medicine

## 2014-03-20 ENCOUNTER — Ambulatory Visit (INDEPENDENT_AMBULATORY_CARE_PROVIDER_SITE_OTHER): Payer: Medicare HMO | Admitting: Internal Medicine

## 2014-03-20 ENCOUNTER — Encounter: Payer: Self-pay | Admitting: Internal Medicine

## 2014-03-20 VITALS — BP 118/73 | HR 78 | Temp 97.2°F | Wt 162.0 lb

## 2014-03-20 DIAGNOSIS — A310 Pulmonary mycobacterial infection: Secondary | ICD-10-CM

## 2014-03-20 DIAGNOSIS — Z0000 Encounter for general adult medical examination without abnormal findings: Secondary | ICD-10-CM

## 2014-03-20 LAB — CBC WITH DIFFERENTIAL/PLATELET
Basophils Absolute: 0.1 10*3/uL (ref 0.0–0.1)
Basophils Relative: 1 % (ref 0–1)
EOS ABS: 0.5 10*3/uL (ref 0.0–0.7)
Eosinophils Relative: 10 % — ABNORMAL HIGH (ref 0–5)
HCT: 39 % (ref 39.0–52.0)
Hemoglobin: 13.5 g/dL (ref 13.0–17.0)
LYMPHS ABS: 0.8 10*3/uL (ref 0.7–4.0)
Lymphocytes Relative: 16 % (ref 12–46)
MCH: 33.5 pg (ref 26.0–34.0)
MCHC: 34.6 g/dL (ref 30.0–36.0)
MCV: 96.8 fL (ref 78.0–100.0)
MPV: 9.7 fL (ref 8.6–12.4)
Monocytes Absolute: 0.4 10*3/uL (ref 0.1–1.0)
Monocytes Relative: 8 % (ref 3–12)
NEUTROS ABS: 3.4 10*3/uL (ref 1.7–7.7)
Neutrophils Relative %: 65 % (ref 43–77)
Platelets: 255 10*3/uL (ref 150–400)
RBC: 4.03 MIL/uL — ABNORMAL LOW (ref 4.22–5.81)
RDW: 12.2 % (ref 11.5–15.5)
WBC: 5.3 10*3/uL (ref 4.0–10.5)

## 2014-03-20 LAB — COMPLETE METABOLIC PANEL WITH GFR
ALBUMIN: 4 g/dL (ref 3.5–5.2)
ALT: 18 U/L (ref 0–53)
AST: 28 U/L (ref 0–37)
Alkaline Phosphatase: 99 U/L (ref 39–117)
BUN: 15 mg/dL (ref 6–23)
CALCIUM: 9.1 mg/dL (ref 8.4–10.5)
CO2: 28 meq/L (ref 19–32)
Chloride: 104 mEq/L (ref 96–112)
Creat: 0.95 mg/dL (ref 0.50–1.35)
GFR, Est African American: >89 mL/min
GFR, Est Non African American: >89 mL/min
Glucose, Bld: 58 mg/dL — ABNORMAL LOW (ref 70–99)
POTASSIUM: 4.1 meq/L (ref 3.5–5.3)
SODIUM: 140 meq/L (ref 135–145)
TOTAL PROTEIN: 6.2 g/dL (ref 6.0–8.3)
Total Bilirubin: 0.3 mg/dL (ref 0.2–1.2)

## 2014-03-20 LAB — HEPATITIS C ANTIBODY: HCV Ab: NEGATIVE

## 2014-03-21 ENCOUNTER — Other Ambulatory Visit: Payer: Self-pay | Admitting: Internal Medicine

## 2014-03-21 DIAGNOSIS — A319 Mycobacterial infection, unspecified: Secondary | ICD-10-CM

## 2014-04-21 NOTE — Progress Notes (Signed)
Subjective:    Patient ID: Michael [name], male    DOB: 10/11/1960, 54 y.o.   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 He states that he continues to be adherent with medication regimen .still using anti-emetics for meds.  Current Outpatient Prescriptions on File Prior to Visit  Medication Sig Dispense Refill  . aspirin 81 MG tablet Take 81 mg by mouth daily.    [name] azithromycin (ZITHROMAX) 500 MG tablet TAKE ONE TABLET BY MOUTH ONCE DAILY 30 tablet 5  . budesonide-formoterol (SYMBICORT) 160-4.5 MCG/ACT inhaler Take 2 puffs first thing in am and then another 2 puffs about 12 hours later. 1 Inhaler 11  . carbamazepine (CARBATROL) 200 MG 12 hr capsule Take 200 mg by mouth 2 (two) times daily.    [name] ethambutol (MYAMBUTOL) 400 MG tablet TAKE 2 & 1/2 TABLETS BY MOUTH ONCE DAILY 76 tablet 5  . FLUoxetine (PROZAC) 20 MG capsule Take 20 mg by mouth 2 (two) times daily.    [name] ipratropium (ATROVENT HFA) 17 MCG/ACT inhaler Inhale 2 puffs into the lungs every 6 (six) hours as needed.     . isoniazid (NYDRAZID) 300 MG tablet Take 1 tablet (300 mg total) by mouth daily. 30 tablet 6  . omeprazole (PRILOSEC) 20 MG capsule Take 20 mg by mouth 2 (two) times daily.     . ondansetron (ZOFRAN ODT) 8 MG disintegrating tablet Take 1 tablet (8 mg total) by mouth every 8 (eight) hours as needed for nausea or vomiting. 60 tablet 0  . vitamin B-6 (PYRIDOXINE) 25 MG tablet Take 1 tablet (25 mg total) by mouth daily. 100 tablet 3  . ipratropium-albuterol (DUONEB) 0.5-2.5 (3) MG/3ML SOLN Take 3 mLs by nebulization every 4 (four) hours as needed (SOB). (Patient not taking: Reported on 03/20/2014) 360 mL 1  . naproxen sodium (ANAPROX) 220 MG tablet Take 440 mg by mouth 3 (three) times daily as needed (pain).     No current facility-administered medications on file prior to visit.   Active Ambulatory Problems    Diagnosis Date Noted  . Symptomatic bradycardia 09/06/2012  . Pacemaker -  Medtronic 2005, generator change September 2014 09/06/2012  . Weight loss 09/14/2012  . CAP (community acquired pneumonia) 06/14/2013  . Protein-calorie malnutrition, severe 06/14/2013  . COPD exacerbation 06/19/2013  . COPD GOLD III  07/08/2013  . Paroxysmal atrial fibrillation 10/22/2013  . Sinus node dysfunction 10/22/2013  . MAC (mycobacterium avium-intracellulare complex) 08/15/2013   Resolved Ambulatory Problems    Diagnosis Date Noted  . No Resolved Ambulatory Problems   Past Medical History  Diagnosis Date  . Presence of permanent cardiac pacemaker April 2005  . Bradycardia   . Tobacco abuse   . Chronic chest pain   . Seizure disorder   . History of nuclear stress test 06/14/2011  . COPD (chronic obstructive pulmonary disease)   . Shortness of breath   . Pneumonia   . Anginal pain   . Depression       Review of Systems     Objective:   Physical Exam BP 118/73 mmHg  Pulse 78  Temp(Src) 97.2 F (36.2 C) (Oral)  Wt 162 lb (73.483 kg) Physical Exam  Constitutional: He is oriented to person, place, and time. He appears well-developed and thin stature. No distress.  HENT: poor dentition Mouth/Throat: Oropharynx is clear and moist. No oropharyngeal exudate.  Cardiovascular: Normal rate, regular rhythm and normal heart sounds. Exam  reveals no gallop and no friction rub.  No murmur heard.  Pulmonary/Chest: Effort normal and breath sounds normal. No respiratory distress. Mild exp wheeze Lymphadenopathy:  He has no cervical adenopathy.  Skin: Skin is warm and dry. No rash noted. No erythema.  Psychiatric: He has a normal mood and affect. His behavior is normal.     Lab Results  Component Value Date   WBC 5.3 03/20/2014   HGB 13.5 03/20/2014   HCT 39.0 03/20/2014   MCV 96.8 03/20/2014   PLT 255 03/20/2014   CMP Latest Ref Rng 03/20/2014 11/14/2013 09/02/2013  Glucose 70 - 99 mg/dL 16(X58(L) 096(E101(H) 45(W68(L)  BUN 6 - 23 mg/dL 15 17 12   Creatinine 0.50 - 1.35 mg/dL  0.980.95 1.190.96 1.470.91  Sodium 135 - 145 mEq/L 140 138 139  Potassium 3.5 - 5.3 mEq/L 4.1 4.1 3.6  Chloride 96 - 112 mEq/L 104 101 103  CO2 19 - 32 mEq/L 28 29 29   Calcium 8.4 - 10.5 mg/dL 9.1 9.4 9.0  Total Protein 6.0 - 8.3 g/dL 6.2 6.6 6.5  Total Bilirubin 0.2 - 1.2 mg/dL 0.3 0.3 0.4  Alkaline Phos 39 - 117 U/L 99 87 91  AST 0 - 37 U/L 28 21 30   ALT 0 - 53 U/L 18 15 26         Assessment & Plan:  Pulmonary mac = continue on current regimen. Will plan on repeat sputum specimen to ensure he has cleared his cultures  Latent TB = continue on INH and vit B6  Sever protein caloric malnutrition = appears that he is gaining weight, continue with increase nutritional intake   Nausea = assoc with meds, continue with prn anti emetics  Copd = no current exacerbations

## 2014-05-06 ENCOUNTER — Encounter: Payer: Self-pay | Admitting: *Deleted

## 2014-05-20 ENCOUNTER — Ambulatory Visit: Payer: Medicare HMO | Admitting: Internal Medicine

## 2014-07-28 ENCOUNTER — Other Ambulatory Visit: Payer: Self-pay | Admitting: Internal Medicine

## 2014-08-19 ENCOUNTER — Encounter: Payer: Medicare HMO | Admitting: Cardiovascular Disease

## 2014-09-22 ENCOUNTER — Encounter: Payer: Self-pay | Admitting: Cardiovascular Disease

## 2015-01-16 ENCOUNTER — Encounter: Payer: Self-pay | Admitting: *Deleted

## 2015-03-10 ENCOUNTER — Ambulatory Visit (INDEPENDENT_AMBULATORY_CARE_PROVIDER_SITE_OTHER): Payer: Medicare HMO | Admitting: Cardiovascular Disease

## 2015-03-10 ENCOUNTER — Encounter: Payer: Self-pay | Admitting: Cardiovascular Disease

## 2015-03-10 VITALS — BP 130/86 | HR 68 | Ht 69.0 in | Wt 156.1 lb

## 2015-03-10 DIAGNOSIS — I480 Paroxysmal atrial fibrillation: Secondary | ICD-10-CM | POA: Diagnosis not present

## 2015-03-10 DIAGNOSIS — Z950 Presence of cardiac pacemaker: Secondary | ICD-10-CM

## 2015-03-10 DIAGNOSIS — I495 Sick sinus syndrome: Secondary | ICD-10-CM | POA: Diagnosis not present

## 2015-03-10 NOTE — Progress Notes (Signed)
Patient ID: Michael [name], male   DOB: [birth year], 55 y.o.   MRN: [id number]     Cardiology Office Note    Date:  03/11/2015   ID:  Michael [name], DOB Jul 08, 1960, MRN [id number]  PCP:  Egbert Garibaldi, NP  Cardiologist:   Thurmon Fair, MD   Chief Complaint  Patient presents with  . Follow-up    occassional chest pain, occassional shortness of breath, no edema,  occassional pain or cramping in legs, rarely has lightheadedness or dizziness, has fatigue    History of Present Illness:  Michael [name] is a 55 y.o. male with symptomatic bradycardia due to sinus node dysfunction, returning for a pacemaker check. As before, his compliance with follow-up and medications is poor. He has stopped taking his medications for Mycobacterium avium lung infection and has not seen the infectious disease specialist in 12 months.  This is his first pacemaker check since October 2015. He has not been doing remote downloads, due to some type of transmission problem.  Device interrogation shows normal function. The current generator is a Medtronic Adapta implanted as a generator change out in 2014. Battery voltage suggests another 12 years of longevity. There is 70% atrial pacing and only 0.2% ventricular pacing. Very rare episodes of atrial fibrillation have been recorded. 1 minute was recorded in April 2016. A 9 minute episode occurred on 02/11/2015. He has more frequent episodes of generally brief regular atrial tachycardia, unclear if it is sinus tachycardia or ectopic atrial tachycardia. The longest recorded event was just under 5 minutes in duration. None of these episodes have been symptomatic.  Denies shortness of breath or coughing or hemoptysis. He is no longer smoking. He also denies chest pain, palpitations, syncope, leg edema, claudication or other cardiovascular symptoms. He has not had fever or chills.   Past Medical History  Diagnosis Date  . Presence of permanent cardiac pacemaker April 2005    For  bradycardia  . Bradycardia   . Tobacco abuse   . Chronic chest pain   . Seizure disorder (HCC)   . History of nuclear stress test 06/14/2011    lexiscan; mild perfusion defect in basal inferosetpal & apical inferior region; negative for ischemia   . COPD (chronic obstructive pulmonary disease) (HCC)   . Shortness of breath   . Pneumonia   . Anginal pain (HCC)   . Pacemaker   . Depression     Past Surgical History  Procedure Laterality Date  . Pacemaker insertion  04/23/2003    Medtronic Kappa; [facility] - symptomatic bradycardia  . Cardiac catheterization  2002, 2003    normal coronaries  . Transthoracic echocardiogram  05/14/2012    EF 50-55%, normal systolic function; mild MR (ordered for mitral valve disease 424.0)  . Hand surgery  1998    x3  . Eye surgery  [age] y.o.  . Gallbladder surgery  2003  . Permanent pacemaker generator change N/A 09/06/2012    Procedure: PERMANENT PACEMAKER GENERATOR CHANGE;  Surgeon: Thurmon Fair, MD;  Location: [facility] CATH LAB;  Service: Cardiovascular;  Laterality: N/A;    Outpatient Prescriptions Prior to Visit  Medication Sig Dispense Refill  . aspirin 81 MG tablet Take 81 mg by mouth daily.    . budesonide-formoterol (SYMBICORT) 160-4.5 MCG/ACT inhaler Take 2 puffs first thing in am and then another 2 puffs about 12 hours later. 1 Inhaler 11  . carbamazepine (CARBATROL) 200 MG 12 hr capsule Take 200 mg by mouth 2 (two)  times daily.    [name]. ethambutol (MYAMBUTOL) 400 MG tablet TAKE TWO  AND ONE-HALF TABLET BY MOUTH ONCE DAILY 76 tablet 0  . FLUoxetine (PROZAC) 20 MG capsule Take 20 mg by mouth 2 (two) times daily.    [name]. ipratropium (ATROVENT HFA) 17 MCG/ACT inhaler Inhale 2 puffs into the lungs every 6 (six) hours as needed.     [name]. ipratropium-albuterol (DUONEB) 0.5-2.5 (3) MG/3ML SOLN Take 3 mLs by nebulization every 4 (four) hours as needed (SOB). 360 mL 1  . omeprazole (PRILOSEC) 20 MG capsule Take 20 mg by mouth 2 (two) times  daily.     . vitamin B-6 (PYRIDOXINE) 25 MG tablet Take 1 tablet (25 mg total) by mouth daily. 100 tablet 3  . azithromycin (ZITHROMAX) 500 MG tablet TAKE ONE TABLET BY MOUTH ONCE DAILY (Patient not taking: Reported on 03/10/2015) 30 tablet 5  . isoniazid (NYDRAZID) 300 MG tablet Take 1 tablet (300 mg total) by mouth daily. (Patient not taking: Reported on 03/10/2015) 30 tablet 6  . naproxen sodium (ANAPROX) 220 MG tablet Take 440 mg by mouth 3 (three) times daily as needed (pain). Reported on 03/10/2015    . ondansetron (ZOFRAN ODT) 8 MG disintegrating tablet Take 1 tablet (8 mg total) by mouth every 8 (eight) hours as needed for nausea or vomiting. (Patient not taking: Reported on 03/10/2015) 60 tablet 0  . ondansetron (ZOFRAN) 8 MG tablet TAKE ONE TABLET BY MOUTH EVERY 8 HOURS AS NEEDED FOR NAUSEA OR VOMITING. (Patient not taking: Reported on 03/10/2015) 30 tablet 2   No facility-administered medications prior to visit.     Allergies:   Shellfish allergy   Social History   Social History  . Marital Status: Single    Spouse Name: N/A  . Number of Children: 2  . Years of Education: 12   Occupational History  . Disabled    Social History Main Topics  . Smoking status: Former Smoker -- 1.00 packs/day for 40 years    Types: Cigarettes    Quit date: 06/06/2013  . Smokeless tobacco: Never Used  . Alcohol Use: No  . Drug Use: No  . Sexual Activity: Not Currently   Other Topics Concern  . None   Social History Narrative     Family History:  The patient's family history includes CAD in his father; COPD in his father; Heart Problems in his mother and other; Heart attack in his maternal grandmother, paternal grandfather, and paternal grandmother; Heart disease in his father; Hypertension in his maternal grandfather.   ROS:   Please see the history of present illness.    ROS All other systems reviewed and are negative.   PHYSICAL EXAM:   VS:  BP 130/86 mmHg  Pulse 68  Ht 5\' 9"  (1.753 m)   Wt 70.789 kg (156 lb 1 oz)  BMI 23.04 kg/m2   GEN: Well nourished, well developed, in no acute distress HEENT: normal Neck: no JVD, carotid bruits, or masses Cardiac: RRR; no murmurs, rubs, or gallops,no edema , healthy left subclavian pacemaker site Respiratory:  clear to auscultation bilaterally, normal work of breathing GI: soft, nontender, nondistended, + BS MS: no deformity or atrophy Skin: warm and dry, no rash Neuro:  Alert and Oriented x 3, Strength and sensation are intact Psych: euthymic mood, full affect  Wt Readings from Last 3 Encounters:  03/10/15 70.789 kg (156 lb 1 oz)  03/20/14 73.483 kg (162 lb)  01/14/14 68.493 kg (151 lb)  Studies/Labs Reviewed:   EKG:  EKG is ordered today.  The ekg ordered today demonstrates a paced ventricular sensed rhythm with vertical axis and early repolarization changes  Recent Labs: 03/20/2014: ALT 18; BUN 15; Creat 0.95; Hemoglobin 13.5; Platelets 255; Potassium 4.1; Sodium 140   Lipid Panel    Component Value Date/Time   CHOL 153 09/04/2012 0839   TRIG 96 09/04/2012 0839   HDL 45 09/04/2012 0839   CHOLHDL 3.4 09/04/2012 0839   VLDL 19 09/04/2012 0839   LDLCALC 89 09/04/2012 0839     ASSESSMENT:    1. Sinus node dysfunction (HCC)   2. Pacemaker - Medtronic 2005, generator change September 2014   3. Paroxysmal atrial fibrillation (HCC)      PLAN:  In order of problems listed above:  1. No symptoms of bradycardia or chronotropic incompetence with current pacemaker settings 2. Normal pacemaker function. Reeducated him about the importance of routine follow-up. We will get him a cell phone transmitter adapter and he needs to do transmissions every 3 months. 3. His episodes of atrial fibrillation are very infrequent (roughly once a year) very brief (under 10 minutes) and asymptomatic. Specific therapy is not indicated. His embolic risk is low. CHADSVasc score zero. Continue aspirin. He will be a very poor candidate for  full anticoagulation because of his compliance issues.  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    Medication Adjustments/Labs and Tests Ordered: Current medicines are reviewed at length with the patient today.  Concerns regarding medicines are outlined above.  Medication changes, Labs and Tests ordered today are listed in the Patient Instructions below. Patient Instructions  Remote monitoring is used to monitor your Pacemaker from home. This monitoring reduces the number of office visits required to check your device to one time per year. It allows [place] to monitor the functioning of your device to ensure it is working properly. You are scheduled for a device check from home on June 11, 2015. You may send your transmission at any time that day. If you have a wireless device, the transmission will be sent automatically. After your physician reviews your transmission, you will receive a postcard with your next transmission date.  Dr. Royann Shivers recommends that you schedule a follow-up appointment in: ONE YEAR WITH PACEMAKER CHECK (MEDTRONIC-GRAY)           Joie Bimler, MD  03/11/2015 8:05 AM    [facility] [address] [place], [place], [place]  [address] Phone: [phone]; Fax: [phone]

## 2015-03-10 NOTE — Patient Instructions (Signed)
Remote monitoring is used to monitor your Pacemaker from home. This monitoring reduces the number of office visits required to check your device to one time per year. It allows us to monitor the functioning of your device to ensure it is working properly. You are scheduled for a device check from home on June 11, 2015. You may send your transmission at any time that day. If you have a wireless device, the transmission will be sent automatically. After your physician reviews your transmission, you will receive a postcard with your next transmission date.  [name] recommends that you schedule a follow-up appointment in: ONE YEAR WITH PACEMAKER CHECK (MEDTRONIC-GRAY)    

## 2015-03-27 LAB — CUP PACEART INCLINIC DEVICE CHECK
Battery Remaining Longevity: 150 mo
Battery Voltage: 2.79 V
Brady Statistic AP VP Percent: 0 %
Brady Statistic AP VS Percent: 70 %
Brady Statistic AS VP Percent: 0 %
Brady Statistic AS VS Percent: 30 %
Date Time Interrogation Session: 20170307161809
Implantable Lead Implant Date: 20050421
Implantable Lead Implant Date: 20050421
Implantable Lead Location: 753859
Implantable Lead Location: 753860
Lead Channel Impedance Value: 405 Ohm
Lead Channel Impedance Value: 592 Ohm
Lead Channel Setting Pacing Amplitude: 1.5 V
Lead Channel Setting Pacing Amplitude: 2 V
Lead Channel Setting Pacing Pulse Width: 0.4 ms
MDC IDC MSMT BATTERY IMPEDANCE: 135 Ohm
MDC IDC SET LEADCHNL RV SENSING SENSITIVITY: 5.6 mV

## 2015-06-05 ENCOUNTER — Other Ambulatory Visit: Payer: Self-pay | Admitting: Internal Medicine

## 2015-06-11 ENCOUNTER — Encounter: Payer: Medicare HMO | Admitting: *Deleted

## 2015-06-12 ENCOUNTER — Encounter: Payer: Self-pay | Admitting: Cardiology

## 2015-06-15 DIAGNOSIS — A159 Respiratory tuberculosis unspecified: Secondary | ICD-10-CM | POA: Insufficient documentation

## 2015-07-11 IMAGING — CR DG CHEST 2V
2 series · 2 of 2 positions shown · non-contrast
Comparison: [REDACTED]

CLINICAL DATA: Pacemaker exchanged

CHEST - 2 VIEW

[w chest pa]
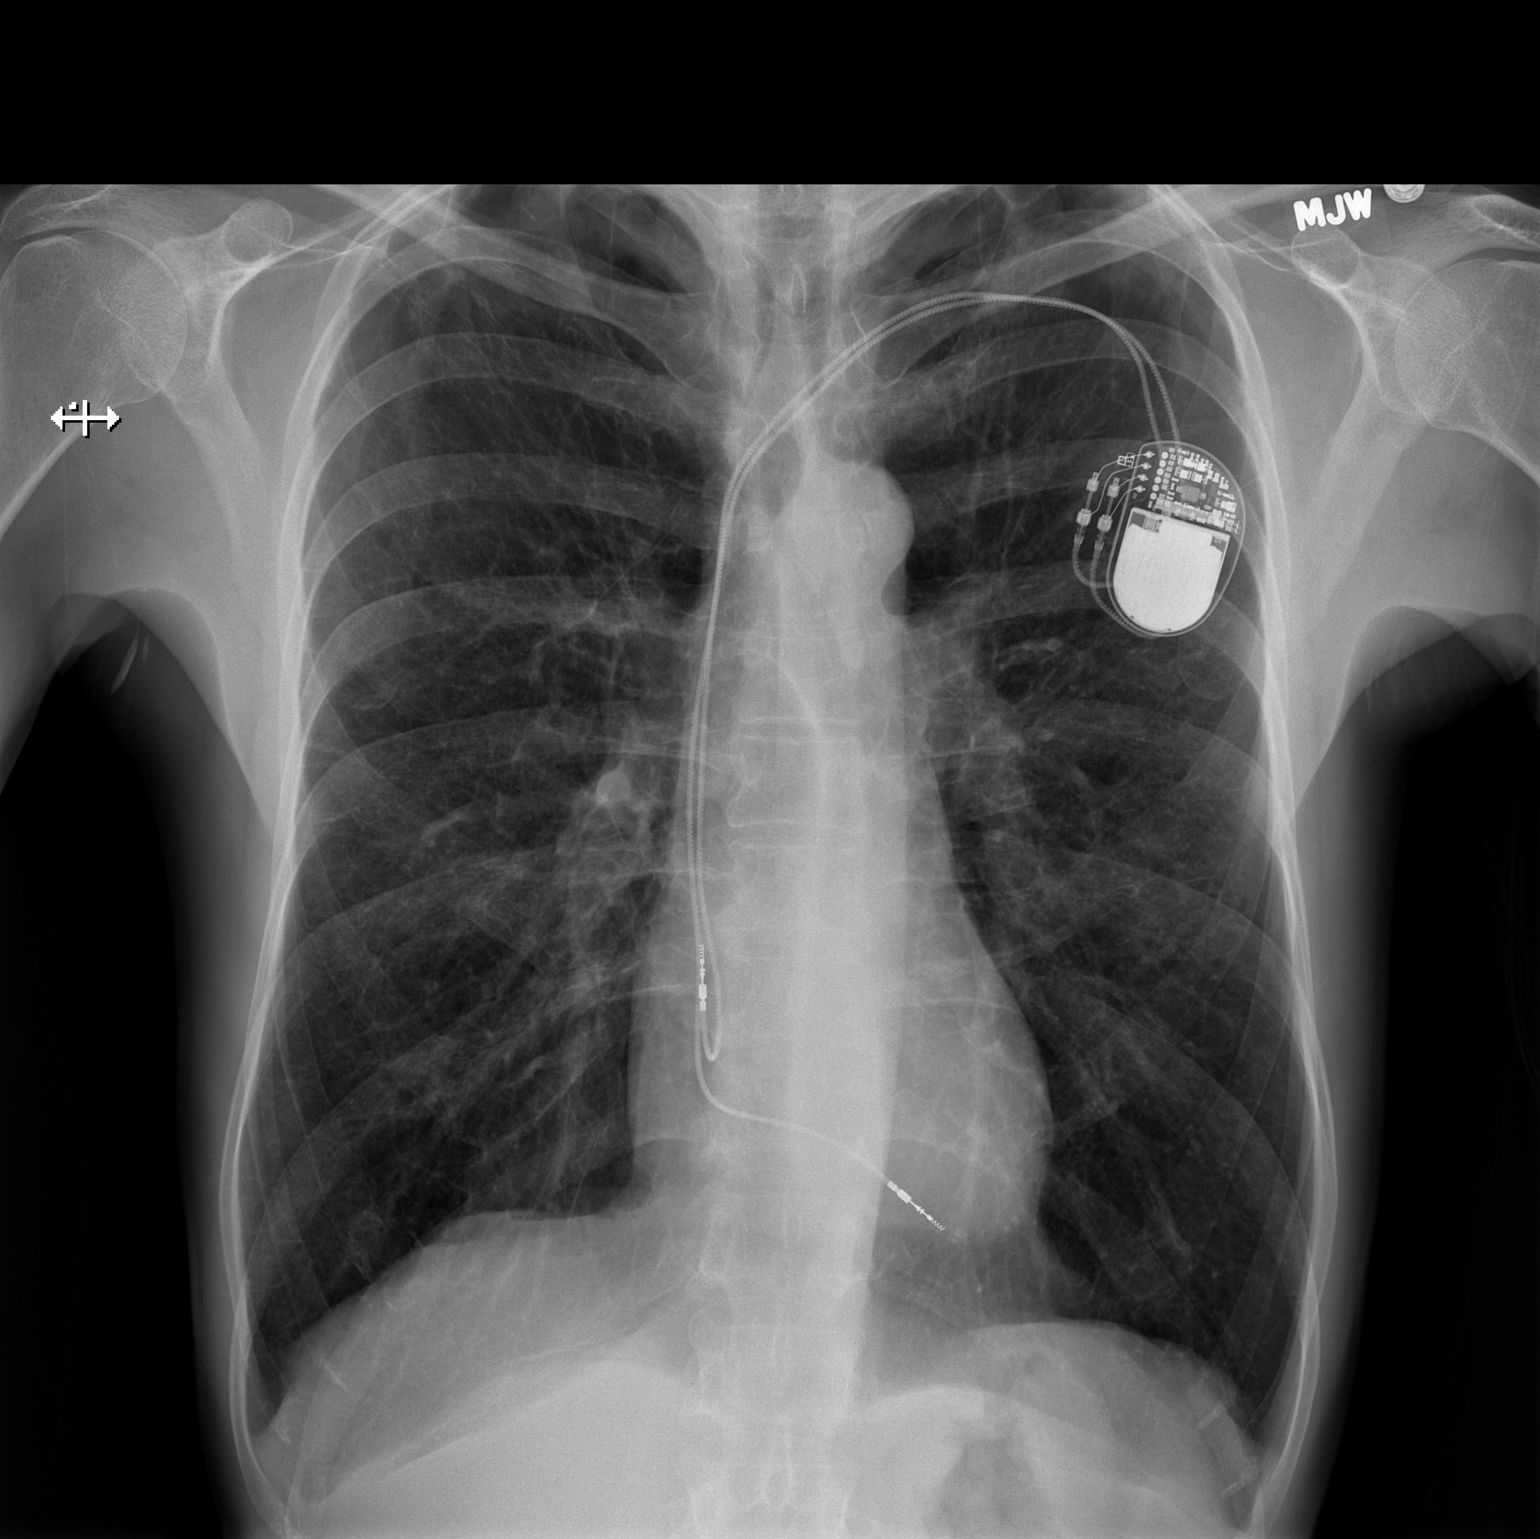

[w chest lat]
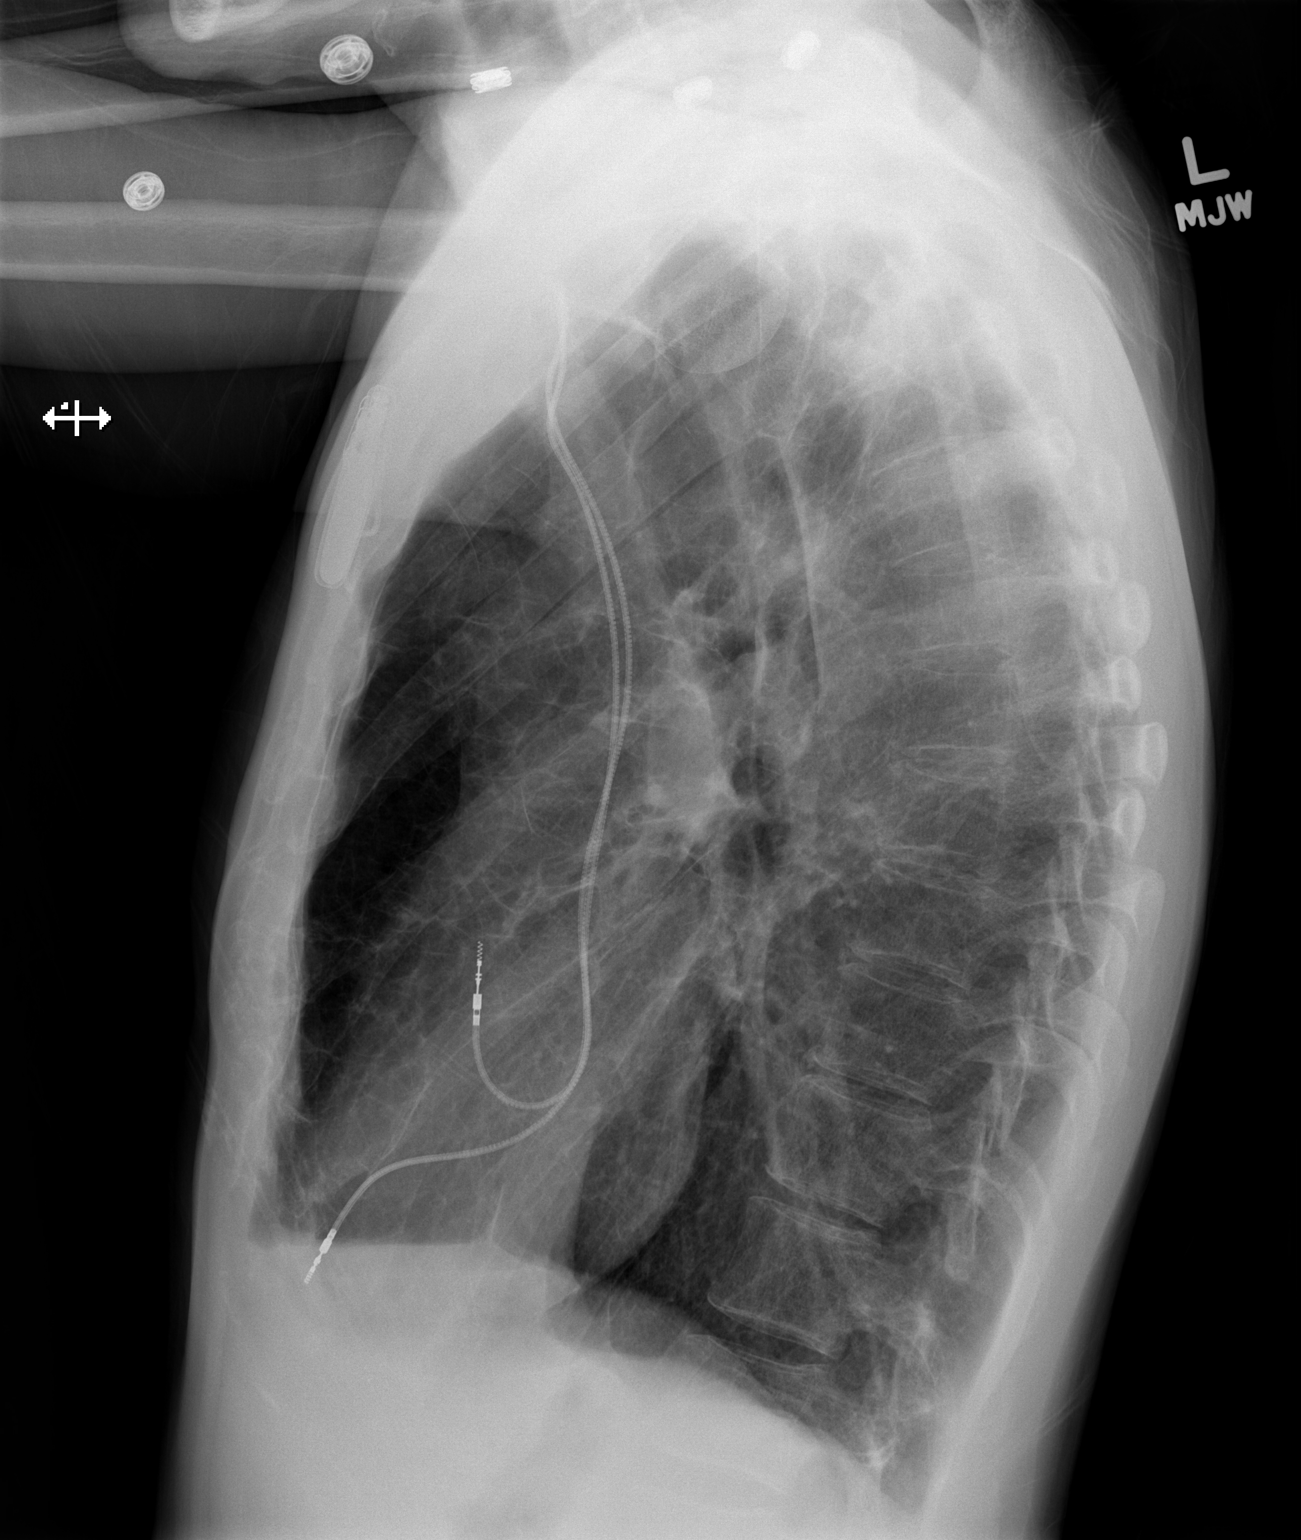

[2 of 2 positions shown; findings below may reference images not displayed]

FINDINGS: There is a left-sided pacemaker generator pack with two
continuous leads which overlies a stable cardiac silhouette.  Lungs
are hyperinflated.  No effusion infiltrate, or pneumothorax.
IMPRESSION: 1.  No acute cardiopulmonary findings.
2.  Emphysematous change.

## 2015-07-28 ENCOUNTER — Ambulatory Visit (INDEPENDENT_AMBULATORY_CARE_PROVIDER_SITE_OTHER): Payer: Medicare HMO | Admitting: Internal Medicine

## 2015-07-28 ENCOUNTER — Ambulatory Visit
Admission: RE | Admit: 2015-07-28 | Discharge: 2015-07-28 | Disposition: A | Discharge status: Home / Self Care | Payer: Medicare HMO | Source: Ambulatory Visit | Attending: Internal Medicine | Admitting: Internal Medicine

## 2015-07-28 ENCOUNTER — Encounter: Payer: Self-pay | Admitting: Internal Medicine

## 2015-07-28 VITALS — BP 147/93 | HR 76 | Temp 97.4°F | Ht 68.0 in | Wt 154.0 lb

## 2015-07-28 DIAGNOSIS — A310 Pulmonary mycobacterial infection: Secondary | ICD-10-CM | POA: Diagnosis not present

## 2015-07-28 LAB — COMPLETE METABOLIC PANEL WITH GFR
ALT: 13 U/L (ref 9–46)
AST: 18 U/L (ref 10–35)
Albumin: 4.2 g/dL (ref 3.6–5.1)
Alkaline Phosphatase: 126 U/L — ABNORMAL HIGH (ref 40–115)
BUN: 18 mg/dL (ref 7–25)
CALCIUM: 9.3 mg/dL (ref 8.6–10.3)
CHLORIDE: 101 mmol/L (ref 98–110)
CO2: 29 mmol/L (ref 20–31)
Creat: 1.03 mg/dL (ref 0.70–1.33)
GFR, Est Non African American: 82 mL/min (ref >=60)
Glucose, Bld: 75 mg/dL (ref 65–99)
POTASSIUM: 4.5 mmol/L (ref 3.5–5.3)
Sodium: 138 mmol/L (ref 135–146)
Total Bilirubin: 0.3 mg/dL (ref 0.2–1.2)
Total Protein: 7.2 g/dL (ref 6.1–8.1)

## 2015-07-28 LAB — CBC WITH DIFFERENTIAL/PLATELET
BASOS PCT: 1 %
Basophils Absolute: 63 cells/uL (ref 0–200)
Eosinophils Absolute: 567 cells/uL — ABNORMAL HIGH (ref 15–500)
Eosinophils Relative: 9 %
HEMATOCRIT: 41.5 % (ref 38.5–50.0)
Hemoglobin: 14 g/dL (ref 13.2–17.1)
LYMPHS PCT: 21 %
Lymphs Abs: 1323 cells/uL (ref 850–3900)
MCH: 32.6 pg (ref 27.0–33.0)
MCHC: 33.7 g/dL (ref 32.0–36.0)
MCV: 96.5 fL (ref 80.0–100.0)
MONO ABS: 504 {cells}/uL (ref 200–950)
MONOS PCT: 8 %
MPV: 9.8 fL (ref 7.5–12.5)
NEUTROS PCT: 61 %
Neutro Abs: 3843 cells/uL (ref 1500–7800)
PLATELETS: 273 10*3/uL (ref 140–400)
RBC: 4.3 MIL/uL (ref 4.20–5.80)
RDW: 13.3 % (ref 11.0–15.0)
WBC: 6.3 10*3/uL (ref 3.8–10.8)

## 2015-07-28 MED ORDER — GUAIFENESIN ER 600 MG PO TB12: 600.0000 mg | ORAL_TABLET | Freq: Two times a day (BID) | ORAL | Status: DC

## 2015-07-28 NOTE — Progress Notes (Signed)
Patient ID: Michael [name], male   DOB: [birth year], 55 y.o.   MRN: [id number]       Patient ID: Michael [name], male   DOB: 03-18-1960, 55 y.o.   MRN: [id number] RFV: chronic cough, SOB, hx of pulmonary MAC HPI Michael is a 55yo M previously treated for MAC and LTBI for 9 months. Last seen in March 2016 where he finished his treatment shortly thereafter. He recently reports having worsening productive cough and easily short of breath with exertion. Last seen Dr Sherene Sires as well in 2016, released from care.   Outpatient Encounter Prescriptions as of 07/28/2015  Medication Sig  . aspirin 81 MG tablet Take 81 mg by mouth daily.  . budesonide-formoterol (SYMBICORT) 160-4.5 MCG/ACT inhaler Take 2 puffs first thing in am and then another 2 puffs about 12 hours later.  . carbamazepine (CARBATROL) 200 MG 12 hr capsule Take 200 mg by mouth 2 (two) times daily.  [name] ethambutol (MYAMBUTOL) 400 MG tablet TAKE TWO  AND ONE-HALF TABLET BY MOUTH ONCE DAILY  . FLUoxetine (PROZAC) 20 MG capsule Take 20 mg by mouth 2 (two) times daily.  [name] ipratropium (ATROVENT HFA) 17 MCG/ACT inhaler Inhale 2 puffs into the lungs every 6 (six) hours as needed.   [name] ipratropium-albuterol (DUONEB) 0.5-2.5 (3) MG/3ML SOLN Take 3 mLs by nebulization every 4 (four) hours as needed (SOB).  [name] omeprazole (PRILOSEC) 20 MG capsule Take 20 mg by mouth 2 (two) times daily.   . vitamin B-6 (PYRIDOXINE) 25 MG tablet Take 1 tablet (25 mg total) by mouth daily.   No facility-administered encounter medications on file as of 07/28/2015.      Patient Active Problem List   Diagnosis Date Noted  . Paroxysmal atrial fibrillation (HCC) 10/22/2013  . Sinus node dysfunction (HCC) 10/22/2013  . MAC (mycobacterium avium-intracellulare complex) 08/15/2013  . COPD GOLD III  07/08/2013  . COPD exacerbation (HCC) 06/19/2013  . CAP (community acquired pneumonia) 06/14/2013  . Protein-calorie malnutrition, severe (HCC) 06/14/2013  . Weight loss 09/14/2012  . Symptomatic  bradycardia 09/06/2012  . Pacemaker - Medtronic 2005, generator change September 2014 09/06/2012   Sochx: no smoking though has 2nd smoke exposure   There are no preventive care reminders to display for this patient.   Review of Systems Weight loss of 15 lb. Mild loss of appetite Physical Exam   BP (!) 147/93   Pulse 76   Temp 97.4 F (36.3 C) (Oral)   Ht  (1.727 m)   Wt 154 lb (69.9 kg)   BMI 23.42 kg/m  Physical Exam  Constitutional: He is oriented to person, place, and time. He appears well-developed and well-nourished. No distress.  HENT:  Mouth/Throat: Oropharynx is clear and moist. No oropharyngeal exudate.  Cardiovascular: Normal rate, regular rhythm and normal heart sounds. Exam reveals no gallop and no friction rub.  No murmur heard.  Pulmonary/Chest: Effort normal and breath sounds normal. No respiratory distress. Mild inspiratory wheezes at bases Abdominal: Soft. Bowel sounds are normal. He exhibits no distension. There is no tenderness.  Lymphadenopathy:  He has no cervical adenopathy.  Neurological: He is alert and oriented to person, place, and time.  Skin: Skin is warm and dry. No rash noted. No erythema.  Psychiatric: He has a normal mood and affect. His behavior is normal.     CBC Lab Results  Component Value Date   WBC 5.3 03/20/2014   RBC 4.03 (L) 03/20/2014   HGB 13.5 03/20/2014   HCT 39.0  03/20/2014   PLT 255 03/20/2014   MCV 96.8 03/20/2014   MCH 33.5 03/20/2014   MCHC 34.6 03/20/2014   RDW 12.2 03/20/2014   LYMPHSABS 0.8 03/20/2014   MONOABS 0.4 03/20/2014   EOSABS 0.5 03/20/2014   BASOSABS 0.1 03/20/2014   BMET Lab Results  Component Value Date   NA 140 03/20/2014   K 4.1 03/20/2014   CL 104 03/20/2014   CO2 28 03/20/2014   GLUCOSE 58 (L) 03/20/2014   BUN 15 03/20/2014   CREATININE 0.95 03/20/2014   CALCIUM 9.1 03/20/2014   GFRNONAA >89 03/20/2014   GFRAA >89 03/20/2014     Assessment and Plan History of pulmonary  mac= Will check for ongoing pulmonary mac infection. Will check cxr, chest CT. Also have sputum afb cx  Copd = will refer back to [name] to see if his pulmonary regimen should be changed, re-evaluate with new PFT.  rtc in 6 wk

## 2015-07-30 ENCOUNTER — Telehealth: Payer: Self-pay | Admitting: *Deleted

## 2015-07-30 NOTE — Telephone Encounter (Signed)
Needing [facility] PCP authorization for CT Chest wo Contrast.  Left message for Billing Office, Sharman Crate, to obtain [facility] authorization for CT.  [facility] Triage telephone # left to call back.

## 2015-07-31 NOTE — Telephone Encounter (Signed)
[facility] called regarding pre cert for CT and wanted to know what facility this was going to be done at. Advised it would be scheduled at [facility]. Wendall Mola

## 2015-08-04 ENCOUNTER — Other Ambulatory Visit: Payer: Medicare HMO

## 2015-08-04 DIAGNOSIS — A310 Pulmonary mycobacterial infection: Secondary | ICD-10-CM

## 2015-08-05 ENCOUNTER — Other Ambulatory Visit: Payer: Medicare HMO

## 2015-08-05 NOTE — Telephone Encounter (Signed)
Left message for Ms. Michael [name] to call back on 08/06/15 with [facility] authorization for CT at [facility], [address] for the [employer].

## 2015-08-12 ENCOUNTER — Telehealth: Payer: Self-pay | Admitting: *Deleted

## 2015-08-12 NOTE — Telephone Encounter (Signed)
PCP office obtained authorization for CT Chest today, 08/12/15.  Pt is scheduled at [facility] for 08/13/15 at 1130.  PCP has notified the pt of the appt.  PC office needing written CT Chest without contrast order faxed to her.  Order written and faxed to [phone].

## 2015-08-24 ENCOUNTER — Other Ambulatory Visit: Payer: Self-pay | Admitting: Internal Medicine

## 2015-08-25 ENCOUNTER — Ambulatory Visit (INDEPENDENT_AMBULATORY_CARE_PROVIDER_SITE_OTHER): Payer: Medicare HMO | Admitting: Internal Medicine

## 2015-08-25 ENCOUNTER — Encounter: Payer: Self-pay | Admitting: Internal Medicine

## 2015-08-25 VITALS — BP 134/91 | HR 61 | Temp 97.5°F | Wt 160.8 lb

## 2015-08-25 DIAGNOSIS — J438 Other emphysema: Secondary | ICD-10-CM

## 2015-08-25 DIAGNOSIS — Z23 Encounter for immunization: Secondary | ICD-10-CM

## 2015-08-25 MED ORDER — ALBUTEROL SULFATE HFA 108 (90 BASE) MCG/ACT IN AERS: 2.0000 | INHALATION_SPRAY | Freq: Four times a day (QID) | RESPIRATORY_TRACT | 2 refills | Status: DC | PRN

## 2015-08-25 NOTE — Progress Notes (Signed)
Patient ID: Michael [name], male   DOB: 03/14/1960, 55 y.o.   MRN: [id number]  HPI Michael [name] with history of emphyseam, who was previously treated for MAC and LTBI last year. In the last few months, Michael [name] has felt that Michael [name] has increasing productive cough, stable with shortness of breath with exertion. Concern for ongoing MAC infection. Repeat sputum cultures done on 8/1 which did not show any AFB at 3 wk mark. Michael [name] had chest CT done at [facility] which showed  Severe emphysema. Left apical scarring and subsegmental atelectasis with mild bronchiectasis. Minimal dependent right lower lobe subsegmental atelectasis. No adenopathy. Normal heart  size. Pacemaker. No pleural or pericardial effusion.   - last seen by pulmonary in 2015, unclear if Michael [name] is taking his inhalers as directed  symbicort 2 puffs am and pm, but also using atrovent 2 puffs every 4 hrs if needed  Has not smoked in over 4 yrs  Outpatient Encounter Prescriptions as of 08/25/2015  Medication Sig  . aspirin 81 MG tablet Take 81 mg by mouth daily.  . budesonide-formoterol (SYMBICORT) 160-4.5 MCG/ACT inhaler Take 2 puffs first thing in am and then another 2 puffs about 12 hours later.  . carbamazepine (CARBATROL) 200 MG 12 hr capsule Take 200 mg by mouth 2 (two) times daily.  [name]. FLUoxetine (PROZAC) 20 MG capsule Take 20 mg by mouth 2 (two) times daily.  [name]. ipratropium (ATROVENT HFA) 17 MCG/ACT inhaler Inhale 2 puffs into the lungs every 6 (six) hours as needed.   [name]. omeprazole (PRILOSEC) 20 MG capsule Take 20 mg by mouth 2 (two) times daily.   [name]. ipratropium-albuterol (DUONEB) 0.5-2.5 (3) MG/3ML SOLN Take 3 mLs by nebulization every 4 (four) hours as needed (SOB). (Patient not taking: Reported on 08/25/2015)   Facility-Administered Encounter Medications as of 08/25/2015  Medication  . guaiFENesin (MUCINEX) 12 hr tablet 600 mg     Patient Active Problem List   Diagnosis Date Noted  . Paroxysmal atrial fibrillation (HCC) 10/22/2013  . Sinus node  dysfunction (HCC) 10/22/2013  . MAC (mycobacterium avium-intracellulare complex) 08/15/2013  . COPD GOLD III  07/08/2013  . COPD exacerbation (HCC) 06/19/2013  . CAP (community acquired pneumonia) 06/14/2013  . Protein-calorie malnutrition, severe (HCC) 06/14/2013  . Weight loss 09/14/2012  . Symptomatic bradycardia 09/06/2012  . Pacemaker - Medtronic 2005, generator change September 2014 09/06/2012     Health Maintenance Due  Topic Date Due  . INFLUENZA VACCINE  08/04/2015     Review of Systems Review of Systems  Constitutional: Negative for fever, chills, diaphoresis, activity change, appetite change, fatigue and unexpected weight change.  HENT: Negative for congestion, sore throat, rhinorrhea, sneezing, trouble swallowing and sinus pressure.  Eyes: Negative for photophobia and visual disturbance.  Respiratory: positive for cough,  shortness of breath, occ. wheezing and no stridor.  Cardiovascular: Negative for chest pain, palpitations and leg swelling.  Gastrointestinal: Negative for nausea, vomiting, abdominal pain, diarrhea, constipation, blood in stool, abdominal distention and anal bleeding.  Genitourinary: Negative for dysuria, hematuria, flank pain and difficulty urinating.  Musculoskeletal: Negative for myalgias, back pain, joint swelling, arthralgias and gait problem.  Skin: Negative for color change, pallor, rash and wound.  Neurological: Negative for dizziness, tremors, weakness and light-headedness.  Hematological: Negative for adenopathy. Does not bruise/bleed easily.  Psychiatric/Behavioral: Negative for behavioral problems, confusion, sleep disturbance, dysphoric mood, decreased concentration and agitation.    Physical Exam   BP (!) 134/91 (BP Location: Right Arm, Patient Position: Sitting, Cuff Size:  Normal)   Pulse 61   Temp 97.5 F (36.4 C) (Oral)   Wt 160 lb 12.8 oz (72.9 kg)   SpO2 98%   BMI 24.45 kg/m  Physical Exam  Constitutional: Michael [name] is oriented to  person, place, and time. Michael [name] appears well-developed and well-nourished. No distress.  HENT:  Mouth/Throat: Oropharynx is clear and moist. No oropharyngeal exudate.  Cardiovascular: Normal rate, regular rhythm and normal heart sounds. Exam reveals no gallop and no friction rub.  No murmur heard.  Pulmonary/Chest: Effort normal and breath sounds normal. No respiratory distress. Michael [name] has no wheezes.  Abdominal: Soft. Bowel sounds are normal. Michael [name] exhibits no distension. There is no tenderness.  Lymphadenopathy:  Michael [name] has no cervical adenopathy.  Neurological: Michael [name] is alert and oriented to person, place, and time.  Skin: Skin is warm and dry. No rash noted. No erythema.  Psychiatric: Michael [name] has a normal mood and affect. His behavior is normal.    CBC Lab Results  Component Value Date   WBC 6.3 07/28/2015   RBC 4.30 07/28/2015   HGB 14.0 07/28/2015   HCT 41.5 07/28/2015   PLT 273 07/28/2015   MCV 96.5 07/28/2015   MCH 32.6 07/28/2015   MCHC 33.7 07/28/2015   RDW 13.3 07/28/2015   LYMPHSABS 1,323 07/28/2015   MONOABS 504 07/28/2015   EOSABS 567 (H) 07/28/2015   BASOSABS 63 07/28/2015   BMET Lab Results  Component Value Date   NA 138 07/28/2015   K 4.5 07/28/2015   CL 101 07/28/2015   CO2 29 07/28/2015   GLUCOSE 75 07/28/2015   BUN 18 07/28/2015   CREATININE 1.03 07/28/2015   CALCIUM 9.3 07/28/2015   GFRNONAA 82 07/28/2015   GFRAA >89 07/28/2015     Assessment and Plan   Severe emphysema = not sure if Michael [name] is optimized in his regimen. I recommend that we get him re-established with pulmonary. In the meantime, will get PFT to compare to his prior studies in 2015  Chest CT showing severe emphysema with scarring that has been known. Will look for CT imaging to see if can compare instead of just reading results to see if clinically worsening  pulm afb sputum thus far are negative. At this point, would like to optimize pulm regimen. Continue to wait for sputum cx. Get PFTs. If cx + for  MAC, then may consider repeat treatment course.  occaisonal wheezing = will also give bronchodilator refill for albuterol  Hx of pulmonary MAC = will follow culture results to decide if we think

## 2015-09-01 ENCOUNTER — Telehealth: Payer: Self-pay

## 2015-09-01 NOTE — Telephone Encounter (Signed)
Called to follow up with Dr. Drue [facility]Snider [facility] referral to Dr. Sherene SiresWert. Spoke with Chantel at [facility] that stated they see the referral in the work cue and that it is being worked on with patient. Michael Brockandace Shaune Westfall, LPN

## 2015-09-18 LAB — AFB CULTURE WITH SMEAR (NOT AT ARMC)

## 2015-11-04 ENCOUNTER — Encounter: Payer: Self-pay | Admitting: Internal Medicine

## 2015-11-04 ENCOUNTER — Ambulatory Visit (INDEPENDENT_AMBULATORY_CARE_PROVIDER_SITE_OTHER): Payer: Medicare HMO | Admitting: Internal Medicine

## 2015-11-04 VITALS — BP 146/90 | HR 67 | Temp 97.5°F | Wt 153.8 lb

## 2015-11-04 DIAGNOSIS — F410 Panic disorder [episodic paroxysmal anxiety] without agoraphobia: Secondary | ICD-10-CM | POA: Diagnosis not present

## 2015-11-04 DIAGNOSIS — Z8659 Personal history of other mental and behavioral disorders: Secondary | ICD-10-CM

## 2015-11-04 DIAGNOSIS — A310 Pulmonary mycobacterial infection: Secondary | ICD-10-CM

## 2015-11-04 DIAGNOSIS — R0602 Shortness of breath: Secondary | ICD-10-CM

## 2015-11-04 DIAGNOSIS — J438 Other emphysema: Secondary | ICD-10-CM | POA: Diagnosis not present

## 2015-11-04 NOTE — Progress Notes (Signed)
RFV: hx of pulmonary MAC  Patient ID: Michael [name], male   DOB: 04/19/1960, 55 y.o.   MRN: [id number]  HPI  Michael [name] with history of emphysema, who was previously treated for MAC and LTBI last year. In the last few months, he has felt that he has increasing productive cough, stable with shortness of breath with exertion. Concern for ongoing MAC infection. Repeat sputum cultures done on 8/1 which did not show any AFB at 3 wk mark. He had chest CT done at [facility] which showed  Severe emphysema. Left apical scarring and subsegmental atelectasis with mild bronchiectasis. Minimal dependent right lower lobe subsegmental atelectasis. No adenopathy. Normal heart  size. Pacemaker. No pleural or pericardial effusion.   Hx of electrocution in 1998 requiring pacemaker. Sustained memory loss as well, unclear if baseline cognition was also affected. He is here with his mother.  He may have missed the call from pulmonary clinic to try to get him to see back with pulmonology.  Outpatient Encounter Prescriptions as of 11/04/2015  Medication Sig  . albuterol (PROVENTIL HFA;VENTOLIN HFA) 108 (90 Base) MCG/ACT inhaler Inhale 2 puffs into the lungs every 6 (six) hours as needed for wheezing or shortness of breath.  [name]. aspirin 81 MG tablet Take 81 mg by mouth daily.  . carbamazepine (CARBATROL) 200 MG 12 hr capsule Take 200 mg by mouth 2 (two) times daily.  [name]. omeprazole (PRILOSEC) 20 MG capsule Take 20 mg by mouth 2 (two) times daily.   . budesonide-formoterol (SYMBICORT) 160-4.5 MCG/ACT inhaler Take 2 puffs first thing in am and then another 2 puffs about 12 hours later. (Patient not taking: Reported on 11/04/2015)  . FLUoxetine (PROZAC) 20 MG capsule Take 20 mg by mouth 2 (two) times daily.  [name]. ipratropium (ATROVENT HFA) 17 MCG/ACT inhaler Inhale 2 puffs into the lungs every 6 (six) hours as needed.   [name]. ipratropium-albuterol (DUONEB) 0.5-2.5 (3) MG/3ML SOLN Take 3 mLs by nebulization every 4 (four) hours as needed  (SOB). (Patient not taking: Reported on 11/04/2015)   Facility-Administered Encounter Medications as of 11/04/2015  Medication  . guaiFENesin (MUCINEX) 12 hr tablet 600 mg     Patient Active Problem List   Diagnosis Date Noted  . Paroxysmal atrial fibrillation (HCC) 10/22/2013  . Sinus node dysfunction (HCC) 10/22/2013  . MAC (mycobacterium avium-intracellulare complex) 08/15/2013  . COPD GOLD III  07/08/2013  . COPD exacerbation (HCC) 06/19/2013  . CAP (community acquired pneumonia) 06/14/2013  . Protein-calorie malnutrition, severe (HCC) 06/14/2013  . Weight loss 09/14/2012  . Symptomatic bradycardia 09/06/2012  . 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   Review of Systems + shortness of breath with occasional wheezing with increased excertion Physical Exam   Wt 153 lb 12.8 oz (69.8 kg)   BMI 23.39 kg/m  Physical Exam  Constitutional: He is oriented to person, place, and time. He appears well-developed and well-nourished. No distress.  HENT:  Mouth/Throat: Oropharynx is clear and moist. No oropharyngeal exudate.  Cardiovascular: Normal rate, regular rhythm and normal heart sounds. Exam reveals no gallop and no friction rub.  No murmur heard.  Pulmonary/Chest: Effort normal and breath sounds normal. No respiratory distress. He has no wheezes.  Abdominal: Soft. Bowel sounds are normal. He exhibits no distension. There is no tenderness.  Lymphadenopathy:  He has no cervical adenopathy.  Neurological: He is alert and oriented to person, place, and  time.  Skin: Skin is warm and dry. No rash noted. No erythema.  Psychiatric: He has a normal mood and affect. His behavior is normal.    CBC Lab Results  Component Value Date   WBC 6.3 07/28/2015   RBC 4.30 07/28/2015   HGB 14.0 07/28/2015   HCT 41.5 07/28/2015   PLT 273 07/28/2015   MCV 96.5 07/28/2015   MCH 32.6  07/28/2015   MCHC 33.7 07/28/2015   RDW 13.3 07/28/2015   LYMPHSABS 1,323 07/28/2015   MONOABS 504 07/28/2015   EOSABS 567 (H) 07/28/2015   BASOSABS 63 07/28/2015   BMET Lab Results  Component Value Date   NA 138 07/28/2015   K 4.5 07/28/2015   CL 101 07/28/2015   CO2 29 07/28/2015   GLUCOSE 75 07/28/2015   BUN 18 07/28/2015   CREATININE 1.03 07/28/2015   CALCIUM 9.3 07/28/2015   GFRNONAA 82 07/28/2015   GFRAA >89 07/28/2015     Assessment and Plan Emphysema = we will get Pulmonary function test and have him follow up with pulmonary on Tuesday nov 28th, dr wert.   Psychiatry referral for panic attack and PTSD  pulm mac =at this time, I am not entirely convinced that his symptoms have to do with worsening MAC infection. We have not re-isolated it at this time  Shortness of breath = would like to have him be seen by pulmonary, fine tune his regimen and see if still symptomatic, may need to do cardiac work up vs. Seeing is shortness of breath is associated with panic disorder

## 2015-11-12 ENCOUNTER — Other Ambulatory Visit: Payer: Self-pay | Admitting: Internal Medicine

## 2015-11-19 ENCOUNTER — Encounter: Payer: Self-pay | Admitting: Cardiovascular Disease

## 2015-11-19 ENCOUNTER — Telehealth: Payer: Self-pay

## 2015-11-19 NOTE — Telephone Encounter (Signed)
Sent to PCP via epic. Dr. Shellia CarwinMcKinlay not in epic, please fax.

## 2015-11-19 NOTE — Telephone Encounter (Signed)
Request for surgical clearance:   1. What type surgery is being performed? 6 surgical tooth extractions, 1 simple tooth extraction  2. When is this surgery scheduled? Pending  3. Are there any medications that need to be held prior to surgery and how long? N/A; patient is taking ASA 81 mg daily  4. Name of the physician performing surgery: Dr Alberteen SpindleJustin McKinlay  5. What is the office phone and fax number?   Phone [phone]  Fax [phone]

## 2015-11-19 NOTE — Telephone Encounter (Signed)
Faxed clearance letter to [facility], Dr Georgeanne NimMcKinlay's office.  Michael [name] (DPR), patient's mother, requested a copy of letter and form be mailed to patient.

## 2015-11-20 ENCOUNTER — Telehealth: Payer: Self-pay | Admitting: *Deleted

## 2015-11-20 NOTE — Telephone Encounter (Signed)
Patient's mother left message stating the patient needed an "informal referral" for behavioral health. Please advise. Andree CossHowell, Thales Knipple M, RN

## 2015-11-20 NOTE — Telephone Encounter (Signed)
I think they just need the number to behavior health in order to get established with psychiatry for his panic disorder

## 2015-12-01 ENCOUNTER — Institutional Professional Consult (permissible substitution): Payer: Medicare HMO | Admitting: Internal Medicine

## 2015-12-01 NOTE — Telephone Encounter (Signed)
I left a message with the referral coordinator regarding patient's request 11/27.

## 2015-12-03 ENCOUNTER — Other Ambulatory Visit: Payer: Self-pay | Admitting: *Deleted

## 2015-12-03 DIAGNOSIS — F410 Panic disorder [episodic paroxysmal anxiety] without agoraphobia: Secondary | ICD-10-CM

## 2015-12-03 DIAGNOSIS — F4310 Post-traumatic stress disorder, unspecified: Secondary | ICD-10-CM

## 2015-12-08 ENCOUNTER — Ambulatory Visit (INDEPENDENT_AMBULATORY_CARE_PROVIDER_SITE_OTHER): Payer: Medicare HMO | Admitting: Internal Medicine

## 2015-12-08 ENCOUNTER — Encounter: Payer: Self-pay | Admitting: Internal Medicine

## 2015-12-08 ENCOUNTER — Other Ambulatory Visit (INDEPENDENT_AMBULATORY_CARE_PROVIDER_SITE_OTHER): Payer: Medicare HMO

## 2015-12-08 ENCOUNTER — Ambulatory Visit (INDEPENDENT_AMBULATORY_CARE_PROVIDER_SITE_OTHER)
Admission: RE | Admit: 2015-12-08 | Discharge: 2015-12-08 | Disposition: A | Discharge status: Home / Self Care | Payer: Medicare HMO | Source: Ambulatory Visit | Attending: Internal Medicine | Admitting: Internal Medicine

## 2015-12-08 VITALS — BP 130/82 | HR 78 | Ht 68.0 in | Wt 157.2 lb

## 2015-12-08 DIAGNOSIS — J9612 Chronic respiratory failure with hypercapnia: Secondary | ICD-10-CM

## 2015-12-08 DIAGNOSIS — J449 Chronic obstructive pulmonary disease, unspecified: Secondary | ICD-10-CM

## 2015-12-08 LAB — CBC WITH DIFFERENTIAL/PLATELET
BASOS ABS: 0.1 10*3/uL (ref 0.0–0.1)
Basophils Relative: 0.5 % (ref 0.0–3.0)
EOS ABS: 0 10*3/uL (ref 0.0–0.7)
Eosinophils Relative: 0 % (ref 0.0–5.0)
HEMATOCRIT: 38.9 % — AB (ref 39.0–52.0)
HEMOGLOBIN: 13.3 g/dL (ref 13.0–17.0)
LYMPHS PCT: 8.8 % — AB (ref 12.0–46.0)
Lymphs Abs: 1 10*3/uL (ref 0.7–4.0)
MCHC: 34.2 g/dL (ref 30.0–36.0)
MCV: 97.7 fl (ref 78.0–100.0)
MONOS PCT: 6.7 % (ref 3.0–12.0)
Monocytes Absolute: 0.7 10*3/uL (ref 0.1–1.0)
Neutro Abs: 9.4 10*3/uL — ABNORMAL HIGH (ref 1.4–7.7)
Neutrophils Relative %: 84 % — ABNORMAL HIGH (ref 43.0–77.0)
Platelets: 323 10*3/uL (ref 150.0–400.0)
RBC: 3.98 Mil/uL — ABNORMAL LOW (ref 4.22–5.81)
RDW: 12.8 % (ref 11.5–15.5)
WBC: 11.2 10*3/uL — AB (ref 4.0–10.5)

## 2015-12-08 LAB — BASIC METABOLIC PANEL
BUN: 15 mg/dL (ref 6–23)
CALCIUM: 8.9 mg/dL (ref 8.4–10.5)
CO2: 34 meq/L — AB (ref 19–32)
Chloride: 102 mEq/L (ref 96–112)
Creatinine, Ser: 1.11 mg/dL (ref 0.40–1.50)
GFR: 73.12 mL/min (ref >60.00)
GLUCOSE: 111 mg/dL — AB (ref 70–99)
Potassium: 3.9 mEq/L (ref 3.5–5.1)
Sodium: 141 mEq/L (ref 135–145)

## 2015-12-08 MED ORDER — BUDESONIDE-FORMOTEROL FUMARATE 160-4.5 MCG/ACT IN AERO: INHALATION_SPRAY | RESPIRATORY_TRACT | 11 refills | Status: DC

## 2015-12-08 MED ORDER — IPRATROPIUM BROMIDE HFA 17 MCG/ACT IN AERS: 2.0000 | INHALATION_SPRAY | Freq: Four times a day (QID) | RESPIRATORY_TRACT | 11 refills | Status: DC

## 2015-12-08 NOTE — Patient Instructions (Addendum)
Plan A = Automatic = Symbicort 160 Take 2 puffs first thing in am and then another 2 puffs about 12 hours later.                                      Atrovent 2 puffs four times day   Plan B = Backup Only use your albuterol (Ventolin)as a rescue medication to be used if you can't catch your breath by resting or doing a relaxed purse lip breathing pattern.  - The less you use it, the better it will work when you need it. - Ok to use the inhaler up to 2 puffs  every 4 hours if you must but call for appointment if use goes up over your usual need - Don't leave home without it !!  (think of it like the spare tire for your car)   Plan C = Crisis - only use your albuterol nebulizer if you first try Plan B and it fails to help > ok to use the nebulizer up to every 4 hours but if start needing it regularly call for immediate appointment  Please remember to go to the lab and x-ray department downstairs for your tests - we will call you with the results when they are available.     Please schedule a follow up office visit in 4 weeks, sooner if needed

## 2015-12-08 NOTE — Assessment & Plan Note (Addendum)
-   reports quit smokng 06/2013  - Spirometry 08/19/2013  FEV1 1.85 ( 47%) ratio 44  - Spirometry 12/08/2015   FEV1 1.53 (43%)  Ratio 40 off maint rx   - 12/08/2015  After extensive coaching HFA effectiveness =    75% > start symb 160/atrovent qid  (not prn)   Pt is probably  Group D in terms of symptom/risk and laba/lama/ICS  therefore appropriate rx at this point but will try laba/ics and sama qid then consider adding lama or change to lama/laba and d/c ICS next ov depending on his progresss  Total time devoted to counseling  > 50 % of a 60 m office visit:  review case with pt/ discussion of options/alternatives/ personally creating written customized instructions  in presence of pt  then going over those specific  Instructions directly with the pt including how to use all of the meds but in particular covering each new medication in detail and the difference between the maintenance/automatic meds and the prns using an action plan format for the latter.  Please see AVS from this visit for a full list of these instructions .

## 2015-12-08 NOTE — Progress Notes (Signed)
Subjective:     Patient ID: Michael [name], male   DOB: 10/30/1960,     MRN: [id number]  HPI  [age] yowm quit smoking 06/2013 referred to pulmonary clinic 12/08/2015 by Michael [name]   Michael [name] with GOLD III copd documented 08/19/13   08/19/13 ov / Michael [name] GOLD III copd  rec Continue symbicort 160 Take 2 puffs first thing in am and then another 2 puffs about 12 hours later.  Only use your  atrovent as a rescue medication Work on inhaler technique:    12/08/2015  Re-establish/ extended  Pulmonary office visit/ Michael [name]  maint rx qvar / atrovent prn Chief Complaint  Patient presents with  . Pulm Consult    Pt. has a history of COPD.   breathing gradually worse x sev months / assoc with coughing > mostly white  rx zpak/ prednisone x 12/04/15 no better  Doe = MMRC3 = can't walk 100 yards even at a slow pace at a flat grade s stopping due to sob    No obvious day to day or daytime variability or  purulent sputum or mucus plugs or hemoptysis or cp or chest tightness, subjective wheeze or overt sinus or hb symptoms. No unusual exp hx or h/o childhood pna/ asthma or knowledge of premature birth.  Sleeping ok without nocturnal  or early am exacerbation  of respiratory  c/o's or need for noct saba. Also denies any obvious fluctuation of symptoms with weather or environmental changes or other aggravating or alleviating factors except as outlined above   Current Medications, Allergies, Complete Past Medical History, Past Surgical History, Family History, and Social History were reviewed in [name] electronic medical record.  ROS  The following are not active complaints unless bolded sore throat, dysphagia, dental problems, itching, sneezing,  nasal congestion or excess/ purulent secretions, ear ache,   fever, chills, sweats, unintended wt loss, classically pleuritic or exertional cp,  orthopnea pnd or leg swelling, presyncope, palpitations, abdominal pain, anorexia, nausea, vomiting, diarrhea  or change in bowel or bladder  habits, change in stools or urine, dysuria,hematuria,  rash, arthralgias, visual complaints, headache, numbness, weakness or ataxia or problems with walking or coordination,  change in mood/affect or memory.          Review of Systems     Objective:   Physical Exam    amb wm nad looks > stated age   Wt Readings from Last 3 Encounters:  12/08/15 157 lb 3.2 oz (71.3 kg)  11/04/15 153 lb 12.8 oz (69.8 kg)  08/25/15 160 lb 12.8 oz (72.9 kg)    Vital signs reviewed - Note on arrival 02 sats  98% on RA     HEENT: nl dentition, turbinates, and oropharynx. Nl external ear canals without cough reflex   NECK :  without JVD/Nodes/TM/ nl carotid upstrokes bilaterally   LUNGS: no acc muscle use,  Nl contour chest which is clear to A and P bilaterally without cough on insp or exp maneuvers   CV:  RRR  no s3 or murmur or increase in P2, nad no edema   ABD:  soft and nontender with nl inspiratory excursion in the supine position. No bruits or organomegaly appreciated, bowel sounds nl  MS:  Nl gait/ ext warm without deformities, calf tenderness, cyanosis or clubbing No obvious joint restrictions   SKIN: warm and dry without lesions    NEURO:  alert, approp, nl sensorium with  no motor or cerebellar deficits apparent.      CXR  PA and Lateral:   12/08/2015 :    I personally reviewed images and agree with radiology impression as follows:   Stable COPD.  No superimposed infiltrate or pulmonary edema.  Labs ordered/ reviewed:      Chemistry      Component Value Date/Time   NA 141 12/08/2015 1517   K 3.9 12/08/2015 1517   CL 102 12/08/2015 1517   CO2 34 (H) 12/08/2015 1517   BUN 15 12/08/2015 1517   CREATININE 1.11 12/08/2015 1517   CREATININE 1.03 07/28/2015 1202      Component Value Date/Time   CALCIUM 8.9 12/08/2015 1517   ALKPHOS 126 (H) 07/28/2015 1202   AST 18 07/28/2015 1202   ALT 13 07/28/2015 1202   BILITOT 0.3 07/28/2015 1202        Lab Results  Component  Value Date   WBC 11.2 (H) 12/08/2015   HGB 13.3 12/08/2015   HCT 38.9 (L) 12/08/2015   MCV 97.7 12/08/2015   PLT 323.0 12/08/2015      Labs ordered 12/08/2015  [facility] AT screen       Assessment:

## 2015-12-09 DIAGNOSIS — J9612 Chronic respiratory failure with hypercapnia: Secondary | ICD-10-CM | POA: Insufficient documentation

## 2015-12-09 LAB — RESPIRATORY ALLERGY PROFILE REGION II ~~LOC~~
ASPERGILLUS FUMIGATUS M3: 0.1 kU/L — AB
Allergen, A. alternata, m6: <0.1 kU/L
Allergen, Comm Silver Birch, t9: <0.1 kU/L
Allergen, D pternoyssinus,d7: 0.4 kU/L — ABNORMAL HIGH
Allergen, Mulberry, t76: <0.1 kU/L
Allergen, Oak,t7: <0.1 kU/L
Box Elder IgE: <0.1 kU/L
Cat Dander: <0.1 kU/L
Cockroach: 0.26 kU/L — ABNORMAL HIGH
D. farinae: 0.36 kU/L — ABNORMAL HIGH
IGE (IMMUNOGLOBULIN E), SERUM: 347 kU/L — AB (ref <115)
Johnson Grass: <0.1 kU/L
Rough Pigweed  IgE: <0.1 kU/L
Sheep Sorrel IgE: <0.1 kU/L

## 2015-12-09 NOTE — Assessment & Plan Note (Signed)
HCO3 12/08/2015 = 34   Though somewhat paradoxic, when the lung fails to clear C02 properly and pC02 rises the lung then becomes a more efficient scavenger of C02 allowing lower work of breathing and  better C02 clearance albeit at a higher serum pC02 level - this is why pts can look a lot better than their ABG's would suggest and why it's so difficult to prognosticate endstage dz.  It's also why I strongly rec DNI status (ventilating pts down to a nl pC02 adversely affects this compensatory mechanism)   Therefore no specific rx planned

## 2015-12-09 NOTE — Progress Notes (Signed)
lmtcb

## 2015-12-10 ENCOUNTER — Telehealth: Payer: Self-pay | Admitting: Internal Medicine

## 2015-12-10 NOTE — Telephone Encounter (Signed)
Spoke with the pt's mother and notified of results and she verbalized understanding

## 2015-12-10 NOTE — Progress Notes (Signed)
LMTCB

## 2015-12-10 NOTE — Progress Notes (Signed)
Spoke with the pt's mother and notified of results

## 2015-12-10 NOTE — Progress Notes (Signed)
Spoke with the pt's mother and notified of results

## 2015-12-11 LAB — ALPHA-1-ANTITRYPSIN: A-1 Antitrypsin, Ser: 189 mg/dL (ref 83–199)

## 2015-12-14 LAB — ALPHA-1 ANTITRYPSIN PHENOTYPE: A1 ANTITRYPSIN: 178 mg/dL (ref 83–199)

## 2016-01-05 ENCOUNTER — Encounter: Payer: Self-pay | Admitting: Internal Medicine

## 2016-01-05 ENCOUNTER — Ambulatory Visit (INDEPENDENT_AMBULATORY_CARE_PROVIDER_SITE_OTHER): Payer: Medicare HMO | Admitting: Internal Medicine

## 2016-01-05 VITALS — BP 115/78 | HR 65 | Temp 97.8°F | Ht 67.0 in | Wt 155.8 lb

## 2016-01-05 DIAGNOSIS — A310 Pulmonary mycobacterial infection: Secondary | ICD-10-CM

## 2016-01-05 NOTE — Progress Notes (Signed)
RFV: follow up for hx of pulmonary MAC  Patient ID: Michael [name], male   DOB: 1960/02/22, 56 y.o.   MRN: [id number]  HPI Michael [name] is a 56yo M with hx of emphysema and pulmonary MAC infection, he was Seen by Dr Melvyn Novas on Dec 5th to give instructions on COPD meds. Has upcoming appt on 5th with dr wert in follow up for management of symptoms  Has psych appt on 24th to follow up on depression. He has relatives squatting on their property, having difficulty evicting them. Recommended calling police to evict cousins since they appear not living their premises and causing undue stress.  He still reports having some occasional productive cough. He is increasing fatigue, weight is stable. His mother feels that it maybe due to worsening depression. He has been using inhalers as directed by Dr Melvyn Novas and feels that he is having some improvement.  No thoughts or plans for self-harm.  Outpatient Encounter Prescriptions as of 01/05/2016  Medication Sig  . albuterol (PROVENTIL HFA;VENTOLIN HFA) 108 (90 Base) MCG/ACT inhaler Inhale 2 puffs into the lungs every 6 (six) hours as needed for wheezing or shortness of breath.  [name] aspirin 81 MG tablet Take 81 mg by mouth daily.  . budesonide-formoterol (SYMBICORT) 160-4.5 MCG/ACT inhaler Take 2 puffs first thing in am and then another 2 puffs about 12 hours later.  . carbamazepine (CARBATROL) 200 MG 12 hr capsule Take 200 mg by mouth 2 (two) times daily.  [name] FLUoxetine (PROZAC) 20 MG capsule Take 20 mg by mouth 2 (two) times daily.  [name] ipratropium (ATROVENT HFA) 17 MCG/ACT inhaler Inhale 2 puffs into the lungs 4 (four) times daily.  [name] ipratropium-albuterol (DUONEB) 0.5-2.5 (3) MG/3ML SOLN Take 3 mLs by nebulization every 4 (four) hours as needed (SOB).  [name] omeprazole (PRILOSEC) 20 MG capsule Take 20 mg by mouth 2 (two) times daily.    Facility-Administered Encounter Medications as of 01/05/2016  Medication  . guaiFENesin (MUCINEX) 12 hr tablet 600 mg     Patient Active  Problem List   Diagnosis Date Noted  . Chronic respiratory failure with hypercapnia ([place]) 12/09/2015  . Paroxysmal atrial fibrillation ([place]) 10/22/2013  . Sinus node dysfunction ([place]) 10/22/2013  . MAC (mycobacterium avium-intracellulare complex) 08/15/2013  . COPD GOLD III  07/08/2013  . COPD exacerbation ([place]) 06/19/2013  . CAP (community acquired pneumonia) 06/14/2013  . Protein-calorie malnutrition, severe ([place]) 06/14/2013  . Weight loss 09/14/2012  . Symptomatic bradycardia 09/06/2012  . 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   Review of Systems  Constitutional: Negative for fever, chills, diaphoresis, activity change, appetite change, fatigue and unexpected weight change.  HENT: Negative for congestion, sore throat, rhinorrhea, sneezing, trouble swallowing and sinus pressure.  Eyes: Negative for photophobia and visual disturbance.  Respiratory: Negative for cough, chest tightness, shortness of breath, wheezing and stridor.  Cardiovascular: Negative for chest pain, palpitations and leg swelling.  Gastrointestinal: Negative for nausea, vomiting, abdominal pain, diarrhea, constipation, blood in stool, abdominal distention and anal bleeding.  Genitourinary: Negative for dysuria, hematuria, flank pain and difficulty urinating.  Musculoskeletal: Negative for myalgias, back pain, joint swelling, arthralgias and gait problem.  Skin: Negative for color change, pallor, rash and wound.  Neurological: Negative for dizziness, tremors, weakness and light-headedness.  Hematological: Negative for adenopathy. Does not bruise/bleed easily.  Psychiatric/Behavioral: Negative for behavioral problems, confusion, sleep disturbance, dysphoric mood, decreased concentration and  agitation.    Physical Exam   BP 115/78   Pulse 65   Temp 97.8 F (36.6 C) (Oral)   Ht _0  (1.702 m)   Wt 155 lb  12.8 oz (70.7 kg)   BMI 24.40 kg/m  Physical Exam  Constitutional: He is oriented to person, place, and time. He appears well-developed and well-nourished. No distress.  HENT:  Mouth/Throat: Oropharynx is clear and moist. No oropharyngeal exudate.  Cardiovascular: Normal rate, regular rhythm and normal heart sounds. Exam reveals no gallop and no friction rub.  No murmur heard.  Pulmonary/Chest: Effort normal and breath sounds normal. No respiratory distress. He has no wheezes.  Abdominal: Soft. Bowel sounds are normal. He exhibits no distension. There is no tenderness.  Lymphadenopathy:  He has no cervical adenopathy.  Neurological: He is alert and oriented to person, place, and time.  Skin: Skin is warm and dry. No rash noted. No erythema.  Psychiatric: He has a normal mood and affect. His behavior is normal.    CBC Lab Results  Component Value Date   WBC 11.2 (H) 12/08/2015   RBC 3.98 (L) 12/08/2015   HGB 13.3 12/08/2015   HCT 38.9 (L) 12/08/2015   PLT 323.0 12/08/2015   MCV 97.7 12/08/2015   MCH 32.6 07/28/2015   MCHC 34.2 12/08/2015   RDW 12.8 12/08/2015   LYMPHSABS 1.0 12/08/2015   MONOABS 0.7 12/08/2015   EOSABS 0.0 12/08/2015   BASOSABS 0.1 12/08/2015   BMET Lab Results  Component Value Date   NA 141 12/08/2015   K 3.9 12/08/2015   CL 102 12/08/2015   CO2 34 (H) 12/08/2015   GLUCOSE 111 (H) 12/08/2015   BUN 15 12/08/2015   CREATININE 1.11 12/08/2015   CALCIUM 8.9 12/08/2015   GFRNONAA 82 07/28/2015   GFRAA >89 07/28/2015     Assessment and Plan Hx of pulmonary MAC- will check sputum AFB cx to see if can re-isolate MAC I suspect his symptoms are still mostly due to emphysema and optimizing his regimen  Emphysema = continue with management as directed by Dr Melvyn Novas  Fatigue= difficult to tell if is worsening due to underlying pulmonary process vs. Due to depression. He has upcoming psychiatric appt which should be helpful  rtc in 3 months

## 2016-01-08 ENCOUNTER — Ambulatory Visit (INDEPENDENT_AMBULATORY_CARE_PROVIDER_SITE_OTHER): Payer: Medicare HMO | Admitting: Internal Medicine

## 2016-01-08 ENCOUNTER — Encounter: Payer: Self-pay | Admitting: Internal Medicine

## 2016-01-08 VITALS — BP 104/70 | HR 70 | Ht 67.0 in | Wt 150.0 lb

## 2016-01-08 DIAGNOSIS — J9612 Chronic respiratory failure with hypercapnia: Secondary | ICD-10-CM

## 2016-01-08 DIAGNOSIS — J449 Chronic obstructive pulmonary disease, unspecified: Secondary | ICD-10-CM | POA: Diagnosis not present

## 2016-01-08 DIAGNOSIS — Z2239 Carrier of other specified bacterial diseases: Secondary | ICD-10-CM

## 2016-01-08 MED ORDER — ALBUTEROL SULFATE HFA 108 (90 BASE) MCG/ACT IN AERS: 2.0000 | INHALATION_SPRAY | Freq: Four times a day (QID) | RESPIRATORY_TRACT | 2 refills | Status: DC | PRN

## 2016-01-08 NOTE — Assessment & Plan Note (Signed)
-   reports quit smokng 06/2013  - Spirometry 08/19/2013  FEV1 1.85 ( 47%) ratio 44  - Spirometry 12/08/2015   FEV1 1.53 (43%)  Ratio 40 off maint rx  - 12/08/2015  After extensive coaching HFA effectiveness =    75% > start symb 160/atrovent qid   - Allergy profile 12/08/15  >  Eos 0.0 /  IgE  347  RAST pos dust/ mold - [facility] AT screen 12/08/15 > MM/ levels ok     - 01/08/2016  After extensive coaching HFA effectiveness =    90%   Severe copd well compensated with proportionate reduction in ex tol and no tendency to aecopd despite concern re MAI > no change in rx needed  I had an extended discussion with the patient reviewing all relevant studies completed to date and  lasting 15 to 20 minutes of a 25 minute visit    Each maintenance medication was reviewed in detail including most importantly the difference between maintenance and prns and under what circumstances the prns are to be triggered using an action plan format that is not reflected in the computer generated alphabetically organized AVS.    Please see AVS for specific instructions unique to this visit that I personally wrote and verbalized to the the pt in detail and then reviewed with pt  by my nurse highlighting any  changes in therapy recommended at today's visit to their plan of care.

## 2016-01-08 NOTE — Patient Instructions (Signed)
No change in inhalers  Please schedule a follow up visit in 3 months but call sooner if needed with cxr on return

## 2016-01-08 NOTE — Assessment & Plan Note (Signed)
No evidence of active infection clinically/ f/u cxr in 3 months

## 2016-01-08 NOTE — Assessment & Plan Note (Signed)
HCO3 12/08/2015 = 34   Well compensated, saturating well s 02 > no change rx

## 2016-01-08 NOTE — Progress Notes (Addendum)
Subjective:     Patient ID: Michael [name], male   DOB: [birth year],     MRN: [id number]    Brief patient profile:  56 yowm quit smoking 06/2013 referred to pulmonary clinic 12/08/2015 by Dr  Ilsa Iha with GOLD III copd documented 08/19/13      History of Present Illness  08/19/13 ov / Michael [name] GOLD III copd  rec Continue symbicort 160 Take 2 puffs first thing in am and then another 2 puffs about 12 hours later.  Only use your  atrovent as a rescue medication Work on inhaler technique:     12/08/2015  Re-establish/ extended  Pulmonary office visit/ Michael [name]  maint rx qvar / atrovent prn Chief Complaint  Patient presents with  . Pulm Consult    Pt. has a history of COPD.   breathing gradually worse x sev months / assoc with coughing > mostly white  rx zpak/ prednisone x 12/04/15 no better  Doe = MMRC3 = can't walk 100 yards even at a slow pace at a flat grade s stopping due to sob   rec Plan A = Automatic = Symbicort 160 Take 2 puffs first thing in am and then another 2 puffs about 12 hours later.                                      Atrovent 2 puffs four times day  Plan B = Backup Only use your albuterol (Ventolin)as a rescue medication  Plan C = Crisis - only use your albuterol nebulizer if you first try Plan B and it fails to help > ok to use the nebulizer up to every 4 hours but if start needing it regularly call for immediate appointment Please remember to go to the lab and x-ray department downstairs for your tests - we will call you with the results when they are available.      01/08/2016  f/u ov/Michael [name] re: copd III/ hypercabic on symb 160 2bid/ atrovent 2qid Chief Complaint  Patient presents with  . Follow-up    Breathing some worse which he relates to cold weather. His cough and wheezing are unchanged. No new co's today. He is using ventolin at least once per day.   only uses ventolin if goes outside in cold/ or going up steps  Rare noct need for saba/no neb form needed at all  sleeps ok  flat most nights s am flares Doe still = MMRC3  No obvious day to day or daytime variability or  purulent sputum or mucus plugs or hemoptysis or cp or chest tightness, subjective wheeze or overt sinus or hb symptoms. No unusual exp hx or h/o childhood pna/ asthma or knowledge of premature birth.  Sleeping ok without nocturnal  or early am exacerbation  of respiratory  c/o's or need for noct saba. Also denies any obvious fluctuation of symptoms with weather or environmental changes or other aggravating or alleviating factors except as outlined above   Current Medications, Allergies, Complete Past Medical History, Past Surgical History, Family History, and Social History were reviewed in [employer] record.  ROS  The following are not active complaints unless bolded sore throat, dysphagia, dental problems, itching, sneezing,  nasal congestion or excess/ purulent secretions, ear ache,   fever, chills, sweats, unintended wt loss, classically pleuritic or exertional cp,  orthopnea pnd or leg swelling, presyncope, palpitations, abdominal pain, anorexia, nausea, vomiting, diarrhea  or change in bowel or bladder habits, change in stools or urine, dysuria,hematuria,  rash, arthralgias, visual complaints, headache, numbness, weakness or ataxia or problems with walking or coordination,  change in mood/affect or memory.               Objective:   Physical Exam    amb haggard thin wm nad looks>>  stated age    01/08/2016           12/08/15 157 lb 3.2 oz (71.3 kg)  11/04/15 153 lb 12.8 oz (69.8 kg)  08/25/15 160 lb 12.8 oz (72.9 kg)    Vital signs reviewed - Note on arrival 02 sats  97% on RA     HEENT: nl  urbinates, and oropharynx. Nl external ear canals without cough reflex- poor dentition    NECK :  without JVD/Nodes/TM/ nl carotid upstrokes bilaterally   LUNGS: no acc muscle use,  Nl contour chest with minimal insp and exp rhonchi    CV:  RRR  no s3 or murmur or  increase in P2, nad no edema   ABD:  soft and nontender with nl inspiratory excursion in the supine position. No bruits or organomegaly appreciated, bowel sounds nl  MS:  Nl gait/ ext warm without deformities, calf tenderness, cyanosis or clubbing No obvious joint restrictions   SKIN: warm and dry without lesions    NEURO:  alert, approp, nl sensorium with  no motor or cerebellar deficits apparent.                Assessment:
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
## 2016-01-27 ENCOUNTER — Ambulatory Visit (INDEPENDENT_AMBULATORY_CARE_PROVIDER_SITE_OTHER): Payer: Medicare HMO | Admitting: Psychiatry

## 2016-01-27 ENCOUNTER — Encounter (HOSPITAL_COMMUNITY): Payer: Self-pay | Admitting: Psychiatry

## 2016-01-27 VITALS — BP 128/86 | HR 78 | Ht 69.0 in | Wt 147.8 lb

## 2016-01-27 DIAGNOSIS — Z9889 Other specified postprocedural states: Secondary | ICD-10-CM | POA: Diagnosis not present

## 2016-01-27 DIAGNOSIS — Z91013 Allergy to seafood: Secondary | ICD-10-CM

## 2016-01-27 DIAGNOSIS — F4310 Post-traumatic stress disorder, unspecified: Secondary | ICD-10-CM | POA: Insufficient documentation

## 2016-01-27 DIAGNOSIS — Z79899 Other long term (current) drug therapy: Secondary | ICD-10-CM

## 2016-01-27 DIAGNOSIS — F331 Major depressive disorder, recurrent, moderate: Secondary | ICD-10-CM | POA: Diagnosis not present

## 2016-01-27 DIAGNOSIS — Z8249 Family history of ischemic heart disease and other diseases of the circulatory system: Secondary | ICD-10-CM

## 2016-01-27 DIAGNOSIS — Z7982 Long term (current) use of aspirin: Secondary | ICD-10-CM

## 2016-01-27 DIAGNOSIS — R45851 Suicidal ideations: Secondary | ICD-10-CM

## 2016-01-27 DIAGNOSIS — Z87891 Personal history of nicotine dependence: Secondary | ICD-10-CM

## 2016-01-27 MED ORDER — PRAZOSIN HCL 1 MG PO CAPS: ORAL_CAPSULE | ORAL | 0 refills | Status: DC

## 2016-01-27 MED ORDER — CARBAMAZEPINE ER 200 MG PO CP12: 200.0000 mg | ORAL_CAPSULE | Freq: Every day | ORAL | 0 refills | Status: DC

## 2016-01-27 MED ORDER — FLUOXETINE HCL 40 MG PO CAPS: 40.0000 mg | ORAL_CAPSULE | Freq: Every day | ORAL | 0 refills | Status: DC

## 2016-01-27 MED ORDER — QUETIAPINE FUMARATE 50 MG PO TABS: 25.0000 mg | ORAL_TABLET | Freq: Every day | ORAL | 0 refills | Status: DC

## 2016-01-27 NOTE — Patient Instructions (Addendum)
1. Continue fluoxetine 40 mg daily 2. Continue carmabazepine 200 mg daily 3. Start quetiapine 50 mg at night 4. Start prazosin 1mg  at night for 3 days, then 2 mg at night for 3 days, then 3 mg at night 5. Obtain blood test 6. Return to clinic in one month

## 2016-03-09 ENCOUNTER — Ambulatory Visit (HOSPITAL_COMMUNITY): Payer: Self-pay | Admitting: Psychiatry

## 2016-03-09 ENCOUNTER — Ambulatory Visit (INDEPENDENT_AMBULATORY_CARE_PROVIDER_SITE_OTHER): Payer: Medicare HMO | Admitting: Psychiatry

## 2016-03-09 ENCOUNTER — Encounter (HOSPITAL_COMMUNITY): Payer: Self-pay | Admitting: Psychiatry

## 2016-03-09 VITALS — BP 128/74 | HR 75 | Ht 69.0 in | Wt 145.0 lb

## 2016-03-09 DIAGNOSIS — F331 Major depressive disorder, recurrent, moderate: Secondary | ICD-10-CM

## 2016-03-09 DIAGNOSIS — Z79899 Other long term (current) drug therapy: Secondary | ICD-10-CM | POA: Diagnosis not present

## 2016-03-09 DIAGNOSIS — Z87891 Personal history of nicotine dependence: Secondary | ICD-10-CM | POA: Diagnosis not present

## 2016-03-09 DIAGNOSIS — F4310 Post-traumatic stress disorder, unspecified: Secondary | ICD-10-CM

## 2016-03-09 MED ORDER — QUETIAPINE FUMARATE 100 MG PO TABS: 100.0000 mg | ORAL_TABLET | Freq: Every day | ORAL | 1 refills | Status: DC

## 2016-03-09 MED ORDER — HYDROXYZINE PAMOATE 50 MG PO CAPS: 50.0000 mg | ORAL_CAPSULE | Freq: Every day | ORAL | 0 refills | Status: DC | PRN

## 2016-03-09 MED ORDER — FLUOXETINE HCL 40 MG PO CAPS: 40.0000 mg | ORAL_CAPSULE | Freq: Every day | ORAL | 1 refills | Status: DC

## 2016-03-09 MED ORDER — PRAZOSIN HCL 5 MG PO CAPS: ORAL_CAPSULE | ORAL | 1 refills | Status: DC

## 2016-03-09 NOTE — Patient Instructions (Signed)
Prazosin increase to 5 mg at night (new capsule)  Prozac 40 mg daily  Increase Seroquel to 50 mg for a few days, then increase to 100 mg nightly  Come back in about 1 month
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
## 2016-03-28 ENCOUNTER — Ambulatory Visit (HOSPITAL_COMMUNITY): Payer: Self-pay | Admitting: Psychiatry

## 2016-04-05 ENCOUNTER — Ambulatory Visit: Payer: Medicare HMO | Admitting: Internal Medicine

## 2016-04-08 ENCOUNTER — Telehealth: Payer: Self-pay | Admitting: Internal Medicine

## 2016-04-08 DIAGNOSIS — Z2239 Carrier of other specified bacterial diseases: Secondary | ICD-10-CM

## 2016-04-08 NOTE — Telephone Encounter (Signed)
Spoke with patient and asked him to be here at 145 for 2pm appointment to have chest xray performed prior to appointment. Pt requested I call his mother. Mother was called and message was left for patient to come to office at 145 to have chest xray performed. Order was placed for chest xray per MW instructions from 01/08/16 OV. Nothing further is needed.

## 2016-04-11 ENCOUNTER — Encounter: Payer: Self-pay | Admitting: Internal Medicine

## 2016-04-11 ENCOUNTER — Ambulatory Visit (INDEPENDENT_AMBULATORY_CARE_PROVIDER_SITE_OTHER): Payer: Medicare HMO | Admitting: Internal Medicine

## 2016-04-11 VITALS — BP 112/70 | HR 72 | Ht 69.0 in | Wt 147.0 lb

## 2016-04-11 DIAGNOSIS — J449 Chronic obstructive pulmonary disease, unspecified: Secondary | ICD-10-CM

## 2016-04-11 DIAGNOSIS — F17210 Nicotine dependence, cigarettes, uncomplicated: Secondary | ICD-10-CM | POA: Diagnosis not present

## 2016-04-11 NOTE — Progress Notes (Signed)
Subjective:     Patient ID: Michael [name], male   DOB: 02-16-1960,     MRN: [id number]    Brief patient profile:  78 yowm active smoker referred to pulmonary clinic 12/08/2015 by Dr  Ilsa Iha with GOLD III copd documented 08/19/13      History of Present Illness  08/19/13 ov / Michael [name] GOLD III copd  rec Continue symbicort 160 Take 2 puffs first thing in am and then another 2 puffs about 12 hours later.  Only use your  atrovent as a rescue medication Work on inhaler technique:     12/08/2015  Re-establish/ extended  Pulmonary office visit/ Michael [name]  maint rx qvar / atrovent prn Chief Complaint  Patient presents with  . Pulm Consult    Pt. has a history of COPD.   breathing gradually worse x sev months / assoc with coughing > mostly white  rx zpak/ prednisone x 12/04/15 no better  Doe = MMRC3 = can't walk 100 yards even at a slow pace at a flat grade s stopping due to sob   rec Plan A = Automatic = Symbicort 160 Take 2 puffs first thing in am and then another 2 puffs about 12 hours later.                                      Atrovent 2 puffs four times day  Plan B = Backup Only use your albuterol (Ventolin)as a rescue medication  Plan C = Crisis - only use your albuterol nebulizer if you first try Plan B and it fails to help > ok to use the nebulizer up to every 4 hours but if start needing it regularly call for immediate appointment Please remember to go to the lab and x-ray department downstairs for your tests - we will call you with the results when they are available.      01/08/2016  f/u ov/Michael [name] re: copd III/ hypercabic on symb 160 2bid/ atrovent 2qid Chief Complaint  Patient presents with  . Follow-up    Breathing some worse which he relates to cold weather. His cough and wheezing are unchanged. No new co's today. He is using ventolin at least once per day.   only uses ventolin if goes outside in cold/ or going up steps  Rare noct need for saba/no neb form needed at all  sleeps ok flat  most nights s am flares Doe still = MMRC3 rec No change in inhalers Please schedule a follow up visit in 3 months but call sooner if needed with cxr on return      04/11/2016  f/u ov/Michael [name] re: COPD III/ resumed smoking/ symb 160 / atrovent qid / saba  Chief Complaint  Patient presents with  . Follow-up    Seems to be coughing more recently. Cough is prod with clear to "somtimes kinda black" sputum.  He states "the inhalers don't last".  He started smoking again a few months ago 1/2- 1 ppd.   was doing better with cough and ex tol until resumed smoking/ cough worse in am's but clears w/in an hour of stirring   No obvious day to day or daytime variability or assoc   mucus plugs or hemoptysis or cp or chest tightness, subjective wheeze or overt sinus or hb symptoms. No unusual exp hx or h/o childhood pna/ asthma or knowledge of premature birth.  Sleeping ok without nocturnal  or early am exacerbation  of respiratory  c/o's or need for noct saba. Also denies any obvious fluctuation of symptoms with weather or environmental changes or other aggravating or alleviating factors except as outlined above   Current Medications, Allergies, Complete Past Medical History, Past Surgical History, Family History, and Social History were reviewed in [employer] record.  ROS  The following are not active complaints unless bolded sore throat, dysphagia, dental problems, itching, sneezing,  nasal congestion or excess/ purulent secretions, ear ache,   fever, chills, sweats, unintended wt loss, classically pleuritic or exertional cp,  orthopnea pnd or leg swelling, presyncope, palpitations, abdominal pain, anorexia, nausea, vomiting, diarrhea  or change in bowel or bladder habits, change in stools or urine, dysuria,hematuria,  rash, arthralgias, visual complaints, headache, numbness, weakness or ataxia or problems with walking or coordination,  change in mood/affect or memory.                  Objective:   Physical Exam    amb haggard thin wm nad looks>>  stated age    04/11/2016        89          12/08/15 157 lb 3.2 oz (71.3 kg)  11/04/15 153 lb 12.8 oz (69.8 kg)  08/25/15 160 lb 12.8 oz (72.9 kg)    Vital signs reviewed - Note on arrival 02 sats  98% on RA     HEENT: nl  urbinates, and oropharynx. Nl external ear canals without cough reflex- poor dentition    NECK :  without JVD/Nodes/TM/ nl carotid upstrokes bilaterally   LUNGS: no acc muscle use,  Nl contour chest with junky bilat  insp and exp rhonchi    CV:  RRR  no s3 or murmur or increase in P2, nad no edema   ABD:  soft and nontender with nl inspiratory excursion in the supine position. No bruits or organomegaly appreciated, bowel sounds nl  MS:  Nl gait/ ext warm without deformities, calf tenderness, cyanosis or clubbing No obvious joint restrictions   SKIN: warm and dry without lesions    NEURO:  alert, approp, nl sensorium with  no motor or cerebellar deficits apparent.       CXR PA and Lateral:   04/11/2016 :    I personally reviewed images and agree with radiology impression as follows:     Ordered, not done        Assessment:

## 2016-04-11 NOTE — Patient Instructions (Addendum)
For cough, 1st stop smoking  / mucinex dm 1200 mg every 12 hours as needed   Work hard on stopping all smoking before smoking stops you   Work on Cabin crew  inhaler technique:  relax and gently blow all the way out then take a nice smooth deep breath back in, triggering the inhaler at same time you start breathing in.  Hold for up to 5 seconds if you can. Blow out thru nose. Rinse and gargle with water when done    Continue symbicort 160 Take 2 puffs first thing in am and then another 2 puffs about 12 hours later and atovent is 2 pffs 2 x daily and additional doses as needed      Plan B = Backup Only use your albuterol (Proair= RED) instead of proventil as a rescue medication to be used if you can't catch your breath by resting or doing a relaxed purse lip breathing pattern.  - The less you use it, the better it will work when you need it. - Ok to use the inhaler up to 2 puffs  every 4 hours if you must but call for appointment if use goes up over your usual need - Don't leave home without it !!  (think of it like the spare tire for your car)   Plan C = Crisis - only use your albuterol nebulizer if you first try Plan B and it fails to help > ok to use the nebulizer up to every 4 hours but if start needing it regularly call for immediate appointment   Please remember to go to the  x-ray department downstairs in the basement  for your tests - we will call you with the results when they are available.   Please schedule a follow up visit in 3 months but call sooner if needed with all meds in hand

## 2016-04-12 ENCOUNTER — Telehealth: Payer: Self-pay | Admitting: Internal Medicine

## 2016-04-12 MED ORDER — BUDESONIDE-FORMOTEROL FUMARATE 160-4.5 MCG/ACT IN AERO: INHALATION_SPRAY | RESPIRATORY_TRACT | 5 refills | Status: DC

## 2016-04-12 MED ORDER — ALBUTEROL SULFATE HFA 108 (90 BASE) MCG/ACT IN AERS: 2.0000 | INHALATION_SPRAY | Freq: Four times a day (QID) | RESPIRATORY_TRACT | 5 refills | Status: DC | PRN

## 2016-04-12 NOTE — Telephone Encounter (Signed)
Pt's mother [name] (dpr on file) requesting refills on inhalers to be sent to pharmacy.  These have been sent.  Nothing further needed.

## 2016-04-12 NOTE — Progress Notes (Signed)
Patient ID: Michael [name], male   DOB: Jun 02, 1960, 56 y.o.   MRN: [id number]     Cardiology Office Note    Date:  04/13/2016   ID:  Michael [name], DOB 05-22-1960, MRN [id number]  PCP:  Imelda Pillow, NP  Cardiologist:   Sanda Klein, MD   chief complaint: chest pain   History of Present Illness:  Michael [name] is a 56 y.o. male with symptomatic bradycardia due to sinus node dysfunction, returning for a pacemaker check. As before, his compliance with follow-up and medications is poor. This is his first pacemaker check in a year. He has not been doing remote downloads.  He has started smoking again: for "his nerves". He complains of chest pain, described as pressure immediately to the right of the sternum. This occurs both at rest or with light activity. There is no commencing association with meals, position, coughing or physical activity. He has some tenderness to touch over the costochondral joints, but this is a different type of discomfort. He has not had palpitations, dizziness or syncope. Has a chronic cough, dark sputum and frequent wheezing for which he takes inhalers. He last saw his pulmonary specialist 2 days ago.  Device interrogation shows normal function. The current generator is a Medtronic Adapta implanted as a generator change out in 2014. Battery voltage suggests another 11.5 years of longevity. There is 64% atrial pacing and only 0.4% ventricular pacing. Very rare episodes of atrial fibrillation have been recorded. On 04/05/2016 had an episode that lasted for 5 minutes, average ventricular rate 129 bpm. Since 2015 he has only had 24 episodes of mode switch, some of which are brief regular atrial tachycardia, probably ectopic atrial tachycardia. None of these episodes have been symptomatic.    Past Medical History:  Diagnosis Date  . Anginal pain ([place])   . Bradycardia   . Chronic chest pain   . COPD (chronic obstructive pulmonary disease) ([place])   . Depression   . History  of nuclear stress test 06/14/2011   lexiscan; mild perfusion defect in basal inferosetpal & apical inferior region; negative for ischemia   . Pacemaker   . Pneumonia   . 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 Seizure disorder ([place])   . Shortness of breath   . Tobacco abuse     Past Surgical History:  Procedure Laterality Date  . CARDIAC CATHETERIZATION  2002, 2003   normal coronaries  . EYE SURGERY  [age] y.o.  . GALLBLADDER SURGERY  2003  . HAND SURGERY  1998   x3  . PACEMAKER INSERTION  04/23/2003   Medtronic Kappa; [facility] - symptomatic bradycardia  . PERMANENT PACEMAKER GENERATOR CHANGE N/A 09/06/2012   Procedure: PERMANENT PACEMAKER GENERATOR CHANGE;  Surgeon: Sanda Klein, MD;  Location: [facility] CATH [facility];  Service: Cardiovascular;  Laterality: N/A;  . TRANSTHORACIC ECHOCARDIOGRAM  05/14/2012   EF 63-33%, normal systolic function; mild MR (ordered for mitral valve disease 424.0)    Outpatient Medications Prior to Visit  Medication Sig Dispense Refill  . Acetaminophen (TYLENOL) 325 MG CAPS Take by mouth as needed.    [name] albuterol (PROVENTIL HFA;VENTOLIN HFA) 108 (90 Base) MCG/ACT inhaler Inhale 2 puffs into the lungs every 6 (six) hours as needed for wheezing or shortness of breath. 1 Inhaler 5  . aspirin 81 MG tablet Take 81 mg by mouth daily.    . budesonide-formoterol (SYMBICORT) 160-4.5 MCG/ACT inhaler Take 2 puffs first thing  in am and then another 2 puffs about 12 hours later. 1 Inhaler 5  . carbamazepine (CARBATROL) 200 MG 12 hr capsule Take 1 capsule (200 mg total) by mouth daily. 30 capsule 0  . FLUoxetine (PROZAC) 40 MG capsule Take 1 capsule (40 mg total) by mouth daily. 90 capsule 1  . hydrOXYzine (VISTARIL) 50 MG capsule Take 1 capsule (50 mg total) by mouth daily as needed for anxiety (anxiety and panic). 90 capsule 0  . ipratropium (ATROVENT HFA) 17 MCG/ACT inhaler Inhale 2 puffs into the lungs 4 (four) times daily. 1  Inhaler 11  . ipratropium-albuterol (DUONEB) 0.5-2.5 (3) MG/3ML SOLN Take 3 mLs by nebulization every 4 (four) hours as needed (SOB). 360 mL 1  . omeprazole (PRILOSEC) 20 MG capsule Take 20 mg by mouth 2 (two) times daily.     . prazosin (MINIPRESS) 5 MG capsule Take 5 mg (1 capsule ) at night 90 capsule 1  . QUEtiapine (SEROQUEL) 100 MG tablet Take 1 tablet (100 mg total) by mouth at bedtime. 90 tablet 1   Facility-Administered Medications Prior to Visit  Medication Dose Route Frequency Provider Last Rate Last Dose  . guaiFENesin (MUCINEX) 12 hr tablet 600 mg  600 mg Oral BID Carlyle Basques, MD         Allergies:   Shellfish allergy   Social History   Social History  . Marital status: Divorced    Spouse name: N/A  . Number of children: 2  . Years of education: 12   Occupational History  . Disabled    Social History Main Topics  . Smoking status: Current Every Day Smoker    Packs/day: 1.00    Years: 40.00    Types: Cigarettes  . Smokeless tobacco: Never Used     Comment: Started back and up to 1/2 pack but going to stop  . Alcohol use No  . Drug use: No  . Sexual activity: Not Currently   Other Topics Concern  . None   Social History Narrative  . None     Family History:  The patient's family history includes CAD in his father; COPD in his father; Heart Problems in his mother and other; Heart attack in his maternal grandmother, paternal grandfather, and paternal grandmother; Heart disease in his father; Hypertension in his maternal grandfather.   ROS:   Please see the history of present illness.    ROS All other systems reviewed and are negative.   PHYSICAL EXAM:   VS:  BP 118/78 (BP Location: Right Arm, Patient Position: Sitting, Cuff Size: Normal)   Pulse 69   Ht _0  (1.753 m)   Wt 66.7 kg (147 lb)   BMI 21.71 kg/m    GEN: Well nourished, well developed, in no acute distress  HEENT: normal  Neck: no JVD, carotid bruits, or masses Cardiac: RRR; no  murmurs, rubs, or gallops,no edema , healthy left subclavian pacemaker site Respiratory:  Few scattered rhonchi, but no wheezes, otherwise clear to auscultation bilaterally, normal work of breathing GI: soft, nontender, nondistended, + BS MS: no deformity or atrophy  Skin: warm and dry, no rash Neuro:  Alert and Oriented x 3, Strength and sensation are intact Psych: euthymic mood, full affect  Wt Readings from Last 3 Encounters:  04/13/16 66.7 kg (147 lb)  04/11/16 66.7 kg (147 lb)  01/08/16 68 kg (150 lb)      Studies/Labs Reviewed:   EKG:  EKG is \ ordered today.  The ekg ordered  today demonstrates atrial paced - ventricular sensed rhythm with vertical axis and no ischemic repolarization changes  Recent Labs: 07/28/2015: ALT 13 12/08/2015: BUN 15; Creatinine, Ser 1.11; Hemoglobin 13.3; Platelets 323.0; Potassium 3.9; Sodium 141   Lipid Panel    Component Value Date/Time   CHOL 153 09/04/2012 0839   TRIG 96 09/04/2012 0839   HDL 45 09/04/2012 0839   CHOLHDL 3.4 09/04/2012 0839   VLDL 19 09/04/2012 0839   LDLCALC 89 09/04/2012 0839     ASSESSMENT:    1. Sinus node dysfunction (HCC)   2. Pacemaker - Medtronic 2005, generator change September 2014   3. Paroxysmal atrial fibrillation (HCC)   4. Precordial chest pain      PLAN:  In order of problems listed above:  1. SSS: No symptoms of bradycardia or chronotropic incompetence with current pacemaker settings.  2. PM: Normal pacemaker function. Reeducated him about the importance of routine follow-up. I don't think we'll ever get him to comply with remote transmissions. Will have him come in every 6 months for a device check.. 3. AFib: His episodes of atrial fibrillation are very infrequent (a few times a year year) very brief (under 10 minutes) and asymptomatic. Specific therapy is not indicated. His embolic risk is low. CHADSVasc score zero. Continue aspirin. He will be a very poor candidate for full anticoagulation  because of his compliance issues. 4. Chest pain: This is highly atypical, but he is a heavy smoker. Unfortunately cannot do a plain treadmill stress test since he is paced. We'll schedule for [employer].  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    Medication Adjustments/Labs and Tests Ordered: Current medicines are reviewed at length with the patient today.  Concerns regarding medicines are outlined above.  Medication changes, Labs and Tests ordered today are listed in the Patient Instructions below. Patient Instructions  Dr Sallyanne Kuster recommends that you continue on your current medications as directed. Please refer to the Current Medication list given to you today.  Your physician has requested that you have a lexiscan myoview. For further information please visit [email or web address]. Please follow instruction sheet, as given.  Your physician recommends that you schedule a follow-up appointment in 6 months with a pacemaker check. You will receive a reminder letter in the mail two months in advance. If you don't receive a letter, please call our office to schedule the follow-up appointment.  If you need a refill on your cardiac medications before your next appointment, please call your pharmacy.      Signed, Sanda Klein, MD  04/13/2016 12:42 PM    [address], [place], [place]  [address] Phone: [phone]; Fax: [phone]

## 2016-04-13 ENCOUNTER — Ambulatory Visit (INDEPENDENT_AMBULATORY_CARE_PROVIDER_SITE_OTHER): Payer: Medicare HMO | Admitting: Psychiatry

## 2016-04-13 ENCOUNTER — Ambulatory Visit (INDEPENDENT_AMBULATORY_CARE_PROVIDER_SITE_OTHER): Payer: Medicare HMO | Admitting: Cardiovascular Disease

## 2016-04-13 ENCOUNTER — Encounter (HOSPITAL_COMMUNITY): Payer: Self-pay | Admitting: Psychiatry

## 2016-04-13 ENCOUNTER — Encounter: Payer: Self-pay | Admitting: Cardiovascular Disease

## 2016-04-13 VITALS — BP 118/78 | HR 69 | Ht 69.0 in | Wt 147.0 lb

## 2016-04-13 DIAGNOSIS — F4310 Post-traumatic stress disorder, unspecified: Secondary | ICD-10-CM | POA: Diagnosis not present

## 2016-04-13 DIAGNOSIS — I480 Paroxysmal atrial fibrillation: Secondary | ICD-10-CM

## 2016-04-13 DIAGNOSIS — F331 Major depressive disorder, recurrent, moderate: Secondary | ICD-10-CM

## 2016-04-13 DIAGNOSIS — I495 Sick sinus syndrome: Secondary | ICD-10-CM | POA: Diagnosis not present

## 2016-04-13 DIAGNOSIS — Z79899 Other long term (current) drug therapy: Secondary | ICD-10-CM | POA: Diagnosis not present

## 2016-04-13 DIAGNOSIS — Z950 Presence of cardiac pacemaker: Secondary | ICD-10-CM | POA: Diagnosis not present

## 2016-04-13 DIAGNOSIS — Z7982 Long term (current) use of aspirin: Secondary | ICD-10-CM | POA: Diagnosis not present

## 2016-04-13 DIAGNOSIS — F17210 Nicotine dependence, cigarettes, uncomplicated: Secondary | ICD-10-CM | POA: Diagnosis not present

## 2016-04-13 DIAGNOSIS — R072 Precordial pain: Secondary | ICD-10-CM | POA: Diagnosis not present

## 2016-04-13 MED ORDER — PRAZOSIN HCL 5 MG PO CAPS: ORAL_CAPSULE | ORAL | 1 refills | Status: DC

## 2016-04-13 MED ORDER — QUETIAPINE FUMARATE 200 MG PO TABS: 200.0000 mg | ORAL_TABLET | Freq: Every day | ORAL | 0 refills | Status: DC

## 2016-04-13 MED ORDER — FLUOXETINE HCL 40 MG PO CAPS: 40.0000 mg | ORAL_CAPSULE | Freq: Every day | ORAL | 1 refills | Status: DC

## 2016-04-13 MED ORDER — QUETIAPINE FUMARATE 50 MG PO TABS: 50.0000 mg | ORAL_TABLET | ORAL | 0 refills | Status: DC

## 2016-04-13 NOTE — Patient Instructions (Signed)
Increase Seroquel to 200 mg at night (new size tablet)  Take Seroquel 50 mg in the morning for anxiety and mood  Continue the other medications as prescribed

## 2016-04-13 NOTE — Patient Instructions (Signed)
Dr Royann Shivers recommends that you continue on your current medications as directed. Please refer to the Current Medication list given to you today.  Your physician has requested that you have a lexiscan myoview. For further information please visit [email or web address] Please follow instruction sheet, as given.  Your physician recommends that you schedule a follow-up appointment in 6 months with a pacemaker check. You will receive a reminder letter in the mail two months in advance. If you don't receive a letter, please call our office to schedule the follow-up appointment.  If you need a refill on your cardiac medications before your next appointment, please call your pharmacy.

## 2016-04-13 NOTE — Assessment & Plan Note (Signed)
-   reports quit smokng 06/2013> resumed by ov 04/11/2016   - Spirometry 08/19/2013  FEV1 1.85 ( 47%) ratio 44  - Spirometry 12/08/2015   FEV1 1.53 (43%)  Ratio 40 off maint rx  - 12/08/2015  After extensive coaching HFA effectiveness =    75% > start symb 160/atrovent qid   - Allergy profile 12/08/15  >  Eos 0.0 /  IgE  347  RAST pos dust/ mold - Alpha One AT screen 12/08/15 > MM/ levels ok    - 04/11/2016  After extensive coaching HFA effectiveness =    90% from a baseline of 75%    DDX of  difficult airways management almost all start with A and  include Adherence, Ace Inhibitors, Acid Reflux, Active Sinus Disease, Alpha 1 Antitripsin deficiency, Anxiety masquerading as Airways dz,  ABPA,  Allergy(esp in young), Aspiration (esp in elderly), Adverse effects of meds,  Active smokers, A bunch of PE's (a small clot burden can't cause this syndrome unless there is already severe underlying pulm or vascular dz with poor reserve) plus two Bs  = Bronchiectasis and Beta blocker use..and one C= CHF   Adherence is always the initial "prime suspect" and is a multilayered concern that requires a "trust but verify" approach in every patient - starting with knowing how to use medications, especially inhalers, correctly, keeping up with refills and understanding the fundamental difference between maintenance and prns vs those medications only taken for a very short course and then stopped and not refilled.  - return with all meds in hand using a trust but verify approach to confirm accurate Medication  Reconciliation The principal here is that until we are certain that the  patients are doing what we've asked, it makes no sense to ask them to do more.  - see hfa teaching - Formulary restrictions will be an ongoing challenge for the forseable future and I would be happy to pick an alternative if the pt will first  provide me a list of them but pt  will need to return here for training for any new device that is required eg  dpi vs hfa vs respimat.    In meantime we can always provide samples so the patient never runs out of any needed respiratory medications.   Active smoking (see separate a/p)   ? Allergy > reviewed avoidance to documented allergens  ? Anxiety/anxiety  > usually at the bottom of this list of usual suspects but should be much higher on this pt's based on H and P and may  Be interfering with adherence to recs:  note already on psychotropics >  Follow up per Primary Care planned     I had an extended discussion with the patient reviewing all relevant studies completed to date and  lasting 15 to 20 minutes of a 25 minute visit    Each maintenance medication was reviewed in detail including most importantly the difference between maintenance and prns and under what circumstances the prns are to be triggered using an action plan format that is not reflected in the computer generated alphabetically organized AVS.    Please see AVS for specific instructions unique to this visit that I personally wrote and verbalized to the the pt in detail and then reviewed with pt  by my nurse highlighting any  changes in therapy recommended at today's visit to their plan of care.
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
## 2016-04-13 NOTE — Assessment & Plan Note (Signed)

## 2016-04-14 ENCOUNTER — Telehealth (HOSPITAL_COMMUNITY): Payer: Self-pay

## 2016-04-14 ENCOUNTER — Other Ambulatory Visit: Payer: Self-pay | Admitting: Cardiovascular Disease

## 2016-04-14 NOTE — Telephone Encounter (Signed)
Encounter complete. 

## 2016-04-19 ENCOUNTER — Ambulatory Visit (HOSPITAL_COMMUNITY)
Admission: RE | Admit: 2016-04-19 | Discharge: 2016-04-19 | Disposition: A | Discharge status: Home / Self Care | Payer: Medicare HMO | Source: Ambulatory Visit | Attending: Cardiology | Admitting: Cardiology

## 2016-04-19 DIAGNOSIS — R072 Precordial pain: Secondary | ICD-10-CM | POA: Diagnosis not present

## 2016-04-19 LAB — MYOCARDIAL PERFUSION IMAGING
CHL CUP RESTING HR STRESS: 63 {beats}/min
LV sys vol: 73 mL
LVDIAVOL: 142 mL (ref 62–150)
NUC STRESS TID: 1.08
Peak HR: 88 {beats}/min
SDS: 2
SRS: 2
SSS: 4

## 2016-04-19 MED ORDER — REGADENOSON 0.4 MG/5ML IV SOLN
0.4000 mg | Freq: Once | INTRAVENOUS | Status: AC
Start: 2016-04-19 — End: 2016-04-19
  Administered 2016-04-19: 0.4 mg via INTRAVENOUS

## 2016-04-19 MED ORDER — AMINOPHYLLINE 25 MG/ML IV SOLN
75.0000 mg | Freq: Once | INTRAVENOUS | Status: AC
  Administered 2016-04-19: 75 mg via INTRAVENOUS

## 2016-04-19 MED ORDER — TECHNETIUM TC 99M TETROFOSMIN IV KIT
10.4000 | PACK | Freq: Once | INTRAVENOUS | Status: AC | PRN
  Administered 2016-04-19: 10.4 via INTRAVENOUS
  Filled 2016-04-19: qty 11

## 2016-04-19 MED ORDER — TECHNETIUM TC 99M TETROFOSMIN IV KIT
31.2000 | PACK | Freq: Once | INTRAVENOUS | Status: AC | PRN
  Administered 2016-04-19: 31.2 via INTRAVENOUS
  Filled 2016-04-19: qty 32

## 2016-04-21 ENCOUNTER — Emergency Department (HOSPITAL_COMMUNITY)
Admission: EM | Admit: 2016-04-21 | Discharge: 2016-04-22 | Disposition: A | Discharge status: Home / Self Care | Payer: Medicare HMO | Attending: Emergency Medicine | Admitting: Emergency Medicine

## 2016-04-21 DIAGNOSIS — T7840XA Allergy, unspecified, initial encounter: Secondary | ICD-10-CM | POA: Insufficient documentation

## 2016-04-21 DIAGNOSIS — Z950 Presence of cardiac pacemaker: Secondary | ICD-10-CM | POA: Insufficient documentation

## 2016-04-21 DIAGNOSIS — J449 Chronic obstructive pulmonary disease, unspecified: Secondary | ICD-10-CM | POA: Diagnosis not present

## 2016-04-21 DIAGNOSIS — Z7982 Long term (current) use of aspirin: Secondary | ICD-10-CM | POA: Insufficient documentation

## 2016-04-21 DIAGNOSIS — F17210 Nicotine dependence, cigarettes, uncomplicated: Secondary | ICD-10-CM | POA: Diagnosis not present

## 2016-04-21 DIAGNOSIS — Z79899 Other long term (current) drug therapy: Secondary | ICD-10-CM | POA: Diagnosis not present

## 2016-04-21 IMAGING — CT CT CHEST W/O CM
2 of 3 series · 15 of 36 positions shown, 18 images · non-contrast
Comparison: 06/17/2013

CLINICAL DATA: Non resolving pneumonia

EXAM:
CT CHEST WITHOUT CONTRAST
TECHNIQUE: Multidetector CT imaging of the chest was performed following the
standard protocol without IV contrast. Sagittal and coronal MPR
images reconstructed from axial data set.

[Series 2: thorax 5.0 i31f 1 · axial · 0.64mm/px · z∈[+1094,+1398]mm · 12 of 73 slices shown, 15 images]
[im 6/73  mediastinal]
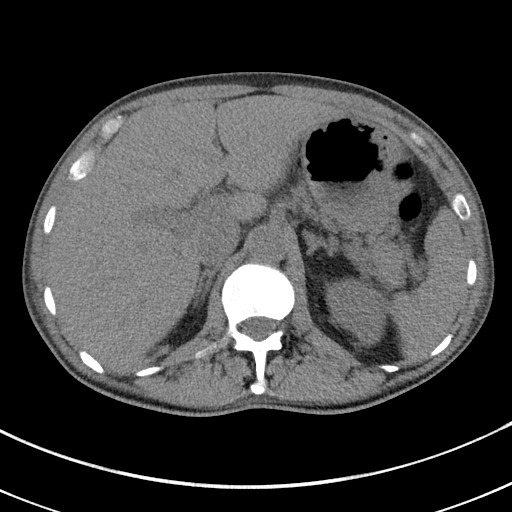
[im 6/73  lung]
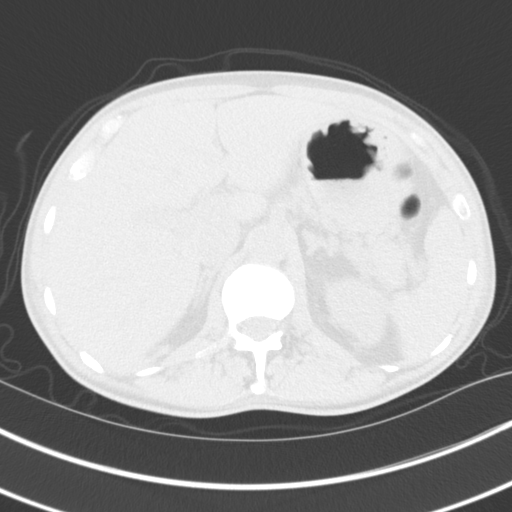
[im 11/73  lung]
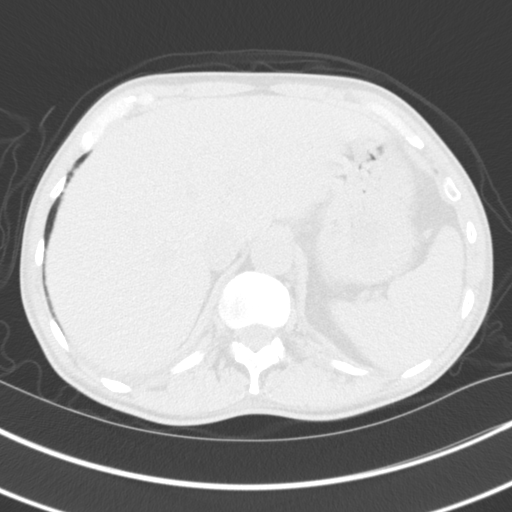
[im 17/73  lung]
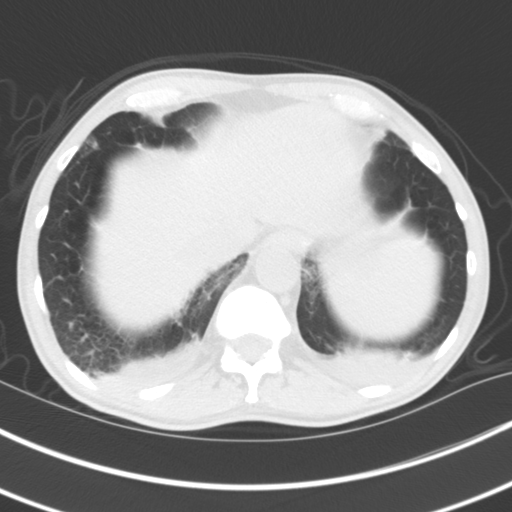
[im 22/73  lung]
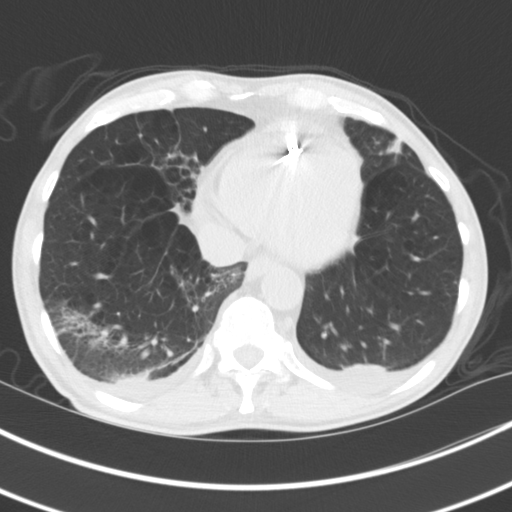
[im 27/73  mediastinal]
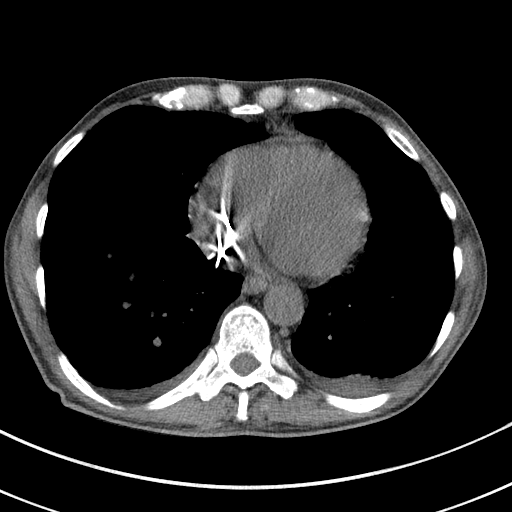
[im 27/73  lung]
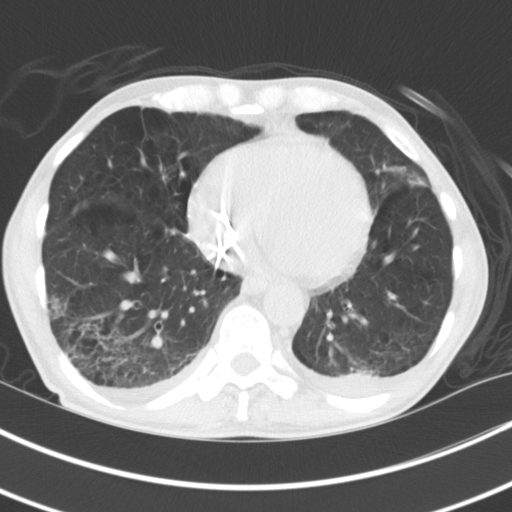
[im 33/73  lung]
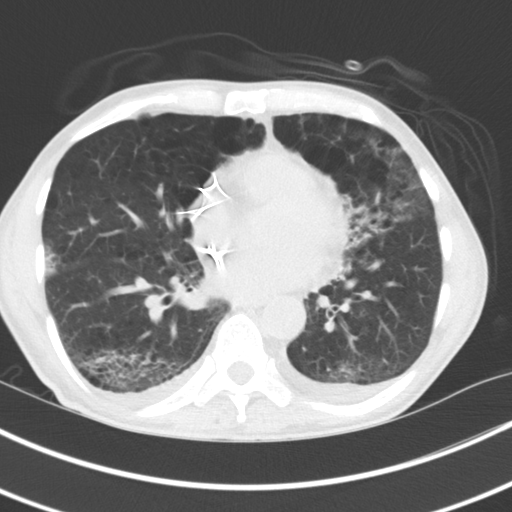
[im 41/73  lung]
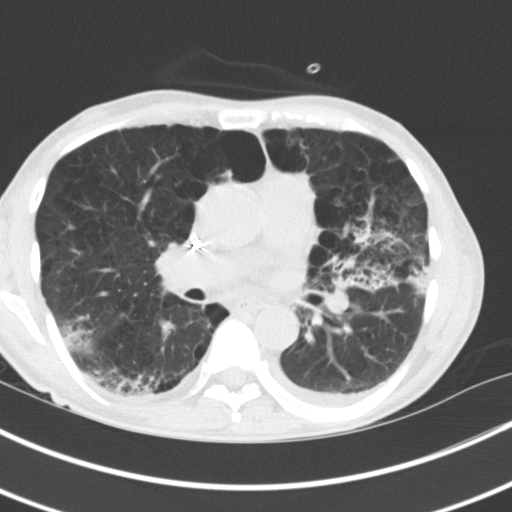
[im 46/73  lung]
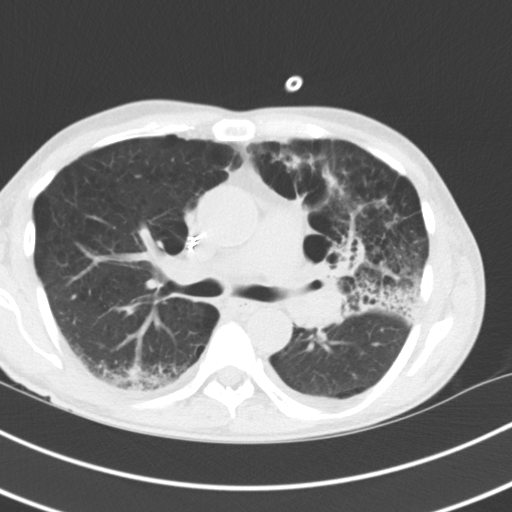
[im 51/73  mediastinal]
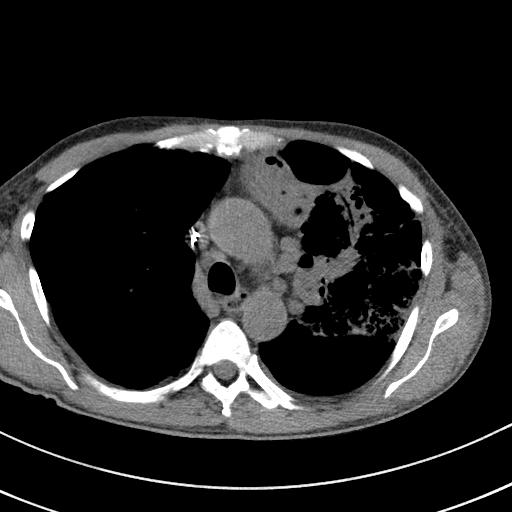
[im 51/73  lung]
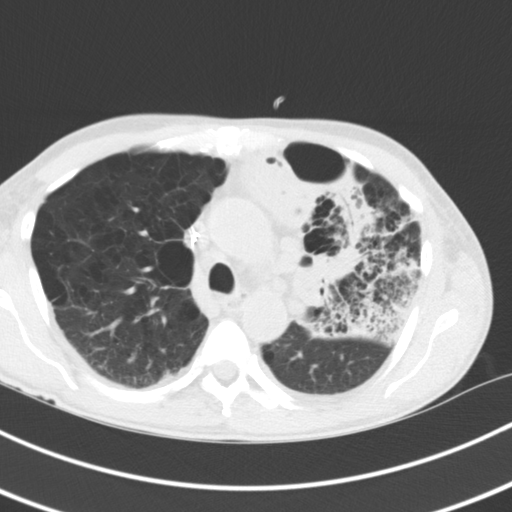
[im 57/73  lung]
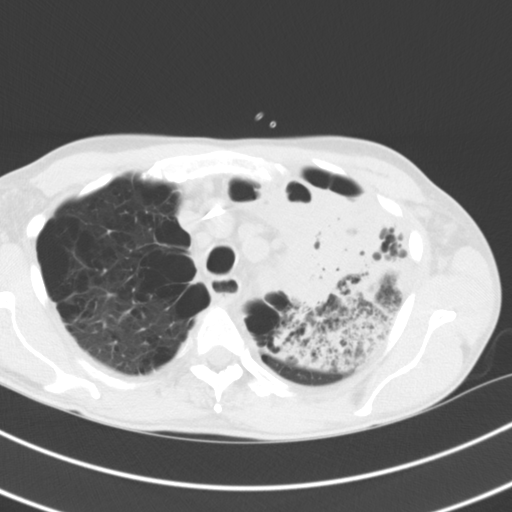
[im 62/73  lung]
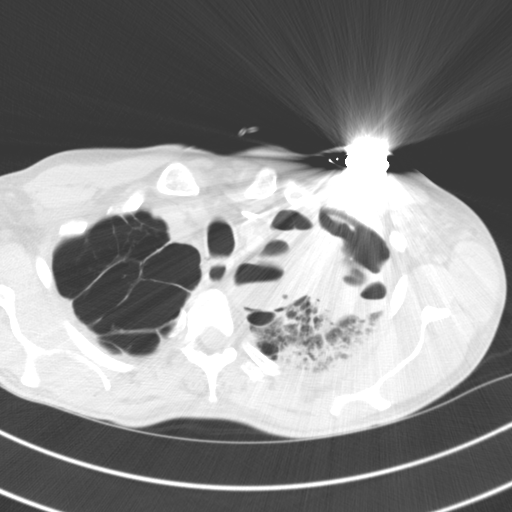
[im 67/73  lung]
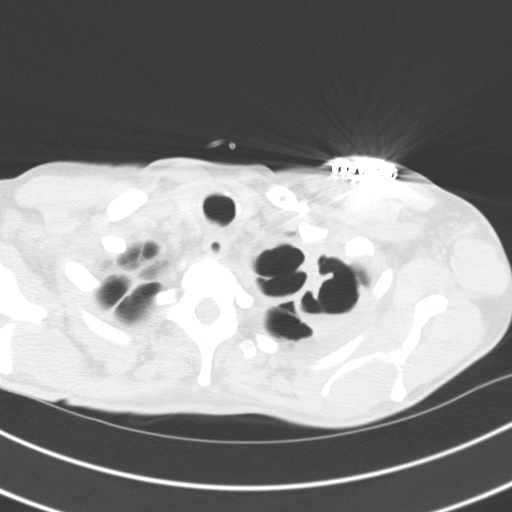

[Series 5: coronal · coronal · 0.71mm/px · 3 of 100 slices shown]
[im 20/100  lung]
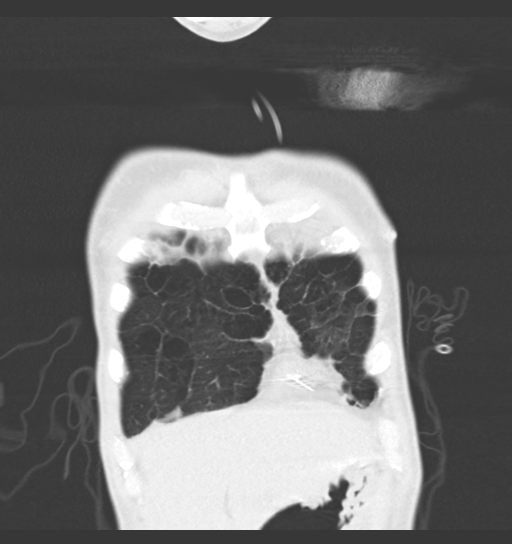
[im 40/100  lung]
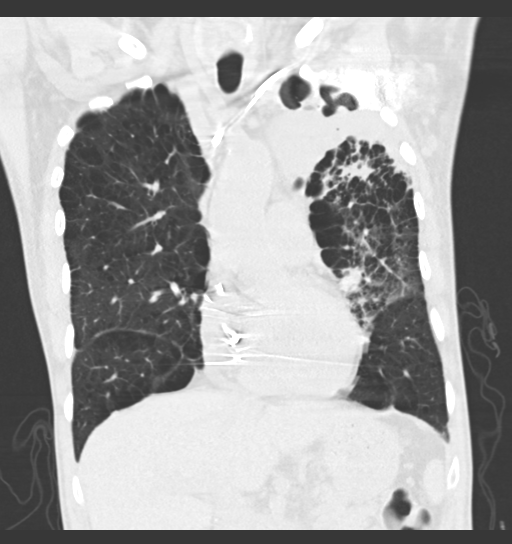
[im 60/100  lung]
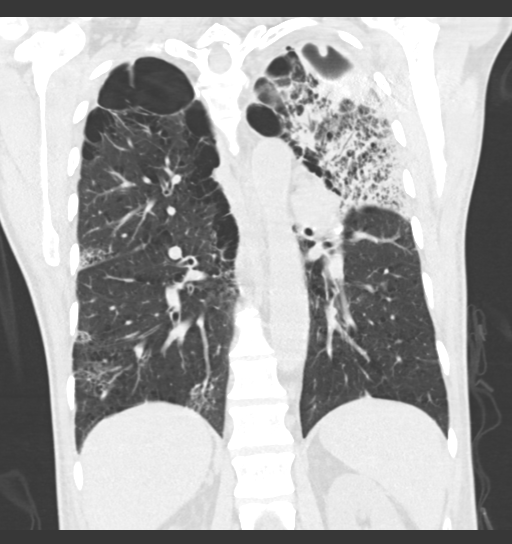

[15 of 36 positions shown; findings below may reference images not displayed]

FINDINGS: LEFT subclavian transvenous pacemaker leads project at RIGHT atrium
and RIGHT ventricle.

Minimal pericardial effusion.

Upper normal size of ascending thoracic aorta 3.8 x 3.8 cm image 31.

Small BILATERAL pleural effusions.

Post cholecystectomy.

Remaining visualized portions of upper abdomen unremarkable.

Severe emphysematous changes with bullae at RIGHT apex.

Large cavitary foci LEFT upper lobe identified containing small
air-fluid levels and wall thickening.

Extensive airspace infiltrate in LEFT upper lobe and lingula,
slightly increased.

Mild infiltrate at RIGHT lower lobe and minimally in RIGHT middle
lobe, new since previous exam.

No discrete pulmonary mass/nodule.

Osseous structures unremarkable.
IMPRESSION: Extensive LEFT upper lobe and lingular consolidation consistent with
pneumonia, slightly increased since previous exam.

Scattered small air-fluid levels within bullae with wall thickening
at LEFT upper lobe.

Less severe infiltrate in RIGHT lower lobe and minimally RIGHT
middle lobe.

Small BILATERAL pleural effusions and minimal pericardial effusion.

## 2016-04-21 MED ORDER — SODIUM CHLORIDE 0.9 % IV SOLN
Freq: Once | INTRAVENOUS | Status: AC
  Administered 2016-04-21: 22:00:00 via INTRAVENOUS

## 2016-04-21 MED ORDER — FAMOTIDINE IN NACL 20-0.9 MG/50ML-% IV SOLN
20.0000 mg | Freq: Once | INTRAVENOUS | Status: AC
  Administered 2016-04-21: 20 mg via INTRAVENOUS

## 2016-04-21 MED ORDER — METHYLPREDNISOLONE SODIUM SUCC 125 MG IJ SOLR
125.0000 mg | Freq: Once | INTRAMUSCULAR | Status: AC
  Administered 2016-04-21: 125 mg via INTRAVENOUS

## 2016-04-21 MED ORDER — DIPHENHYDRAMINE HCL 50 MG/ML IJ SOLN
25.0000 mg | Freq: Once | INTRAMUSCULAR | Status: AC
  Administered 2016-04-21: 25 mg via INTRAVENOUS

## 2016-04-21 MED ORDER — EPINEPHRINE 0.3 MG/0.3ML IJ SOAJ
0.3000 mg | Freq: Once | INTRAMUSCULAR | Status: AC
  Administered 2016-04-21: 0.3 mg via INTRAMUSCULAR

## 2016-04-21 NOTE — ED Triage Notes (Signed)
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 Patient states has eaten shrimp without issue in the past. Patient has rash on bilateral arms and trunk of body. Patient lips swollen with mild angioedema. Lungs clear throughout. Patient nauseated and dry heaving.   0.3mg  of IM epi administered,  of benadryl and 164m of solumederol upon arrival to ER room.

## 2016-04-21 NOTE — ED Triage Notes (Signed)
Pt arrives via POV with family; pt states he is allergic to shell fish and at baby shrimp from [facility]; Pt arrives with swollen lips with blotchy skin and hives on trunk and SOB; pt c/o chest pain, relayed to MD on arrival to Trauma A; Pt was transferred to trauma A; IV placed in triage;

## 2016-04-21 NOTE — ED Provider Notes (Signed)
[facility] DEPT Provider Note   CSN: [id number] Arrival date & time: 04/21/16  2119     History   Chief Complaint Chief Complaint  Patient presents with  . Allergic Reaction    HPI Michael [name] is a 56 y.o. male.  The history is provided by the patient, a parent and medical records.  Allergic Reaction  Presenting symptoms: itching, rash and swelling   Presenting symptoms: no difficulty swallowing and no wheezing   Severity:  Severe Prior allergic episodes:  Food/nut allergies (shellfish) Context comment:  Ate popcorn shrimp from a different place than normal Relieved by:  None tried Worsened by:  Nothing Ineffective treatments:  None tried   Past Medical History:  Diagnosis Date  . Anginal pain ([place])   . Bradycardia   . Chronic chest pain   . COPD (chronic obstructive pulmonary disease) ([place])   . Depression   . History of nuclear stress test 06/14/2011   lexiscan; mild perfusion defect in basal inferosetpal & apical inferior region; negative for ischemia   . Pacemaker   . Pneumonia   . 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 Seizure disorder ([place])   . Shortness of breath   . Tobacco abuse     Patient Active Problem List   Diagnosis Date Noted  . Cigarette smoker 04/13/2016  . PTSD (post-traumatic stress disorder) 01/27/2016  . Major depressive disorder, recurrent episode, moderate ([place]) 01/27/2016  . Chronic respiratory failure with hypercapnia ([place]) 12/09/2015  . Paroxysmal atrial fibrillation ([place]) 10/22/2013  . Sinus node dysfunction ([place]) 10/22/2013  . 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 COPD GOLD III  07/08/2013  . COPD exacerbation ([place]) 06/19/2013  . CAP (community acquired pneumonia) 06/14/2013  . Protein-calorie malnutrition, severe ([place]) 06/14/2013  . Weight loss 09/14/2012  . Symptomatic bradycardia 09/06/2012  . Pacemaker - Medtronic 2005, generator change September 2014 09/06/2012     Past Surgical History:  Procedure Laterality Date  . CARDIAC CATHETERIZATION  2002, 2003   normal coronaries  . EYE SURGERY  [age] y.o.  . GALLBLADDER SURGERY  2003  . HAND SURGERY  1998   x3  . PACEMAKER INSERTION  04/23/2003   Medtronic Kappa; [facility] - symptomatic bradycardia  . PERMANENT PACEMAKER GENERATOR CHANGE N/A 09/06/2012   Procedure: PERMANENT PACEMAKER GENERATOR CHANGE;  Surgeon: Sanda Klein, MD;  Location: [facility] CATH LAB;  Service: Cardiovascular;  Laterality: N/A;  . TRANSTHORACIC ECHOCARDIOGRAM  05/14/2012   EF 44-92%, normal systolic function; mild MR (ordered for mitral valve disease 424.0)       Home Medications    Prior to Admission medications   Medication Sig Start Date End Date Taking? Authorizing Provider  Acetaminophen (TYLENOL) 325 MG CAPS Take by mouth as needed.    Historical Provider, MD  albuterol (PROVENTIL HFA;VENTOLIN HFA) 108 (90 Base) MCG/ACT inhaler Inhale 2 puffs into the lungs every 6 (six) hours as needed for wheezing or shortness of breath. 04/12/16   Tanda Rockers, MD  aspirin 81 MG tablet Take 81 mg by mouth daily.    Historical Provider, MD  budesonide-formoterol (SYMBICORT) 160-4.5 MCG/ACT inhaler Take 2 puffs first thing in am and then another 2 puffs about 12 hours later. 04/12/16   Tanda Rockers, MD  carbamazepine (CARBATROL) 200 MG 12 hr capsule Take 1 capsule (200 mg total) by mouth daily. 01/27/16   Norman Clay, MD  FLUoxetine (PROZAC) 40 MG capsule Take 1 capsule (40 mg  total) by mouth daily. 04/13/16   Aundra Dubin, MD  hydrOXYzine (VISTARIL) 50 MG capsule Take 1 capsule (50 mg total) by mouth daily as needed for anxiety (anxiety and panic). 03/09/16   Aundra Dubin, MD  ipratropium (ATROVENT HFA) 17 MCG/ACT inhaler Inhale 2 puffs into the lungs 4 (four) times daily. 12/08/15   Tanda Rockers, MD  ipratropium-albuterol (DUONEB) 0.5-2.5 (3) MG/3ML SOLN Take 3 mLs by nebulization every 4 (four) hours as  needed (SOB). 09/02/13   Carlyle Basques, MD  omeprazole (PRILOSEC) 20 MG capsule Take 20 mg by mouth 2 (two) times daily.     Historical Provider, MD  prazosin (MINIPRESS) 5 MG capsule Take 5 mg (1 capsule ) at night 04/13/16   Aundra Dubin, MD  QUEtiapine (SEROQUEL) 200 MG tablet Take 1 tablet (200 mg total) by mouth at bedtime. 04/13/16   Aundra Dubin, MD  QUEtiapine (SEROQUEL) 50 MG tablet Take 1 tablet (50 mg total) by mouth every morning. 04/13/16 04/13/17  Aundra Dubin, MD    Family History Family History  Problem Relation Age of Onset  . Heart Problems Mother     MVP  . CAD Father   . COPD Father   . Heart disease Father   . Heart Problems Other     aunts x 2 died of heart problems   . Heart attack Maternal Grandmother   . Hypertension Maternal Grandfather   . Heart attack Paternal Grandmother   . Heart attack Paternal Grandfather     Social History Social History  Substance Use Topics  . Smoking status: Current Every Day Smoker    Packs/day: 1.00    Years: 40.00    Types: Cigarettes  . Smokeless tobacco: Never Used     Comment: Started back and up to 1/2 pack but going to stop  . Alcohol use No     Allergies   Shellfish allergy   Review of Systems Review of Systems  Constitutional: Negative for chills and fever.  HENT: Positive for facial swelling. Negative for ear pain, sore throat, trouble swallowing and voice change.   Eyes: Positive for itching. Negative for pain and visual disturbance.  Respiratory: Negative for cough, shortness of breath and wheezing.   Cardiovascular: Negative for chest pain and palpitations.  Gastrointestinal: Positive for abdominal pain. Negative for vomiting.  Genitourinary: Negative for dysuria and hematuria.  Musculoskeletal: Negative for arthralgias and back pain.  Skin: Positive for itching and rash. Negative for color change.  Neurological: Negative for seizures and syncope.  All other systems reviewed and  are negative.    Physical Exam Updated Vital Signs BP 130/82 (BP Location: Right Arm)   Pulse 62   Temp 97.9 F (36.6 C) (Oral)   Resp (!) 22   Ht 5' 9"  (1.753 m)   Wt 64.9 kg   SpO2 95%   BMI 21.12 kg/m   Physical Exam  Constitutional: He appears well-developed and well-nourished.  HENT:  Head: Normocephalic and atraumatic.  Mild b/l eyelid & lip swelling, no tongue swelling or signs of airway compromise  Eyes: Conjunctivae are normal.  Neck: Neck supple.  Cardiovascular: Normal rate and regular rhythm.   No murmur heard. Pulmonary/Chest: Effort normal and breath sounds normal. No stridor. No respiratory distress. He has no wheezes.  Abdominal: Soft. There is no tenderness.  Musculoskeletal: He exhibits no edema.  Neurological: He is alert.  Skin: Skin is warm and dry.  Diffuse urticarial rash  Psychiatric: He  has a normal mood and affect.  Nursing note and vitals reviewed.    ED Treatments / Results  Labs (all labs ordered are listed, but only abnormal results are displayed) Labs Reviewed - No data to display  EKG  EKG Interpretation None       Radiology No results found.  Procedures Procedures (including critical care time)  Medications Ordered in ED Medications  EPINEPHrine (EPI-PEN) injection 0.3 mg (not administered)  diphenhydrAMINE (BENADRYL) injection 25 mg (not administered)  methylPREDNISolone sodium succinate (SOLU-MEDROL) 125 mg/2 mL injection 125 mg (not administered)  famotidine (PEPCID) IVPB 20 mg premix (not administered)  0.9 %  sodium chloride infusion (not administered)     Initial Impression / Assessment and Plan / ED Course  I have reviewed the triage vital signs and the nursing notes.  Pertinent labs & imaging results that were available during my care of the patient were reviewed by me and considered in my medical decision making (see chart for details).    Pt with h/o shellfish allergy presents with anaphylaxis after  eating shrimp. Says he often eats popcorn shrimp, but tonight ate them from a different restaurant; later on he started complaining of diffuse itching, pain, and facial swelling.  VS & exam as above. Anaphylactic reaction w/o signs of airway compromise; parents say he already looks better than he did initially. Epinephrine, Benadryl, Solumedrol, Zantac given on presentation.  On re-evaluation after 3hrs, Pt feeling better & symptoms have improved greatly.  Explained all results to the Parents. Will discharge the Pt home with prescription for Epi-pen & steroids. Recommending follow-up with PCP. ED return precautions provided. Parents acknowledged understanding of, and concurrence with the plan. All questions answered to their satisfaction. In stable condition at the time of discharge.   Final Clinical Impressions(s) / ED Diagnoses   Final diagnoses:  Allergic reaction, initial encounter    New Prescriptions New Prescriptions   No medications on file     Jenny Reichmann, MD 04/22/16 [id number]    Gwenyth Allegra Tegeler, MD 04/27/16 1002

## 2016-04-22 ENCOUNTER — Telehealth: Payer: Self-pay

## 2016-04-22 DIAGNOSIS — Z13220 Encounter for screening for lipoid disorders: Secondary | ICD-10-CM

## 2016-04-22 MED ORDER — PREDNISONE 20 MG PO TABS: 40.0000 mg | ORAL_TABLET | Freq: Every day | ORAL | 0 refills | Status: AC

## 2016-04-22 MED ORDER — EPINEPHRINE 0.3 MG/0.3ML IJ SOAJ: 0.3000 mg | Freq: Once | INTRAMUSCULAR | 0 refills | Status: AC

## 2016-04-22 MED ORDER — ISOSORBIDE MONONITRATE ER 30 MG PO TB24: 30.0000 mg | ORAL_TABLET | Freq: Every day | ORAL | 3 refills | Status: DC

## 2016-04-22 NOTE — Telephone Encounter (Signed)
-----   Message from Thurmon Fair, MD sent at 04/19/2016  5:04 PM EDT ----- Very mild abnormalities on the stress test. I do not think we need to rush into a heart cath, but make sure he takes his aspirin, please order a lipid profile (unless we can get a recent one from PCP) and remind him how important it is to quit smoking. Add isosorbide mononitrate 30 mg daily to see if chest pain symptoms improve. Have him come back in 3 months with in office device check. Please ask him if he is still taking prazosin.

## 2016-04-22 NOTE — Telephone Encounter (Signed)
Results reviewed with patient's mother, [name], okay per DPR.  Mother verbalized understanding and agreed with plan. Follow-up appointment made for 08/01/16 at 1:20p. Lab orders, paperwork to be mailed to patient. Rx for Isosorbide sent to patient's preferred pharmacy electronically.

## 2016-05-26 ENCOUNTER — Telehealth: Payer: Self-pay | Admitting: Internal Medicine

## 2016-05-26 NOTE — Telephone Encounter (Signed)
Spoke with pt's mother (dpr on file), states that [facility] no [facility] able to get [facility]vent at the pharmacy. [facility] [address] pharmacist verified that Atrovent is on backorder, per manufacturer will be available early-mid June.   We have no Atrovent samples in office to supply pt.  MW please advise on temporary alternative inhaler for pt while Atrovent is on backorder.  Thanks!   04/11/16 AVS:  Patient Instructions   For cough, 1st stop smoking  / mucinex dm 1200 mg every 12 hours as needed    Work hard on stopping all smoking before smoking stops you    Work on Cabin crewperfecting  inhaler technique:  relax and gently blow all the way out then take a nice smooth deep breath back in, triggering the inhaler at same time you start breathing in.  Hold for up to 5 seconds if you can. Blow out thru nose. Rinse and gargle with water when done       Continue symbicort 160 Take 2 puffs first thing in am and then another 2 puffs about 12 hours later and atovent is 2 pffs 2 x daily and additional doses as needed        Plan B = Backup Only use your albuterol (Proair= RED) instead of proventil as a rescue medication to be used if you can't catch your breath by resting or doing a relaxed purse lip breathing pattern.  - The less you use it, the better it will work when you need it. - Ok to use the inhaler up to 2 puffs  every 4 hours if you must but call for appointment if use goes up over your usual need - Don't leave home without it !!  (think of it like the spare tire for your car)    Plan C = Crisis - only use your albuterol nebulizer if you first try Plan B and it fails to help > ok to use the nebulizer up to every 4 hours but if start needing it regularly call for immediate appointment     Please remember to go to the  x-ray department downstairs in the basement  for your tests - we will call you with the results when they are available.     Please  schedule a follow up visit in 3 months but call sooner if needed with all meds in hand

## 2016-05-26 NOTE — Telephone Encounter (Signed)
duoneb has atrovent in it so just use it up to qid prn

## 2016-05-27 NOTE — Telephone Encounter (Signed)
Pt's mother, Steward DroneBrenda is aware of MW's recommendations and voiced her understanding. Nothing further needed.

## 2016-06-08 ENCOUNTER — Ambulatory Visit (HOSPITAL_COMMUNITY): Payer: Self-pay | Admitting: Psychiatry

## 2016-07-11 ENCOUNTER — Ambulatory Visit (INDEPENDENT_AMBULATORY_CARE_PROVIDER_SITE_OTHER): Payer: Medicare HMO | Admitting: Internal Medicine

## 2016-07-11 ENCOUNTER — Encounter: Payer: Self-pay | Admitting: Internal Medicine

## 2016-07-11 ENCOUNTER — Ambulatory Visit (INDEPENDENT_AMBULATORY_CARE_PROVIDER_SITE_OTHER)
Admission: RE | Admit: 2016-07-11 | Discharge: 2016-07-11 | Disposition: A | Discharge status: Home / Self Care | Payer: Medicare HMO | Source: Ambulatory Visit | Attending: Internal Medicine | Admitting: Internal Medicine

## 2016-07-11 VITALS — BP 90/60 | HR 82 | Ht 69.0 in | Wt 144.0 lb

## 2016-07-11 DIAGNOSIS — J449 Chronic obstructive pulmonary disease, unspecified: Secondary | ICD-10-CM

## 2016-07-11 DIAGNOSIS — Z2239 Carrier of other specified bacterial diseases: Secondary | ICD-10-CM

## 2016-07-11 DIAGNOSIS — F17210 Nicotine dependence, cigarettes, uncomplicated: Secondary | ICD-10-CM | POA: Diagnosis not present

## 2016-07-11 DIAGNOSIS — J9612 Chronic respiratory failure with hypercapnia: Secondary | ICD-10-CM

## 2016-07-11 NOTE — Progress Notes (Signed)
Spoke with pt and notified of results per Dr. Wert. Pt verbalized understanding and denied any questions. 

## 2016-07-11 NOTE — Progress Notes (Signed)
Subjective:     Patient ID: Michael [name], male   DOB: 1960-04-07,     MRN: [id number]    Brief patient profile:  21 yowm active smoker referred to pulmonary clinic 12/08/2015 by Dr  Ilsa Iha with GOLD III copd documented 08/19/13      History of Present Illness  08/19/13 ov / Michael [name] GOLD III copd  rec Continue symbicort 160 Take 2 puffs first thing in am and then another 2 puffs about 12 hours later.  Only use your  atrovent as a rescue medication Work on inhaler technique:     12/08/2015  Re-establish/ extended  Pulmonary office visit/ Michael [name]  maint rx qvar / atrovent prn Chief Complaint  Patient presents with  . Pulm Consult    Pt. has a history of COPD.   breathing gradually worse x sev months / assoc with coughing > mostly white  rx zpak/ prednisone x 12/04/15 no better  Doe = MMRC3 = can't walk 100 yards even at a slow pace at a flat grade s stopping due to sob   rec Plan A = Automatic = Symbicort 160 Take 2 puffs first thing in am and then another 2 puffs about 12 hours later.                                      Atrovent 2 puffs four times day  Plan B = Backup Only use your albuterol (Ventolin)as a rescue medication  Plan C = Crisis - only use your albuterol nebulizer if you first try Plan B and it fails to help > ok to use the nebulizer up to every 4 hours but if start needing it regularly call for immediate appointment Please remember to go to the lab and x-ray department downstairs for your tests - we will call you with the results when they are available.      01/08/2016  f/u ov/Michael [name] re: copd III/ hypercabic on symb 160 2bid/ atrovent 2qid Chief Complaint  Patient presents with  . Follow-up    Breathing some worse which he relates to cold weather. His cough and wheezing are unchanged. No new co's today. He is using ventolin at least once per day.   only uses ventolin if goes outside in cold/ or going up steps  Rare noct need for saba/no neb form needed at all  sleeps ok flat  most nights s am flares Doe still = MMRC3 rec No change in inhalers Please schedule a follow up visit in 3 months but call sooner if needed with cxr on return      04/11/2016  f/u ov/Michael [name] re: COPD III/ resumed smoking/ symb 160 / atrovent qid / saba  Chief Complaint  Patient presents with  . Follow-up    Seems to be coughing more recently. Cough is prod with clear to "somtimes kinda black" sputum.  He states "the inhalers don't last".  He started smoking again a few months ago 1/2- 1 ppd.   was doing better with cough and ex tol until resumed smoking/ cough worse in am's but clears w/in an hour of stirring  rec For cough, 1st stop smoking  / mucinex dm 1200 mg every 12 hours as needed  Work hard on stopping all smoking before smoking stops you  Work on Musician technique:  Continue symbicort 160 Take 2 puffs first thing in am and then another 2  puffs about 12 hours later and atovent is 2 pffs 2 x daily and additional doses as needed  Plan B = Backup Only use your albuterol (Proair= RED) instead of proventil as a rescue medication  Plan C = Crisis - only use your albuterol nebulizer if you first try Plan B and it fails to help > ok to use the nebulizer up to every 4 hours but if start needing it regularly call for immediate appointment    07/11/2016  f/u ov/Michael [name] re:  GOLD III still smoking maint on symb 160 2bid  Chief Complaint  Patient presents with  . Follow-up    Breathing has improved since the weather has been less humid the past few days. His cough is "worse at times"- prod with clear sputum.  He uses his proair inhaler at least 6 x per day on average.   doe = MMRC3 = can't walk 100 yards even at a slow pace at a flat grade s stopping due to sob    Not using atrovent regularly - has neb but it's "missing a piece" and doesn't know who to call   No obvious day to day or daytime variability or assoc excess/ purulent sputum or mucus plugs or hemoptysis or cp or chest  tightness, subjective wheeze or overt sinus or hb symptoms. No unusual exp hx or h/o childhood pna/ asthma or knowledge of premature birth.  Sleeping ok without nocturnal  or early am exacerbation  of respiratory  c/o's or need for noct saba. Also denies any obvious fluctuation of symptoms with weather or environmental changes or other aggravating or alleviating factors except as outlined above   Current Medications, Allergies, Complete Past Medical History, Past Surgical History, Family History, and Social History were reviewed in [facility] electronic medical record.  ROS  The following are not active complaints unless bolded sore throat, dysphagia, dental problems, itching, sneezing,  nasal congestion or excess/ purulent secretions, ear ache,   fever, chills, sweats, unintended wt loss, classically pleuritic or exertional cp,  orthopnea pnd or leg swelling, presyncope, palpitations, abdominal pain, anorexia, nausea, vomiting, diarrhea  or change in bowel or bladder habits, change in stools or urine, dysuria,hematuria,  rash, arthralgias, visual complaints, headache, numbness, weakness or ataxia or problems with walking or coordination,  change in mood/affect or memory.                              Objective:   Physical Exam    amb haggard thin wm  nad  07/11/2016         144   04/11/2016        147          12/08/15 157 lb 3.2 oz (71.3 kg)  11/04/15 153 lb 12.8 oz (69.8 kg)  08/25/15 160 lb 12.8 oz (72.9 kg)    Vital signs reviewed - Note on arrival 02 sats  97% on RA     HEENT: nl  urbinates, and oropharynx. Nl external ear canals without cough reflex- poor dentition    NECK :  without JVD/Nodes/TM/ nl carotid upstrokes bilaterally   LUNGS: no acc muscle use,  Nl contour chest with scattered somewhat coarse   bilat  insp and exp rhonchi    CV:  RRR  no s3 or murmur or increase in P2, nad no edema   ABD:  soft and nontender with nl inspiratory excursion in the  supine position. No  bruits or organomegaly appreciated, bowel sounds nl  MS:  Nl gait/ ext warm without deformities, calf tenderness, cyanosis or clubbing No obvious joint restrictions   SKIN: warm and dry without lesions    NEURO:  alert, approp, nl sensorium with  no motor or cerebellar deficits apparent.     CXR PA and Lateral:   07/11/2016 :    I personally reviewed images and agree with radiology impression as follows:    No acute abnormality.  Emphysema.  Scarring in the left upper lobe.       Assessment:

## 2016-07-11 NOTE — Patient Instructions (Addendum)
No change in medications   Work harder on not smoking   Please remember to go to the  x-ray department downstairs in the basement  for your tests - we will call you with the results when they are available.      Please schedule a follow up visit in 6  months but call sooner if needed  with all medications /inhalers/ solutions in hand so we can verify exactly what you are taking. This includes all medications from all doctors and over the counters

## 2016-07-13 NOTE — Assessment & Plan Note (Signed)
HCO3 12/08/2015 = 34   Adequate control on present rx, reviewed in detail with pt > no change in rx needed  (no 02 )

## 2016-07-13 NOTE — Assessment & Plan Note (Signed)
>   3 min discussion I reviewed the Fletcher curve with the patient that basically indicates  if you quit smoking when your best day FEV1 is still  preserved (as is only now relatively  the case here)  it is highly unlikely you will progress to more  severe disease and informed the patient there was  no medication on the market that has proven to alter the curve/ its downward trajectory  or the likelihood of progression of their disease(unlike other chronic medical conditions such as atheroclerosis where we do think we can change the natural hx with risk reducing meds)    Therefore stopping smoking and maintaining abstinence is the most important aspect of care, not choice of inhalers or for that matter, doctors.     F/u can be q 6 m now

## 2016-07-13 NOTE — Assessment & Plan Note (Signed)
-   reports quit smokng 06/2013> resumed by ov 04/11/2016   - Spirometry 08/19/2013  FEV1 1.85 ( 47%) ratio 44  - Spirometry 12/08/2015   FEV1 1.53 (43%)  Ratio 40 off maint rx  - 12/08/2015  After extensive coaching HFA effectiveness =    75% > start symb 160/atrovent qid   - Allergy profile 12/08/15  >  Eos 0.0 /  IgE  347  RAST pos dust/ mold - [facility] AT screen 12/08/15 > MM/ levels ok    - 07/11/2016  After extensive coaching HFA effectiveness =    90%   Despite continued smoking/ non-adherent with atrovent and severe underlying airflow obst he is well compensated on present rx so no changes needed  Each maintenance medication was reviewed in detail including most importantly the difference between maintenance and as needed and under what circumstances the prns are to be used.  Please see AVS for specific  Instructions which are unique to this visit and I personally typed out  which were reviewed in detail in writing with the patient and a copy provided.

## 2016-07-25 ENCOUNTER — Other Ambulatory Visit (HOSPITAL_COMMUNITY): Payer: Self-pay | Admitting: Psychiatry

## 2016-07-25 DIAGNOSIS — F4310 Post-traumatic stress disorder, unspecified: Secondary | ICD-10-CM

## 2016-07-25 DIAGNOSIS — F331 Major depressive disorder, recurrent, moderate: Secondary | ICD-10-CM

## 2016-08-01 ENCOUNTER — Encounter: Payer: Self-pay | Admitting: Cardiovascular Disease

## 2016-08-01 ENCOUNTER — Ambulatory Visit (INDEPENDENT_AMBULATORY_CARE_PROVIDER_SITE_OTHER): Payer: Medicare HMO | Admitting: Cardiovascular Disease

## 2016-08-01 VITALS — BP 116/78 | HR 68 | Ht 69.0 in | Wt 142.0 lb

## 2016-08-01 DIAGNOSIS — R9439 Abnormal result of other cardiovascular function study: Secondary | ICD-10-CM | POA: Diagnosis not present

## 2016-08-01 DIAGNOSIS — J449 Chronic obstructive pulmonary disease, unspecified: Secondary | ICD-10-CM

## 2016-08-01 DIAGNOSIS — I480 Paroxysmal atrial fibrillation: Secondary | ICD-10-CM | POA: Diagnosis not present

## 2016-08-01 DIAGNOSIS — F17210 Nicotine dependence, cigarettes, uncomplicated: Secondary | ICD-10-CM | POA: Diagnosis not present

## 2016-08-01 DIAGNOSIS — I495 Sick sinus syndrome: Secondary | ICD-10-CM | POA: Diagnosis not present

## 2016-08-01 DIAGNOSIS — Z950 Presence of cardiac pacemaker: Secondary | ICD-10-CM | POA: Diagnosis not present

## 2016-08-01 NOTE — Patient Instructions (Signed)
Dr Royann Shiversroitoru recommends that you schedule a follow-up appointment in 12 months with a pacemaker check. You will receive a reminder letter in the mail two months in advance. If you don't receive a letter, please call our office to schedule the follow-up appointment.  If you need a refill on your cardiac medications before your next appointment, please call your pharmacy.

## 2016-08-03 DIAGNOSIS — R9439 Abnormal result of other cardiovascular function study: Secondary | ICD-10-CM | POA: Insufficient documentation

## 2016-08-03 NOTE — Progress Notes (Signed)
Patient ID: Michael [name], male   DOB: 1960-06-15, 56 y.o.   MRN: [id number]     Cardiology Office Note    Date:  08/03/2016   ID:  Michael [name], DOB Apr 08, 1960, MRN [id number]  PCP:  Everardo Beals, NP  Cardiologist:   Sanda Klein, MD   chief complaint: chest pain   History of Present Illness:  Michael [name] is a 56 y.o. male with symptomatic bradycardia due to sinus node dysfunction, returning for a pacemaker check. As before, his compliance with follow-up and medications is poor. Nevertheless, this is the second time he has come for a pacemaker check this year which is a definite improvement.   He is not very communicative today. One of his cousins, very close to him, died this weekend of cancer.  His nuclear stress test in April reported a mildly reduced left ventricular ejection fraction at 49%. There was a very small area of inferoapical ischemia but the study was low risk. Echo in 2014 showed a similar low normal LVEF of 50-55% and showed equivocal findings for an inferior wall motion abnormality.  He continues to smoke a pack a day per his report, but his mother Dominic Pea (also our pacemaker patient) anxiety probably smokes at least twice as much. He has not been troubled by chest pain again. He has had generalized muscle cramps, but does not take a diuretic or a statin. He does not have exertional angina. He has problems with coughing and wheezing. He has not had syncope, palpitations, dizziness, leg edema, orthopnea or PND. He saw Dr. Melvyn Novas or COPD earlier this month.  Device interrogation shows normal function. The current generator is a Medtronic Adapta implanted as a generator change out in 2014. Battery voltage suggests another 11 years of longevity. There is 38% atrial pacing and only 0.3% ventricular pacing. Very rare episodes of atrial fibrillation/paroxysmal atrial tachycardia , none of these episodes have been symptomatic. Longest was about 90 seconds in duration 163  bpm.    Past Medical History:  Diagnosis Date  . Anginal pain ([place])   . Bradycardia   . Chronic chest pain   . COPD (chronic obstructive pulmonary disease) ([place])   . Depression   . History of nuclear stress test 06/14/2011   lexiscan; mild perfusion defect in basal inferosetpal & apical inferior region; negative for ischemia   . Pacemaker   . Pneumonia   . 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 Seizure disorder ([place])   . Shortness of breath   . Tobacco abuse     Past Surgical History:  Procedure Laterality Date  . CARDIAC CATHETERIZATION  2002, 2003   normal coronaries  . EYE SURGERY  [age] y.o.  . GALLBLADDER SURGERY  2003  . HAND SURGERY  1998   x3  . PACEMAKER INSERTION  04/23/2003   Medtronic Kappa; [facility] - symptomatic bradycardia  . PERMANENT PACEMAKER GENERATOR CHANGE N/A 09/06/2012   Procedure: PERMANENT PACEMAKER GENERATOR CHANGE;  Surgeon: Sanda Klein, MD;  Location: [facility]ATH [facility];  Service: Cardiovascular;  Laterality: N/A;  . TRANSTHORACIC ECHOCARDIOGRAM  05/14/2012   EF 79-02%, normal systolic function; mild MR (ordered for mitral valve disease 424.0)    Outpatient Medications Prior to Visit  Medication Sig Dispense Refill  . Acetaminophen (TYLENOL) 325 MG CAPS Take by mouth as needed.    [name] albuterol (PROVENTIL HFA;VENTOLIN HFA) 108 (90 Base) MCG/ACT inhaler Inhale 2 puffs into the lungs  every 6 (six) hours as needed for wheezing or shortness of breath. 1 Inhaler 5  . aspirin 81 MG tablet Take 81 mg by mouth daily.    . budesonide-formoterol (SYMBICORT) 160-4.5 MCG/ACT inhaler Take 2 puffs first thing in am and then another 2 puffs about 12 hours later. 1 Inhaler 5  . carbamazepine (CARBATROL) 200 MG 12 hr capsule Take 1 capsule (200 mg total) by mouth daily. 30 capsule 0  . FLUoxetine (PROZAC) 40 MG capsule Take 1 capsule (40 mg total) by mouth daily. 90 capsule 1  . hydrOXYzine (VISTARIL) 50 MG capsule  Take 1 capsule (50 mg total) by mouth daily as needed for anxiety (anxiety and panic). 90 capsule 0  . ipratropium (ATROVENT HFA) 17 MCG/ACT inhaler Inhale 2 puffs into the lungs 4 (four) times daily. 1 Inhaler 11  . ipratropium-albuterol (DUONEB) 0.5-2.5 (3) MG/3ML SOLN Take 3 mLs by nebulization every 4 (four) hours as needed (SOB). 360 mL 1  . isosorbide mononitrate (IMDUR) 30 MG 24 hr tablet Take 1 tablet (30 mg total) by mouth daily. 90 tablet 3  . omeprazole (PRILOSEC) 20 MG capsule Take 20 mg by mouth 2 (two) times daily.     . prazosin (MINIPRESS) 5 MG capsule Take 5 mg (1 capsule ) at night 90 capsule 1  . QUEtiapine (SEROQUEL) 200 MG tablet Take 1 tablet (200 mg total) by mouth at bedtime. 90 tablet 0  . QUEtiapine (SEROQUEL) 50 MG tablet TAKE 1 TABLET BY MOUTH EVERY MORNING 90 tablet 0   Facility-Administered Medications Prior to Visit  Medication Dose Route Frequency Provider Last Rate Last Dose  . guaiFENesin (MUCINEX) 12 hr tablet 600 mg  600 mg Oral BID Carlyle Basques, MD         Allergies:   Shellfish allergy   Social History   Social History  . Marital status: Divorced    Spouse name: N/A  . Number of children: 2  . Years of education: 12   Occupational History  . Disabled    Social History Main Topics  . Smoking status: Current Every Day Smoker    Packs/day: 1.00    Years: 40.00    Types: Cigarettes  . Smokeless tobacco: Never Used  . Alcohol use No  . Drug use: No  . Sexual activity: Not Currently   Other Topics Concern  . None   Social History Narrative  . None     Family History:  The patient's family history includes CAD in his father; COPD in his father; Heart Problems in his mother and other; Heart attack in his maternal grandmother, paternal grandfather, and paternal grandmother; Heart disease in his father; Hypertension in his maternal grandfather; Other in his sister.   ROS:   Please see the history of present illness.    ROS All other  systems reviewed and are negative.   PHYSICAL EXAM:   VS:  BP 116/78   Pulse 68   Ht _0  (1.753 m)   Wt 142 lb (64.4 kg)   BMI 20.97 kg/m    GEN: Well nourished, well developed, in no acute distress   General: Alert, oriented x3, no distress Head: no evidence of trauma, PERRL, EOMI, no exophtalmos or lid lag, no myxedema, no xanthelasma; normal ears, nose and oropharynx Neck: normal jugular venous pulsations and no hepatojugular reflux; brisk carotid pulses without delay and no carotid bruits Chest: clear to auscultation, no signs of consolidation by percussion or palpation, normal fremitus, symmetrical and full  respiratory excursions Cardiovascular: normal position and quality of the apical impulse, regular rhythm, normal first and second heart sounds, no murmurs, rubs or gallops Abdomen: no tenderness or distention, no masses by palpation, no abnormal pulsatility or arterial bruits, normal bowel sounds, no hepatosplenomegaly Extremities: no clubbing, cyanosis or edema; 2+ radial, ulnar and brachial pulses bilaterally; 2+ right femoral, posterior tibial and dorsalis pedis pulses; 2+ left femoral, posterior tibial and dorsalis pedis pulses; no subclavian or femoral bruits Neurological: grossly nonfocal Psych: euthymic mood, full affect  Wt Readings from Last 3 Encounters:  08/01/16 142 lb (64.4 kg)  07/11/16 144 lb (65.3 kg)  04/21/16 143 lb (64.9 kg)      Studies/Labs Reviewed:   EKG:  EKG is not ordered today.    Recent Labs: 12/08/2015: BUN 15; Creatinine, Ser 1.11; Hemoglobin 13.3; Platelets 323.0; Potassium 3.9; Sodium 141   Lipid Panel    Component Value Date/Time   CHOL 153 09/04/2012 0839   TRIG 96 09/04/2012 0839   HDL 45 09/04/2012 0839   CHOLHDL 3.4 09/04/2012 0839   VLDL 19 09/04/2012 0839   LDLCALC 89 09/04/2012 0839     ASSESSMENT:    1. Abnormal nuclear stress test   2. Sinus node dysfunction (HCC)   3. Pacemaker   4. Paroxysmal atrial fibrillation  (HCC)   5. Cigarette smoker   6. COPD GOLD III       PLAN:  In order of problems listed above:  1. Abnormal nuclear stress test: Images reviewed and they indeed appear to be low risk. He seems to be asymptomatic after starting long-acting nitrates. He is taking aspirin daily. The focus is on risk factor medication. Need to get an updated lipid profile 2. SSS: No symptoms of bradycardia or chronotropic incompetence with current pacemaker settings.  3. PM: Normal pacemaker function. He has not been compliant with remote follow-up in the past, but we may be able to get him interested in using the Smart phone app.  4. PAT/AFib: His episodes of atrial fibrillation are very infrequent (a few times a year year) very brief (always under 10 minutes, rarely over 2 minutes) and asymptomatic. Specific therapy is not indicated. His embolic risk is low. CHADSVasc score zero ( score 1 if we consider him as having CAD). Continue aspirin. He would be a very poor candidate for full anticoagulation anyway,  because of his compliance issues. 5. Smoking: Strongly encouraged to quit smoking, but he expressed no interest in this whatsoever.  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    Medication Adjustments/Labs and Tests Ordered: Current medicines are reviewed at length with the patient today.  Concerns regarding medicines are outlined above.  Medication changes, Labs and Tests ordered today are listed in the Patient Instructions below. Patient Instructions  Dr Sallyanne Kuster recommends that you schedule a follow-up appointment in 12 months with a pacemaker check. You will receive a reminder letter in the mail two months in advance. If you don't receive a letter, please call our office to schedule the follow-up appointment.  If you need a refill on your cardiac medications before your next appointment, please call your  pharmacy.      Signed, Sanda Klein, MD  08/03/2016 3:24 PM    [facility] [address], [place], [place]  [address] Phone: [phone]; Fax: [phone]) [phone]   .

## 2016-08-24 ENCOUNTER — Other Ambulatory Visit: Payer: Self-pay | Admitting: Orthopedic Surgery

## 2016-08-24 DIAGNOSIS — S52571B Other intraarticular fracture of lower end of right radius, initial encounter for open fracture type I or II: Secondary | ICD-10-CM

## 2016-08-26 ENCOUNTER — Ambulatory Visit
Admission: RE | Admit: 2016-08-26 | Discharge: 2016-08-26 | Disposition: A | Discharge status: Home / Self Care | Payer: Medicare HMO | Source: Ambulatory Visit | Attending: Orthopedic Surgery | Admitting: Orthopedic Surgery

## 2016-08-26 DIAGNOSIS — S52571B Other intraarticular fracture of lower end of right radius, initial encounter for open fracture type I or II: Secondary | ICD-10-CM

## 2016-09-01 ENCOUNTER — Telehealth: Payer: Self-pay

## 2016-09-01 ENCOUNTER — Encounter: Payer: Self-pay | Admitting: Cardiovascular Disease

## 2016-09-01 NOTE — Telephone Encounter (Signed)
1. Type of surgery: right wrist open reduction internal fixation repair as indicated 2. Date of surgery: 09/07/16 3. Surgeon: Dr Gilman SchmidtFred Ortman 4. Medications that need to be held & how long: NA; pt takes ASA 81 mg QD 5. Fax and/or Phone: (p) [phone]) [phone]

## 2016-09-01 NOTE — Telephone Encounter (Signed)
epicd 

## 2016-09-06 ENCOUNTER — Encounter (HOSPITAL_COMMUNITY): Payer: Self-pay | Admitting: *Deleted
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
## 2016-09-07 ENCOUNTER — Ambulatory Visit (HOSPITAL_COMMUNITY): Payer: Medicare HMO | Admitting: Certified Registered"

## 2016-09-07 ENCOUNTER — Ambulatory Visit (HOSPITAL_COMMUNITY)
Admission: RE | Admit: 2016-09-07 | Discharge: 2016-09-08 | Disposition: A | Discharge status: Home / Self Care | Payer: Medicare HMO | Source: Ambulatory Visit | Attending: Orthopedic Surgery | Admitting: Orthopedic Surgery

## 2016-09-07 ENCOUNTER — Encounter (HOSPITAL_COMMUNITY): Payer: Self-pay

## 2016-09-07 ENCOUNTER — Encounter (HOSPITAL_COMMUNITY)
Admission: RE | Disposition: A | Discharge status: Home / Self Care | Payer: Self-pay | Source: Ambulatory Visit | Attending: Orthopedic Surgery

## 2016-09-07 DIAGNOSIS — X58XXXD Exposure to other specified factors, subsequent encounter: Secondary | ICD-10-CM | POA: Insufficient documentation

## 2016-09-07 DIAGNOSIS — A310 Pulmonary mycobacterial infection: Secondary | ICD-10-CM

## 2016-09-07 DIAGNOSIS — F17210 Nicotine dependence, cigarettes, uncomplicated: Secondary | ICD-10-CM | POA: Insufficient documentation

## 2016-09-07 DIAGNOSIS — R001 Bradycardia, unspecified: Secondary | ICD-10-CM | POA: Diagnosis not present

## 2016-09-07 DIAGNOSIS — G40909 Epilepsy, unspecified, not intractable, without status epilepticus: Secondary | ICD-10-CM | POA: Diagnosis not present

## 2016-09-07 DIAGNOSIS — Z79899 Other long term (current) drug therapy: Secondary | ICD-10-CM | POA: Diagnosis not present

## 2016-09-07 DIAGNOSIS — S52571P Other intraarticular fracture of lower end of right radius, subsequent encounter for closed fracture with malunion: Secondary | ICD-10-CM | POA: Insufficient documentation

## 2016-09-07 DIAGNOSIS — F329 Major depressive disorder, single episode, unspecified: Secondary | ICD-10-CM | POA: Insufficient documentation

## 2016-09-07 DIAGNOSIS — S52502P Unspecified fracture of the lower end of left radius, subsequent encounter for closed fracture with malunion: Secondary | ICD-10-CM | POA: Diagnosis present

## 2016-09-07 DIAGNOSIS — F419 Anxiety disorder, unspecified: Secondary | ICD-10-CM | POA: Insufficient documentation

## 2016-09-07 DIAGNOSIS — Z8249 Family history of ischemic heart disease and other diseases of the circulatory system: Secondary | ICD-10-CM | POA: Insufficient documentation

## 2016-09-07 DIAGNOSIS — Z950 Presence of cardiac pacemaker: Secondary | ICD-10-CM | POA: Insufficient documentation

## 2016-09-07 DIAGNOSIS — K219 Gastro-esophageal reflux disease without esophagitis: Secondary | ICD-10-CM | POA: Insufficient documentation

## 2016-09-07 DIAGNOSIS — E039 Hypothyroidism, unspecified: Secondary | ICD-10-CM | POA: Diagnosis not present

## 2016-09-07 DIAGNOSIS — J449 Chronic obstructive pulmonary disease, unspecified: Secondary | ICD-10-CM | POA: Diagnosis not present

## 2016-09-07 DIAGNOSIS — S52602P Unspecified fracture of lower end of left ulna, subsequent encounter for closed fracture with malunion: Secondary | ICD-10-CM | POA: Diagnosis present

## 2016-09-07 HISTORY — DX: Unspecified convulsions: R56.9

## 2016-09-07 HISTORY — PX: OPEN REDUCTION INTERNAL FIXATION (ORIF) DISTAL RADIAL FRACTURE: SHX5989

## 2016-09-07 HISTORY — DX: Hypothyroidism, unspecified: E03.9

## 2016-09-07 HISTORY — DX: Gastro-esophageal reflux disease without esophagitis: K21.9

## 2016-09-07 HISTORY — DX: Anxiety disorder, unspecified: F41.9

## 2016-09-07 LAB — CBC
HEMATOCRIT: 37.1 % — AB (ref 39.0–52.0)
HEMOGLOBIN: 12.1 g/dL — AB (ref 13.0–17.0)
MCH: 33.3 pg (ref 26.0–34.0)
MCHC: 32.6 g/dL (ref 30.0–36.0)
MCV: 102.2 fL — ABNORMAL HIGH (ref 78.0–100.0)
Platelets: 340 10*3/uL (ref 150–400)
RBC: 3.63 MIL/uL — ABNORMAL LOW (ref 4.22–5.81)
RDW: 14.1 % (ref 11.5–15.5)
WBC: 6.1 10*3/uL (ref 4.0–10.5)

## 2016-09-07 LAB — BASIC METABOLIC PANEL
ANION GAP: 9 (ref 5–15)
BUN: 12 mg/dL (ref 6–20)
CO2: 27 mmol/L (ref 22–32)
Calcium: 9.4 mg/dL (ref 8.9–10.3)
Chloride: 102 mmol/L (ref 101–111)
Creatinine, Ser: 0.91 mg/dL (ref 0.61–1.24)
GFR calc non Af Amer: >60 mL/min (ref >60)
Glucose, Bld: 78 mg/dL (ref 65–99)
POTASSIUM: 3.9 mmol/L (ref 3.5–5.1)
SODIUM: 138 mmol/L (ref 135–145)

## 2016-09-07 SURGERY — OPEN REDUCTION INTERNAL FIXATION (ORIF) DISTAL RADIUS FRACTURE
Anesthesia: Regional | Site: Wrist | Laterality: Right

## 2016-09-07 MED ORDER — METHOCARBAMOL 1000 MG/10ML IJ SOLN
500.0000 mg | Freq: Four times a day (QID) | INTRAVENOUS | Status: DC | PRN
  Filled 2016-09-07: qty 5

## 2016-09-07 MED ORDER — CEPHALEXIN 500 MG PO CAPS
500.0000 mg | ORAL_CAPSULE | Freq: Three times a day (TID) | ORAL | Status: DC
  Administered 2016-09-07 – 2016-09-08 (×2): 500 mg via ORAL
  Filled 2016-09-07 (×3): qty 1

## 2016-09-07 MED ORDER — VITAMIN C 500 MG PO TABS
1000.0000 mg | ORAL_TABLET | Freq: Every day | ORAL | Status: DC
  Administered 2016-09-07 – 2016-09-08 (×2): 1000 mg via ORAL
  Filled 2016-09-07 (×2): qty 2

## 2016-09-07 MED ORDER — HYDROXYZINE PAMOATE 50 MG PO CAPS
50.0000 mg | ORAL_CAPSULE | Freq: Every day | ORAL | Status: DC | PRN
  Filled 2016-09-07: qty 1

## 2016-09-07 MED ORDER — HYDROMORPHONE HCL 1 MG/ML IJ SOLN: 0.2500 mg | INTRAMUSCULAR | Status: DC | PRN

## 2016-09-07 MED ORDER — LACTATED RINGERS IV SOLN
INTRAVENOUS | Status: DC
  Administered 2016-09-07 (×2): via INTRAVENOUS

## 2016-09-07 MED ORDER — ALBUTEROL SULFATE (2.5 MG/3ML) 0.083% IN NEBU: 3.0000 mL | INHALATION_SOLUTION | Freq: Four times a day (QID) | RESPIRATORY_TRACT | Status: DC | PRN

## 2016-09-07 MED ORDER — ONDANSETRON HCL 4 MG/2ML IJ SOLN
INTRAMUSCULAR | Status: DC | PRN
  Administered 2016-09-07: 4 mg via INTRAVENOUS

## 2016-09-07 MED ORDER — PANTOPRAZOLE SODIUM 40 MG PO TBEC
40.0000 mg | DELAYED_RELEASE_TABLET | Freq: Every day | ORAL | Status: DC
  Administered 2016-09-07 – 2016-09-08 (×2): 40 mg via ORAL
  Filled 2016-09-07 (×2): qty 1

## 2016-09-07 MED ORDER — KCL IN DEXTROSE-NACL 20-5-0.45 MEQ/L-%-% IV SOLN
INTRAVENOUS | Status: DC
  Administered 2016-09-07: 22:00:00 via INTRAVENOUS
  Filled 2016-09-07: qty 1000

## 2016-09-07 MED ORDER — DIPHENHYDRAMINE HCL 25 MG PO CAPS: 25.0000 mg | ORAL_CAPSULE | Freq: Four times a day (QID) | ORAL | Status: DC | PRN

## 2016-09-07 MED ORDER — ADULT MULTIVITAMIN W/MINERALS CH
1.0000 | ORAL_TABLET | Freq: Every day | ORAL | Status: DC
  Administered 2016-09-07 – 2016-09-08 (×2): 1 via ORAL
  Filled 2016-09-07 (×2): qty 1

## 2016-09-07 MED ORDER — PROPOFOL 500 MG/50ML IV EMUL
INTRAVENOUS | Status: DC | PRN
  Administered 2016-09-07: 50 ug/kg/min via INTRAVENOUS

## 2016-09-07 MED ORDER — CEFAZOLIN SODIUM-DEXTROSE 2-4 GM/100ML-% IV SOLN
INTRAVENOUS | Status: AC
  Filled 2016-09-07: qty 100

## 2016-09-07 MED ORDER — ONDANSETRON HCL 4 MG/2ML IJ SOLN: 4.0000 mg | Freq: Four times a day (QID) | INTRAMUSCULAR | Status: DC | PRN

## 2016-09-07 MED ORDER — CEFAZOLIN SODIUM-DEXTROSE 1-4 GM/50ML-% IV SOLN
1.0000 g | Freq: Three times a day (TID) | INTRAVENOUS | Status: DC
  Administered 2016-09-08 (×2): 1 g via INTRAVENOUS
  Filled 2016-09-07 (×3): qty 50

## 2016-09-07 MED ORDER — PRAZOSIN HCL 5 MG PO CAPS
5.0000 mg | ORAL_CAPSULE | Freq: Every day | ORAL | Status: DC
  Filled 2016-09-07: qty 1

## 2016-09-07 MED ORDER — METHOCARBAMOL 500 MG PO TABS: 500.0000 mg | ORAL_TABLET | Freq: Four times a day (QID) | ORAL | Status: DC | PRN

## 2016-09-07 MED ORDER — 0.9 % SODIUM CHLORIDE (POUR BTL) OPTIME
TOPICAL | Status: DC | PRN
  Administered 2016-09-07: 1000 mL

## 2016-09-07 MED ORDER — ONDANSETRON HCL 4 MG PO TABS: 4.0000 mg | ORAL_TABLET | Freq: Four times a day (QID) | ORAL | Status: DC | PRN

## 2016-09-07 MED ORDER — IPRATROPIUM BROMIDE 0.02 % IN SOLN
2.5000 mL | Freq: Four times a day (QID) | RESPIRATORY_TRACT | Status: DC
  Administered 2016-09-07: 0.5 mg via RESPIRATORY_TRACT
  Filled 2016-09-07: qty 2.5

## 2016-09-07 MED ORDER — FENTANYL CITRATE (PF) 100 MCG/2ML IJ SOLN
100.0000 ug | Freq: Once | INTRAMUSCULAR | Status: AC
  Administered 2016-09-07: 100 ug via INTRAVENOUS

## 2016-09-07 MED ORDER — MOMETASONE FURO-FORMOTEROL FUM 200-5 MCG/ACT IN AERO
2.0000 | INHALATION_SPRAY | Freq: Two times a day (BID) | RESPIRATORY_TRACT | Status: DC
  Administered 2016-09-08: 2 via RESPIRATORY_TRACT
  Filled 2016-09-07 (×2): qty 8.8

## 2016-09-07 MED ORDER — PROPOFOL 10 MG/ML IV BOLUS
INTRAVENOUS | Status: DC | PRN
  Administered 2016-09-07: 20 mg via INTRAVENOUS

## 2016-09-07 MED ORDER — ONDANSETRON HCL 4 MG/2ML IJ SOLN: 4.0000 mg | Freq: Once | INTRAMUSCULAR | Status: DC | PRN

## 2016-09-07 MED ORDER — QUETIAPINE FUMARATE 50 MG PO TABS
50.0000 mg | ORAL_TABLET | Freq: Every morning | ORAL | Status: DC
  Administered 2016-09-08: 50 mg via ORAL
  Filled 2016-09-07: qty 1

## 2016-09-07 MED ORDER — ADULT MULTIVITAMIN W/MINERALS CH: 1.0000 | ORAL_TABLET | Freq: Every day | ORAL | Status: DC

## 2016-09-07 MED ORDER — FENTANYL CITRATE (PF) 100 MCG/2ML IJ SOLN
INTRAMUSCULAR | Status: AC
  Administered 2016-09-07: 100 ug via INTRAVENOUS
  Filled 2016-09-07: qty 2

## 2016-09-07 MED ORDER — ISOSORBIDE MONONITRATE ER 30 MG PO TB24
30.0000 mg | ORAL_TABLET | Freq: Every day | ORAL | Status: DC
  Administered 2016-09-08: 30 mg via ORAL
  Filled 2016-09-07 (×2): qty 1

## 2016-09-07 MED ORDER — OXYCODONE HCL 5 MG PO TABS: 5.0000 mg | ORAL_TABLET | ORAL | Status: DC | PRN

## 2016-09-07 MED ORDER — GUAIFENESIN ER 600 MG PO TB12
600.0000 mg | ORAL_TABLET | Freq: Two times a day (BID) | ORAL | Status: DC
  Administered 2016-09-07 – 2016-09-08 (×2): 600 mg via ORAL
  Filled 2016-09-07 (×2): qty 1

## 2016-09-07 MED ORDER — FLUOXETINE HCL 10 MG PO CAPS
40.0000 mg | ORAL_CAPSULE | Freq: Every day | ORAL | Status: DC
  Administered 2016-09-07 – 2016-09-08 (×2): 40 mg via ORAL
  Filled 2016-09-07 (×2): qty 4

## 2016-09-07 MED ORDER — MEPERIDINE HCL 25 MG/ML IJ SOLN: 6.2500 mg | INTRAMUSCULAR | Status: DC | PRN

## 2016-09-07 MED ORDER — CEFAZOLIN SODIUM-DEXTROSE 1-4 GM/50ML-% IV SOLN: 1.0000 g | INTRAVENOUS | Status: DC

## 2016-09-07 MED ORDER — HYDROMORPHONE HCL 1 MG/ML IJ SOLN
0.5000 mg | INTRAMUSCULAR | Status: DC | PRN
  Administered 2016-09-07 – 2016-09-08 (×4): 1 mg via INTRAVENOUS
  Filled 2016-09-07 (×4): qty 1

## 2016-09-07 MED ORDER — FENTANYL CITRATE (PF) 250 MCG/5ML IJ SOLN
INTRAMUSCULAR | Status: AC
  Filled 2016-09-07: qty 5

## 2016-09-07 MED ORDER — HYDROCODONE-ACETAMINOPHEN 10-325 MG PO TABS
1.0000 | ORAL_TABLET | ORAL | Status: DC | PRN
  Administered 2016-09-08: 2 via ORAL
  Filled 2016-09-07: qty 2

## 2016-09-07 MED ORDER — FENTANYL CITRATE (PF) 100 MCG/2ML IJ SOLN
INTRAMUSCULAR | Status: DC | PRN
  Administered 2016-09-07: 25 ug via INTRAVENOUS
  Administered 2016-09-07: 50 ug via INTRAVENOUS
  Administered 2016-09-07: 25 ug via INTRAVENOUS

## 2016-09-07 MED ORDER — CEFAZOLIN SODIUM-DEXTROSE 2-4 GM/100ML-% IV SOLN
2.0000 g | Freq: Once | INTRAVENOUS | Status: AC
  Administered 2016-09-07: 2 g via INTRAVENOUS

## 2016-09-07 MED ORDER — MIDAZOLAM HCL 2 MG/2ML IJ SOLN
INTRAMUSCULAR | Status: AC
Start: 2016-09-07 — End: 2016-09-07
  Administered 2016-09-07: 2 mg via INTRAVENOUS
  Filled 2016-09-07: qty 2

## 2016-09-07 MED ORDER — IPRATROPIUM-ALBUTEROL 0.5-2.5 (3) MG/3ML IN SOLN: 3.0000 mL | RESPIRATORY_TRACT | Status: DC | PRN

## 2016-09-07 MED ORDER — QUETIAPINE FUMARATE 200 MG PO TABS
200.0000 mg | ORAL_TABLET | Freq: Every day | ORAL | Status: DC
  Filled 2016-09-07: qty 4
  Filled 2016-09-07: qty 1

## 2016-09-07 MED ORDER — MIDAZOLAM HCL 2 MG/2ML IJ SOLN
2.0000 mg | Freq: Once | INTRAMUSCULAR | Status: AC
  Administered 2016-09-07: 2 mg via INTRAVENOUS

## 2016-09-07 MED ORDER — CARBAMAZEPINE ER 200 MG PO TB12
200.0000 mg | ORAL_TABLET | Freq: Every day | ORAL | Status: DC
  Administered 2016-09-08: 200 mg via ORAL
  Filled 2016-09-07 (×2): qty 1

## 2016-09-07 SURGICAL SUPPLY — 73 items
BANDAGE ACE 3X5.8 VEL STRL LF (GAUZE/BANDAGES/DRESSINGS) ×3 IMPLANT
BANDAGE ACE 4X5 VEL STRL LF (GAUZE/BANDAGES/DRESSINGS) ×3 IMPLANT
BIT DRILL 2.2 SS TIBIAL (BIT) ×2 IMPLANT
BLADE CLIPPER SURG (BLADE) IMPLANT
BLADE SURG 15 STRL LF DISP TIS (BLADE) IMPLANT
BLADE SURG 15 STRL SS (BLADE) ×6
BNDG CMPR 9X4 STRL LF SNTH (GAUZE/BANDAGES/DRESSINGS) ×1
BNDG CONFORM 3 STRL LF (GAUZE/BANDAGES/DRESSINGS) ×2 IMPLANT
BNDG ESMARK 4X9 LF (GAUZE/BANDAGES/DRESSINGS) ×3 IMPLANT
BNDG GAUZE ELAST 4 BULKY (GAUZE/BANDAGES/DRESSINGS) ×3 IMPLANT
CANISTER SUCT 3000ML PPV (MISCELLANEOUS) ×3 IMPLANT
CORDS BIPOLAR (ELECTRODE) ×3 IMPLANT
COVER SURGICAL LIGHT HANDLE (MISCELLANEOUS) ×3 IMPLANT
CUFF TOURNIQUET SINGLE 18IN (TOURNIQUET CUFF) ×3 IMPLANT
CUFF TOURNIQUET SINGLE 24IN (TOURNIQUET CUFF) IMPLANT
DECANTER SPIKE VIAL GLASS SM (MISCELLANEOUS) ×1 IMPLANT
DRAPE OEC MINIVIEW 54X84 (DRAPES) ×3 IMPLANT
DRAPE SURG 17X11 SM STRL (DRAPES) ×3 IMPLANT
DRSG ADAPTIC 3X8 NADH LF (GAUZE/BANDAGES/DRESSINGS) ×1 IMPLANT
GAUZE SPONGE 4X4 12PLY STRL (GAUZE/BANDAGES/DRESSINGS) ×3 IMPLANT
GAUZE SPONGE 4X4 16PLY XRAY LF (GAUZE/BANDAGES/DRESSINGS) ×1 IMPLANT
GAUZE XEROFORM 5X9 LF (GAUZE/BANDAGES/DRESSINGS) ×2 IMPLANT
GLOVE BIO SURGEON STRL SZ 6.5 (GLOVE) ×2 IMPLANT
GLOVE BIO SURGEONS STRL SZ 6.5 (GLOVE) ×2
GLOVE BIOGEL PI IND STRL 6.5 (GLOVE) IMPLANT
GLOVE BIOGEL PI IND STRL 7.0 (GLOVE) IMPLANT
GLOVE BIOGEL PI IND STRL 8.5 (GLOVE) ×1 IMPLANT
GLOVE BIOGEL PI INDICATOR 6.5 (GLOVE) ×2
GLOVE BIOGEL PI INDICATOR 7.0 (GLOVE) ×2
GLOVE BIOGEL PI INDICATOR 8.5 (GLOVE) ×2
GLOVE SURG ORTHO 8.0 STRL STRW (GLOVE) ×5 IMPLANT
GOWN STRL REUS W/ TWL LRG LVL3 (GOWN DISPOSABLE) ×1 IMPLANT
GOWN STRL REUS W/ TWL XL LVL3 (GOWN DISPOSABLE) ×1 IMPLANT
GOWN STRL REUS W/TWL LRG LVL3 (GOWN DISPOSABLE) ×3
GOWN STRL REUS W/TWL XL LVL3 (GOWN DISPOSABLE) ×3
K-WIRE 1.6 (WIRE) ×6
K-WIRE FX5X1.6XNS BN SS (WIRE) ×2
KIT BASIN OR (CUSTOM PROCEDURE TRAY) ×3 IMPLANT
KIT ROOM TURNOVER OR (KITS) ×3 IMPLANT
KWIRE FX5X1.6XNS BN SS (WIRE) IMPLANT
NDL HYPO 25X1 1.5 SAFETY (NEEDLE) ×1 IMPLANT
NEEDLE HYPO 25X1 1.5 SAFETY (NEEDLE) ×3 IMPLANT
NS IRRIG 1000ML POUR BTL (IV SOLUTION) ×3 IMPLANT
PACK ORTHO EXTREMITY (CUSTOM PROCEDURE TRAY) ×3 IMPLANT
PAD ARMBOARD 7.5X6 YLW CONV (MISCELLANEOUS) ×4 IMPLANT
PAD CAST 4YDX4 CTTN HI CHSV (CAST SUPPLIES) ×1 IMPLANT
PADDING CAST COTTON 4X4 STRL (CAST SUPPLIES) ×3
PEG LOCKING SMOOTH 2.2X20 (Screw) ×4 IMPLANT
PEG LOCKING SMOOTH 2.2X22 (Screw) ×6 IMPLANT
PLATE RIGHT VOLAR RIM DVR (Plate) ×2 IMPLANT
PUTTY DBM STAGRAFT PLUS 2CC (Putty) ×2 IMPLANT
SCREW LOCK 14X2.7X 3 LD TPR (Screw) IMPLANT
SCREW LOCK 16X2.7X 3 LD TPR (Screw) IMPLANT
SCREW LOCKING 2.7X13MM (Screw) ×2 IMPLANT
SCREW LOCKING 2.7X14 (Screw) ×3 IMPLANT
SCREW LOCKING 2.7X16 (Screw) ×6 IMPLANT
SCREW MULTI DIRECTIONAL 2.7X18 (Screw) ×2 IMPLANT
SCREW MULTI DIRECTIONAL 2.7X20 (Screw) ×2 IMPLANT
SOAP 2 % CHG 4 OZ (WOUND CARE) ×3 IMPLANT
SPLINT FIBERGLASS 3X35 (CAST SUPPLIES) ×2 IMPLANT
SPONGE LAP 4X18 X RAY DECT (DISPOSABLE) ×1 IMPLANT
SUT FIBERWIRE 3-0 18 TAPR NDL (SUTURE) ×3
SUT PROLENE 4 0 PS 2 18 (SUTURE) ×2 IMPLANT
SUT VIC AB 2-0 FS1 27 (SUTURE) ×2 IMPLANT
SUT VICRYL 4-0 PS2 18IN ABS (SUTURE) ×2 IMPLANT
SUTURE FIBERWR 3-0 18 TAPR NDL (SUTURE) IMPLANT
SYR CONTROL 10ML LL (SYRINGE) IMPLANT
TOWEL OR 17X24 6PK STRL BLUE (TOWEL DISPOSABLE) ×3 IMPLANT
TOWEL OR 17X26 10 PK STRL BLUE (TOWEL DISPOSABLE) ×3 IMPLANT
TUBE CONNECTING 12'X1/4 (SUCTIONS) ×1
TUBE CONNECTING 12X1/4 (SUCTIONS) ×2 IMPLANT
WATER STERILE IRR 1000ML POUR (IV SOLUTION) ×1 IMPLANT
YANKAUER SUCT BULB TIP NO VENT (SUCTIONS) ×2 IMPLANT

## 2016-09-07 NOTE — Anesthesia Postprocedure Evaluation (Signed)
Anesthesia Post Note  Patient: Michael [name]  Procedure(s) Performed: Procedure(s) (LRB): RIGHT WRIST OPEN REDUCTION INTERNAL FIXATION (ORIF) REPAIR AS INDICATED (Right)     Patient location during evaluation: PACU Anesthesia Type: Regional and MAC Level of consciousness: awake and alert Pain management: pain level controlled Vital Signs Assessment: post-procedure vital signs reviewed and stable Respiratory status: spontaneous breathing, nonlabored ventilation, respiratory function stable and patient connected to nasal cannula oxygen Cardiovascular status: stable and blood pressure returned to baseline Anesthetic complications: no    Last Vitals:  Vitals:   09/07/16 2043 09/07/16 2059  BP: 127/74   Pulse: 66   Resp: 16   Temp: 36.4 C   SpO2: 98% 97%    Last Pain:  Vitals:   09/07/16 2043  TempSrc: Oral  PainSc:                  [place]

## 2016-09-07 NOTE — Discharge Instructions (Signed)
KEEP BANDAGE CLEAN AND DRY °CALL OFFICE FOR F/U APPT [phone] in 14 days °KEEP HAND ELEVATED ABOVE HEART °OK TO APPLY ICE TO OPERATIVE AREA °CONTACT OFFICE IF ANY WORSENING PAIN OR CONCERNS. °

## 2016-09-07 NOTE — Op Note (Signed)
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 Gilman SchmidtFred Ortman who was scrubbed and present for the entire procedure  ASSISTANT SURGEON: Lambert ModySamantha Barton PA-C who was scrubbed and necessary for the aid and correction the malunion internal fixation closure and Condition the splint in a timely fashion  ANESTHESIA: Regional block with IV sedation  OPERATIVE PROCEDURE:: Repair of right wrist intra-articular distal radius malunion with volar distal radius plating #2: Right wrist brachia radialis tendon release and tendon tenotomy #3: Removal of deep implants right wrist buried K wires #4: Radiographs 3 views right wrist  IMPLANTS: Biomet DVR volar rim plate, standard with 2 mL of Biomet stay graft  RADIOGRAPHIC INTERPRETATION: AP lateral oblique views the wrist do show the comminuted distal radius malunion with the volar distal radius plate still with a mild intra-articular displacement  SURGICAL INDICATIONS: [name] is a right-hand-dominant gentleman who previously underwent percutaneous skeletal fixation of an unstable distal radius fracture. Patient presented to my office with the malunion. Preoperative evaluation was undertaken. Risks benefits and alternatives were discussed in detail with the patient in a signed informed consent was obtained. Risks include but not limited to bleeding infection damage to nearby nerves arteries or tendons loss of motion of the wrists and digits incomplete relief of symptoms and need for further surgical intervention.  SURGICAL TECHNIQUE: Patient is properly identified in the preoperative holding area and a mark with a permanent marker made on the right wrist indicate the correct operative site. Patient brought back to the operating room placed supine on anesthesia and table where the regional anesthesia had been administered. The local sedation had  been administered. Preoperative antibiotics were given. A well-padded tourniquet was then placed on the right brachium and sealed with the appropriate drape. The right upper extremities and prepped and draped in normal sterile fashion. Timeout was called cracks I was identified and the procedure then begun. Attention was then turned to the right wrist. A longitudinal incision was made directly over the FCR sheath. Dissection was then carried down through the skin and subcutaneous tissue. Deep dissection carried down through the floor the FCR sheath where the pronator quadratus was then elevated in an L-shaped fashion. The intra-articular fracture was then exposed. The patient did have a fractured K wire that was going from the radius to the ulna and this deep implant was then removed. In order to gain exposure the brachia radialis was then carefully identified and released off the radial styloid. Tendon tenotomy was done in order to pronate the proximal segment. The patient had a high highly comminuted distal radius intra-articular malunion. Patient had a large volar rim of the radial and ulnar column that was markedly displaced. This fragment was then carefully teased then reduced in the appropriate position. Once this was carried out it was held in place with a K wire and the plate position was then confirmed using mini C-arm. Plate position was confirmed and then distal fixation proximal fixation was carried out with a combination of locking pegs and multidirectional screws distally bicortical locking screws and nonlocking screws proximally. Thorough wound irrigation done. Final radiographs and then obtained. Prior to placement of the plate in order to support the subchondral surface several cc of the Biomet stay graft bone graft substitute and packed into the defect. The pronator quadratus was then closed with 2-0 Vicryl. Subcutaneous tissues closed with 4-0 Vicryl and the skin closed with 4-0 Prolene sutures.  Xeroform  dressing a sterile compressive bandage then applied. The patient is then placed in a well-padded sugar tong splint and taken recovery room in good condition.  Postoperative plan: Patient be discharged in the morning for IV antibiotics and pain control continue with the IV pain medication overnight 23 hour observation. I'll see him back in the office in 2 weeks for wound check suture removal x-rays long-arm cast for a total of 4 weeks short arm cast for 2 weeks radiographs at each visit.  Prognosis: Very guarded the patient has a highly comminuted malunion loss of the volar articular surface there was attempted to be restored this evening. Follow him closely. Based on the highly are intra-articular fracture the patient may require further intervention if collapse occurs.  POSTOPERATIVE PLAN:

## 2016-09-07 NOTE — Transfer of Care (Signed)
Immediate Anesthesia Transfer of Care Note  Patient: Michael [name]  Procedure(s) Performed: Procedure(s): RIGHT WRIST OPEN REDUCTION INTERNAL FIXATION (ORIF) REPAIR AS INDICATED (Right)  Patient Location: PACU  Anesthesia Type:MAC and Regional  Level of Consciousness: awake, alert  and oriented  Airway & Oxygen Therapy: Patient Spontanous Breathing and Patient connected to nasal cannula oxygen  Post-op Assessment: Report given to RN and Post -op Vital signs reviewed and stable  Post vital signs: Reviewed and stable  Last Vitals:  Vitals:   09/07/16 1545 09/07/16 1547  BP: 116/68   Pulse: 67 72  Resp: 12 13  Temp:    SpO2: 99% 99%    Last Pain:  Vitals:   09/07/16 1425  TempSrc:   PainSc: 10-Worst pain ever         Complications: No apparent anesthesia complications

## 2016-09-07 NOTE — Anesthesia Preprocedure Evaluation (Signed)
Anesthesia Evaluation  Patient identified by MRN, date of birth, ID band Patient awake    Reviewed: Allergy & Precautions, NPO status , Patient's Chart, lab work & pertinent test results  Airway Mallampati: I  TM Distance: >3 FB Neck ROM: Full    Dental   Pulmonary COPD, Current Smoker,    Pulmonary exam normal        Cardiovascular Normal cardiovascular exam+ pacemaker      Neuro/Psych Anxiety Depression    GI/Hepatic GERD  Medicated and Controlled,  Endo/Other    Renal/GU      Musculoskeletal   Abdominal   Peds  Hematology   Anesthesia Other Findings   Reproductive/Obstetrics                             Anesthesia Physical Anesthesia Plan  ASA: III  Anesthesia Plan: Regional   Post-op Pain Management:    Induction: Intravenous  PONV Risk Score and Plan: 0  Airway Management Planned: Simple Face Mask  Additional Equipment:   Intra-op Plan:   Post-operative Plan:   Informed Consent: I have reviewed the patients History and Physical, chart, labs and discussed the procedure including the risks, benefits and alternatives for the proposed anesthesia with the patient or authorized representative who has indicated his/her understanding and acceptance.     Plan Discussed with: CRNA and Surgeon  Anesthesia Plan Comments:         Anesthesia Quick Evaluation
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
## 2016-09-07 NOTE — Progress Notes (Signed)
Patient received to  Unit,made comfortable in bed and oriented to room. Patient informed he has to call for help before getting out of bed. Bed alarm activated. Right arm elevated on pillows per MD orders.

## 2016-09-07 NOTE — Anesthesia Procedure Notes (Addendum)
Anesthesia Regional Block: Supraclavicular block   Pre-Anesthetic Checklist: ,, timeout performed, Correct Patient, Correct Site, Correct Laterality, Correct Procedure, Correct Position, site marked, Risks and benefits discussed,  Surgical consent,  Pre-op evaluation,  At surgeon's request and post-op pain management  Laterality: Right  Prep: chloraprep       Needles:   Needle Type: Echogenic Stimulator Needle     Needle Length: 9cm  Needle Gauge: 21     Additional Needles:   Procedures: ultrasound guided, nerve stimulator,,,,,,   Nerve Stimulator or Paresthesia:  Response: 0.4 mA,   Additional Responses:   Narrative:  Start time: 09/07/2016 3:35 PM End time: 09/07/2016 3:45 PM Injection made incrementally with aspirations every 5 mL.  Performed by: Personally  Anesthesiologist: Arta BruceSSEY, Borghild Thaker  Additional Notes: Monitors applied. Patient sedated. Sterile prep and drape,hand hygiene and sterile gloves were used. Relevant anatomy identified.Needle position confirmed.Local anesthetic injected incrementally after negative aspiration. Local anesthetic spread visualized around nerve(s). Vascular puncture avoided. No complications. Image printed for medical record.The patient tolerated the procedure well.

## 2016-09-08 ENCOUNTER — Encounter (HOSPITAL_COMMUNITY): Payer: Self-pay | Admitting: Orthopedic Surgery

## 2016-09-08 DIAGNOSIS — S52571P Other intraarticular fracture of lower end of right radius, subsequent encounter for closed fracture with malunion: Secondary | ICD-10-CM | POA: Diagnosis not present

## 2016-09-08 MED ORDER — IPRATROPIUM BROMIDE 0.02 % IN SOLN: 2.5000 mL | Freq: Two times a day (BID) | RESPIRATORY_TRACT | Status: DC

## 2016-09-08 MED ORDER — DIPHENHYDRAMINE HCL 25 MG PO CAPS
25.0000 mg | ORAL_CAPSULE | Freq: Four times a day (QID) | ORAL | Status: DC | PRN
  Administered 2016-09-08: 25 mg via ORAL
  Filled 2016-09-08 (×2): qty 1

## 2016-09-08 MED ORDER — IPRATROPIUM BROMIDE 0.02 % IN SOLN
2.5000 mL | Freq: Four times a day (QID) | RESPIRATORY_TRACT | Status: DC
  Administered 2016-09-08: 0.5 mg via RESPIRATORY_TRACT
  Filled 2016-09-08: qty 2.5
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
## 2016-09-08 NOTE — Progress Notes (Signed)
Patient is encouraged to call someone to give him a ride home. Patient states "I dont know when they are coming" Patient's mother is called and she states "Im on my way" Will continue to monitor

## 2016-09-08 NOTE — Progress Notes (Signed)
Discharge reviewed with patient and mother. He is encouraged to [facility] per Orthopedic-MD. Also encouraged to call for follow up appointment. Patient was asisted to family vehicle by staff

## 2016-09-08 NOTE — Care Management Note (Signed)
Case Management Note  Patient Details  Name: Michael [name] MRN: [id number] Date of Birth: 08/15/1960  Subjective/Objective:                    Action/Plan: Pt discharging home with self care. Pt has PCP, insurance and transportation home. No further needs per CM.   Expected Discharge Date:  09/08/16               Expected Discharge Plan:  Home/Self Care  In-House Referral:     Discharge planning Services     Post Acute Care Choice:    Choice offered to:     DME Arranged:    DME Agency:     HH Arranged:    HH Agency:     Status of Service:  Completed, signed off  If discussed at [employer] Length of Stay Meetings, dates discussed:    Additional Comments:  Kermit BaloKelli F Elysse Polidore, RN 09/08/2016, 10:22 AM

## 2016-09-08 NOTE — Progress Notes (Signed)
Call back received from Michael [name] , [place]. PA informed that patient complains of itching whenever he take pain medicine. As per patient this is not new to him.  Its been an ongoing thing even before he came to the hospital. MD verbalizes " give patient 25 mg of Benadryl  q 6 PRN"

## 2016-09-08 NOTE — Progress Notes (Signed)
Patient complaining of itching, Dr Bradly Bienenstockrtmann [facility] services paged , Awaiting response.

## 2016-10-31 ENCOUNTER — Telehealth: Payer: Self-pay | Admitting: Cardiology

## 2016-10-31 ENCOUNTER — Encounter: Payer: Medicare HMO | Admitting: *Deleted

## 2016-10-31 NOTE — Telephone Encounter (Signed)
Confirmed remote transmission w/ pt mother.   

## 2016-11-04 ENCOUNTER — Encounter: Payer: Self-pay | Admitting: Cardiology

## 2016-12-05 ENCOUNTER — Encounter: Payer: Self-pay | Admitting: Cardiology

## 2016-12-05 ENCOUNTER — Ambulatory Visit (INDEPENDENT_AMBULATORY_CARE_PROVIDER_SITE_OTHER): Payer: Medicare HMO | Admitting: *Deleted

## 2016-12-05 DIAGNOSIS — I495 Sick sinus syndrome: Secondary | ICD-10-CM

## 2016-12-05 NOTE — Progress Notes (Signed)
Remote pacemaker transmission.   

## 2016-12-06 LAB — CUP PACEART REMOTE DEVICE CHECK
Battery Remaining Longevity: 138 mo
Brady Statistic AP VP Percent: 0 %
Brady Statistic AP VS Percent: 43 %
Brady Statistic AS VP Percent: 0 %
Date Time Interrogation Session: 20181203202537
Implantable Lead Implant Date: 20050421
Implantable Lead Implant Date: 20050421
Implantable Pulse Generator Implant Date: 20140904
Lead Channel Impedance Value: 422 Ohm
Lead Channel Pacing Threshold Amplitude: 0.75 V
Lead Channel Pacing Threshold Amplitude: 0.875 V
Lead Channel Pacing Threshold Pulse Width: 0.4 ms
Lead Channel Setting Pacing Amplitude: 1.5 V
Lead Channel Setting Pacing Amplitude: 2 V
MDC IDC LEAD LOCATION: 753859
MDC IDC LEAD LOCATION: 753860
MDC IDC MSMT BATTERY IMPEDANCE: 207 Ohm
MDC IDC MSMT BATTERY VOLTAGE: 2.79 V
MDC IDC MSMT LEADCHNL RV IMPEDANCE VALUE: 534 Ohm
MDC IDC MSMT LEADCHNL RV PACING THRESHOLD PULSEWIDTH: 0.4 ms
MDC IDC SET LEADCHNL RV PACING PULSEWIDTH: 0.4 ms
MDC IDC SET LEADCHNL RV SENSING SENSITIVITY: 5.6 mV
MDC IDC STAT BRADY AS VS PERCENT: 56 %

## 2016-12-18 ENCOUNTER — Other Ambulatory Visit: Payer: Self-pay | Admitting: Internal Medicine

## 2017-01-11 ENCOUNTER — Ambulatory Visit (INDEPENDENT_AMBULATORY_CARE_PROVIDER_SITE_OTHER)
Admission: RE | Admit: 2017-01-11 | Discharge: 2017-01-11 | Disposition: A | Discharge status: Home / Self Care | Payer: Medicare HMO | Source: Ambulatory Visit | Attending: Internal Medicine | Admitting: Internal Medicine

## 2017-01-11 ENCOUNTER — Ambulatory Visit: Payer: Medicare HMO | Admitting: Internal Medicine

## 2017-01-11 ENCOUNTER — Encounter: Payer: Self-pay | Admitting: Internal Medicine

## 2017-01-11 VITALS — BP 124/76 | HR 66 | Ht 69.0 in | Wt 139.6 lb

## 2017-01-11 DIAGNOSIS — J449 Chronic obstructive pulmonary disease, unspecified: Secondary | ICD-10-CM

## 2017-01-11 DIAGNOSIS — F17210 Nicotine dependence, cigarettes, uncomplicated: Secondary | ICD-10-CM

## 2017-01-11 MED ORDER — BUDESONIDE-FORMOTEROL FUMARATE 160-4.5 MCG/ACT IN AERO: INHALATION_SPRAY | RESPIRATORY_TRACT | 3 refills | Status: DC

## 2017-01-11 MED ORDER — IPRATROPIUM BROMIDE HFA 17 MCG/ACT IN AERS: INHALATION_SPRAY | RESPIRATORY_TRACT | 3 refills | Status: DC

## 2017-01-11 MED ORDER — ALBUTEROL SULFATE HFA 108 (90 BASE) MCG/ACT IN AERS: 2.0000 | INHALATION_SPRAY | RESPIRATORY_TRACT | 3 refills | Status: DC | PRN

## 2017-01-11 NOTE — Patient Instructions (Addendum)
Plan A = Automatic = Symbicort 160 Take 2 puffs first thing in am and then another 2 puffs about 12 hours later and atrovent 2 pffs 4 x daily    Work on inhaler technique:  relax and gently blow all the way out then take a nice smooth deep breath back in, triggering the inhaler at same time you start breathing in.  Hold for up to 5 seconds if you can. Blow out thru nose. Rinse and gargle with water when done     The key is to stop smoking completely before smoking completely stops you!   Plan B = Backup Only use your albuterol (ventolin = proair) as a rescue medication to be used if you can't catch your breath by resting or doing a relaxed purse lip breathing pattern.  - The less you use it, the better it will work when you need it. - Ok to use the inhaler up to 2 puffs  every 4 hours if you must but call for appointment if use goes up over your usual need - Don't leave home without it !!  (think of it like the spare tire for your car)   Plan C = Crisis - only use your albuterol nebulizer if you first try Plan B and it fails to help > ok to use the nebulizer up to every 4 hours but if start needing it regularly call for immediate appointment   Please remember to go to the  x-ray department downstairs in the basement  for your tests - we will call you with the results when they are available.      Please schedule a follow up visit in 6  months but call sooner if needed  with all medications /inhalers/ solutions in hand so we can verify exactly what you are taking. This includes all medications from all doctors and over the counters

## 2017-01-11 NOTE — Progress Notes (Signed)
Subjective:     Patient ID: Michael [name], male   DOB: 1960-10-24,     MRN: [id number]    Brief patient profile:  60 yowm active smoker referred to pulmonary clinic 12/08/2015 by Dr  Ilsa Iha with GOLD III copd documented 08/19/13   History of Present Illness  08/19/13 ov / Michael [name] GOLD III copd  rec Continue symbicort 160 Take 2 puffs first thing in am and then another 2 puffs about 12 hours later.  Only use your  atrovent as a rescue medication Work on inhaler technique:     12/08/2015  Re-establish/ extended  Pulmonary office visit/ Michael [name]  maint rx qvar / atrovent prn Chief Complaint  Patient presents with  . Pulm Consult    Pt. has a history of COPD.   breathing gradually worse x sev months / assoc with coughing > mostly white  rx zpak/ prednisone x 12/04/15 no better  Doe = MMRC3 = can't walk 100 yards even at a slow pace at a flat grade s stopping due to sob   rec Plan A = Automatic = Symbicort 160 Take 2 puffs first thing in am and then another 2 puffs about 12 hours later.                                      Atrovent 2 puffs four times day  Plan B = Backup Only use your albuterol (Ventolin)as a rescue medication  Plan C = Crisis - only use your albuterol nebulizer if you first try Plan B and it fails to help > ok to use the nebulizer up to every 4 hours but if start needing it regularly call for immediate appointment Please remember to go to the lab and x-ray department downstairs for your tests - we will call you with the results when they are available.      01/08/2016  f/u ov/Michael [name] re: copd III/ hypercabic on symb 160 2bid/ atrovent 2qid Chief Complaint  Patient presents with  . Follow-up    Breathing some worse which he relates to cold weather. His cough and wheezing are unchanged. No new co's today. He is using ventolin at least once per day.   only uses ventolin if goes outside in cold/ or going up steps  Rare noct need for saba/no neb form needed at all  sleeps ok flat most  nights s am flares Doe still = MMRC3 rec No change in inhalers Please schedule a follow up visit in 3 months but call sooner if needed with cxr on return      04/11/2016  f/u ov/Michael [name] re: COPD III/ resumed smoking/ symb 160 / atrovent qid / saba  Chief Complaint  Patient presents with  . Follow-up    Seems to be coughing more recently. Cough is prod with clear to "somtimes kinda black" sputum.  He states "the inhalers don't last".  He started smoking again a few months ago 1/2- 1 ppd.   was doing better with cough and ex tol until resumed smoking/ cough worse in am's but clears w/in an hour of stirring  rec For cough, 1st stop smoking  / mucinex dm 1200 mg every 12 hours as needed  Work hard on stopping all smoking before smoking stops you  Work on Musician technique:  Continue symbicort 160 Take 2 puffs first thing in am and then another 2 puffs about 12  hours later and atovent is 2 pffs 2 x daily and additional doses as needed  Plan B = Backup Only use your albuterol (Proair= RED) instead of proventil as a rescue medication  Plan C = Crisis - only use your albuterol nebulizer if you first try Plan B and it fails to help > ok to use the nebulizer up to every 4 hours but if start needing it regularly call for immediate appointment    07/11/2016  f/u ov/Michael [name] re:  GOLD III still smoking maint on symb 160 2bid  Chief Complaint  Patient presents with  . Follow-up    Breathing has improved since the weather has been less humid the past few days. His cough is "worse at times"- prod with clear sputum.  He uses his proair inhaler at least 6 x per day on average.   doe = MMRC3 = can't walk 100 yards even at a slow pace at a flat grade s stopping due to sob   Not using atrovent regularly - has neb but it's "missing a piece" and doesn't know whom to call  rec No change in medications  Work harder on not smoking  Please remember to go to the  x-ray department downstairs in the basement   for your tests - we will call you with the results when they are available.    Please schedule a follow up visit in 6  months but call sooner if needed  with all medications /inhalers/ solutions in hand so we can verify exactly what you are taking. This includes all medications from all doctors and over the counters    01/11/2017  f/u ov/Michael [name] re:  COPD GOLD III/ still smoking/ did not bring meds as rec  Chief Complaint  Patient presents with  . Follow-up    Pt states his breathing seems worse. He "has spells" where he can not catch his breath at all. He states sometimes he gets out of breath with just walking from one room to the next. He is coughing with clear sputum. He has been out of his inhalers and does not have all of the equipment needed to use neb.    worse off symbicort / extremely poor insight into meds  Doe = MMRC3 = can't walk 100 yards even at a slow pace at a flat grade s stopping due to sob    No obvious day to day or daytime variability or assoc excess/ purulent sputum or mucus plugs or hemoptysis or cp or chest tightness, subjective wheeze or overt sinus or hb symptoms. No unusual exposure hx or h/o childhood pna/ asthma or knowledge of premature birth.  Sleeping ok flat without nocturnal  or early am exacerbation  of respiratory  c/o's or need for noct saba. Also denies any obvious fluctuation of symptoms with weather or environmental changes or other aggravating or alleviating factors except as outlined above   Current Allergies, Complete Past Medical History, Past Surgical History, Family History, and Social History were [facility] electronic [facility].  ROS  The following are not active complaints unless bolded Hoarseness, sore throat, dysphagia, dental problems, itching, sneezing,  nasal congestion or discharge of excess mucus or purulent secretions, ear ache,   fever, chills, sweats, unintended wt loss or wt gain, classically pleuritic or exertional  cp,  orthopnea pnd or leg swelling, presyncope, palpitations, abdominal pain, anorexia, nausea, vomiting, diarrhea  or change in bowel habits or change in bladder habits, change in stools or change  in urine, dysuria, hematuria,  rash, arthralgias, visual complaints, headache, numbness, weakness or ataxia or problems with walking or coordination,  change in mood/affect or memory.        Current Meds  Medication Sig  . Acetaminophen (TYLENOL) 325 MG CAPS Take by mouth as needed.  . carbamazepine (CARBATROL) 200 MG 12 hr capsule Take 1 capsule (200 mg total) by mouth daily.  [name] FLUoxetine (PROZAC) 40 MG capsule Take 1 capsule (40 mg total) by mouth daily.  . hydrOXYzine (VISTARIL) 50 MG capsule Take 1 capsule (50 mg total) by mouth daily as needed for anxiety (anxiety and panic).  [name] ipratropium-albuterol (DUONEB) 0.5-2.5 (3) MG/3ML SOLN Take 3 mLs by nebulization every 4 (four) hours as needed (SOB).  . isosorbide mononitrate (IMDUR) 30 MG 24 hr tablet Take 1 tablet (30 mg total) by mouth daily.  . Multiple Vitamin (MULTIVITAMIN WITH MINERALS) TABS tablet Take 1 tablet by mouth daily.  [name] omeprazole (PRILOSEC) 20 MG capsule Take 20 mg by mouth 2 (two) times daily.   [name] oxyCODONE (OXY IR/ROXICODONE) 5 MG immediate release tablet Take 5 mg by mouth 4 (four) times daily as needed for pain.  . prazosin (MINIPRESS) 5 MG capsule Take 5 mg (1 capsule ) at night  . QUEtiapine (SEROQUEL) 200 MG tablet Take 1 tablet (200 mg total) by mouth at bedtime.  [name] QUEtiapine (SEROQUEL) 50 MG tablet TAKE 1 TABLET BY MOUTH EVERY MORNING  . [DISCONTINUED] PROAIR HFA 108 (90 Base) MCG/ACT inhaler INHALE 2 PUFFS BY MOUTH EVERY 6 HOURS AS NEEDED FOR WHEEZING OR SHORTNESS OF BREATH                  Objective:   Physical Exam  amb wm nad abn affect   01/11/2017         139  07/11/2016         144   04/11/2016        147          12/08/15 157 lb 3.2 oz (71.3 kg)  11/04/15 153 lb 12.8 oz (69.8 kg)  08/25/15 160 lb 12.8  oz (72.9 kg)     Vital signs reviewed - Note on arrival 02 sats  96% on RA        HEENT: nl  turbinates bilaterally, and oropharynx. Nl external ear canals without cough reflex- poor dentition    NECK :  without JVD/Nodes/TM/ nl carotid upstrokes bilaterally   LUNGS: no acc muscle use,  slt barrel contour, distant insp and exp rhonchi bilaterally    CV:  RRR  no s3 or murmur or increase in P2, and no edema   ABD:  soft and nontender with nl inspiratory excursion in the supine position. No bruits or organomegaly appreciated, bowel sounds nl  MS:  Nl gait/ ext warm without deformities, calf tenderness, cyanosis or clubbing No obvious joint restrictions   SKIN: warm and dry without lesions    NEURO:  Alert - unusal affect/ with  nl sensorium with  no motor or cerebellar deficits apparent.      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 Aortic atherosclerosis. No active pulmonary disease.    Assessment:

## 2017-01-12 ENCOUNTER — Encounter: Payer: Self-pay | Admitting: Internal Medicine

## 2017-01-12 NOTE — Assessment & Plan Note (Signed)
>   3 min   > 3 min Discussed the risks and costs (both direct and indirect)  of smoking relative to the benefits of quitting but patient unwilling to commit at this point to a specific quit date.      

## 2017-01-12 NOTE — Assessment & Plan Note (Signed)
-   reports quit smokng 06/2013> resumed by ov 04/11/2016   - Spirometry 08/19/2013  FEV1 1.85 ( 47%) ratio 44  - Spirometry 12/08/2015   FEV1 1.53 (43%)  Ratio 40 off maint rx  - 12/08/2015  After extensive coaching HFA effectiveness =    75% > start symb 160/atrovent qid   - Allergy profile 12/08/15  >  Eos 0.0 /  IgE  347  RAST pos dust/ mold - Alpha One AT screen 12/08/15 > MM/ levels ok    - 01/11/2017  After extensive coaching HFA effectiveness =    90%    DDX of  difficult airways management almost all start with A and  include Adherence, Ace Inhibitors, Acid Reflux, Active Sinus Disease, Alpha 1 Antitripsin deficiency, Anxiety masquerading as Airways dz,  ABPA,  Allergy(esp in young), Aspiration (esp in elderly), Adverse effects of meds,  Active smokers, A bunch of PE's (a small clot burden can't cause this syndrome unless there is already severe underlying pulm or vascular dz with poor reserve) plus two Bs  = Bronchiectasis and Beta blocker use..and one C= CHF  Adherence is always the initial "prime suspect" and is a multilayered concern that requires a "trust but verify" approach in every patient - starting with knowing how to use medications, especially inhalers, correctly, keeping up with refills and understanding the fundamental difference between maintenance and prns vs those medications only taken for a very short course and then stopped and not refilled.  - see hfa teaching - needs to return with all meds in hand using a trust but verify approach to confirm accurate Medication  Reconciliation The principal here is that until we are certain that the  patients are doing what we've asked, it makes no sense to ask them to do more.   Active smoking > see above  Alpha one AT def > ruled out   ? Anxiety > usually at the bottom of this list of usual suspects but should be much higher on this pt's based on H and P and note already on psychotropics and likely interfering with d/c cigs/ adherence  issues  ? Bronchiectasis > not seen on last CT 10/2013 reviewed   I had an extended discussion with the patient reviewing all relevant studies completed to date and  lasting 15 to 20 minutes of a 25 minute visit    Each maintenance medication was reviewed in detail including most importantly the difference between maintenance and prns and under what circumstances the prns are to be triggered using an action plan format that is not reflected in the computer generated alphabetically organized AVS.    Please see AVS for specific instructions unique to this visit that I personally wrote and verbalized to the the pt in detail and then reviewed with pt  by my nurse highlighting any  changes in therapy recommended at today's visit to their plan of care.

## 2017-01-12 NOTE — Progress Notes (Signed)
LMTCB

## 2017-02-20 LAB — CUP PACEART INCLINIC DEVICE CHECK
Battery Impedance: 183 Ohm
Battery Voltage: 2.79 V
Brady Statistic AP VS Percent: 38 %
Brady Statistic AS VP Percent: 0 %
Brady Statistic AS VS Percent: 62 %
Date Time Interrogation Session: 20180730163635
Implantable Lead Implant Date: 20050421
Implantable Lead Implant Date: 20050421
Implantable Lead Location: 753859
Implantable Lead Location: 753860
Implantable Lead Model: 5076
Implantable Lead Model: 5076
Lead Channel Impedance Value: 588 Ohm
Lead Channel Pacing Threshold Amplitude: 0.75 V
Lead Channel Pacing Threshold Pulse Width: 0.4 ms
Lead Channel Pacing Threshold Pulse Width: 0.4 ms
Lead Channel Setting Pacing Amplitude: 1.5 V
Lead Channel Setting Pacing Amplitude: 2 V
Lead Channel Setting Pacing Pulse Width: 0.4 ms
MDC IDC MSMT BATTERY REMAINING LONGEVITY: 144 mo
MDC IDC MSMT LEADCHNL RA IMPEDANCE VALUE: 417 Ohm
MDC IDC MSMT LEADCHNL RV PACING THRESHOLD AMPLITUDE: 1 V
MDC IDC PG IMPLANT DT: 20140904
MDC IDC SET LEADCHNL RV SENSING SENSITIVITY: 5.6 mV
MDC IDC STAT BRADY AP VP PERCENT: 0 %

## 2017-03-06 ENCOUNTER — Encounter: Payer: Medicare HMO | Admitting: *Deleted

## 2017-03-06 ENCOUNTER — Telehealth: Payer: Self-pay | Admitting: Cardiology

## 2017-03-06 NOTE — Telephone Encounter (Signed)
LMOVM reminding pt to send remote transmission.   

## 2017-03-09 ENCOUNTER — Encounter: Payer: Self-pay | Admitting: Cardiology

## 2017-03-16 ENCOUNTER — Other Ambulatory Visit: Payer: Self-pay

## 2017-03-16 ENCOUNTER — Emergency Department (HOSPITAL_COMMUNITY): Payer: Medicare HMO

## 2017-03-16 ENCOUNTER — Encounter (HOSPITAL_COMMUNITY): Payer: Self-pay | Admitting: *Deleted

## 2017-03-16 DIAGNOSIS — Z7951 Long term (current) use of inhaled steroids: Secondary | ICD-10-CM | POA: Insufficient documentation

## 2017-03-16 DIAGNOSIS — I10 Essential (primary) hypertension: Secondary | ICD-10-CM | POA: Diagnosis not present

## 2017-03-16 DIAGNOSIS — J209 Acute bronchitis, unspecified: Secondary | ICD-10-CM | POA: Insufficient documentation

## 2017-03-16 DIAGNOSIS — J441 Chronic obstructive pulmonary disease with (acute) exacerbation: Secondary | ICD-10-CM | POA: Insufficient documentation

## 2017-03-16 DIAGNOSIS — Z91013 Allergy to seafood: Secondary | ICD-10-CM | POA: Diagnosis not present

## 2017-03-16 DIAGNOSIS — F418 Other specified anxiety disorders: Secondary | ICD-10-CM | POA: Insufficient documentation

## 2017-03-16 DIAGNOSIS — F17210 Nicotine dependence, cigarettes, uncomplicated: Secondary | ICD-10-CM | POA: Insufficient documentation

## 2017-03-16 DIAGNOSIS — J440 Chronic obstructive pulmonary disease with acute lower respiratory infection: Secondary | ICD-10-CM | POA: Diagnosis not present

## 2017-03-16 DIAGNOSIS — Z79899 Other long term (current) drug therapy: Secondary | ICD-10-CM | POA: Diagnosis not present

## 2017-03-16 DIAGNOSIS — G40909 Epilepsy, unspecified, not intractable, without status epilepticus: Secondary | ICD-10-CM | POA: Diagnosis not present

## 2017-03-16 DIAGNOSIS — K219 Gastro-esophageal reflux disease without esophagitis: Secondary | ICD-10-CM | POA: Diagnosis not present

## 2017-03-16 DIAGNOSIS — J9601 Acute respiratory failure with hypoxia: Secondary | ICD-10-CM | POA: Insufficient documentation

## 2017-03-16 DIAGNOSIS — R0602 Shortness of breath: Secondary | ICD-10-CM | POA: Diagnosis present

## 2017-03-16 DIAGNOSIS — Z950 Presence of cardiac pacemaker: Secondary | ICD-10-CM | POA: Insufficient documentation

## 2017-03-16 DIAGNOSIS — E43 Unspecified severe protein-calorie malnutrition: Secondary | ICD-10-CM | POA: Insufficient documentation

## 2017-03-16 DIAGNOSIS — E039 Hypothyroidism, unspecified: Secondary | ICD-10-CM | POA: Insufficient documentation

## 2017-03-16 DIAGNOSIS — Z6820 Body mass index (BMI) 20.0-20.9, adult: Secondary | ICD-10-CM | POA: Diagnosis not present

## 2017-03-16 LAB — BASIC METABOLIC PANEL
ANION GAP: 11 (ref 5–15)
BUN: 14 mg/dL (ref 6–20)
CALCIUM: 8.8 mg/dL — AB (ref 8.9–10.3)
CHLORIDE: 102 mmol/L (ref 101–111)
CO2: 23 mmol/L (ref 22–32)
Creatinine, Ser: 1.11 mg/dL (ref 0.61–1.24)
GFR calc Af Amer: >60 mL/min (ref >60)
GFR calc non Af Amer: >60 mL/min (ref >60)
Glucose, Bld: 104 mg/dL — ABNORMAL HIGH (ref 65–99)
Potassium: 3.6 mmol/L (ref 3.5–5.1)
Sodium: 136 mmol/L (ref 135–145)

## 2017-03-16 LAB — CBC
HCT: 39.6 % (ref 39.0–52.0)
HEMOGLOBIN: 13.4 g/dL (ref 13.0–17.0)
MCH: 33.8 pg (ref 26.0–34.0)
MCHC: 33.8 g/dL (ref 30.0–36.0)
MCV: 100 fL (ref 78.0–100.0)
Platelets: 199 10*3/uL (ref 150–400)
RBC: 3.96 MIL/uL — ABNORMAL LOW (ref 4.22–5.81)
RDW: 12.9 % (ref 11.5–15.5)
WBC: 5.5 10*3/uL (ref 4.0–10.5)

## 2017-03-16 LAB — I-STAT TROPONIN, ED: TROPONIN I, POC: 0 ng/mL (ref 0.00–0.08)

## 2017-03-16 NOTE — ED Triage Notes (Signed)
Pt has not been feeling well for the past week. Reports having intermittent chest pain with productive cough with clear phlegm. Pt appears generally weak and has had increased sob for a few days with poor PO intake

## 2017-03-17 ENCOUNTER — Encounter (HOSPITAL_COMMUNITY): Payer: Self-pay | Admitting: Nurse Practitioner

## 2017-03-17 ENCOUNTER — Other Ambulatory Visit: Payer: Self-pay

## 2017-03-17 ENCOUNTER — Observation Stay (HOSPITAL_COMMUNITY)
Admission: EM | Admit: 2017-03-17 | Discharge: 2017-03-19 | Disposition: A | Discharge status: Home / Self Care | Payer: Medicare HMO | Attending: Internal Medicine | Admitting: Internal Medicine

## 2017-03-17 DIAGNOSIS — F329 Major depressive disorder, single episode, unspecified: Secondary | ICD-10-CM | POA: Diagnosis present

## 2017-03-17 DIAGNOSIS — J9601 Acute respiratory failure with hypoxia: Secondary | ICD-10-CM

## 2017-03-17 DIAGNOSIS — K219 Gastro-esophageal reflux disease without esophagitis: Secondary | ICD-10-CM | POA: Diagnosis present

## 2017-03-17 DIAGNOSIS — J441 Chronic obstructive pulmonary disease with (acute) exacerbation: Secondary | ICD-10-CM | POA: Diagnosis not present

## 2017-03-17 DIAGNOSIS — F419 Anxiety disorder, unspecified: Secondary | ICD-10-CM | POA: Diagnosis not present

## 2017-03-17 DIAGNOSIS — I10 Essential (primary) hypertension: Secondary | ICD-10-CM | POA: Diagnosis present

## 2017-03-17 DIAGNOSIS — R6889 Other general symptoms and signs: Secondary | ICD-10-CM

## 2017-03-17 DIAGNOSIS — F32A Depression, unspecified: Secondary | ICD-10-CM | POA: Diagnosis present

## 2017-03-17 DIAGNOSIS — A310 Pulmonary mycobacterial infection: Secondary | ICD-10-CM

## 2017-03-17 DIAGNOSIS — E039 Hypothyroidism, unspecified: Secondary | ICD-10-CM | POA: Diagnosis present

## 2017-03-17 DIAGNOSIS — R079 Chest pain, unspecified: Secondary | ICD-10-CM

## 2017-03-17 DIAGNOSIS — G40909 Epilepsy, unspecified, not intractable, without status epilepticus: Secondary | ICD-10-CM

## 2017-03-17 LAB — TSH: TSH: 0.032 u[IU]/mL — ABNORMAL LOW (ref 0.350–4.500)

## 2017-03-17 LAB — I-STAT TROPONIN, ED: TROPONIN I, POC: 0 ng/mL (ref 0.00–0.08)

## 2017-03-17 MED ORDER — HYDROXYZINE HCL 25 MG PO TABS
50.0000 mg | ORAL_TABLET | Freq: Every day | ORAL | Status: DC | PRN
  Administered 2017-03-17 – 2017-03-18 (×2): 50 mg via ORAL
  Filled 2017-03-17 (×5): qty 1

## 2017-03-17 MED ORDER — SODIUM CHLORIDE 0.9 % IV SOLN
Freq: Once | INTRAVENOUS | Status: AC
  Administered 2017-03-17: 125 mL/h via INTRAVENOUS

## 2017-03-17 MED ORDER — PREDNISONE 10 MG PO TABS: 40.0000 mg | ORAL_TABLET | Freq: Every day | ORAL | 0 refills | Status: DC

## 2017-03-17 MED ORDER — MOMETASONE FURO-FORMOTEROL FUM 200-5 MCG/ACT IN AERO
2.0000 | INHALATION_SPRAY | Freq: Two times a day (BID) | RESPIRATORY_TRACT | Status: DC
  Administered 2017-03-18 – 2017-03-19 (×3): 2 via RESPIRATORY_TRACT
  Filled 2017-03-17: qty 8.8

## 2017-03-17 MED ORDER — QUETIAPINE FUMARATE 25 MG PO TABS
200.0000 mg | ORAL_TABLET | Freq: Every day | ORAL | Status: DC
  Administered 2017-03-17 – 2017-03-18 (×2): 200 mg via ORAL
  Filled 2017-03-17: qty 8
  Filled 2017-03-17: qty 1
  Filled 2017-03-17: qty 8

## 2017-03-17 MED ORDER — IPRATROPIUM BROMIDE 0.02 % IN SOLN
1.0000 mg | Freq: Once | RESPIRATORY_TRACT | Status: AC
  Administered 2017-03-17: 1 mg via RESPIRATORY_TRACT
  Filled 2017-03-17: qty 5

## 2017-03-17 MED ORDER — PHENOL 1.4 % MT LIQD
1.0000 | OROMUCOSAL | Status: DC | PRN
  Administered 2017-03-17: 1 via OROMUCOSAL
  Filled 2017-03-17: qty 177

## 2017-03-17 MED ORDER — ENOXAPARIN SODIUM 40 MG/0.4ML ~~LOC~~ SOLN
40.0000 mg | SUBCUTANEOUS | Status: DC
  Administered 2017-03-18: 40 mg via SUBCUTANEOUS
  Filled 2017-03-17 (×2): qty 0.4

## 2017-03-17 MED ORDER — SODIUM CHLORIDE 0.9 % IV BOLUS (SEPSIS)
500.0000 mL | Freq: Once | INTRAVENOUS | Status: AC
  Administered 2017-03-17: 500 mL via INTRAVENOUS

## 2017-03-17 MED ORDER — SODIUM CHLORIDE 0.9% FLUSH
3.0000 mL | Freq: Two times a day (BID) | INTRAVENOUS | Status: DC
  Administered 2017-03-17 – 2017-03-18 (×4): 3 mL via INTRAVENOUS

## 2017-03-17 MED ORDER — CARBAMAZEPINE ER 200 MG PO CP12: 200.0000 mg | ORAL_CAPSULE | Freq: Every day | ORAL | Status: DC

## 2017-03-17 MED ORDER — PRAZOSIN HCL 5 MG PO CAPS: 5.0000 mg | ORAL_CAPSULE | Freq: Every day | ORAL | Status: DC

## 2017-03-17 MED ORDER — ALBUTEROL SULFATE (2.5 MG/3ML) 0.083% IN NEBU
INHALATION_SOLUTION | RESPIRATORY_TRACT | Status: AC
  Administered 2017-03-17: 04:00:00
  Filled 2017-03-17: qty 6

## 2017-03-17 MED ORDER — ONDANSETRON HCL 4 MG/2ML IJ SOLN: 4.0000 mg | Freq: Four times a day (QID) | INTRAMUSCULAR | Status: DC | PRN

## 2017-03-17 MED ORDER — ACETAMINOPHEN 325 MG PO TABS
650.0000 mg | ORAL_TABLET | Freq: Four times a day (QID) | ORAL | Status: DC | PRN
  Administered 2017-03-18: 650 mg via ORAL
  Filled 2017-03-17: qty 2

## 2017-03-17 MED ORDER — METHYLPREDNISOLONE SODIUM SUCC 125 MG IJ SOLR
60.0000 mg | Freq: Two times a day (BID) | INTRAMUSCULAR | Status: DC
  Administered 2017-03-17 – 2017-03-18 (×2): 60 mg via INTRAVENOUS
  Filled 2017-03-17 (×2): qty 2

## 2017-03-17 MED ORDER — QUETIAPINE FUMARATE 25 MG PO TABS
50.0000 mg | ORAL_TABLET | Freq: Every morning | ORAL | Status: DC
  Administered 2017-03-18 – 2017-03-19 (×2): 50 mg via ORAL
  Filled 2017-03-17 (×2): qty 2

## 2017-03-17 MED ORDER — ACETAMINOPHEN 500 MG PO TABS
1000.0000 mg | ORAL_TABLET | Freq: Once | ORAL | Status: AC
  Administered 2017-03-17: 1000 mg via ORAL
  Filled 2017-03-17: qty 2

## 2017-03-17 MED ORDER — GUAIFENESIN ER 600 MG PO TB12
600.0000 mg | ORAL_TABLET | Freq: Two times a day (BID) | ORAL | Status: DC
  Administered 2017-03-17 (×2): 600 mg via ORAL
  Filled 2017-03-17 (×3): qty 1

## 2017-03-17 MED ORDER — ONDANSETRON HCL 4 MG PO TABS: 4.0000 mg | ORAL_TABLET | Freq: Four times a day (QID) | ORAL | Status: DC | PRN

## 2017-03-17 MED ORDER — IPRATROPIUM-ALBUTEROL 0.5-2.5 (3) MG/3ML IN SOLN
3.0000 mL | Freq: Once | RESPIRATORY_TRACT | Status: AC
  Administered 2017-03-17: 3 mL via RESPIRATORY_TRACT
  Filled 2017-03-17: qty 3

## 2017-03-17 MED ORDER — PREDNISONE 20 MG PO TABS
60.0000 mg | ORAL_TABLET | Freq: Once | ORAL | Status: AC
Start: 2017-03-17 — End: 2017-03-17
  Administered 2017-03-17: 60 mg via ORAL
  Filled 2017-03-17: qty 3

## 2017-03-17 MED ORDER — SALINE SPRAY 0.65 % NA SOLN: 1.0000 | NASAL | 0 refills | Status: DC | PRN

## 2017-03-17 MED ORDER — ADULT MULTIVITAMIN W/MINERALS CH
1.0000 | ORAL_TABLET | Freq: Every day | ORAL | Status: DC
  Administered 2017-03-17 – 2017-03-19 (×3): 1 via ORAL
  Filled 2017-03-17 (×3): qty 1

## 2017-03-17 MED ORDER — IPRATROPIUM-ALBUTEROL 0.5-2.5 (3) MG/3ML IN SOLN
3.0000 mL | Freq: Four times a day (QID) | RESPIRATORY_TRACT | Status: DC
  Administered 2017-03-17 – 2017-03-18 (×3): 3 mL via RESPIRATORY_TRACT
  Filled 2017-03-17 (×3): qty 3

## 2017-03-17 MED ORDER — PROMETHAZINE-DM 6.25-15 MG/5ML PO SYRP: 5.0000 mL | ORAL_SOLUTION | Freq: Four times a day (QID) | ORAL | 0 refills | Status: DC | PRN

## 2017-03-17 MED ORDER — PANTOPRAZOLE SODIUM 40 MG PO TBEC
40.0000 mg | DELAYED_RELEASE_TABLET | Freq: Every day | ORAL | Status: DC
  Administered 2017-03-17 – 2017-03-19 (×3): 40 mg via ORAL
  Filled 2017-03-17 (×3): qty 1

## 2017-03-17 MED ORDER — DOXYCYCLINE HYCLATE 100 MG PO CAPS: 100.0000 mg | ORAL_CAPSULE | Freq: Two times a day (BID) | ORAL | 0 refills | Status: DC

## 2017-03-17 MED ORDER — IPRATROPIUM-ALBUTEROL 0.5-2.5 (3) MG/3ML IN SOLN: 3.0000 mL | Freq: Once | RESPIRATORY_TRACT | Status: DC

## 2017-03-17 MED ORDER — FLUOXETINE HCL 20 MG PO CAPS
40.0000 mg | ORAL_CAPSULE | Freq: Every day | ORAL | Status: DC
  Administered 2017-03-18 – 2017-03-19 (×2): 40 mg via ORAL
  Filled 2017-03-17 (×3): qty 2

## 2017-03-17 MED ORDER — ISOSORBIDE MONONITRATE ER 30 MG PO TB24
30.0000 mg | ORAL_TABLET | Freq: Every day | ORAL | Status: DC
  Administered 2017-03-17 – 2017-03-19 (×3): 30 mg via ORAL
  Filled 2017-03-17 (×3): qty 1

## 2017-03-17 MED ORDER — ALBUTEROL (5 MG/ML) CONTINUOUS INHALATION SOLN
10.0000 mg/h | INHALATION_SOLUTION | RESPIRATORY_TRACT | Status: DC
  Administered 2017-03-17: 10 mg/h via RESPIRATORY_TRACT
  Filled 2017-03-17: qty 80

## 2017-03-17 MED ORDER — ACETAMINOPHEN 650 MG RE SUPP: 650.0000 mg | Freq: Four times a day (QID) | RECTAL | Status: DC | PRN

## 2017-03-17 NOTE — ED Notes (Signed)
Pt placed on inpatient bed

## 2017-03-17 NOTE — ED Notes (Signed)
Ambulated on pulse ox.sat was 92% then droped down to 87%  nurse place o2 3l

## 2017-03-17 NOTE — ED Notes (Signed)
Attempted report 

## 2017-03-17 NOTE — ED Provider Notes (Signed)
[facility] EMERGENCY DEPARTMENT Provider Note   CSN: [id number] Arrival date & time: 03/16/17  2054     History   Chief Complaint Chief Complaint  Patient presents with  . Dehydration  . Chest Pain  . Shortness of Breath    HPI Michael [name] is a 57 y.o. male with past medical history of COPD, anxiety/depression, hypothyroidism, anginal pain status post pacemaker insertion, seizure presenting with weeks of increased shortness of breath, 1 week of feeling generally unwell with generalized body aches, cough, congestion, fever.  Patient also reports intermittent chest pain starting yesterday.  Denies any chest pain at this time.  Known ill contacts about a week ago.  Reports being up-to-date on flu immunization.  No abdominal pain, nausea, vomiting, diarrhea.  HPI  Past Medical History:  Diagnosis Date  . Anginal pain ([place])   . Anxiety   . Bradycardia   . Chronic chest pain   . COPD (chronic obstructive pulmonary disease) ([place])   . Depression   . GERD (gastroesophageal reflux disease)   . History of nuclear stress test 06/14/2011   lexiscan; mild perfusion defect in basal inferosetpal & apical inferior region; negative for ischemia   . Hypothyroidism   . Pacemaker   . Pneumonia   . 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 Seizure disorder ([place])   . Seizures ([place])    as a baby  . Shortness of breath   . Tobacco abuse     Patient Active Problem List   Diagnosis Date Noted  . Radius and ulna distal fracture, left, closed, with malunion, subsequent encounter 09/07/2016  . Abnormal nuclear stress test 08/03/2016  . Cigarette smoker 04/13/2016  . PTSD (post-traumatic stress disorder) 01/27/2016  . Major depressive disorder, recurrent episode, moderate ([place]) 01/27/2016  . Chronic respiratory failure with hypercapnia ([place]) 12/09/2015  . Paroxysmal atrial fibrillation ([place]) 10/22/2013  . Sinus node dysfunction ([place]) 10/22/2013    . 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 COPD GOLD III  07/08/2013  . COPD exacerbation ([place]) 06/19/2013  . CAP (community acquired pneumonia) 06/14/2013  . Protein-calorie malnutrition, severe ([place]) 06/14/2013  . Weight loss 09/14/2012  . Symptomatic bradycardia 09/06/2012  . Pacemaker - Medtronic 2005, generator change September 2014 09/06/2012    Past Surgical History:  Procedure Laterality Date  . CARDIAC CATHETERIZATION  2002, 2003   normal coronaries  . EYE SURGERY  [age] y.o.  . GALLBLADDER SURGERY  2003  . HAND SURGERY  1998   x3  . OPEN REDUCTION INTERNAL FIXATION (ORIF) DISTAL RADIAL FRACTURE Right 09/07/2016   Procedure: RIGHT WRIST OPEN REDUCTION INTERNAL FIXATION (ORIF) REPAIR AS INDICATED;  Surgeon: Iran Planas, MD;  Location: [facility];  Service: Orthopedics;  Laterality: Right;  . PACEMAKER INSERTION  04/23/2003   Medtronic Kappa; [facility] - symptomatic bradycardia  . PERMANENT PACEMAKER GENERATOR CHANGE N/A 09/06/2012   Procedure: PERMANENT PACEMAKER GENERATOR CHANGE;  Surgeon: Sanda Klein, MD;  Location: [facility] CATH LAB;  Service: Cardiovascular;  Laterality: N/A;  . TONSILLECTOMY    . TRANSTHORACIC ECHOCARDIOGRAM  05/14/2012   EF 77-41%, normal systolic function; mild MR (ordered for mitral valve disease 424.0)       Home Medications    Prior to Admission medications   Medication Sig Start Date End Date Taking? Authorizing Provider  Acetaminophen (TYLENOL) 325 MG CAPS Take by mouth as needed.    [provider]  albuterol (PROVENTIL HFA;VENTOLIN HFA) 108 (  90 Base) MCG/ACT inhaler Inhale 2 puffs into the lungs every 4 (four) hours as needed for wheezing or shortness of breath. 01/11/17   Tanda Rockers, MD  budesonide-formoterol (SYMBICORT) 160-4.5 MCG/ACT inhaler Take 2 puffs first thing in am and then another 2 puffs about 12 hours later. 01/11/17   Tanda Rockers, MD  carbamazepine (CARBATROL) 200 MG 12 hr capsule Take 1  capsule (200 mg total) by mouth daily. 01/27/16   Norman Clay, MD  doxycycline (VIBRAMYCIN) 100 MG capsule Take 1 capsule (100 mg total) by mouth 2 (two) times daily for 7 days. 03/17/17 03/24/17  Emeline General, PA-C  FLUoxetine (PROZAC) 40 MG capsule Take 1 capsule (40 mg total) by mouth daily. 04/13/16   Eksir, Richard Miu, MD  hydrOXYzine (VISTARIL) 50 MG capsule Take 1 capsule (50 mg total) by mouth daily as needed for anxiety (anxiety and panic). 03/09/16   Aundra Dubin, MD  ipratropium (ATROVENT HFA) 17 MCG/ACT inhaler INHALE 2 PUFFS INTO THE LUNGS 4 TIMES DAILY 01/11/17   Tanda Rockers, MD  ipratropium-albuterol (DUONEB) 0.5-2.5 (3) MG/3ML SOLN Take 3 mLs by nebulization every 4 (four) hours as needed (SOB). 09/02/13   Carlyle Basques, MD  isosorbide mononitrate (IMDUR) 30 MG 24 hr tablet Take 1 tablet (30 mg total) by mouth daily. 04/22/16 04/17/17  Croitoru, Mihai, MD  Multiple Vitamin (MULTIVITAMIN WITH MINERALS) TABS tablet Take 1 tablet by mouth daily.    [provider]  omeprazole (PRILOSEC) 20 MG capsule Take 20 mg by mouth 2 (two) times daily.     [provider]  oxyCODONE (OXY IR/ROXICODONE) 5 MG immediate release tablet Take 5 mg by mouth 4 (four) times daily as needed for pain. 08/23/16   [provider]  prazosin (MINIPRESS) 5 MG capsule Take 5 mg (1 capsule ) at night 04/13/16   Eksir, Richard Miu, MD  predniSONE (DELTASONE) 10 MG tablet Take 4 tablets (40 mg total) by mouth daily for 4 days. 03/17/17 03/21/17  Emeline General, PA-C  promethazine-dextromethorphan (PROMETHAZINE-DM) 6.25-15 MG/5ML syrup Take 5 mLs by mouth 4 (four) times daily as needed for cough. 03/17/17   Avie Echevaria B, PA-C  QUEtiapine (SEROQUEL) 200 MG tablet Take 1 tablet (200 mg total) by mouth at bedtime. 04/13/16   Aundra Dubin, MD  QUEtiapine (SEROQUEL) 50 MG tablet TAKE 1 TABLET BY MOUTH EVERY MORNING 07/26/16   Eksir, Richard Miu, MD  sodium  chloride (OCEAN) 0.65 % SOLN nasal spray Place 1 spray into both nostrils as needed for congestion. 03/17/17   Emeline General, PA-C    Family History Family History  Problem Relation Age of Onset  . Heart Problems Mother        MVP  . CAD Father   . COPD Father   . Heart disease Father   . Heart Problems Other        aunts x 2 died of heart problems   . Heart attack Maternal Grandmother   . Hypertension Maternal Grandfather   . Heart attack Paternal Grandmother   . Heart attack Paternal Grandfather   . Other Sister        crib dealth    Social History Social History   Tobacco Use  . Smoking status: Current Every Day Smoker    Packs/day: 1.00    Years: 40.00    Pack years: 40.00    Types: Cigarettes  . Smokeless tobacco: Never Used  Substance Use Topics  . Alcohol  use: No  . Drug use: No     Allergies   Shellfish allergy   Review of Systems Review of Systems  Constitutional: Positive for chills, diaphoresis and fever.  HENT: Positive for congestion, sinus pressure and sore throat. Negative for ear pain, tinnitus, trouble swallowing and voice change.   Eyes: Negative for photophobia, redness and visual disturbance.  Respiratory: Positive for cough and shortness of breath. Negative for choking, chest tightness, wheezing and stridor.   Cardiovascular: Negative for chest pain, palpitations and leg swelling.  Gastrointestinal: Negative for abdominal distention, abdominal pain, blood in stool, diarrhea, nausea and vomiting.  Musculoskeletal: Positive for myalgias. Negative for gait problem, joint swelling, neck pain and neck stiffness.  Skin: Negative for color change, pallor and rash.  Neurological: Positive for headaches. Negative for dizziness, seizures, syncope, weakness, light-headedness and numbness.     Physical Exam Updated Vital Signs BP 123/82 (BP Location: Right Arm)   Pulse 96   Temp 98.7 F (37.1 C) (Oral)   Resp 16   SpO2 97%   Physical Exam    Constitutional: He is oriented to person, place, and time. He appears well-developed and well-nourished.  Non-toxic appearance. He does not appear ill. No distress.  Afebrile, nontoxic-appearing, sleeping comfortably in no acute distress.  HENT:  Head: Normocephalic and atraumatic.  Eyes: Conjunctivae and EOM are normal.  Neck: Normal range of motion. Neck supple.  Cardiovascular: Normal rate, regular rhythm and normal pulses.  No murmur heard. Pulmonary/Chest: Effort normal. No accessory muscle usage or stridor. No tachypnea. No respiratory distress. He has wheezes.  Diffuse faint wheezing bilaterally.  Abdominal: He exhibits no distension.  Musculoskeletal: Normal range of motion. He exhibits no edema.       Right lower leg: Normal. He exhibits no tenderness and no edema.       Left lower leg: Normal. He exhibits no tenderness and no edema.  Neurological: He is alert and oriented to person, place, and time.  Skin: Skin is warm and dry. No ecchymosis and no rash noted. He is not diaphoretic. No erythema. No pallor.  Psychiatric: He has a normal mood and affect.  Nursing note and vitals reviewed.    ED Treatments / Results  Labs (all labs ordered are listed, but only abnormal results are displayed) Labs Reviewed  BASIC METABOLIC PANEL - Abnormal; Notable for the following components:      Result Value   Glucose, Bld 104 (*)    Calcium 8.8 (*)    All other components within normal limits  CBC - Abnormal; Notable for the following components:   RBC 3.96 (*)    All other components within normal limits  I-STAT TROPONIN, ED  I-STAT TROPONIN, ED    EKG  EKG Interpretation  Date/Time:  Thursday March 16 2017 22:06:05 EDT Ventricular Rate:  84 PR Interval:  128 QRS Duration: 84 QT Interval:  364 QTC Calculation: 430 R Axis:   88 Text Interpretation:  Normal sinus rhythm Right atrial enlargement Confirmed by Dory Horn) on 03/17/2017 4:57:29 AM        Radiology Dg Chest 2 View  Result Date: 03/16/2017 CLINICAL DATA:  Acute onset of intermittent generalized chest pain and productive cough. EXAM: CHEST - 2 VIEW COMPARISON:  Chest radiograph performed 01/11/2017 FINDINGS: The lungs are well-aerated and clear. There is no evidence of focal opacification, pleural effusion or pneumothorax. The heart is normal in size; the patient's pacemaker is noted at the left chest wall, with leads ending  at the right atrium and right ventricle. No acute osseous abnormalities are seen. IMPRESSION: No acute cardiopulmonary process seen. Electronically Signed   By: Garald Balding M.D.   On: 03/16/2017 23:02    Procedures Procedures (including critical care time)  Medications Ordered in ED Medications  predniSONE (DELTASONE) tablet 60 mg (not administered)  albuterol (PROVENTIL) (2.5 MG/3ML) 0.083% nebulizer solution (  Given 03/17/17 0336)  ipratropium-albuterol (DUONEB) 0.5-2.5 (3) MG/3ML nebulizer solution 3 mL (3 mLs Nebulization Given 03/17/17 4142)     Initial Impression / Assessment and Plan / ED Course  I have reviewed the triage vital signs and the nursing notes.  Pertinent labs & imaging results that were available during my care of the patient were reviewed by me and considered in my medical decision making (see chart for details).    Patient presenting with 1 week of flu-like symptoms. Patient has been given nebulizing treatment from triage.Wheezing bilaterally on my initial exam.   On initial assessment he is sleeping comfortably, afebrile nontoxic. Denies chest pain. He is concerned with his breathing and feels like he is "having a panic attack". Ordered second nebulizing treatment and will reassess  EKG unremarkable, negative chest x-ray, negative troponin, Pacemaker interrogated  Patient had normal cath in 2003 with EF of 50-55% and no wall motion abnormalities.  Patient was ambulated in the hall with pulse ox and O2 sats dropping to  87%.  He was put on NL 3L by nursing and reported improvement when on oxygen.  On reassessment after 2nd nebs, patient sleeping comfortably. He reports continued dyspnea and feeling weak. I personally ambulated with patient with O2 sats dropping in high 80s with ambulation.  Patient was given prednisone and continuous nebs without improvement and now with new o2 requirement. Will call for admission for COPD exacerbation.  Discussed strict return precautions and advised to return to the emergency department if experiencing any new or worsening symptoms. Instructions were understood and patient agreed with discharge plan. Final Clinical Impressions(s) / ED Diagnoses   Final diagnoses:  Flu-like symptoms  Chest pain, unspecified type    ED Discharge Orders        Ordered    predniSONE (DELTASONE) 10 MG tablet  Daily     03/17/17 0650    promethazine-dextromethorphan (PROMETHAZINE-DM) 6.25-15 MG/5ML syrup  4 times daily PRN     03/17/17 0650    sodium chloride (OCEAN) 0.65 % SOLN nasal spray  As needed     03/17/17 0650    doxycycline (VIBRAMYCIN) 100 MG capsule  2 times daily     03/17/17 0650       Emeline General, PA-C 03/17/17 1428    Palumbo, April, MD 03/17/17 2313

## 2017-03-17 NOTE — ED Notes (Signed)
Per family pt does not take Carbatrol

## 2017-03-17 NOTE — H&P (Signed)
History and Physical    Michael PontoRoy S Liebig ONG:[id number] DOB: 07/19/1960 DOA: 03/17/2017  **Will place in observation status based on the expectation that the patient will need hospitalization/ hospital care for less than or equal to 24 hours  PCP: Marva PandaMillsaps, Kimberly, NP   Attending physician: Susie CassetteAbrol  Patient coming from/Resides with: Private residence  Chief Complaint: Shortness of breath and generalized weakness  HPI: Michael [name] is a 57 y.o. male with medical history significant for Gold stage III COPD not oxygen dependent, ongoing tobacco abuse, seizure disorder, hypertension, reflux,?  Hypothyroidism, anxiety and depression.  She did initially presented to triage on 3/14 at 10:18 PM running a pleuritic type chest pain with productive cough clear sputum and shortness of breath as well as generalized weakness and poor oral intake.  History marked by patient having no desire to smoke and has not smoked for at least 2 days.  Chest x-ray unremarkable.  No leukocytosis or fever or other signs of infection.  Patient has received multiple nebulizer treatments including a dose of oral steroids but has had persistent military hypoxemia associated with weakness and dizziness.  Patient will be admitted with acute hypoxemia in context of COPD exacerbation likely precipitated by viral syndrome.  ED Course:  Vital Signs: BP 123/82 (BP Location: Right Arm)   Pulse 74   Temp 98.7 F (37.1 C) (Oral)   Resp (!) 28   SpO2 91%  Chest X-ray: Neg Lab data: Sodium 136, potassium 3.6, chloride 102, CO2 23, glucose 104, BUN 14, creatinine 1.11, calcium 8.8, anion gap 11, point-of-care troponin negative x2 collections, white count 5500 differential not obtained, hemoglobin 13.4, platelets 199,000. Medications and treatments: Albuterol neb x1, DuoNeb x2, prednisone 60 mg x1, continuous albuterol neb 10 mg/h x1, Atrovent neb 1 mg x1, normal saline bolus times 500 cc followed by normal saline infusion at 125/hr,  Tylenol 1000 mg x1  Review of Systems:  In addition to the HPI above,  No Headache, changes with Vision or hearing, new weakness, tingling, numbness in any extremity, dizziness, dysarthria or word finding difficulty, gait disturbance or imbalance, tremors or seizure activity No problems swallowing food or Liquids, indigestion/reflux, choking or coughing while eating, abdominal pain with or after eating No palpitations, orthopnea No Abdominal pain, N/V, melena,hematochezia, dark tarry stools, constipation No dysuria, malodorous urine, hematuria or flank pain No new skin rashes, lesions, masses or bruises, No new joint pains, aches, swelling or redness No recent unintentional weight gain or loss No polyuria, polydypsia or polyphagia   Past Medical History:  Diagnosis Date  . Anginal pain (HCC)   . Anxiety   . Bradycardia   . Chronic chest pain   . COPD (chronic obstructive pulmonary disease) (HCC)   . Depression   . GERD (gastroesophageal reflux disease)   . History of nuclear stress test 06/14/2011   lexiscan; mild perfusion defect in basal inferosetpal & apical inferior region; negative for ischemia   . Hypothyroidism   . Pacemaker   . Pneumonia   . 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 Seizure disorder (HCC)   . Seizures (HCC)    as a baby  . Shortness of breath   . Tobacco abuse     Past Surgical History:  Procedure Laterality Date  . CARDIAC CATHETERIZATION  2002, 2003   normal coronaries  . EYE SURGERY  [age] y.o.  . GALLBLADDER SURGERY  2003  . HAND SURGERY  1998   x3  .  OPEN REDUCTION INTERNAL FIXATION (ORIF) DISTAL RADIAL FRACTURE Right 09/07/2016   Procedure: RIGHT WRIST OPEN REDUCTION INTERNAL FIXATION (ORIF) REPAIR AS INDICATED;  Surgeon: Bradly Bienenstock, MD;  Location: [facility] OR;  Service: Orthopedics;  Laterality: Right;  . PACEMAKER INSERTION  04/23/2003   [employer] K[employer]; [facility] - symptomatic bradycardia  .  PERMANENT PACEMAKER GENERATOR CHANGE N/A 09/06/2012   Procedure: PERMANENT PACEMAKER GENERATOR CHANGE;  Surgeon: Thurmon Fair, MD;  Location: [facility] CATH LAB;  Service: Cardiovascular;  Laterality: N/A;  . TONSILLECTOMY    . TRANSTHORACIC ECHOCARDIOGRAM  05/14/2012   EF 50-55%, normal systolic function; mild MR (ordered for mitral valve disease 424.0)    Social History   Socioeconomic History  . Marital status: Divorced    Spouse name: Not on file  . Number of children: 2  . Years of education: 82  . Highest education level: Not on file  Social Needs  . Financial resource strain: Not on file  . Food insecurity - worry: Not on file  . Food insecurity - inability: Not on file  . Transportation needs - medical: Not on file  . Transportation needs - non-medical: Not on file  Occupational History  . Occupation: Disabled  Tobacco Use  . Smoking status: Current Every Day Smoker    Packs/day: 1.00    Years: 40.00    Pack years: 40.00    Types: Cigarettes  . Smokeless tobacco: Never Used  Substance and Sexual Activity  . Alcohol use: No  . Drug use: No  . Sexual activity: Not Currently  Other Topics Concern  . Not on file  Social History Narrative  . Not on file    Mobility: Independent Work history: Not obtained   Allergies  Allergen Reactions  . Shellfish Allergy Anaphylaxis    Family History  Problem Relation Age of Onset  . Heart Problems Mother        MVP  . CAD Father   . COPD Father   . Heart disease Father   . Heart Problems Other        aunts x 2 died of heart problems   . Heart attack Maternal Grandmother   . Hypertension Maternal Grandfather   . Heart attack Paternal Grandmother   . Heart attack Paternal Grandfather   . Other Sister        crib dealth     Prior to Admission medications   Medication Sig Start Date End Date Taking? Authorizing Provider  Acetaminophen (TYLENOL) 325 MG CAPS Take by mouth as needed.   Yes [provider]    albuterol (PROVENTIL HFA;VENTOLIN HFA) 108 (90 Base) MCG/ACT inhaler Inhale 2 puffs into the lungs every 4 (four) hours as needed for wheezing or shortness of breath. 01/11/17  Yes [name], MD  budesonide-formoterol [name]) 160-4.5 MCG/ACT inhaler Take 2 puffs first thing in am and then another 2 puffs about 12 hours later. 01/11/17  Yes [name], MD  FLUoxetine (PROZAC) 40 MG capsule Take 1 capsule (40 mg total) by mouth daily. 04/13/16  Yes Eksir, Bo Mcclintock, MD  fluticasone [facility]) 50 MCG/ACT nasal spray Place 2 sprays into both nostrils as needed.   Yes [provider]  ipratropium (ATROVENT HFA) 17 MCG/ACT inhaler INHALE 2 PUFFS INTO THE LUNGS 4 TIMES DAILY 01/11/17  Yes [name], MD  ipratropium-albuterol (DUONEB) 0.5-2.5 (3) MG/3ML SOLN Take 3 mLs by nebulization every 4 (four) hours as needed (SOB). 09/02/13  Yes Judyann Munson, MD  isosorbide mononitrate (IMDUR) 30 MG 24 hr tablet Take 1 tablet (30 mg total) by mouth daily. 04/22/16 04/17/17 Yes Croitoru, Mihai, MD  Multiple Vitamin (MULTIVITAMIN WITH MINERALS) TABS tablet Take 1 tablet by mouth daily.   Yes [provider]  omeprazole (PRILOSEC) 20 MG capsule Take 20 mg by mouth 2 (two) times daily.    Yes [provider]  QUEtiapine (SEROQUEL) 200 MG tablet Take 1 tablet (200 mg total) by mouth at bedtime. 04/13/16  Yes Burnard Leigh, MD  QUEtiapine (SEROQUEL) 50 MG tablet TAKE 1 TABLET BY MOUTH EVERY MORNING 07/26/16  Yes Eksir, Bo Mcclintock, MD  carbamazepine (CARBATROL) 200 MG 12 hr capsule Take 1 capsule (200 mg total) by mouth daily. 01/27/16   Neysa Hotter, MD  doxycycline (VIBRAMYCIN) 100 MG capsule Take 1 capsule (100 mg total) by mouth 2 (two) times daily for 7 days. 03/17/17 03/24/17  Mathews Robinsons B, PA-C  hydrOXYzine (VISTARIL) 50 MG capsule Take 1 capsule (50 mg total) by mouth daily as needed for anxiety (anxiety and panic). 03/09/16   Eksir, Bo Mcclintock, MD   oxyCODONE (OXY IR/ROXICODONE) 5 MG immediate release tablet Take 5 mg by mouth 4 (four) times daily as needed for pain. 08/23/16   [provider]  prazosin (MINIPRESS) 5 MG capsule Take 5 mg (1 capsule ) at night 04/13/16   Eksir, Bo Mcclintock, MD  predniSONE (DELTASONE) 10 MG tablet Take 4 tablets (40 mg total) by mouth daily for 4 days. 03/17/17 03/21/17  Georgiana Shore, PA-C  promethazine-dextromethorphan (PROMETHAZINE-DM) 6.25-15 MG/5ML syrup Take 5 mLs by mouth 4 (four) times daily as needed for cough. 03/17/17   Mathews Robinsons B, PA-C  sodium chloride (OCEAN) 0.65 % SOLN nasal spray Place 1 spray into both nostrils as needed for congestion. 03/17/17   Georgiana Shore, PA-C    Physical Exam: Vitals:   03/17/17 0256 03/17/17 0838 03/17/17 0913 03/17/17 1038  BP: 123/82     Pulse: 96 74    Resp: 16 (!) 24  (!) 28  Temp:      TempSrc:      SpO2: 97% 99% 98% 91%      Constitutional: NAD, calm, comfortable-pale Eyes: PERRL, lids and conjunctivae normal ENMT: Mucous membranes are dry. Posterior pharynx clear of any exudate or lesions. Poor dentition.  Neck: normal, supple, no masses, no thyromegaly Respiratory: To auscultation posteriorly with a few scattered wheezes somewhat diminished bilateral airway movement. Normal respiratory effort doubt accessory muscle use rest.  Have previously documented exertional hypoxemia with sats decreasing as low as 84-86% on room air. Cardiovascular: Regular rate and rhythm, no murmurs / rubs / gallops. No extremity edema. 2+ pedal pulses. No carotid bruits.  Abdomen: no tenderness, no masses palpated. No hepatosplenomegaly. Bowel sounds positive.  Musculoskeletal: no clubbing / cyanosis. No joint deformity upper and lower extremities. Good ROM, no contractures. Normal muscle tone.  Skin: no rashes, lesions, ulcers. No induration Neurologic: CN 2-12 grossly intact. Sensation intact, DTR normal. Strength 5/5 x all 4 extremities.   Psychiatric: Normal judgment and insight. Alert and oriented x 3. Normal mood.    Labs on Admission: I have personally reviewed following labs and imaging studies  CBC: Recent Labs  Lab 03/16/17 2224  WBC 5.5  HGB 13.4  HCT 39.6  MCV 100.0  PLT 199   Basic Metabolic Panel: Recent Labs  Lab 03/16/17 2224  NA 136  K 3.6  CL 102  CO2 23  GLUCOSE 104*  BUN  14  CREATININE 1.11  CALCIUM 8.8*   GFR: CrCl cannot be calculated (Unknown ideal weight.). Liver Function Tests: No results for input(s): AST, ALT, ALKPHOS, BILITOT, PROT, ALBUMIN in the last 168 hours. No results for input(s): LIPASE, AMYLASE in the last 168 hours. No results for input(s): AMMONIA in the last 168 hours. Coagulation Profile: No results for input(s): INR, PROTIME in the last 168 hours. Cardiac Enzymes: No results for input(s): CKTOTAL, CKMB, CKMBINDEX, TROPONINI in the last 168 hours. BNP (last 3 results) No results for input(s): PROBNP in the last 8760 hours. HbA1C: No results for input(s): HGBA1C in the last 72 hours. CBG: No results for input(s): GLUCAP in the last 168 hours. Lipid Profile: No results for input(s): CHOL, HDL, LDLCALC, TRIG, CHOLHDL, LDLDIRECT in the last 72 hours. Thyroid Function Tests: No results for input(s): TSH, T4TOTAL, FREET4, T3FREE, THYROIDAB in the last 72 hours. Anemia Panel: No results for input(s): VITAMINB12, FOLATE, FERRITIN, TIBC, IRON, RETICCTPCT in the last 72 hours. Urine analysis:    Component Value Date/Time   COLORURINE ORANGE (A) 06/13/2013 2146   APPEARANCEUR CLOUDY (A) 06/13/2013 2146   LABSPEC 1.027 06/13/2013 2146   PHURINE 5.5 06/13/2013 2146   GLUCOSEU NEGATIVE 06/13/2013 2146   HGBUR NEGATIVE 06/13/2013 2146   BILIRUBINUR LARGE (A) 06/13/2013 2146   KETONESUR 15 (A) 06/13/2013 2146   PROTEINUR 30 (A) 06/13/2013 2146   UROBILINOGEN 4.0 (H) 06/13/2013 2146   NITRITE POSITIVE (A) 06/13/2013 2146   LEUKOCYTESUR SMALL (A) 06/13/2013 2146    Sepsis Labs: @LABRCNTIP (procalcitonin:4,lacticidven:4) )No results found for this or any previous visit (from the past 240 hour(s)).   Radiological Exams on Admission: Dg Chest 2 View  Result Date: 03/16/2017 CLINICAL DATA:  Acute onset of intermittent generalized chest pain and productive cough. EXAM: CHEST - 2 VIEW COMPARISON:  Chest radiograph performed 01/11/2017 FINDINGS: The lungs are well-aerated and clear. There is no evidence of focal opacification, pleural effusion or pneumothorax. The heart is normal in size; the patient's pacemaker is noted at the left chest wall, with leads ending at the right atrium and right ventricle. No acute osseous abnormalities are seen. IMPRESSION: No acute cardiopulmonary process seen. Electronically Signed   By: Roanna Raider M.D.   On: 03/16/2017 23:02    EKG: (Independently reviewed) sinus rhythm with ventricular rate 84 bpm, QTC 430 ms, normal R wave rotation, borderline voltage criteria for LVH, no acute ischemic changes.  Assessment/Plan Principal Problem:   Acute respiratory failure with hypoxemia  2/2 COPD with acute exacerbation  -Patient presents with generalized malaise, subjective fevers and chills with productive cough of clear sputum for at least 1 week.  Denies sick contacts.  Associated poor appetite.  Chest x-ray negative for focal pneumonia.  Suspect viral etiology -EDP unsuccessful in ambulating patient on room air double symptomatic hypoxemia as evidenced by shortness of breath, generalized weakness and dizziness -DuoNeb -Solu-Medrol IV 60 mg every 12 hours and transition to oral steroids with taper at discharge -Continue supportive care with oxygen-if maintains hypoxemic may need to discharge home on oxygen -Respiratory viral panel -No indication at this juncture to initiate antibiotics -Mucinex -Continue home Symbicort with therapeutic substitution of Dulera -Continue IV fluid hydration  Active Problems:   Seizure disorder   -Continue home Tegretol/carbamazepine    Anxiety/Depression -Continue preadmission Seroquel, Vistaril as needed, and Prozac    HTN (hypertension) -Continue Imdur and Minipress    ? Hypothyroidism -Synthroid not listed as home medication -Last TSH was obtained in 2014 (0.337) -  Obtain TSH    GERD (gastroesophageal reflux disease) -Continue PPI     **Additional lab, imaging and/or diagnostic evaluation at discretion of supervising physician  DVT prophylaxis: Lovenox Code Status: Full Family Communication: No family at bedside Disposition Plan: Home Consults called: None    Aariyah Sampey L. ANP-BC [facility] Pager [phone]   If 7PM-7AM, please contact night-coverage [email or web address] Password [facility]  03/17/2017, 2:08 PM

## 2017-03-17 NOTE — ED Provider Notes (Signed)
Medical screening examination/treatment/procedure(s) were conducted as a shared visit with non-physician practitioner(s) and myself.  I personally evaluated the patient during the encounter.   EKG Interpretation  Date/Time:  Thursday March 16 2017 22:06:05 EDT Ventricular Rate:  84 PR Interval:  128 QRS Duration: 84 QT Interval:  364 QTC Calculation: 430 R Axis:   88 Text Interpretation:  Normal sinus rhythm Right atrial enlargement Confirmed by Nicanor AlconPalumbo, April ([id number]) on 03/17/2017 4:57:29 AM Also confirmed by Nicanor AlconPalumbo, April ([id number]), editor Elita QuickWatlington, Beverly ([id number])  on 03/17/2017 6:59:22 AM     Patient is a continuous daily smoker, up to 2 packs/day with known COPD.  He has had increasing shortness of breath, much worse over the past week.  Positive for associated body ache cough and congestion.  Patient is using Symbicort inhaler in the mornings.  An albuterol rescue inhaler 4 times a day and an Atrovent inhaler 4+ times per day (he appears to be using the Atrovent more as his rescue inhaler and then albuterol).  Patient does not have a spacer device.  Patient is sleeping quietly as I approached for examination.  He awakens to normal mental status.  Mild increased work of breathing at rest.  Speaking in clear full sentences.  Patient has soft breath sounds with diffuse wheeze.  Regular.  Mental status clear with normal coordination.  Findings consistent with COPD exacerbation.  Chest x-ray does not show pneumonia.  Patient is a continuous smoker.  Symptoms also suggestive of viral illness, possible influenza-like.  Clinically, patient is nontoxic.  We will continue treatment for COPD and reassess for hypoxia with ambulation and dyspnea.   Arby BarrettePfeiffer, Verlia Kaney, MD 03/17/17 [phone]

## 2017-03-17 NOTE — ED Notes (Signed)
Pt approached [employer] desk.  [employer] alerted me to his presence and I pulled him back to triage for reassessment.  Before rising to 97% the pt was maintaining at 92% for a couple of minutes. I alerted Emily-CN who listened to his lungs and decided he needed a breathing treatment.  Pt is receiving that now.

## 2017-03-17 NOTE — ED Notes (Signed)
Pt lying in bed SpO2 at 97% [place] 2 Liters . PT ambulated around pod C maintaining saturation of 93% RM. Pt stated he felt weak. Pt then ambulated to restroom when SpO2 dropped.89%-90% RM  for just a minute. Pt returned to bed and is maintaining saturation of 94% RM.

## 2017-03-17 NOTE — ED Notes (Signed)
[phone] Mother's number if pt has change in plan of care, [name] [phone] to be called if the pt needs transportation home

## 2017-03-18 DIAGNOSIS — J440 Chronic obstructive pulmonary disease with acute lower respiratory infection: Secondary | ICD-10-CM | POA: Diagnosis not present

## 2017-03-18 DIAGNOSIS — J209 Acute bronchitis, unspecified: Secondary | ICD-10-CM | POA: Diagnosis not present

## 2017-03-18 DIAGNOSIS — J441 Chronic obstructive pulmonary disease with (acute) exacerbation: Secondary | ICD-10-CM

## 2017-03-18 DIAGNOSIS — J9601 Acute respiratory failure with hypoxia: Secondary | ICD-10-CM | POA: Diagnosis not present

## 2017-03-18 LAB — BASIC METABOLIC PANEL
Anion gap: 9 (ref 5–15)
BUN: 20 mg/dL (ref 6–20)
CALCIUM: 9 mg/dL (ref 8.9–10.3)
CO2: 27 mmol/L (ref 22–32)
CREATININE: 0.78 mg/dL (ref 0.61–1.24)
Chloride: 101 mmol/L (ref 101–111)
GFR calc non Af Amer: >60 mL/min (ref >60)
Glucose, Bld: 146 mg/dL — ABNORMAL HIGH (ref 65–99)
Potassium: 3.8 mmol/L (ref 3.5–5.1)
SODIUM: 137 mmol/L (ref 135–145)

## 2017-03-18 LAB — CBC
HCT: 36 % — ABNORMAL LOW (ref 39.0–52.0)
Hemoglobin: 12.2 g/dL — ABNORMAL LOW (ref 13.0–17.0)
MCH: 33.3 pg (ref 26.0–34.0)
MCHC: 33.9 g/dL (ref 30.0–36.0)
MCV: 98.4 fL (ref 78.0–100.0)
PLATELETS: 171 10*3/uL (ref 150–400)
RBC: 3.66 MIL/uL — AB (ref 4.22–5.81)
RDW: 12.5 % (ref 11.5–15.5)
WBC: 4.3 10*3/uL (ref 4.0–10.5)

## 2017-03-18 LAB — INFLUENZA PANEL BY PCR (TYPE A & B)
INFLAPCR: POSITIVE — AB
INFLBPCR: NEGATIVE

## 2017-03-18 LAB — HIV ANTIBODY (ROUTINE TESTING W REFLEX): HIV Screen 4th Generation wRfx: NONREACTIVE

## 2017-03-18 MED ORDER — MENTHOL 3 MG MT LOZG: 1.0000 | LOZENGE | OROMUCOSAL | Status: DC | PRN

## 2017-03-18 MED ORDER — PREDNISONE 50 MG PO TABS
50.0000 mg | ORAL_TABLET | Freq: Every day | ORAL | Status: DC
  Administered 2017-03-18 – 2017-03-19 (×2): 50 mg via ORAL
  Filled 2017-03-18 (×2): qty 1

## 2017-03-18 MED ORDER — BENZONATATE 100 MG PO CAPS
100.0000 mg | ORAL_CAPSULE | Freq: Three times a day (TID) | ORAL | Status: DC
  Administered 2017-03-18 – 2017-03-19 (×4): 100 mg via ORAL
  Filled 2017-03-18 (×4): qty 1

## 2017-03-18 MED ORDER — GUAIFENESIN-DM 100-10 MG/5ML PO SYRP
10.0000 mL | ORAL_SOLUTION | ORAL | Status: DC | PRN
  Administered 2017-03-18: 10 mL via ORAL
  Filled 2017-03-18: qty 10

## 2017-03-18 MED ORDER — OXYCODONE HCL 5 MG PO TABS
5.0000 mg | ORAL_TABLET | Freq: Four times a day (QID) | ORAL | Status: DC | PRN
  Administered 2017-03-18: 5 mg via ORAL
  Filled 2017-03-18: qty 1

## 2017-03-18 MED ORDER — PREDNISONE 20 MG PO TABS: 50.0000 mg | ORAL_TABLET | Freq: Every day | ORAL | Status: DC

## 2017-03-18 MED ORDER — GUAIFENESIN ER 600 MG PO TB12
1200.0000 mg | ORAL_TABLET | Freq: Two times a day (BID) | ORAL | Status: DC
  Administered 2017-03-18 – 2017-03-19 (×3): 1200 mg via ORAL
  Filled 2017-03-18 (×3): qty 2

## 2017-03-18 MED ORDER — BOOST PLUS PO LIQD
237.0000 mL | Freq: Three times a day (TID) | ORAL | Status: DC
  Administered 2017-03-18 – 2017-03-19 (×2): 237 mL via ORAL
  Filled 2017-03-18 (×7): qty 237

## 2017-03-18 MED ORDER — DOXYCYCLINE HYCLATE 100 MG PO TABS
100.0000 mg | ORAL_TABLET | Freq: Two times a day (BID) | ORAL | Status: DC
  Administered 2017-03-18 – 2017-03-19 (×2): 100 mg via ORAL
  Filled 2017-03-18 (×2): qty 1

## 2017-03-18 MED ORDER — PREDNISONE 20 MG PO TABS
50.0000 mg | ORAL_TABLET | Freq: Every day | ORAL | Status: DC
  Filled 2017-03-18: qty 1

## 2017-03-18 MED ORDER — NICOTINE 21 MG/24HR TD PT24
21.0000 mg | MEDICATED_PATCH | Freq: Every day | TRANSDERMAL | Status: DC
  Administered 2017-03-18 – 2017-03-19 (×2): 21 mg via TRANSDERMAL
  Filled 2017-03-18 (×2): qty 1

## 2017-03-18 MED ORDER — ALUM & MAG HYDROXIDE-SIMETH 200-200-20 MG/5ML PO SUSP
30.0000 mL | Freq: Four times a day (QID) | ORAL | Status: DC | PRN
  Administered 2017-03-18 – 2017-03-19 (×2): 30 mL via ORAL
  Filled 2017-03-18 (×2): qty 30

## 2017-03-18 MED ORDER — IPRATROPIUM-ALBUTEROL 0.5-2.5 (3) MG/3ML IN SOLN
3.0000 mL | Freq: Three times a day (TID) | RESPIRATORY_TRACT | Status: DC
  Administered 2017-03-18 – 2017-03-19 (×4): 3 mL via RESPIRATORY_TRACT
  Filled 2017-03-18 (×4): qty 3

## 2017-03-18 NOTE — Progress Notes (Signed)
[facility] Progress Note  Patient: Michael [name] AVW:[id number]   PCP: Marva PandaMillsaps, Kimberly, NP DOB: 08/09/1960   DOA: 03/17/2017   DOS: 03/18/2017   Date of Service: the patient was seen and examined on 03/18/2017  Subjective: Complain about sore throat.  No nausea no vomiting.  Still has cough as well as breathing difficulty.  Brief hospital course: Pt. with PMH of COPD, active smoker; admitted on 03/17/2017, presented with complaint of cough and shortness of breath, was found to have COPD exacerbation due to bronchitis. Currently further plan is continue antibiotics.  Assessment and Plan:   Acute respiratory failure with hypoxemia  2/2 COPD with acute exacerbation  -Patient presents with generalized malaise, subjective fevers and chills with productive cough of clear sputum for at least 1 week.  Denies sick contacts.  Associated poor appetite.  Chest x-ray negative for focal pneumonia.  Suspect viral etiology -EDP unsuccessful in ambulating patient on room air double symptomatic hypoxemia as evidenced by shortness of breath, generalized weakness and dizziness -DuoNeb -Solu-Medrol IV 60 mg every 12 hours and transition to oral steroids with taper at discharge -Continue supportive care with oxygen-if maintains hypoxemic may need to discharge home on oxygen -Check for influenza PCR -Starting the patient on doxycycline -Mucinex -Continue home Symbicort with therapeutic substitution of Dulera -Continue IV fluid hydration  Active Problems:   Seizure disorder  -Continue home Tegretol/carbamazepine    Anxiety/Depression -Continue preadmission Seroquel, Vistaril as needed, and Prozac    HTN (hypertension) -Continue Imdur and Minipress    ? Hypothyroidism -Synthroid not listed as home medication -Last TSH was obtained in 2014 (0.337) -Low TSH recheck free T4    GERD (gastroesophageal   Diet: Regular diet DVT Prophylaxis: subcutaneous Heparin  Advance goals of care discussion:  Full code  Family Communication: no family was present at bedside, at the time of interview.   Disposition:  Discharge to home.  Consultants: none Procedures: none  Antibiotics: Anti-infectives (From admission, onward)   Start     Dose/Rate Route Frequency Ordered Stop   03/17/17 0000  doxycycline (VIBRAMYCIN) 100 MG capsule     100 mg Oral 2 times daily 03/17/17 0650 03/24/17 2359       Objective: Physical Exam: Vitals:   03/18/17 0932 03/18/17 0934 03/18/17 1405 03/18/17 1421  BP:   128/65   Pulse:   82 95  Resp:   18 16  Temp:   98.3 F (36.8 C)   TempSrc:   Oral   SpO2: 95% 97% 94% 92%  Weight:      Height:        Intake/Output Summary (Last 24 hours) at 03/18/2017 1558 Last data filed at 03/18/2017 0504 Gross per 24 hour  Intake 600 ml  Output -  Net 600 ml   Filed Weights   03/17/17 1902 03/18/17 0459  Weight: 59.1 kg (130 lb 4.7 oz) 61.7 kg (136 lb 0.4 oz)   General: Alert, Awake and Oriented to Time, Place and Person. Appear in moderate distress, affect appropriateEyes: PERRL, Conjunctiva normal ENT: Oral Mucosa clear moist. Neck: no JVD, no Abnormal Mass Or lumps Cardiovascular: S1 and S2 Present, no Murmur, Peripheral Pulses Present Respiratory: increased respiratory effort, Bilateral Air entry equal and Decreased, no use of accessory muscle, basal Crackles, bilateral  wheezes Abdomen: Bowel Sound present, Soft and no tenderness, no hernia Skin: no redness, no Rash, no induration Extremities: no Pedal edema, no calf tenderness Neurologic: Grossly no focal neuro deficit. Bilaterally Equal motor strength  Data Reviewed: CBC: Recent Labs  Lab 03/16/17 2224 03/18/17 0254  WBC 5.5 4.3  HGB 13.4 12.2*  HCT 39.6 36.0*  MCV 100.0 98.4  PLT 199 171   Basic Metabolic Panel: Recent Labs  Lab 03/16/17 2224 03/18/17 0254  NA 136 137  K 3.6 3.8  CL 102 101  CO2 23 27  GLUCOSE 104* 146*  BUN 14 20  CREATININE 1.11 0.78  CALCIUM 8.8* 9.0     Liver Function Tests: No results for input(s): AST, ALT, ALKPHOS, BILITOT, PROT, ALBUMIN in the last 168 hours. No results for input(s): LIPASE, AMYLASE in the last 168 hours. No results for input(s): AMMONIA in the last 168 hours. Coagulation Profile: No results for input(s): INR, PROTIME in the last 168 hours. Cardiac Enzymes: No results for input(s): CKTOTAL, CKMB, CKMBINDEX, TROPONINI in the last 168 hours. BNP (last 3 results) No results for input(s): PROBNP in the last 8760 hours. CBG: No results for input(s): GLUCAP in the last 168 hours. Studies: No results found.  Scheduled Meds: . benzonatate  100 mg Oral TID  . enoxaparin (LOVENOX) injection  40 mg Subcutaneous Q24H  . FLUoxetine  40 mg Oral Daily  . guaiFENesin  1,200 mg Oral BID  . ipratropium-albuterol  3 mL Nebulization TID  . isosorbide mononitrate  30 mg Oral Daily  . lactose free nutrition  237 mL Oral TID WC  . mometasone-formoterol  2 puff Inhalation BID  . multivitamin with minerals  1 tablet Oral Daily  . nicotine  21 mg Transdermal Daily  . pantoprazole  40 mg Oral Daily  . predniSONE  50 mg Oral Q breakfast  . QUEtiapine  200 mg Oral QHS  . QUEtiapine  50 mg Oral q morning - 10a  . sodium chloride flush  3 mL Intravenous Q12H   Continuous Infusions: . albuterol 10 mg/hr (03/17/17 0912)   PRN Meds: acetaminophen **OR** acetaminophen, hydrOXYzine, menthol-cetylpyridinium, ondansetron **OR** ondansetron (ZOFRAN) IV, phenol  Time spent: 35 minutes  Author: Lynden Oxford, MD [facility] Pager: [phone] 03/18/2017 3:58 PM  If 7PM-7AM, please contact night-coverage at [email or web address], password [facility]

## 2017-03-18 NOTE — Plan of Care (Signed)
Patient was educated on current medications and informed he tested positive for the flu and will continue to stay on droplet precautions. Patient verbalizes understanding. Will continue to educate and monitor patient.

## 2017-03-18 NOTE — Progress Notes (Addendum)
Initial Nutrition Assessment  DOCUMENTATION CODES:   Severe malnutrition in context of chronic illness  INTERVENTION:   Recommend vitamin C 238m po BID  Boost Plus chocolate TID- Each supplement provides 360kcal and 14g protein.    MVI daily  Magic cup TID with meals, each supplement provides 290 kcal and 9 grams of protein  NUTRITION DIAGNOSIS:   Severe Malnutrition related to catabolic illness(COPD) as evidenced by severe fat depletion, severe muscle depletion.  GOAL:   Patient will meet greater than or equal to 90% of their needs  MONITOR:   PO intake, Supplement acceptance, Labs, Weight trends, I & O's, Skin  REASON FOR ASSESSMENT:   Consult COPD Protocol  ASSESSMENT:   57year old male active smoker, patient of Dr. MChristinia [name] history of COPD gold stage III since August 2015, previously found to be negative for MAC on sputum culture, on no home oxygen, who presents to the ED today with shortness of breath and generalized weakness.   Met with pt in room today. Pt reports decreased appetite and oral intake for several weeks pta. Pt reports that he does drink chocolate Boost at home but not regularly. Per chart, pt with 11lb(7%) weight loss over the past year; this is not significant given the time frame. Pt noted to have ecchymosis on bilateral arms. Pt reports that he has these "blood spots" that pop up and reports that he brusises really easily. Pt also reports recent hair loss. Pt is at high risk for vitamin C deficiency given his chronic smoking and poor oral intake. Recommend vitamin C supplementation. Michael [name] will order supplements and snacks. Pt reports eating an omelet and some bacon for breakfast today.   Medications reviewed and include: lovenox, MVI, nicotine, protonix, prednisone  Labs reviewed:   NUTRITION - FOCUSED PHYSICAL EXAM:    Most Recent Value  Orbital Region  Mild depletion  Upper Arm Region  Moderate depletion  Thoracic and Lumbar Region   Moderate depletion  Buccal Region  Mild depletion  Temple Region  Moderate depletion  Clavicle Bone Region  Severe depletion  Clavicle and Acromion Bone Region  Severe depletion  Scapular Bone Region  Moderate depletion  Dorsal Hand  Moderate depletion  Patellar Region  Severe depletion  Anterior Thigh Region  Severe depletion  Posterior Calf Region  Severe depletion  Edema (Michael [name] Assessment)  None  Hair  Reviewed  Eyes  Reviewed  Mouth  Reviewed  Skin  Reviewed  Nails  Reviewed       Diet Order:  Diet regular Room service appropriate? Yes; Fluid consistency: Thin  EDUCATION NEEDS:   Education needs have been addressed  Skin:  Reviewed RN Assessment  Last BM:  3/14  Height:   Ht Readings from Last 1 Encounters:  03/17/17 _0  (1.753 m)    Weight:   Wt Readings from Last 1 Encounters:  03/18/17 136 lb 0.4 oz (61.7 kg)    Ideal Body Weight:  72.7 kg  BMI:  Body mass index is 20.09 kg/m.  Estimated Nutritional Needs:   Kcal:  1900-2200kcal/day   Protein:  93-105g/day   Fluid:  >1.5L/day   CKoleen [name], Michael [name], LDN Pager #[phone] Hours Pager: [phone]

## 2017-03-19 DIAGNOSIS — J441 Chronic obstructive pulmonary disease with (acute) exacerbation: Secondary | ICD-10-CM | POA: Diagnosis not present

## 2017-03-19 MED ORDER — OSELTAMIVIR PHOSPHATE 75 MG PO CAPS
75.0000 mg | ORAL_CAPSULE | Freq: Two times a day (BID) | ORAL | Status: DC
  Administered 2017-03-19: 75 mg via ORAL
  Filled 2017-03-19: qty 1

## 2017-03-19 MED ORDER — NICOTINE 21 MG/24HR TD PT24: 21.0000 mg | MEDICATED_PATCH | Freq: Every day | TRANSDERMAL | 0 refills | Status: DC

## 2017-03-19 MED ORDER — BOOST PLUS PO LIQD: 237.0000 mL | Freq: Three times a day (TID) | ORAL | 0 refills | Status: DC

## 2017-03-19 MED ORDER — OSELTAMIVIR PHOSPHATE 75 MG PO CAPS: 75.0000 mg | ORAL_CAPSULE | Freq: Two times a day (BID) | ORAL | 0 refills | Status: AC

## 2017-03-19 MED ORDER — GUAIFENESIN ER 600 MG PO TB12: 600.0000 mg | ORAL_TABLET | Freq: Two times a day (BID) | ORAL | Status: DC

## 2017-03-19 MED ORDER — TIOTROPIUM BROMIDE MONOHYDRATE 18 MCG IN CAPS: 18.0000 ug | ORAL_CAPSULE | Freq: Every day | RESPIRATORY_TRACT | 2 refills | Status: DC

## 2017-03-19 MED ORDER — ALBUTEROL SULFATE HFA 108 (90 BASE) MCG/ACT IN AERS: 2.0000 | INHALATION_SPRAY | RESPIRATORY_TRACT | 0 refills | Status: DC | PRN

## 2017-03-19 MED ORDER — DOXYCYCLINE HYCLATE 100 MG PO CAPS: 100.0000 mg | ORAL_CAPSULE | Freq: Two times a day (BID) | ORAL | 0 refills | Status: AC

## 2017-03-19 MED ORDER — BENZONATATE 100 MG PO CAPS: 100.0000 mg | ORAL_CAPSULE | Freq: Three times a day (TID) | ORAL | 0 refills | Status: DC

## 2017-03-19 MED ORDER — PREDNISONE 10 MG PO TABS: ORAL_TABLET | ORAL | 0 refills | Status: DC

## 2017-03-19 MED ORDER — MENTHOL 3 MG MT LOZG: 1.0000 | LOZENGE | OROMUCOSAL | 12 refills | Status: DC | PRN

## 2017-03-19 MED ORDER — BUDESONIDE-FORMOTEROL FUMARATE 160-4.5 MCG/ACT IN AERO: INHALATION_SPRAY | RESPIRATORY_TRACT | 0 refills | Status: DC

## 2017-03-19 NOTE — Progress Notes (Signed)
SATURATION QUALIFICATIONS: (This note is used to comply with regulatory documentation for home oxygen)  Patient Saturations on Room Air at Rest = 95%  Patient Saturations on Room Air while Ambulating = 89%   Please briefly explain why patient needs home oxygen: 

## 2017-03-19 NOTE — Progress Notes (Signed)
Patient given and understands discharge instructions. Removed IV and cardiac monitor and taken to main entrance via wheelchair.

## 2017-03-20 NOTE — Discharge Summary (Addendum)
[facility] Discharge Summary   Patient: Michael [name] ZOX:[id number]   PCP: Marva Panda, NP DOB: 1960-10-24   Date of admission: 03/17/2017   Date of discharge: 03/19/2017   Discharge Diagnoses:  Principal Problem:   COPD with acute exacerbation (HCC) Active Problems:   Seizure disorder (HCC)   Hypothyroidism   GERD (gastroesophageal reflux disease)   Anxiety   Depression   HTN (hypertension)   Acute respiratory failure with hypoxemia (HCC)   Admitted From: home Disposition:  home  Recommendations for Outpatient Follow-up:  1. Please follow up with PCP in 1 week   Follow-up Information    Schedule an appointment as soon as possible for a visit with Marva Panda, NP.   Why:  for follow up Contact information: [facility] [address] [place] [address] [phone]        Go to [facility] EMERGENCY DEPARTMENT.   Specialty:  Emergency Medicine Why:  If symptoms worsen Contact information: [address] [id number] mc [place] [address] [phone]         Diet recommendation: cardiac diet  Activity: The patient is advised to gradually reintroduce usual activities.  Discharge Condition: good  Code Status: full code  History of present illness: As per the H and P dictated on admission, " Michael [name] is a 57 y.o. male with medical history significant for Gold stage III COPD not oxygen dependent, ongoing tobacco abuse, seizure disorder, hypertension, reflux,?  Hypothyroidism, anxiety and depression.  She did initially presented to triage on 3/14 at 10:18 PM running a pleuritic type chest pain with productive cough clear sputum and shortness of breath as well as generalized weakness and poor oral intake.  History marked by patient having no desire to smoke and has not smoked for at least 2 days.  Chest x-ray unremarkable.  No leukocytosis or fever or other signs of infection.  Patient has  received multiple nebulizer treatments including a dose of oral steroids but has had persistent military hypoxemia associated with weakness and dizziness.  Patient will be admitted with acute hypoxemia in context of COPD exacerbation likely precipitated by viral syndrome."  Hospital Course:  Summary of his active problems in the hospital is as following. Acute respiratory failure with hypoxemia 2/2COPD with acute exacerbationdue to influenza -Patient presents with generalized malaise, subjective fevers and chills with productive cough of clear sputum for at least 1 week. Denies sick contacts. Associated poor appetite. Chest x-ray negative for focal pneumonia. -EDP unsuccessfulinambulating patient on room air double symptomatic hypoxemia as evidenced by shortness of breath, generalized weakness and dizziness, oxygenation improved on ambulation after a 1 day stay in the hospital with scheduled nebulizers and IV steroids. Influenza PCR was positive. Started on Tamiflu. Continue prednisone on discharge. Continue Mucinex. Also will treat the patient with doxycycline for secondary bacterial infection.  Active Problems: Seizure disorder  -Continue home Tegretol/carbamazepine  Anxiety/Depression -Continue preadmission Seroquel, Vistaril as needed, and Prozac  HTN (hypertension) -ContinueImdurand Minipress  ?Hypothyroidism -Synthroid not listed as home medication -Recommend recheck with PCP in 1-2 weeks once the patient is stable from his current condition  GERD   All other chronic medical condition were stable during the hospitalization.  Patient was ambulatory without any assistance. On the day of the discharge the patient's vitals were stable, and no other acute medical condition were reported by patient. the patient was felt safe to be discharge at home with family.  Procedures and Results:  none  Consultations:  none  DISCHARGE MEDICATION: Allergies  as of 03/19/2017      Reactions   Shellfish Allergy Anaphylaxis      Medication List    STOP taking these medications   ipratropium 17 MCG/ACT inhaler Commonly known as:  ATROVENT HFA     TAKE these medications   albuterol 108 (90 Base) MCG/ACT inhaler Commonly known as:  PROVENTIL HFA;VENTOLIN HFA Inhale 2 puffs into the lungs every 4 (four) hours as needed for wheezing or shortness of breath.   benzonatate 100 MG capsule Commonly known as:  TESSALON Take 1 capsule (100 mg total) by mouth 3 (three) times daily.   budesonide-formoterol 160-4.5 MCG/ACT inhaler Commonly known as:  SYMBICORT Take 2 puffs first thing in am and then another 2 puffs about 12 hours later.   carbamazepine 200 MG 12 hr capsule Commonly known as:  CARBATROL Take 1 capsule (200 mg total) by mouth daily.   doxycycline 100 MG capsule Commonly known as:  VIBRAMYCIN Take 1 capsule (100 mg total) by mouth 2 (two) times daily for 5 days.   FLUoxetine 40 MG capsule Commonly known as:  PROZAC Take 1 capsule (40 mg total) by mouth daily.   fluticasone 50 MCG/ACT nasal spray Commonly known as:  FLONASE Place 2 sprays into both nostrils as needed.   hydrOXYzine 50 MG capsule Commonly known as:  VISTARIL Take 1 capsule (50 mg total) by mouth daily as needed for anxiety (anxiety and panic).   ipratropium-albuterol 0.5-2.5 (3) MG/3ML Soln Commonly known as:  DUONEB Take 3 mLs by nebulization every 4 (four) hours as needed (SOB).   isosorbide mononitrate 30 MG 24 hr tablet Commonly known as:  IMDUR Take 1 tablet (30 mg total) by mouth daily.   lactose free nutrition Liqd Take 237 mLs by mouth 3 (three) times daily with meals.   menthol-cetylpyridinium 3 MG lozenge Commonly known as:  CEPACOL Take 1 lozenge (3 mg total) by mouth as needed for sore throat.   multivitamin with minerals Tabs tablet Take 1 tablet by mouth daily.   nicotine 21 mg/24hr patch Commonly known as:  NICODERM CQ - dosed in  mg/24 hours Place 1 patch (21 mg total) onto the skin daily.   omeprazole 20 MG capsule Commonly known as:  PRILOSEC Take 20 mg by mouth 2 (two) times daily.   oseltamivir 75 MG capsule Commonly known as:  TAMIFLU Take 1 capsule (75 mg total) by mouth 2 (two) times daily for 5 days.   oxyCODONE 5 MG immediate release tablet Commonly known as:  Oxy IR/ROXICODONE Take 5 mg by mouth 4 (four) times daily as needed for pain.   prazosin 5 MG capsule Commonly known as:  MINIPRESS Take 5 mg (1 capsule ) at night   predniSONE 10 MG tablet Commonly known as:  DELTASONE Take 40mg  daily for 3days,Take 30mg  daily for 3days,Take 20mg  daily for 3days,Take 10mg  daily for 3days, then stop.   promethazine-dextromethorphan 6.25-15 MG/5ML syrup Commonly known as:  PROMETHAZINE-DM Take 5 mLs by mouth 4 (four) times daily as needed for cough.   QUEtiapine 200 MG tablet Commonly known as:  SEROQUEL Take 1 tablet (200 mg total) by mouth at bedtime.   QUEtiapine 50 MG tablet Commonly known as:  SEROQUEL TAKE 1 TABLET BY MOUTH EVERY MORNING   sodium chloride 0.65 % Soln nasal spray Commonly known as:  OCEAN Place 1 spray into both nostrils as needed for congestion.   tiotropium 18 MCG inhalation capsule Commonly known  as:  SPIRIVA HANDIHALER Place 1 capsule (18 mcg total) into inhaler and inhale daily.   TYLENOL 325 MG Caps Generic drug:  Acetaminophen Take by mouth as needed.      Allergies  Allergen Reactions  . Shellfish Allergy Anaphylaxis   Discharge Instructions    Diet - low sodium heart healthy   Complete by:  As directed    Discharge instructions   Complete by:  As directed    It is important that you read following instructions as well as go over your medication list with RN to help you understand your care after this hospitalization.  Discharge Instructions: Please follow-up with PCP in one week  Please request your primary care physician to go over all Hospital Tests  and Procedure/Radiological results at the follow up,  Please get all Hospital records sent to your PCP by signing hospital release before you go home.   Do not take more than prescribed Pain, Sleep and Anxiety Medications. You were cared for by a hospitalist during your hospital stay. If you have any questions about your discharge medications or the care you received while you were in the hospital after you are discharged, you can call the unit and ask to speak with the hospitalist on call if the hospitalist that took care of you is not available.  Once you are discharged, your primary care physician will handle any further medical issues. Please note that NO REFILLS for any discharge medications will be authorized once you are discharged, as it is imperative that you return to your primary care physician (or establish a relationship with a primary care physician if you do not have one) for your aftercare needs so that they can reassess your need for medications and monitor your lab values. You Must read complete instructions/literature along with all the possible adverse reactions/side effects for all the Medicines you take and that have been prescribed to you. Take any new Medicines after you have completely understood and accept all the possible adverse reactions/side effects. Wear Seat belts while driving. If you have smoked or chewed Tobacco in the last 2 yrs please stop smoking and/or stop any Recreational drug use.   Increase activity slowly   Complete by:  As directed      Discharge Exam: Filed Weights   03/17/17 1902 03/18/17 0459 03/19/17 0427  Weight: 59.1 kg (130 lb 4.7 oz) 61.7 kg (136 lb 0.4 oz) 63.4 kg (139 lb 12.4 oz)   Vitals:   03/19/17 0448 03/19/17 0742  BP: 114/66   Pulse: 88   Resp: 18   Temp: 97.6 F (36.4 C)   SpO2: 96% 95%   General: Appear in no distress, no Rash; Oral Mucosa moist Cardiovascular: S1 and S2 Present, no Murmur, no JVD Respiratory: Bilateral Air  entry present and no Crackles, Occasional  wheezes Abdomen: Bowel Sound present, Soft and no tenderness Extremities: no Pedal edema, no calf tenderness Neurology: Grossly no focal neuro deficit.  The results of significant diagnostics from this hospitalization (including imaging, microbiology, ancillary and laboratory) are listed below for reference.    Significant Diagnostic Studies: Dg Chest 2 View  Result Date: 03/16/2017 CLINICAL DATA:  Acute onset of intermittent generalized chest pain and productive cough. EXAM: CHEST - 2 VIEW COMPARISON:  Chest radiograph performed 01/11/2017 FINDINGS: The lungs are well-aerated and clear. There is no evidence of focal opacification, pleural effusion or pneumothorax. The heart is normal in size; the patient's pacemaker is noted at the left chest wall,  with leads ending at the right atrium and right ventricle. No acute osseous abnormalities are seen. IMPRESSION: No acute cardiopulmonary process seen. Electronically Signed   By: Roanna Raider M.D.   On: 03/16/2017 23:02    Microbiology: No results found for this or any previous visit (from the past 240 hour(s)).   Labs: CBC: Recent Labs  Lab 03/16/17 2224 03/18/17 0254  WBC 5.5 4.3  HGB 13.4 12.2*  HCT 39.6 36.0*  MCV 100.0 98.4  PLT 199 171   Basic Metabolic Panel: Recent Labs  Lab 03/16/17 2224 03/18/17 0254  NA 136 137  K 3.6 3.8  CL 102 101  CO2 23 27  GLUCOSE 104* 146*  BUN 14 20  CREATININE 1.11 0.78  CALCIUM 8.8* 9.0   Time spent: 35 minutes  Signed:  Lynden Oxford  [facility] 03/19/2017 , 8:06 AM

## 2017-04-17 ENCOUNTER — Other Ambulatory Visit (HOSPITAL_COMMUNITY): Payer: Self-pay | Admitting: Psychiatry

## 2017-04-17 ENCOUNTER — Other Ambulatory Visit: Payer: Self-pay | Admitting: Cardiovascular Disease

## 2017-04-17 DIAGNOSIS — F4310 Post-traumatic stress disorder, unspecified: Secondary | ICD-10-CM

## 2017-04-17 DIAGNOSIS — F331 Major depressive disorder, recurrent, moderate: Secondary | ICD-10-CM

## 2017-04-17 NOTE — Telephone Encounter (Signed)
Rx has been sent to the pharmacy electronically. ° °

## 2017-04-18 ENCOUNTER — Other Ambulatory Visit: Payer: Self-pay | Admitting: Internal Medicine

## 2017-04-18 MED ORDER — IPRATROPIUM BROMIDE HFA 17 MCG/ACT IN AERS: 2.0000 | INHALATION_SPRAY | RESPIRATORY_TRACT | 1 refills | Status: DC | PRN

## 2017-04-24 ENCOUNTER — Other Ambulatory Visit: Payer: Self-pay

## 2017-04-24 DIAGNOSIS — Z13220 Encounter for screening for lipoid disorders: Secondary | ICD-10-CM

## 2017-06-01 ENCOUNTER — Ambulatory Visit (INDEPENDENT_AMBULATORY_CARE_PROVIDER_SITE_OTHER): Payer: Medicare HMO | Admitting: *Deleted

## 2017-06-01 DIAGNOSIS — I495 Sick sinus syndrome: Secondary | ICD-10-CM

## 2017-06-02 NOTE — Progress Notes (Signed)
Remote pacemaker transmission.   

## 2017-06-05 ENCOUNTER — Encounter: Payer: Self-pay | Admitting: Cardiology

## 2017-06-13 LAB — CUP PACEART REMOTE DEVICE CHECK
Date Time Interrogation Session: 20190611213418
Implantable Lead Implant Date: 20050421
Implantable Lead Implant Date: 20050421
Implantable Lead Location: 753860
Implantable Pulse Generator Implant Date: 20140904
Lead Channel Setting Pacing Amplitude: 1.5 V
Lead Channel Setting Pacing Pulse Width: 0.4 ms
MDC IDC LEAD LOCATION: 753859
MDC IDC SET LEADCHNL RV PACING AMPLITUDE: 2 V
MDC IDC SET LEADCHNL RV SENSING SENSITIVITY: 5.6 mV

## 2017-07-03 ENCOUNTER — Other Ambulatory Visit: Payer: Self-pay | Admitting: Internal Medicine

## 2017-07-03 MED ORDER — ALBUTEROL SULFATE HFA 108 (90 BASE) MCG/ACT IN AERS: 2.0000 | INHALATION_SPRAY | RESPIRATORY_TRACT | 0 refills | Status: DC | PRN

## 2017-07-11 ENCOUNTER — Ambulatory Visit: Payer: Medicare HMO | Admitting: Internal Medicine

## 2017-07-11 ENCOUNTER — Encounter: Payer: Self-pay | Admitting: Internal Medicine

## 2017-07-11 ENCOUNTER — Ambulatory Visit (INDEPENDENT_AMBULATORY_CARE_PROVIDER_SITE_OTHER)
Admission: RE | Admit: 2017-07-11 | Discharge: 2017-07-11 | Disposition: A | Discharge status: Home / Self Care | Payer: Medicare HMO | Source: Ambulatory Visit | Attending: Internal Medicine | Admitting: Internal Medicine

## 2017-07-11 VITALS — BP 116/74 | HR 87 | Ht 69.0 in | Wt 125.2 lb

## 2017-07-11 DIAGNOSIS — J449 Chronic obstructive pulmonary disease, unspecified: Secondary | ICD-10-CM

## 2017-07-11 DIAGNOSIS — F17210 Nicotine dependence, cigarettes, uncomplicated: Secondary | ICD-10-CM

## 2017-07-11 DIAGNOSIS — J9612 Chronic respiratory failure with hypercapnia: Secondary | ICD-10-CM | POA: Diagnosis not present

## 2017-07-11 MED ORDER — ALBUTEROL SULFATE HFA 108 (90 BASE) MCG/ACT IN AERS: 2.0000 | INHALATION_SPRAY | RESPIRATORY_TRACT | 1 refills | Status: DC | PRN

## 2017-07-11 MED ORDER — BUDESONIDE-FORMOTEROL FUMARATE 160-4.5 MCG/ACT IN AERO: INHALATION_SPRAY | RESPIRATORY_TRACT | 11 refills | Status: DC

## 2017-07-11 MED ORDER — TIOTROPIUM BROMIDE MONOHYDRATE 18 MCG IN CAPS: 18.0000 ug | ORAL_CAPSULE | Freq: Every day | RESPIRATORY_TRACT | 11 refills | Status: DC

## 2017-07-11 MED ORDER — ALBUTEROL SULFATE 1.25 MG/3ML IN NEBU: 1.0000 | INHALATION_SOLUTION | Freq: Four times a day (QID) | RESPIRATORY_TRACT | 1 refills | Status: DC | PRN

## 2017-07-11 NOTE — Assessment & Plan Note (Addendum)
4-5 min discussion re active cigarette smoking in addition to office E&M  Ask about tobacco use:   Ongoing at 1 pk per day Advise quitting   I took an extended  opportunity with this patient to outline the consequences of continued cigarette use  in airway disorders based on all the data we have from the multiple national lung health studies (perfomed over decades at millions of dollars in cost)  indicating that smoking cessation, not choice of inhalers or physicians, is the most important aspect of care.   Assess willingness:  Not committed at this point - not even close despite acknowledging clear cost/benefit advantage. Assist in quit attempt:  Per PCP when ready Arrange follow up:   Follow up per Primary Care planned

## 2017-07-11 NOTE — Assessment & Plan Note (Signed)
HCO3 12/08/2015 = 34  HC03   03/18/17    = 27   Apparently resolved but still at risk

## 2017-07-11 NOTE — Patient Instructions (Addendum)
Plan A = Automatic = symbicort 160 x 2pff in am followed by Spiriva and then symbicort x 2 puff 12 hours later  Work on inhaler technique:  relax and gently blow all the way out then take a nice smooth deep breath back in, triggering the inhaler at same time you start breathing in.  Hold for up to 5 seconds if you can. Blow out thru nose. Rinse and gargle with water when done   Plan B = Backup Only use your albuterol as a rescue medication to be used if you can't catch your breath by resting or doing a relaxed purse lip breathing pattern.  - The less you use it, the better it will work when you need it. - Ok to use the inhaler up to 2 puffs  every 4 hours if you must but call for appointment if use goes up over your usual need - Don't leave home without it !!  (think of it like the spare tire for your car)   Plan C = Crisis - only use your albuterol nebulizer if you first try Plan B and it fails to help > ok to use the nebulizer up to every 4 hours but if start needing it regularly call for immediate appointment  The key is to stop smoking completely before smoking completely stops you!    Please remember to go to the  x-ray department downstairs in the basement  for your tests - we will call you with the results when they are available.       Please schedule a follow up visit in 3 months but call sooner if needed  with all medications /inhalers/ solutions in hand so we can verify exactly what you are taking. This includes all medications from all doctors and over the counters

## 2017-07-11 NOTE — Progress Notes (Signed)
Subjective:     Patient ID: Michael [name], male   DOB: 08-27-1960      MRN: [id number]    Brief patient profile:  67 yowm active smoker referred to pulmonary clinic 12/08/2015 by Dr  Ilsa Iha with GOLD III copd documented 08/19/13     History of Present Illness  08/19/13 ov / Michael [name] GOLD III copd  rec Continue symbicort 160 Take 2 puffs first thing in am and then another 2 puffs about 12 hours later.  Only use your  atrovent as a rescue medication Work on inhaler technique:     12/08/2015  Re-establish/ extended  Pulmonary office visit/ Michael [name]  maint rx qvar / atrovent prn Chief Complaint  Patient presents with  . Pulm Consult    Pt. has a history of COPD.   breathing gradually worse x sev months / assoc with coughing > mostly white  rx zpak/ prednisone x 12/04/15 no better  Doe = MMRC3 = can't walk 100 yards even at a slow pace at a flat grade s stopping due to sob   rec Plan A = Automatic = Symbicort 160 Take 2 puffs first thing in am and then another 2 puffs about 12 hours later.                                      Atrovent 2 puffs four times day  Plan B = Backup Only use your albuterol (Ventolin)as a rescue medication  Plan C = Crisis - only use your albuterol nebulizer if you first try Plan B and it fails to help > ok to use the nebulizer up to every 4 hours but if start needing it regularly call for immediate appointment Please remember to go to the lab and x-ray department downstairs for your tests - we will call you with the results when they are available.      01/08/2016  f/u ov/Michael [name] re: copd III/ hypercabic on symb 160 2bid/ atrovent 2qid Chief Complaint  Patient presents with  . Follow-up    Breathing some worse which he relates to cold weather. His cough and wheezing are unchanged. No new co's today. He is using ventolin at least once per day.   only uses ventolin if goes outside in cold/ or going up steps  Rare noct need for saba/no neb form needed at all  sleeps ok flat  most nights s am flares Doe still = MMRC3 rec No change in inhalers Please schedule a follow up visit in 3 months but call sooner if needed with cxr on return      04/11/2016  f/u ov/Michael [name] re: COPD III/ resumed smoking/ symb 160 / atrovent qid / saba  Chief Complaint  Patient presents with  . Follow-up    Seems to be coughing more recently. Cough is prod with clear to "somtimes kinda black" sputum.  He states "the inhalers don't last".  He started smoking again a few months ago 1/2- 1 ppd.   was doing better with cough and ex tol until resumed smoking/ cough worse in am's but clears w/in an hour of stirring  rec For cough, 1st stop smoking  / mucinex dm 1200 mg every 12 hours as needed  Work hard on stopping all smoking before smoking stops you  Work on Musician technique:  Continue symbicort 160 Take 2 puffs first thing in am and then another 2  puffs about 12 hours later and atovent is 2 pffs 2 x daily and additional doses as needed  Plan B = Backup Only use your albuterol (Proair= RED) instead of proventil as a rescue medication  Plan C = Crisis - only use your albuterol nebulizer if you first try Plan B and it fails to help > ok to use the nebulizer up to every 4 hours but if start needing it regularly call for immediate appointment    07/11/2016  f/u ov/Michael [name] re:  GOLD III still smoking maint on symb 160 2bid  Chief Complaint  Patient presents with  . Follow-up    Breathing has improved since the weather has been less humid the past few days. His cough is "worse at times"- prod with clear sputum.  He uses his proair inhaler at least 6 x per day on average.   doe = MMRC3 = can't walk 100 yards even at a slow pace at a flat grade s stopping due to sob   Not using atrovent regularly - has neb but it's "missing a piece" and doesn't know whom to call  rec No change in medications  Work harder on not smoking  Please remember to go to the  x-ray department downstairs in the  basement  for your tests - we will call you with the results when they are available.    Please schedule a follow up visit in 6  months but call sooner if needed  with all medications /inhalers/ solutions in hand so we can verify exactly what you are taking. This includes all medications from all doctors and over the counters    01/11/2017  f/u ov/Michael [name] re:  COPD GOLD III/ still smoking/ did not bring meds as rec  Chief Complaint  Patient presents with  . Follow-up    Pt states his breathing seems worse. He "has spells" where he can not catch his breath at all. He states sometimes he gets out of breath with just walking from one room to the next. He is coughing with clear sputum. He has been out of his inhalers and does not have all of the equipment needed to use neb.    worse off symbicort / extremely poor insight into meds  Doe = MMRC3 = can't walk 100 yards even at a slow pace at a flat grade s stopping due to sob rec  Plan A = Automatic = Symbicort 160 Take 2 puffs first thing in am and then another 2 puffs about 12 hours later and atrovent 2 pffs 4 x daily  Work on inhaler technique:   The key is to stop smoking completely before smoking completely stops you!  Plan B = Backup Only use your albuterol (ventolin = proair) as a rescue medication  Plan C = Crisis - only use your albuterol nebulizer if you first try Plan B       07/11/2017  f/u ov/Michael [name] re:  GOLD III copd / still smoking  Chief Complaint  Patient presents with  . Follow-up    Breathing is about the same. He has good days and bad. Still smoking 1 ppd.    Dyspnea:  MMRC3 = can't walk 100 yards even at a slow pace at a flat grade s stopping due to sob  / still able to do grocery shopping Cough: worse the more he smokes  Sleeping: variably prop up  SABA use: variable depending on activity  02: none needed    No obvious day  to day or daytime variability or assoc excess/ purulent sputum or mucus plugs or hemoptysis or cp or  chest tightness, subjective wheeze or overt sinus or hb symptoms.   Also denies any obvious fluctuation of symptoms with weather or environmental changes or other aggravating or alleviating factors except as outlined above   No unusual exposure hx or h/o childhood pna/ asthma or knowledge of premature birth.  Current Allergies, Complete Past Medical History, Past Surgical History, Family History, and Social History were reviewed in [facility] electronic medical record.  ROS  The following are not active complaints unless bolded Hoarseness, sore throat, dysphagia, dental problems, itching, sneezing,  nasal congestion or discharge of excess mucus or purulent secretions, ear ache,   fever, chills, sweats, unintended wt loss or wt gain, classically pleuritic or exertional cp,  orthopnea pnd or arm/hand swelling  or leg swelling, presyncope, palpitations, abdominal pain, anorexia, nausea, vomiting, diarrhea  or change in bowel habits or change in bladder habits, change in stools or change in urine, dysuria, hematuria,  rash, arthralgias, visual complaints, headache, numbness, weakness or ataxia or problems with walking or coordination,  change in mood or  memory.        Current Meds  Medication Sig  . Acetaminophen (TYLENOL) 325 MG CAPS Take by mouth as needed.  [name]. albuterol (PROVENTIL HFA;VENTOLIN HFA) 108 (90 Base) MCG/ACT inhaler Inhale 2 puffs into the lungs every 4 (four) hours as needed for wheezing or shortness of breath.  . budesonide-formoterol (SYMBICORT) 160-4.5 MCG/ACT inhaler Take 2 puffs first thing in am and then another 2 puffs about 12 hours later.  [name]. FLUoxetine (PROZAC) 40 MG capsule Take 1 capsule (40 mg total) by mouth daily.  . fluticasone (FLONASE) 50 MCG/ACT nasal spray Place 2 sprays into both nostrils as needed.  . lactose free nutrition (BOOST PLUS) LIQD Take 237 mLs by mouth 3 (three) times daily with meals.  . Multiple Vitamin (MULTIVITAMIN WITH MINERALS) TABS tablet  Take 1 tablet by mouth daily.  [name]. omeprazole (PRILOSEC) 20 MG capsule Take 20 mg by mouth 2 (two) times daily.   [name]. tiotropium (SPIRIVA HANDIHALER) 18 MCG inhalation capsule Place 1 capsule (18 mcg total) into inhaler and inhale daily.                   Objective:   Physical Exam  Somber amb wm nad    07/11/2017         125  01/11/2017         139  07/11/2016         144   04/11/2016        147          12/08/15 157 lb 3.2 oz (71.3 kg)  11/04/15 153 lb 12.8 oz (69.8 kg)  08/25/15 160 lb 12.8 oz (72.9 kg)    Vital signs reviewed - Note on arrival 02 sats  97% on RA         HEENT: nl oropharynx. Nl external ear canals without cough reflex - mild  bilateral non-specific turbinate edema /edentulous    NECK :  without JVD/Nodes/TM/ nl carotid upstrokes bilaterally   LUNGS: no acc muscle use,  Mod barrel  contour chest wall with bilateral  Distant bs s audible wheeze and  without cough on insp or exp maneuver and mod   Hyperresonant  to  percussion bilaterally     CV:  RRR  no s3 or murmur or increase in P2, and no  edema   ABD:  soft and nontender with pos mid insp Hoover's  in the supine position. No bruits or organomegaly appreciated, bowel sounds nl  MS:   Nl gait/  ext warm without deformities, calf tenderness, cyanosis or clubbing No obvious joint restrictions   SKIN: warm and dry without lesions    NEURO:  alert, approp, nl sensorium with  no motor or cerebellar deficits apparent.         CXR PA and Lateral:   07/11/2017 :    I personally reviewed images and agree with radiology impression as follows:     Chronic hyperinflation consistent with COPD. No acute cardiopulmonary abnormality.     Assessment:

## 2017-07-11 NOTE — Assessment & Plan Note (Signed)
- reports quit smokng 06/2013> resumed by ov 04/11/2016   - Spirometry 08/19/2013  FEV1 1.85 ( 47%) ratio 44  - Spirometry 12/08/2015   FEV1 1.53 (43%)  Ratio 40 off maint rx  - 12/08/2015  After extensive coaching HFA effectiveness =    75% > start symb 160/atrovent qid   - Allergy profile 12/08/15  >  Eos 0.0 /  IgE  347  RAST pos dust/ mold - Alpha One AT screen 12/08/15 > MM/ levels ok   - 07/11/2017  After extensive coaching inhaler device  effectiveness =    75% (short Ti)   DDX of  difficult airways management almost all start with A and  include Adherence, Ace Inhibitors, Acid Reflux, Active Sinus Disease, Alpha 1 Antitripsin deficiency, Anxiety masquerading as Airways dz,  ABPA,  Allergy(esp in young), Aspiration (esp in elderly), Adverse effects of meds,  Active smokers, A bunch of PE's (a small clot burden can't cause this syndrome unless there is already severe underlying pulm or vascular dz with poor reserve) plus two Bs  = Bronchiectasis and Beta blocker use..and one C= CHF  Adherence is always the initial "prime suspect" and is a multilayered concern that requires a "trust but verify" approach in every patient - starting with knowing how to use medications, especially inhalers, correctly, keeping up with refills and understanding the fundamental difference between maintenance and prns vs those medications only taken for a very short course and then stopped and not refilled.  - did not bring meds as req > should return with all meds in hand using a trust but verify approach to confirm accurate Medication  Reconciliation The principal here is that until we are certain that the  patients are doing what we've asked, it makes no sense to ask them to do more. - see hfa teaching  Active smoker > see sep a/p   ? Acid (or non-acid) GERD > always difficult to exclude as up to 75% of pts in some series report no assoc GI/ Heartburn symptoms> rec continue max (24h)  acid suppression and diet restrictions/  reviewed     ? Anxiety > usually at the bottom of this list of usual suspects but should be much higher on this pt's based on H and P and note already on psychotropics and may interfere with adherence and also interpretation of response or lack thereof to symptom management which can be quite subjective and also interfere with efforts to stop smoking > Follow up per Primary Care planned    Alpha one ruled out   ? Allergy/ asthma > doubt > continue symbicort 160 / no need for additional rx    I had an extended discussion with the patient reviewing all relevant studies completed to date and  lasting 15 to 20 minutes of a 25 minute visit    See device teaching which extended face to face time for this visit.  Each maintenance medication was reviewed in detail including emphasizing most importantly the difference between maintenance and prns and under what circumstances the prns are to be triggered using an action plan format that is not reflected in the computer generated alphabetically organized AVS which I have not found useful in most complex patients, especially with respiratory illnesses  Please see AVS for specific instructions unique to this visit that I personally wrote and verbalized to the the pt in detail and then reviewed with pt  by my nurse highlighting any  changes in therapy recommended at  today's visit to their plan of care.

## 2017-07-12 ENCOUNTER — Encounter: Payer: Self-pay | Admitting: Internal Medicine

## 2017-08-02 ENCOUNTER — Other Ambulatory Visit: Payer: Self-pay | Admitting: Cardiovascular Disease

## 2017-08-02 NOTE — Telephone Encounter (Signed)
Rx sent to pharmacy   

## 2017-08-31 ENCOUNTER — Telehealth: Payer: Self-pay

## 2017-08-31 ENCOUNTER — Encounter: Payer: Medicare HMO | Admitting: *Deleted

## 2017-08-31 NOTE — Telephone Encounter (Signed)
LMOVM reminding pt to send remote transmission.   

## 2017-09-01 ENCOUNTER — Encounter: Payer: Self-pay | Admitting: Cardiology

## 2017-09-01 NOTE — Progress Notes (Signed)
Letter  

## 2017-09-19 ENCOUNTER — Other Ambulatory Visit: Payer: Self-pay | Admitting: Internal Medicine

## 2017-10-11 ENCOUNTER — Encounter: Payer: Self-pay | Admitting: Internal Medicine

## 2017-10-11 ENCOUNTER — Ambulatory Visit (INDEPENDENT_AMBULATORY_CARE_PROVIDER_SITE_OTHER): Payer: Medicare HMO | Admitting: Internal Medicine

## 2017-10-11 VITALS — BP 112/68 | HR 72 | Ht 69.0 in | Wt 131.6 lb

## 2017-10-11 DIAGNOSIS — J449 Chronic obstructive pulmonary disease, unspecified: Secondary | ICD-10-CM

## 2017-10-11 DIAGNOSIS — F17210 Nicotine dependence, cigarettes, uncomplicated: Secondary | ICD-10-CM

## 2017-10-11 DIAGNOSIS — Z23 Encounter for immunization: Secondary | ICD-10-CM

## 2017-10-11 MED ORDER — ALBUTEROL SULFATE 1.25 MG/3ML IN NEBU: 1.0000 | INHALATION_SOLUTION | Freq: Four times a day (QID) | RESPIRATORY_TRACT | 1 refills | Status: DC | PRN

## 2017-10-11 MED ORDER — VENTOLIN HFA 108 (90 BASE) MCG/ACT IN AERS: 2.0000 | INHALATION_SPRAY | RESPIRATORY_TRACT | 1 refills | Status: DC | PRN

## 2017-10-11 MED ORDER — TIOTROPIUM BROMIDE MONOHYDRATE 2.5 MCG/ACT IN AERS: 2.0000 | INHALATION_SPRAY | Freq: Every day | RESPIRATORY_TRACT | 0 refills | Status: DC

## 2017-10-11 MED ORDER — BUDESONIDE-FORMOTEROL FUMARATE 160-4.5 MCG/ACT IN AERO: INHALATION_SPRAY | RESPIRATORY_TRACT | 11 refills | Status: DC

## 2017-10-11 MED ORDER — TIOTROPIUM BROMIDE MONOHYDRATE 2.5 MCG/ACT IN AERS: 2.0000 | INHALATION_SPRAY | Freq: Every day | RESPIRATORY_TRACT | 11 refills | Status: DC

## 2017-10-11 NOTE — Assessment & Plan Note (Addendum)
-   reports quit smokng 06/2013> resumed by ov 04/11/2016   - Spirometry 08/19/2013  FEV1 1.85 ( 47%) ratio 44  - Spirometry 12/08/2015   FEV1 1.53 (43%)  Ratio 40 off maint rx  - 12/08/2015  After extensive coaching HFA effectiveness =    75% > start symb 160/atrovent qid   - Allergy profile 12/08/15  >  Eos 0.0 /  IgE  347  RAST pos dust/ mold - Alpha One AT screen 12/08/15 > MM/ levels ok     - 10/11/2017  After extensive coaching inhaler device,  effectiveness =    90% with SMI > change to spiriva smi    Main concern at this point is cough so may do a lot better with smi than dpi spiriva   In terms of saba, reviewed hfa vs neb and  I spent extra time with pt today reviewing appropriate use of albuterol for prn use on exertion with the following points: 1) saba is for relief of sob that does not improve by walking a slower pace or resting but rather if the pt does not improve after trying this first. 2) If the pt is convinced, as many are, that saba helps recover from activity faster then it's easy to tell if this is the case by re-challenging : ie stop, take the inhaler, then p 5 minutes try the exact same activity (intensity of workload) that just caused the symptoms and see if they are substantially diminished or not after saba 3) if there is an activity that reproducibly causes the symptoms, try the saba 15 min before the activity on alternate days   If in fact the saba really does help, then fine to continue to use it prn but advised may need to look closer at the maintenance regimen being used to achieve better control of airways disease with exertion.    I had an extended discussion with the patient reviewing all relevant studies completed to date and  lasting 15 to 20 minutes of a 25 minute visit    See device teaching which extended face to face time for this visit.  Each maintenance medication was reviewed in detail including emphasizing most importantly the difference between maintenance  and prns and under what circumstances the prns are to be triggered using an action plan format that is not reflected in the computer generated alphabetically organized AVS which I have not found useful in most complex patients, especially with respiratory illnesses  Please see AVS for specific instructions unique to this visit that I personally wrote and verbalized to the the pt in detail and then reviewed with pt  by my nurse highlighting any  changes in therapy recommended at today's visit to their plan of care.

## 2017-10-11 NOTE — Progress Notes (Signed)
Subjective:     Patient ID: Michael [name], male   DOB: 06/27/1960      MRN: [id number]    Brief patient profile:  82 yowm active smoker referred to pulmonary clinic 12/08/2015 by Dr  Michael [name] with GOLD III copd documented 08/19/13     History of Present Illness  08/19/13 ov / Michael [name] GOLD III copd  rec Continue symbicort 160 Take 2 puffs first thing in am and then another 2 puffs about 12 hours later.  Only use your  atrovent as a rescue medication Work on inhaler technique:     12/08/2015  Re-establish/ extended  Pulmonary office visit/ Michael [name]  maint rx qvar / atrovent prn Chief Complaint  Patient presents with  . Pulm Consult    Pt. has a history of COPD.   breathing gradually worse x sev months / assoc with coughing > mostly white  rx zpak/ prednisone x 12/04/15 no better  Doe = MMRC3 = can't walk 100 yards even at a slow pace at a flat grade s stopping due to sob   rec Plan A = Automatic = Symbicort 160 Take 2 puffs first thing in am and then another 2 puffs about 12 hours later.                                      Atrovent 2 puffs four times day  Plan B = Backup Only use your albuterol (Ventolin)as a rescue medication  Plan C = Crisis - only use your albuterol nebulizer if you first try Plan B and it fails to help > ok to use the nebulizer up to every 4 hours but if start needing it regularly call for immediate appointment Please remember to go to the lab and x-ray department downstairs for your tests - we will call you with the results when they are available.      01/08/2016  f/u ov/Michael [name] re: copd III/ hypercabic on symb 160 2bid/ atrovent 2qid Chief Complaint  Patient presents with  . Follow-up    Breathing some worse which he relates to cold weather. His cough and wheezing are unchanged. No new co's today. He is using ventolin at least once per day.   only uses ventolin if goes outside in cold/ or going up steps  Rare noct need for saba/no neb form needed at all  sleeps ok flat  most nights s am flares Doe still = MMRC3 rec No change in inhalers Please schedule a follow up visit in 3 months but call sooner if needed with cxr on return      04/11/2016  f/u ov/Michael [name] re: COPD III/ resumed smoking/ symb 160 / atrovent qid / saba  Chief Complaint  Patient presents with  . Follow-up    Seems to be coughing more recently. Cough is prod with clear to "somtimes kinda black" sputum.  He states "the inhalers don't last".  He started smoking again a few months ago 1/2- 1 ppd.   was doing better with cough and ex tol until resumed smoking/ cough worse in am's but clears w/in an hour of stirring  rec For cough, 1st stop smoking  / mucinex dm 1200 mg every 12 hours as needed  Work hard on stopping all smoking before smoking stops you  Work on Musician technique:  Continue symbicort 160 Take 2 puffs first thing in am and then another 2  puffs about 12 hours later and atovent is 2 pffs 2 x daily and additional doses as needed  Plan B = Backup Only use your albuterol (Proair= RED) instead of proventil as a rescue medication  Plan C = Crisis - only use your albuterol nebulizer if you first try Plan B and it fails to help > ok to use the nebulizer up to every 4 hours but if start needing it regularly call for immediate appointment    07/11/2016  f/u ov/Michael [name] re:  GOLD III still smoking maint on symb 160 2bid  Chief Complaint  Patient presents with  . Follow-up    Breathing has improved since the weather has been less humid the past few days. His cough is "worse at times"- prod with clear sputum.  He uses his proair inhaler at least 6 x per day on average.   doe = MMRC3 = can't walk 100 yards even at a slow pace at a flat grade s stopping due to sob   Not using atrovent regularly - has neb but it's "missing a piece" and doesn't know whom to call  rec No change in medications  Work harder on not smoking  Please remember to go to the  x-ray department downstairs in the  basement  for your tests - we will call you with the results when they are available.    Please schedule a follow up visit in 6  months but call sooner if needed  with all medications /inhalers/ solutions in hand so we can verify exactly what you are taking. This includes all medications from all doctors and over the counters    01/11/2017  f/u ov/Michael [name] re:  COPD GOLD III/ still smoking/ did not bring meds as rec  Chief Complaint  Patient presents with  . Follow-up    Pt states his breathing seems worse. He "has spells" where he can not catch his breath at all. He states sometimes he gets out of breath with just walking from one room to the next. He is coughing with clear sputum. He has been out of his inhalers and does not have all of the equipment needed to use neb.    worse off symbicort / extremely poor insight into meds  Doe = MMRC3 = can't walk 100 yards even at a slow pace at a flat grade s stopping due to sob rec  Plan A = Automatic = Symbicort 160 Take 2 puffs first thing in am and then another 2 puffs about 12 hours later and atrovent 2 pffs 4 x daily  Work on inhaler technique:   The key is to stop smoking completely before smoking completely stops you!  Plan B = Backup Only use your albuterol (ventolin = proair) as a rescue medication  Plan C = Crisis - only use your albuterol nebulizer if you first try Plan B       07/11/2017  f/u ov/Michael [name] re:  GOLD III copd / still smoking  Chief Complaint  Patient presents with  . Follow-up    Breathing is about the same. He has good days and bad. Still smoking 1 ppd.    Dyspnea:  MMRC3 = can't walk 100 yards even at a slow pace at a flat grade s stopping due to sob  / still able to do grocery shopping Cough: worse the more he smokes  Sleeping: variably prop up  SABA use: variable depending on activity  02: none needed  rec Plan A = Automatic =  symbicort 160 x 2pff in am followed by Spiriva and then symbicort x 2 puff 12 hours later Work  on inhaler technique:  Plan B = Backup Only use your albuterol as a rescue medication  Plan C = Crisis - only use your albuterol nebulizer if you first try Plan B The key is to stop smoking completely before smoking completely stops you! Please schedule a follow up visit in 3 months but call sooner if needed  with all medications /inhalers/ solutions in hand so we can verify exactly what you are taking. This includes all medications from all doctors and over the counters    10/11/2017  f/u ov/Nour Scalise re: COPD III/ still smoking maint on symb/ spiriva dpi  Chief Complaint  Patient presents with  . Follow-up    Breathing "depends on the heat outside"- he is unsure of meds and states "needs refills on everything".    Dyspnea:  MMRC3 = can't walk 100 yards even at a slow pace at a flat grade s stopping due to sob   Cough: provoked  with active smoking and ? From dpi spiriva also/ min mucoid  Sleeping: bed flat with a couple of pillows  SABA use: way too much when weather hot  02: none    No obvious day to day or daytime variability or assoc excess/ purulent sputum or mucus plugs or hemoptysis or cp or chest tightness, subjective wheeze or overt sinus or hb symptoms.   Sleeping  without nocturnal  or early am exacerbation  of respiratory  c/o's or need for noct saba. Also denies any obvious fluctuation of symptoms with weather or environmental changes or other aggravating or alleviating factors except as outlined above   No unusual exposure hx or h/o childhood pna/ asthma or knowledge of premature birth.  Current Allergies, Complete Past Medical History, Past Surgical History, Family History, and Social History were reviewed in [employer] record.  ROS  The following are not active complaints unless bolded Hoarseness, sore throat, dysphagia, dental problems, itching, sneezing,  nasal congestion or discharge of excess mucus or purulent secretions, ear ache,   fever,  chills, sweats, unintended wt loss or wt gain, classically pleuritic or exertional cp,  orthopnea pnd or arm/hand swelling  or leg swelling, presyncope, palpitations, abdominal pain, anorexia, nausea, vomiting, diarrhea  or change in bowel habits or change in bladder habits, change in stools or change in urine, dysuria, hematuria,  rash, arthralgias, visual complaints, headache, numbness, weakness or ataxia or problems with walking or coordination,  change in mood or  memory.        Current Meds  Medication Sig  . Acetaminophen (TYLENOL) 325 MG CAPS Take by mouth as needed.  [name] albuterol (ACCUNEB) 1.25 MG/3ML nebulizer solution Take 3 mLs (1.25 mg total) by nebulization every 6 (six) hours as needed for wheezing.  . budesonide-formoterol (SYMBICORT) 160-4.5 MCG/ACT inhaler Take 2 puffs first thing in am and then another 2 puffs about 12 hours later.  [name] FLUoxetine (PROZAC) 40 MG capsule Take 1 capsule (40 mg total) by mouth daily.  . fluticasone (FLONASE) 50 MCG/ACT nasal spray Place 2 sprays into both nostrils as needed.  . isosorbide mononitrate (IMDUR) 30 MG 24 hr tablet Take 1 tablet (30 mg total) by mouth daily. Patient needs appointment for further refills.  . lactose free nutrition (BOOST PLUS) LIQD Take 237 mLs by mouth 3 (three) times daily with meals.  . Multiple Vitamin (MULTIVITAMIN WITH MINERALS) TABS tablet Take 1  tablet by mouth daily.  [name] omeprazole (PRILOSEC) 20 MG capsule Take 20 mg by mouth 2 (two) times daily.   . VENTOLIN HFA 108 (90 Base) MCG/ACT inhaler Inhale 2 puffs into the lungs every 4 (four) hours as needed for wheezing or shortness of breath.  .     .    .  ] tiotropium (SPIRIVA HANDIHALER) 18 MCG inhalation capsule Place 1 capsule (18 mcg total) into inhaler and inhale daily.  .                        Objective:   Physical Exam  amb disheveled wm nad     10/11/2017       131  07/11/2017         125  01/11/2017         139  07/11/2016         144   04/11/2016         147          12/08/15 157 lb 3.2 oz (71.3 kg)  11/04/15 153 lb 12.8 oz (69.8 kg)  08/25/15 160 lb 12.8 oz (72.9 kg)    Vital signs reviewed - Note on arrival 02 sats  96% on RA         HEENT: nl oropharynx. Nl external ear canals without cough reflex - mild  bilateral non-specific turbinate edema /edentulous  5c mod    HEENT: edentuolous  oropharynx. Nl external ear canals without cough reflex -  Mild bilateral non-specific turbinate edema     NECK :  without JVD/Nodes/TM/ nl carotid upstrokes bilaterally   LUNGS: no acc muscle use,  Mod barrel  contour chest wall with bilateral  Distant bs s audible wheeze and  without cough on insp or exp maneuver and mod  Hyperresonant  to  percussion bilaterally     CV:  RRR  no s3 or murmur or increase in P2, and no edema   ABD:  soft and nontender with pos mid  insp Hoover's  in the supine position. No bruits or organomegaly appreciated, bowel sounds nl  MS:   Nl gait/  ext warm without deformities, calf tenderness, cyanosis or clubbing No obvious joint restrictions   SKIN: warm and dry with ? AK R forearm > deferred to PCP     NEURO:  alert, approp, nl sensorium with  no motor or cerebellar deficits apparent.           Assessment:

## 2017-10-11 NOTE — Patient Instructions (Addendum)
Change spiriva to Respimat form  and use 2 pffs first thing in am only to follow the symbicort x 2 puffs only in the am, then take the other 2 pffs of symbicort 12 hours later   Work on inhaler technique:  relax and gently blow all the way out then take a nice smooth deep breath back in, triggering the inhaler at same time you start breathing in.  Hold for up to 5 seconds if you can.   Rinse and gargle with water when done   The key is to stop smoking completely before smoking completely stops you!    Please schedule a follow up visit in 3 months but call sooner if needed  with all medications /inhalers/ solutions in hand so we can verify exactly what you are taking. This includes all medications from all doctors and over the counters

## 2017-10-11 NOTE — Assessment & Plan Note (Signed)

## 2017-10-26 ENCOUNTER — Encounter: Payer: Self-pay | Admitting: Cardiology

## 2017-12-14 ENCOUNTER — Other Ambulatory Visit: Payer: Self-pay | Admitting: Cardiovascular Disease

## 2017-12-15 NOTE — Telephone Encounter (Signed)
Rx request sent to pharmacy.  

## 2018-01-12 ENCOUNTER — Ambulatory Visit: Payer: Medicare HMO | Admitting: Internal Medicine

## 2018-01-12 ENCOUNTER — Encounter: Payer: Self-pay | Admitting: Internal Medicine

## 2018-01-12 VITALS — BP 90/60 | HR 80 | Ht 69.0 in | Wt 137.0 lb

## 2018-01-12 DIAGNOSIS — J449 Chronic obstructive pulmonary disease, unspecified: Secondary | ICD-10-CM | POA: Diagnosis not present

## 2018-01-12 DIAGNOSIS — F17210 Nicotine dependence, cigarettes, uncomplicated: Secondary | ICD-10-CM

## 2018-01-12 NOTE — Patient Instructions (Signed)
Only use your albuterol as a rescue medication to be used if you can't catch your breath by resting or doing a relaxed purse lip breathing pattern.  - The less you use it, the better it will work when you need it. - Ok to use up to 2 puffs  every 4 hours if you must but call for immediate appointment if use goes up over your usual need - Don't leave home without it !!  (think of it like the spare tire for your car)    The key is to stop smoking completely before smoking completely stops you!  For smoking cessation classes call [phone]      Please schedule a follow up visit in 3 months but call sooner if needed  with all medications /inhalers/ solutions in hand so we can verify exactly what you are taking. This includes all medications from all doctors and over the counters

## 2018-01-12 NOTE — Progress Notes (Signed)
Subjective:     Patient ID: Michael [name], male   DOB: [birth year]      MRN: [id number]    Brief patient profile:  58 yowm MM active smoker referred to pulmonary clinic 12/08/2015 by Dr  Graylon Good with GOLD III copd documented 08/19/13     History of Present Illness  57/17/15 ov / Michael [name] GOLD III copd  rec Continue symbicort 160 Take 2 puffs first thing in am and then another 2 puffs about 12 hours later.  Only use your  atrovent as a rescue medication Work on inhaler technique:     57/05/2015  Re-establish/ extended  Pulmonary office visit/ Michael [name]  maint rx qvar / atrovent prn Chief Complaint  Patient presents with  . Pulm Consult    Pt. has a history of COPD.   breathing gradually worse x sev months / assoc with coughing > mostly white  rx zpak/ prednisone x 12/04/15 no better  Doe = MMRC3 = can't walk 100 yards even at a slow pace at a flat grade s stopping due to sob   rec Plan A = Automatic = Symbicort 160 Take 2 puffs first thing in am and then another 2 puffs about 12 hours later.                                      Atrovent 2 puffs four times day  Plan B = Backup Only use your albuterol (Ventolin)as a rescue medication  Plan C = Crisis - only use your albuterol nebulizer if you first try Plan B  .     07/11/2017  f/u ov/Michael [name] re:  GOLD III copd / still smoking  Chief Complaint  Patient presents with  . Follow-up    Breathing is about the same. He has good days and bad. Still smoking 1 ppd.    Dyspnea:  MMRC3 = can't walk 100 yards even at a slow pace at a flat grade s stopping due to sob  / still able to do grocery shopping Cough: worse the more he smokes  Sleeping: variably prop up  SABA use: variable depending on activity  02: none needed  rec Plan A = Automatic = symbicort 160 x 2pff in am followed by Spiriva and then symbicort x 2 puff 12 hours later Work on inhaler technique:  Plan B = Backup Only use your albuterol as a rescue medication  Plan C = Crisis - only use your  albuterol nebulizer if you first try Plan B The key is to stop smoking completely before smoking completely stops you! Please schedule a follow up visit in 3 months but call sooner if needed  with all medications /inhalers/ solutions in hand so we can verify exactly what you are taking. This includes all medications from all doctors and over the counters    10/11/2017  f/u ov/Michael [name] re: COPD III/ still smoking maint on symb/ spiriva dpi  Chief Complaint  Patient presents with  . Follow-up    Breathing "depends on the heat outside"- he is unsure of meds and states "needs refills on everything".    Dyspnea:  MMRC3 = can't walk 100 yards even at a slow pace at a flat grade s stopping due to sob   Cough: provoked  with active smoking and ? From dpi spiriva also/ min mucoid  Sleeping: bed flat with a couple of pillows  SABA use: way  too much when weather hot  02: none  rec Change spiriva to Respimat form  and use 2 pffs first thing in am only to follow the symbicort x 2 puffs only in the am, then take the other 2 pffs of symbicort 12 hours later  Work on inhaler technique:  The key is to stop smoking completely before smoking completely stops you!  Please schedule a follow up visit in 3 months but call sooner if needed  with all medications     01/12/2018  f/u ov/Michael [name] re:  COPD  GOLD III / still smoking  Chief Complaint  Patient presents with  . Follow-up    Breathing is unchanged. He is using his albuterol inhaler approx 10 x per day. He rarely uses his neb.   Dyspnea:  MMRC3 = can't walk 100 yards even at a slow pace at a flat grade s stopping due to sob   Cough: smoking rattle  Sleeping: bed flat / 2 pillows  SABA use: uses saba hfa when over does it / rarely neb  02: none    No obvious day to day or daytime variability or assoc excess/ purulent sputum or mucus plugs or hemoptysis or cp or chest tightness, subjective wheeze or overt sinus or hb symptoms.   Sleeping as above  without  nocturnal  or early am exacerbation  of respiratory  c/o's or need for noct saba. Also denies any obvious fluctuation of symptoms with weather or environmental changes or other aggravating or alleviating factors except as outlined above   No unusual exposure hx or h/o childhood pna/ asthma or knowledge of premature birth.  Current Allergies, Complete Past Medical History, Past Surgical History, Family History, and Social History were reviewed in [facility] electronic [facility] record.  ROS  The following are not active complaints unless bolded Hoarseness, sore throat, dysphagia, dental problems, itching, sneezing,  nasal congestion or discharge of excess mucus or purulent secretions, ear ache,   fever, chills, sweats, unintended wt loss or wt gain, classically pleuritic or exertional cp,  orthopnea pnd or arm/hand swelling  or leg swelling, presyncope, palpitations, abdominal pain, anorexia, nausea, vomiting, diarrhea  or change in bowel habits or change in bladder habits, change in stools or change in urine, dysuria, hematuria,  rash, arthralgias, visual complaints, headache, numbness, weakness or ataxia or problems with walking or coordination,  change in mood or  memory.        Current Meds  Medication Sig  . Acetaminophen (TYLENOL) 325 MG CAPS Take by mouth as needed.  [name]. albuterol (ACCUNEB) 1.25 MG/3ML nebulizer solution Take 3 mLs (1.25 mg total) by nebulization every 6 (six) hours as needed for wheezing.  [name]. aspirin EC 81 MG tablet Take 81 mg by mouth daily.  . budesonide-formoterol (SYMBICORT) 160-4.5 MCG/ACT inhaler Take 2 puffs first thing in am and then another 2 puffs about 12 hours later.  [name]. FLUoxetine (PROZAC) 40 MG capsule Take 1 capsule (40 mg total) by mouth daily.  . fluticasone (FLONASE) 50 MCG/ACT nasal spray Place 2 sprays into both nostrils as needed.  . isosorbide mononitrate (IMDUR) 30 MG 24 hr tablet TAKE 1 TABLET BY MOUTH ONCE DAILY. PLEASE MAKE AN APPT FOR FUTURE REFILLS   . lactose free nutrition (BOOST PLUS) LIQD Take 237 mLs by mouth 3 (three) times daily with meals.  . Multiple Vitamin (MULTIVITAMIN WITH MINERALS) TABS tablet Take 1 tablet by mouth daily.  [name]. omeprazole (PRILOSEC) 20 MG capsule Take 20 mg by mouth  2 (two) times daily.   . Tiotropium Bromide Monohydrate (SPIRIVA RESPIMAT) 2.5 MCG/ACT AERS Inhale 2 puffs into the lungs daily.  . VENTOLIN HFA 108 (90 Base) MCG/ACT inhaler Inhale 2 puffs into the lungs every 4 (four) hours as needed for wheezing or shortness of breath.              Objective:   Physical Exam  amb wm nad > stated age   01/12/2018       137  10/11/2017       131  07/11/2017         125  01/11/2017         139  07/11/2016         144   04/11/2016        147          12/08/15 157 lb 3.2 oz (71.3 kg)  11/04/15 153 lb 12.8 oz (69.8 kg)  08/25/15 160 lb 12.8 oz (72.9 kg)    Vital signs reviewed - Note on arrival 02 sats  96% on RA      HEENT: Edentulous / nl oropharynx. Nl external ear canals without cough reflex -  Mild bilateral non-specific turbinate edema     NECK :  without JVD/Nodes/TM/ nl carotid upstrokes bilaterally   LUNGS: no acc muscle use,  Mod barrel  contour chest wall with bilateral  Distant bs s audible wheeze and  without cough on insp or exp maneuver and mod  Hyperresonant  to  percussion bilaterally     CV:  RRR  no s3 or murmur or increase in P2, and no edema   ABD:  soft and nontender with pos mid insp Hoover's  in the supine position. No bruits or organomegaly appreciated, bowel sounds nl  MS:   Nl gait/  ext warm without deformities, calf tenderness, cyanosis or clubbing No obvious joint restrictions   SKIN: warm and dry without lesions    NEURO:  alert, approp, nl sensorium with  no motor or cerebellar deficits apparent.            Assessment:

## 2018-01-13 ENCOUNTER — Encounter: Payer: Self-pay | Admitting: Internal Medicine

## 2018-01-13 NOTE — Assessment & Plan Note (Signed)

## 2018-01-13 NOTE — Assessment & Plan Note (Signed)
-   reports quit smokng 06/2013> resumed by ov 04/11/2016   - Spirometry 08/19/2013  FEV1 1.85 ( 47%) ratio 44  - Spirometry 12/08/2015   FEV1 1.53 (43%)  Ratio 40 off maint rx  - 12/08/2015  After extensive coaching HFA effectiveness =    75% > start symb 160/atrovent qid   - Allergy profile 12/08/15  >  Eos 0.0 /  IgE  347  RAST pos dust/ mold - [facility] AT screen 12/08/15 > MM/ levels ok   - 10/11/2017    changed to spiriva smi    - 01/12/2018  After extensive coaching inhaler device,  effectiveness =  90%       Group D in terms of symptom/risk and laba/lama/ICS  therefore appropriate rx at this point so should continue symb/spiriva for now and reduce saba dep if possible  I spent extra time with pt today reviewing appropriate use of albuterol for prn use on exertion with the following points: 1) saba is for relief of sob that does not improve by walking a slower pace or resting but rather if the pt does not improve after trying this first. 2) If the pt is convinced, as many are, that saba helps recover from activity faster then it's easy to tell if this is the case by re-challenging : ie stop, take the inhaler, then p 5 minutes try the exact same activity (intensity of workload) that just caused the symptoms and see if they are substantially diminished or not after saba 3) if there is an activity that reproducibly causes the symptoms, try the saba 15 min before the activity on alternate days   If in fact the saba really does help, then fine to continue to use it prn but advised may need to look closer at the maintenance regimen being used to achieve better control of airways disease with exertion.

## 2018-01-15 ENCOUNTER — Ambulatory Visit (INDEPENDENT_AMBULATORY_CARE_PROVIDER_SITE_OTHER): Payer: Medicare HMO

## 2018-01-15 DIAGNOSIS — I495 Sick sinus syndrome: Secondary | ICD-10-CM | POA: Diagnosis not present

## 2018-01-16 NOTE — Progress Notes (Signed)
Remote pacemaker transmission.   

## 2018-01-18 LAB — CUP PACEART REMOTE DEVICE CHECK
Battery Voltage: 2.79 V
Brady Statistic AP VP Percent: 0 %
Brady Statistic AP VS Percent: 40 %
Brady Statistic AS VS Percent: 60 %
Date Time Interrogation Session: 20200110221544
Implantable Lead Implant Date: 20050421
Implantable Lead Location: 753859
Implantable Lead Location: 753860
Implantable Lead Model: 5076
Implantable Pulse Generator Implant Date: 20140904
Lead Channel Pacing Threshold Amplitude: 0.625 V
Lead Channel Pacing Threshold Pulse Width: 0.4 ms
Lead Channel Pacing Threshold Pulse Width: 0.4 ms
Lead Channel Setting Pacing Amplitude: 2 V
Lead Channel Setting Pacing Pulse Width: 0.4 ms
Lead Channel Setting Sensing Sensitivity: 5.6 mV
MDC IDC LEAD IMPLANT DT: 20050421
MDC IDC MSMT BATTERY IMPEDANCE: 256 Ohm
MDC IDC MSMT BATTERY REMAINING LONGEVITY: 132 mo
MDC IDC MSMT LEADCHNL RA IMPEDANCE VALUE: 479 Ohm
MDC IDC MSMT LEADCHNL RV IMPEDANCE VALUE: 682 Ohm
MDC IDC MSMT LEADCHNL RV PACING THRESHOLD AMPLITUDE: 1 V
MDC IDC SET LEADCHNL RA PACING AMPLITUDE: 1.5 V
MDC IDC STAT BRADY AS VP PERCENT: 0 %

## 2018-04-06 ENCOUNTER — Encounter: Payer: Self-pay | Admitting: Internal Medicine

## 2018-04-16 ENCOUNTER — Ambulatory Visit: Payer: Self-pay | Admitting: Internal Medicine

## 2018-04-16 ENCOUNTER — Other Ambulatory Visit: Payer: Self-pay

## 2018-04-16 ENCOUNTER — Encounter: Payer: Medicare HMO | Admitting: *Deleted

## 2018-04-17 ENCOUNTER — Telehealth: Payer: Self-pay

## 2018-04-17 NOTE — Telephone Encounter (Signed)
Left message for patient to remind of missed remote transmission.  

## 2018-04-27 ENCOUNTER — Encounter: Payer: Self-pay | Admitting: Cardiology

## 2018-06-05 ENCOUNTER — Ambulatory Visit (INDEPENDENT_AMBULATORY_CARE_PROVIDER_SITE_OTHER): Payer: Medicare HMO | Admitting: *Deleted

## 2018-06-05 DIAGNOSIS — I495 Sick sinus syndrome: Secondary | ICD-10-CM

## 2018-06-05 LAB — CUP PACEART REMOTE DEVICE CHECK
Battery Impedance: 329 Ohm
Battery Remaining Longevity: 122 mo
Battery Voltage: 2.78 V
Brady Statistic AP VP Percent: 0 %
Brady Statistic AP VS Percent: 39 %
Brady Statistic AS VP Percent: 0 %
Brady Statistic AS VS Percent: 60 %
Date Time Interrogation Session: 20200602000840
Implantable Lead Implant Date: 20050421
Implantable Lead Implant Date: 20050421
Implantable Lead Location: 753859
Implantable Lead Location: 753860
Implantable Lead Model: 5076
Implantable Lead Model: 5076
Implantable Pulse Generator Implant Date: 20140904
Lead Channel Impedance Value: 464 Ohm
Lead Channel Impedance Value: 611 Ohm
Lead Channel Pacing Threshold Amplitude: 0.75 V
Lead Channel Pacing Threshold Amplitude: 1 V
Lead Channel Pacing Threshold Pulse Width: 0.4 ms
Lead Channel Pacing Threshold Pulse Width: 0.4 ms
Lead Channel Setting Pacing Amplitude: 1.5 V
Lead Channel Setting Pacing Amplitude: 2 V
Lead Channel Setting Pacing Pulse Width: 0.4 ms
Lead Channel Setting Sensing Sensitivity: 5.6 mV

## 2018-06-13 ENCOUNTER — Encounter: Payer: Self-pay | Admitting: Cardiology

## 2018-06-13 NOTE — Progress Notes (Signed)
Remote pacemaker transmission.   

## 2018-07-22 ENCOUNTER — Other Ambulatory Visit: Payer: Self-pay | Admitting: Internal Medicine

## 2018-08-22 ENCOUNTER — Other Ambulatory Visit: Payer: Self-pay | Admitting: Internal Medicine

## 2018-09-04 ENCOUNTER — Encounter: Payer: Medicare HMO | Admitting: *Deleted

## 2018-10-30 ENCOUNTER — Other Ambulatory Visit: Payer: Self-pay | Admitting: Internal Medicine

## 2018-11-02 ENCOUNTER — Other Ambulatory Visit: Payer: Self-pay | Admitting: Internal Medicine

## 2018-12-20 ENCOUNTER — Ambulatory Visit: Payer: Medicare HMO | Attending: Internal Medicine

## 2018-12-20 DIAGNOSIS — Z20822 Contact with and (suspected) exposure to covid-19: Secondary | ICD-10-CM

## 2018-12-21 LAB — NOVEL CORONAVIRUS, NAA: SARS-CoV-2, NAA: NOT DETECTED

## 2018-12-24 ENCOUNTER — Other Ambulatory Visit: Payer: Self-pay | Admitting: Internal Medicine

## 2018-12-26 ENCOUNTER — Other Ambulatory Visit: Payer: Self-pay

## 2018-12-26 MED ORDER — SPIRIVA RESPIMAT 2.5 MCG/ACT IN AERS: 2.0000 | INHALATION_SPRAY | Freq: Every day | RESPIRATORY_TRACT | 3 refills | Status: DC

## 2019-01-09 ENCOUNTER — Telehealth: Payer: Self-pay

## 2019-01-09 NOTE — Telephone Encounter (Signed)
Left message for patient to remind of missed remote transmission.  

## 2019-02-07 ENCOUNTER — Other Ambulatory Visit: Payer: Self-pay | Admitting: Internal Medicine

## 2019-02-12 ENCOUNTER — Telehealth: Payer: Self-pay | Admitting: Internal Medicine

## 2019-02-12 NOTE — Telephone Encounter (Signed)
Received faxed refill request from [facility] [address]  Medication name/strength/dose: Symbicort 160 Medication last rx'd: 12/24/18 with note that patient needed office visit for refills - no ov has been scheduled.  Patient is not filling his medication regularly Quantity and number of refills last rx'd: #1 with 0 refills  Patient last seen in the office on 1.10.2020  Does refill need to be authorized by a provider? No  Refill authorized (yes or no)?: Yes - patient MUST schedule appt in order to continue receiving refills.  Would like to speak with patient prior to refilling medication.   ATC patient at home/primary contact number - ring back tone rang for 3 minutes with no answer and no option to LM.  LMOM TCB x1 on cell phone

## 2019-02-13 MED ORDER — SYMBICORT 160-4.5 MCG/ACT IN AERO: 2.0000 | INHALATION_SPRAY | Freq: Two times a day (BID) | RESPIRATORY_TRACT | 0 refills | Status: DC

## 2019-02-13 NOTE — Telephone Encounter (Signed)
ATC patient at home number - ring back tone for 47secs Called cell phone and spoke with patient who asked that I call and speak with his mother [name] to schedule appt  [phone] - called spoke with patient's mother [name] -- appt scheduled with Dr Sherene Sires for 2.16.21 @ 12N.  [name] brings patient to his appts and will ensure this is not missed.  Advised [name] will send in #1 inhaler, pt will need to keep appt to keep receiving refills.  [name] voiced her understanding.  Rx sent, nothing further needed.  Will sign off.

## 2019-02-19 ENCOUNTER — Ambulatory Visit (INDEPENDENT_AMBULATORY_CARE_PROVIDER_SITE_OTHER): Payer: Medicare HMO | Admitting: Internal Medicine

## 2019-02-19 ENCOUNTER — Other Ambulatory Visit: Payer: Self-pay

## 2019-02-19 ENCOUNTER — Encounter: Payer: Self-pay | Admitting: Internal Medicine

## 2019-02-19 VITALS — BP 120/78 | HR 67 | Temp 98.6°F | Ht 69.0 in | Wt 137.0 lb

## 2019-02-19 DIAGNOSIS — J449 Chronic obstructive pulmonary disease, unspecified: Secondary | ICD-10-CM | POA: Diagnosis not present

## 2019-02-19 DIAGNOSIS — F17210 Nicotine dependence, cigarettes, uncomplicated: Secondary | ICD-10-CM

## 2019-02-19 MED ORDER — SPIRIVA RESPIMAT 2.5 MCG/ACT IN AERS: 2.0000 | INHALATION_SPRAY | Freq: Every day | RESPIRATORY_TRACT | 0 refills | Status: DC

## 2019-02-19 MED ORDER — VENTOLIN HFA 108 (90 BASE) MCG/ACT IN AERS: INHALATION_SPRAY | RESPIRATORY_TRACT | 11 refills | Status: DC

## 2019-02-19 MED ORDER — SPIRIVA RESPIMAT 2.5 MCG/ACT IN AERS: 2.0000 | INHALATION_SPRAY | Freq: Every day | RESPIRATORY_TRACT | 11 refills | Status: DC

## 2019-02-19 MED ORDER — SYMBICORT 160-4.5 MCG/ACT IN AERO: 2.0000 | INHALATION_SPRAY | Freq: Two times a day (BID) | RESPIRATORY_TRACT | 11 refills | Status: DC

## 2019-02-19 NOTE — Patient Instructions (Signed)
Work on inhaler technique:  relax and gently blow all the way out then take a nice smooth deep breath back in, triggering the inhaler at same time you start breathing in.  Hold for up to 5 seconds if you can. Blow out thru nose. Rinse and gargle with water when done     The key is to stop smoking completely before smoking completely stops you!    Please schedule a follow up visit in 6  months but call sooner if needed  with all medications /inhalers/ solutions in hand so we can verify exactly what you are taking. This includes all medications from all doctors and over the counters

## 2019-02-19 NOTE — Progress Notes (Signed)
Subjective:     Patient ID: Michael [name], male   DOB: Dec 30, 1960      MRN: [id number]    Brief patient profile:  13 yowm MM active smoker referred to pulmonary clinic 12/08/2015 by Dr  Graylon Good with GOLD III copd documented 08/19/13     History of Present Illness  08/19/13 ov / Michael [name] GOLD III copd  rec Continue symbicort 160 Take 2 puffs first thing in am and then another 2 puffs about 12 hours later.  Only use your  atrovent as a rescue medication Work on inhaler technique:     12/08/2015  Re-establish/ extended  Pulmonary office visit/ Michael [name]  maint rx qvar / atrovent prn Chief Complaint  Patient presents with  . Pulm Consult    Pt. has a history of COPD.   breathing gradually worse x sev months / assoc with coughing > mostly white  rx zpak/ prednisone x 12/04/15 no better  Doe = MMRC3 = can't walk 100 yards even at a slow pace at a flat grade s stopping due to sob   rec Plan A = Automatic = Symbicort 160 Take 2 puffs first thing in am and then another 2 puffs about 12 hours later.                                      Atrovent 2 puffs four times day  Plan B = Backup Only use your albuterol (Ventolin)as a rescue medication  Plan C = Crisis - only use your albuterol nebulizer if you first try Plan B  .     07/11/2017  f/u ov/Michael [name] re:  GOLD III copd / still smoking  Chief Complaint  Patient presents with  . Follow-up    Breathing is about the same. He has good days and bad. Still smoking 1 ppd.    Dyspnea:  MMRC3 = can't walk 100 yards even at a slow pace at a flat grade s stopping due to sob  / still able to do grocery shopping Cough: worse the more he smokes  Sleeping: variably prop up  SABA use: variable depending on activity  02: none needed  rec Plan A = Automatic = symbicort 160 x 2pff in am followed by Spiriva and then symbicort x 2 puff 12 hours later Work on inhaler technique:  Plan B = Backup Only use your albuterol as a rescue medication  Plan C = Crisis - only use your  albuterol nebulizer if you first try Plan B The key is to stop smoking completely before smoking completely stops you! Please schedule a follow up visit in 3 months but call sooner if needed  with all medications /inhalers/ solutions in hand so we can verify exactly what you are taking. This includes all medications from all doctors and over the counters    10/11/2017  f/u ov/Michael [name] re: COPD III/ still smoking maint on symb/ spiriva dpi  Chief Complaint  Patient presents with  . Follow-up    Breathing "depends on the heat outside"- he is unsure of meds and states "needs refills on everything".    Dyspnea:  MMRC3 = can't walk 100 yards even at a slow pace at a flat grade s stopping due to sob   Cough: provoked  with active smoking and ? From dpi spiriva also/ min mucoid  Sleeping: bed flat with a couple of pillows  SABA use: way  too much when weather hot  02: none  rec Change spiriva to Respimat form  and use 2 pffs first thing in am only to follow the symbicort x 2 puffs only in the am, then take the other 2 pffs of symbicort 12 hours later  Work on inhaler technique:  The key is to stop smoking completely before smoking completely stops you!  Please schedule a follow up visit in 3 months but call sooner if needed  with all medications     01/12/2018  f/u ov/Michael [name] re:  COPD  GOLD III / still smoking  Chief Complaint  Patient presents with  . Follow-up    Breathing is unchanged. He is using his albuterol inhaler approx 10 x per day. He rarely uses his neb.   Dyspnea:  MMRC3 = can't walk 100 yards even at a slow pace at a flat grade s stopping due to sob   Cough: smoking rattle  Sleeping: bed flat / 2 pillows  SABA use: [place] rec Only use your albuterol as a rescue medication to be used if you can't catch your breath The key is to stop smoking completely before smoking completely stops you! For smoking cessation classes call [phone]    Please schedule a follow up visit in 3 months but  call sooner if needed  with all medications /inhalers/ solutions in hand so we can verify exactly what you are taking. This includes all medications from all doctors and over the counters   02/19/2019  f/u ov/Michael [name] re: GOLD III still smoking extremely poor insight re meds  Chief Complaint  Patient presents with  . Follow-up    22yr f/u for COPD. States his nebulizer has stopped working. Has not new supplies.    Dyspnea:  MMRC3 = can't walk 100 yards even at a slow pace at a flat grade s stopping due to sob  Can't do shopping at [employer] Cough: clear mucus  Sleeping: nightly uses saba but poor baseline hfa  SABA use: way too much since neb stopped working  02: none    No obvious day to day or daytime variability or assoc excess/ purulent sputum or mucus plugs or hemoptysis or cp or chest tightness, subjective wheeze or overt sinus or hb symptoms.     Also denies any obvious fluctuation of symptoms with weather or environmental changes or other aggravating or alleviating factors except as outlined above   No unusual exposure hx or h/o childhood pna/ asthma or knowledge of premature birth.  Current Allergies, Complete Past Medical History, Past Surgical History, Family History, and Social History were reviewed in [employer] record.  ROS  The following are not active complaints unless bolded Hoarseness, sore throat, dysphagia, dental problems, itching, sneezing,  nasal congestion or discharge of excess mucus or purulent secretions, ear ache,   fever, chills, sweats, unintended wt loss or wt gain, classically pleuritic or exertional cp,  orthopnea pnd or arm/hand swelling  or leg swelling, presyncope, palpitations, abdominal pain, anorexia, nausea, vomiting, diarrhea  or change in bowel habits or change in bladder habits, change in stools or change in urine, dysuria, hematuria,  rash, arthralgias, visual complaints, headache, numbness, weakness or ataxia or problems with walking  or coordination,  change in mood or  memory.        Current Meds  Medication Sig  . Acetaminophen (TYLENOL) 325 MG CAPS Take by mouth as needed.  [name] albuterol (ACCUNEB) 1.25 MG/3ML nebulizer solution USE 1 VIAL IN NEBULIZER  EVERY 6 HOURS AS NEEDED FOR WHEEZING  . aspirin EC 81 MG tablet Take 81 mg by mouth daily.  [name] FLUoxetine (PROZAC) 40 MG capsule Take 1 capsule (40 mg total) by mouth daily.  . fluticasone (FLONASE) 50 MCG/ACT nasal spray Place 2 sprays into both nostrils as needed.  . isosorbide mononitrate (IMDUR) 30 MG 24 hr tablet TAKE 1 TABLET BY MOUTH ONCE DAILY. PLEASE MAKE AN APPT FOR FUTURE REFILLS  . lactose free nutrition (BOOST PLUS) LIQD Take 237 mLs by mouth 3 (three) times daily with meals.  . Multiple Vitamin (MULTIVITAMIN WITH MINERALS) TABS tablet Take 1 tablet by mouth daily.  [name] omeprazole (PRILOSEC) 20 MG capsule Take 20 mg by mouth 2 (two) times daily.   . SYMBICORT 160-4.5 MCG/ACT inhaler Inhale 2 puffs into the lungs 2 (two) times daily.  . Tiotropium Bromide Monohydrate (SPIRIVA RESPIMAT) 2.5 MCG/ACT AERS Inhale 2 puffs into the lungs daily.  . VENTOLIN HFA 108 (90 Base) MCG/ACT inhaler INHALE 2 PUFFS BY MOUTH EVERY 4 HOURS AS NEEDED FOR WHEEZING OR SHORTNESS OF BREATH                     Objective:   Physical Exam  amb wm disheveled and >>> age in appearance   02/19/2019         137  01/12/2018       137  10/11/2017       131  07/11/2017         125  01/11/2017         139  07/11/2016         144   04/11/2016        147          12/08/15 157 lb 3.2 oz (71.3 kg)  11/04/15 153 lb 12.8 oz (69.8 kg)  08/25/15 160 lb 12.8 oz (72.9 kg)         HEENT: Edentulous  By record HEENT : pt wearing mask not removed for exam due to covid -19 concerns.    NECK :  without JVD/Nodes/TM/ nl carotid upstrokes bilaterally   LUNGS: no acc muscle use,  Mod barrel  contour chest wall with bilateral  Distant bs s audible wheeze and  without cough on insp or exp maneuvers  and mod  Hyperresonant  to  percussion bilaterally     CV:  RRR  no s3 or murmur or increase in P2, and no edema   ABD:  soft and nontender with pos mid insp Hoover's  in the supine position. No bruits or organomegaly appreciated, bowel sounds nl  MS:     ext warm without deformities, calf tenderness, cyanosis or clubbing No obvious joint restrictions   SKIN: warm and dry without lesions    NEURO:  alert, approp, nl sensorium with  no motor or cerebellar deficits apparent.            Assessment:

## 2019-02-20 ENCOUNTER — Encounter: Payer: Self-pay | Admitting: Internal Medicine

## 2019-02-20 NOTE — Assessment & Plan Note (Signed)
4-5 min discussion re active cigarette smoking in addition to office E&M  Ask about tobacco use:   ongoing Advise quitting   Getting to be life or breath issue  Assess willingness:  Not committed at this point Assist in quit attempt:  Per PCP when ready Arrange follow up:   Follow up per Primary Care planned

## 2019-02-20 NOTE — Assessment & Plan Note (Signed)
Active smoker - reports quit smokng 06/2013> resumed by ov 04/11/2016   - Spirometry 08/19/2013  FEV1 1.85 ( 47%) ratio 44  - Spirometry 12/08/2015   FEV1 1.53 (43%)  Ratio 40 off maint rx  - 12/08/2015  After extensive coaching HFA effectiveness =    75% > start symb 160/atrovent qid   - Allergy profile 12/08/15  >  Eos 0.0 /  IgE  347  RAST pos dust/ mold - [facility] AT screen 12/08/15 > MM/ levels ok   - 10/11/2017    changed to spiriva smi   - 02/19/2019  After extensive coaching inhaler device,  effectiveness =    75% with smi and hfa     Group D in terms of symptom/risk and laba/lama/ICS  therefore appropriate rx at this point >>>  Continue symb/spiriva but re-educated on rx extensively today with mother present to reinforced instructions.         Each maintenance medication was reviewed in detail including emphasizing most importantly the difference between maintenance and prns and under what circumstances the prns are to be triggered using an action plan format where appropriate.  Total time for H and P, chart review, counseling, teaching device and generating customized AVS unique to this office visit / charting = 20 min

## 2019-02-26 ENCOUNTER — Telehealth: Payer: Self-pay | Admitting: Internal Medicine

## 2019-02-26 NOTE — Telephone Encounter (Signed)
Received a refill request from [facility] on [address] for an Epi-Pen for Michael [name]. I reviewed his medication list and did not see one previously listed. Only allergy listed is a shellfish allergy. Did not see any mentioning in his last OV about an Epi-Pen. Also did not see where he is receiving biologics from our office.    MW, please advise if you are ok with [place] placing an order for an Epi-Pen. Thanks!

## 2019-02-27 MED ORDER — EPINEPHRINE 0.3 MG/0.3ML IJ SOAJ: 0.3000 mg | Freq: Once | INTRAMUSCULAR | 0 refills | Status: AC

## 2019-02-27 NOTE — Telephone Encounter (Signed)
Ok for epipen, no refills

## 2019-02-27 NOTE — Telephone Encounter (Signed)
Prescriptions sent to pharmacy. Nothing further needed at this time.

## 2019-04-08 ENCOUNTER — Ambulatory Visit (INDEPENDENT_AMBULATORY_CARE_PROVIDER_SITE_OTHER): Payer: Medicare HMO | Admitting: *Deleted

## 2019-04-08 DIAGNOSIS — I495 Sick sinus syndrome: Secondary | ICD-10-CM | POA: Diagnosis not present

## 2019-04-10 LAB — CUP PACEART REMOTE DEVICE CHECK
Battery Impedance: 402 Ohm
Battery Remaining Longevity: 114 mo
Battery Voltage: 2.78 V
Brady Statistic AP VP Percent: 0 %
Brady Statistic AP VS Percent: 40 %
Brady Statistic AS VP Percent: 0 %
Brady Statistic AS VS Percent: 59 %
Date Time Interrogation Session: 20210406165123
Implantable Lead Implant Date: 20050421
Implantable Lead Implant Date: 20050421
Implantable Lead Location: 753859
Implantable Lead Location: 753860
Implantable Lead Model: 5076
Implantable Lead Model: 5076
Implantable Pulse Generator Implant Date: 20140904
Lead Channel Impedance Value: 472 Ohm
Lead Channel Impedance Value: 565 Ohm
Lead Channel Pacing Threshold Amplitude: 0.75 V
Lead Channel Pacing Threshold Amplitude: 0.75 V
Lead Channel Pacing Threshold Pulse Width: 0.4 ms
Lead Channel Pacing Threshold Pulse Width: 0.4 ms
Lead Channel Setting Pacing Amplitude: 1.5 V
Lead Channel Setting Pacing Amplitude: 2 V
Lead Channel Setting Pacing Pulse Width: 0.4 ms
Lead Channel Setting Sensing Sensitivity: 5.6 mV

## 2019-04-12 ENCOUNTER — Telehealth: Payer: Self-pay | Admitting: Cardiovascular Disease

## 2019-04-12 NOTE — Telephone Encounter (Signed)
Spoke with patient's mother to scheduled over due pacemaker check Offered Monday 06/17/19 at 8:20 am.  Mother states " patient will not get up that early"  I will call when July 2021 schedules are available.

## 2019-07-05 ENCOUNTER — Other Ambulatory Visit: Payer: Self-pay | Admitting: Internal Medicine

## 2019-07-09 ENCOUNTER — Ambulatory Visit (INDEPENDENT_AMBULATORY_CARE_PROVIDER_SITE_OTHER): Payer: Medicare HMO | Admitting: *Deleted

## 2019-07-09 DIAGNOSIS — I495 Sick sinus syndrome: Secondary | ICD-10-CM

## 2019-07-11 LAB — CUP PACEART REMOTE DEVICE CHECK
Battery Impedance: 452 Ohm
Battery Remaining Longevity: 106 mo
Battery Voltage: 2.78 V
Brady Statistic AP VP Percent: 0 %
Brady Statistic AP VS Percent: 40 %
Brady Statistic AS VP Percent: 0 %
Brady Statistic AS VS Percent: 59 %
Date Time Interrogation Session: 20210707131836
Implantable Lead Implant Date: 20050421
Implantable Lead Implant Date: 20050421
Implantable Lead Location: 753859
Implantable Lead Location: 753860
Implantable Lead Model: 5076
Implantable Lead Model: 5076
Implantable Pulse Generator Implant Date: 20140904
Lead Channel Impedance Value: 439 Ohm
Lead Channel Impedance Value: 539 Ohm
Lead Channel Pacing Threshold Amplitude: 0.625 V
Lead Channel Pacing Threshold Amplitude: 0.75 V
Lead Channel Pacing Threshold Pulse Width: 0.4 ms
Lead Channel Pacing Threshold Pulse Width: 0.4 ms
Lead Channel Setting Pacing Amplitude: 1.5 V
Lead Channel Setting Pacing Amplitude: 2.25 V
Lead Channel Setting Pacing Pulse Width: 0.52 ms
Lead Channel Setting Sensing Sensitivity: 5.6 mV

## 2019-07-11 NOTE — Progress Notes (Signed)
Remote pacemaker transmission.   

## 2019-07-18 ENCOUNTER — Other Ambulatory Visit: Payer: Self-pay

## 2019-07-18 ENCOUNTER — Encounter: Payer: Self-pay | Admitting: Cardiovascular Disease

## 2019-07-18 ENCOUNTER — Ambulatory Visit (INDEPENDENT_AMBULATORY_CARE_PROVIDER_SITE_OTHER): Payer: Medicare HMO | Admitting: Cardiovascular Disease

## 2019-07-18 VITALS — BP 138/67 | HR 64 | Ht 67.0 in | Wt 132.8 lb

## 2019-07-18 DIAGNOSIS — I495 Sick sinus syndrome: Secondary | ICD-10-CM | POA: Diagnosis not present

## 2019-07-18 DIAGNOSIS — Z950 Presence of cardiac pacemaker: Secondary | ICD-10-CM

## 2019-07-18 DIAGNOSIS — I480 Paroxysmal atrial fibrillation: Secondary | ICD-10-CM

## 2019-07-18 DIAGNOSIS — J449 Chronic obstructive pulmonary disease, unspecified: Secondary | ICD-10-CM

## 2019-07-18 DIAGNOSIS — R0789 Other chest pain: Secondary | ICD-10-CM

## 2019-07-18 NOTE — Patient Instructions (Signed)

## 2019-07-18 NOTE — Progress Notes (Addendum)
Cardiology Office Note:    Date:  07/19/2019   ID:  Michael [name], DOB 08/09/1960, MRN [id number]  PCP:  Marva PandaMillsaps, Kimberly, NP  [facility] Cardiologist:  No primary care provider on file.  [facility] Electrophysiologist:  None   Referring MD: Marva PandaMillsaps, Kimberly, NP   CC: Pacemaker follow up  History of Present Illness:    Michael [name] is a 59 y.o. male with a hx of sinus node dysfunction s/p pacemaker, atrial fibrillation, tobacco use disorder who presents for follow up of pacemaker. This is [name]'s first visit since 2018.   [name] states he has been experiencing substernal chest pain that occurs randomly. He has not noticed any correlation with exertion or rest. He is concerned that his pacemaker is not working correctly. He endorses SOB that is chronic. He denies any palpitations, focal neurological deficits, leg swelling. He has not been taking his Imdur. During examination, he noted that placing the interrogation generator on his chest exacerbated his chest pain.   [name] states he continues to smoke approximately 1 pack per day, however his mother, who accompanied him to this visit, estimates closer to 2 packs per day. She notes that he has been sleeping all day and stays up all night. In addition, she is notes inconsistent compliance with medication regimen, although she states he does miss any inhaler doses.    Pacemaker was interrogated today. Patient is not pacemaker dependent. His battery is intact with approximately 8.5 years of longevity left, or a minimum of 6.5. He is 40% atrial paced and 0.5% ventricular paced. All lead parameters were checked and within normal limits. He does have numerous episodes of atrial fibrillation last from minutes to a maximum of 36 minutes (2019) documented over the past 3 years. Unknown if symptomatic though.    Last stress test in 2018 showed diffuse hypokinesis. An area of defect noted in the apical inferior and apex region possibly  representing ischemia. Study was low risk.    Past Medical History:  Diagnosis Date  . Anginal pain (HCC)   . Anxiety   . Bradycardia   . Chronic chest pain   . COPD (chronic obstructive pulmonary disease) (HCC)   . Depression   . GERD (gastroesophageal reflux disease)   . History of nuclear stress test 06/14/2011   lexiscan; mild perfusion defect in basal inferosetpal & apical inferior region; negative for ischemia   . Hypothyroidism   . Pacemaker   . Pneumonia   . 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 Seizure disorder (HCC)   . Seizures (HCC)    as a baby  . Shortness of breath   . Tobacco abuse     Past Surgical History:  Procedure Laterality Date  . CARDIAC CATHETERIZATION  2002, 2003   normal coronaries  . EYE SURGERY  [age] y.o.  . GALLBLADDER SURGERY  2003  . HAND SURGERY  1998   x3  . OPEN REDUCTION INTERNAL FIXATION (ORIF) DISTAL RADIAL FRACTURE Right 09/07/2016   Procedure: RIGHT WRIST OPEN REDUCTION INTERNAL FIXATION (ORIF) REPAIR AS INDICATED;  Surgeon: Bradly Bienenstockrtmann, Fred, MD;  Location: [facility] OR;  Service: Orthopedics;  Laterality: Right;  . PACEMAKER INSERTION  04/23/2003   Medtronic Kappa[facility] - symptomatic [facility]ady[facility]  . PERMANENT PACEMAKER GENERATOR CHANGE N/A 09/06/2012   Procedure: PERMANENT PACEMAKER GENERATOR CHANGE;  Surgeon: Thurmon FairMihai Aristotelis Vilardi, MD;  Location: [facility] CATH LAB;  Service: Cardiovascular;  Laterality: N/A;  .  TONSILLECTOMY    . TRANSTHORACIC ECHOCARDIOGRAM  05/14/2012   EF 50-55%, normal systolic function; mild MR (ordered for mitral valve disease 424.0)    Current Medications: No outpatient medications have been marked as taking for the 07/18/19 encounter (Office Visit) with Thurmon Fair, MD.   Current Facility-Administered Medications for the 07/18/19 encounter (Office Visit) with Thurmon Fair, MD  Medication  . guaiFENesin (MUCINEX) 12 hr tablet 600 mg     Allergies:   Shellfish allergy     Social History   Socioeconomic History  . Marital status: Divorced    Spouse name: Not on file  . Number of children: 2  . Years of education: 60  . Highest education level: Not on file  Occupational History  . Occupation: Disabled  Tobacco Use  . Smoking status: Current Every Day Smoker    Packs/day: 1.00    Years: 40.00    Pack years: 40.00    Types: Cigarettes  . Smokeless tobacco: Never Used  Vaping Use  . Vaping Use: Never used  Substance and Sexual Activity  . Alcohol use: No  . Drug use: No  . Sexual activity: Not Currently  Other Topics Concern  . Not on file  Social History Narrative  . Not on file   Social Determinants of Health   Financial Resource Strain:   . Difficulty of Paying Living Expenses:   Food Insecurity:   . Worried About Programme researcher, broadcasting/film/video in the Last Year:   . Barista in the Last Year:   Transportation Needs:   . Freight forwarder (Medical):   [name] Lack of Transportation (Non-Medical):   Physical Activity:   . Days of Exercise per Week:   . Minutes of Exercise per Session:   Stress:   . Feeling of Stress :   Social Connections:   . Frequency of Communication with Friends and Family:   . Frequency of Social Gatherings with Friends and Family:   . Attends Religious Services:   . Active Member of Clubs or Organizations:   . Attends Banker Meetings:   [name] Marital Status:      Family History: The patient's *family history includes CAD in his father; COPD in his father; Heart Problems in his mother and another family member; Heart attack in his maternal grandmother, paternal grandfather, and paternal grandmother; Heart disease in his father; Hypertension in his maternal grandfather; Other in his sister.  ROS:   Please see the history of present illness.    All other systems reviewed and are negative.  EKGs/Labs/Other Studies Reviewed:    EKG: Atrial pacing with rate of 63. Diffuse repolarization changes  throughout that are non-specific. T-wave inversion in V1 and V2.   Recent Labs: Last lab work in 2019 showed creatinine of .78, potassium of 3.8, hemoglobin of 12.2.   Recent Lipid Panel    Component Value Date/Time   CHOL 153 09/04/2012 0839   TRIG 96 09/04/2012 0839   HDL 45 09/04/2012 0839   CHOLHDL 3.4 09/04/2012 0839   VLDL 19 09/04/2012 0839   LDLCALC 89 09/04/2012 0839   Physical Exam:    VS:  BP 138/67   Pulse 64   Ht 5\' 7"  (1.702 m)   Wt 132 lb 12.8 oz (60.2 kg)   BMI 20.80 kg/m     Wt Readings from Last 3 Encounters:  07/18/19 132 lb 12.8 oz (60.2 kg)  02/19/19 137 lb (62.1 kg)  01/12/18 137 lb (62.1  kg)     GEN: Appears older than stated age; In no acute distress.  HEENT: Normal NECK: No JVD; No carotid bruits LYMPHATICS: No lymphadenopathy CARDIAC: RRR, no murmurs, rubs, gallops RESPIRATORY:  Clear to auscultation without rales, wheezing or rhonchi  ABDOMEN: Soft, non-tender, non-distended MUSCULOSKELETAL:  No edema; No deformity  SKIN: Warm and dry NEUROLOGIC:  Alert and oriented x 3 PSYCHIATRIC:  Normal affect   ASSESSMENT:    1. Paroxysmal atrial fibrillation (HCC)   2. SSS (sick sinus syndrome) (HCC)   3. Pacemaker - Medtronic 2005, generator change September 2014   4. COPD GOLD III    5. Atypical chest pain    PLAN:    In order of problems listed above:  1. Atypical Chest Pain: Pain is not exacerbated by exertion or relieved by rest. Symptoms are intermittent and during examination today, exacerbated by placing generator on his chest. Unlikely to be angina related to ACS. Imdur was tried in the past, however had not taken it. This may also be related to his AF/AT noted on pacemaker interrogation, however he is current in sinus rhythm today and was experiencing pain. This may more likely be related to his COPD with concomitant heavy tobacco use. Recommend close follow up with his pulmonologist [name] and tobacco cessation.  2. Sinus Node  Dysfunction: Pacemaker interrogated today and patient is not pace dependent. Atrial pacing approximately 40%.  3. Atrial Fibrillation: Many episodes noted in the past 3 years with longest lasting 36 minutes. CHADSVasc of 0. No indication for anticoagulation at this time.  4. COPD: Follows with [name]. Compliant with inhaler regimen.     I have seen and examined the patient along with Verdene Lennert, MD.  I have reviewed the chart, notes and new data.  I agree with her note.  Key new complaints: complains of chest pain with features suggesting musculoskeletal pain. No angina. Key examination changes: gaunt, undernourished, poorly kempt. Drowsy. Key new findings / data: Normal PM function. Not dependent. 40% A paced, <1% V paced. Rare episodes of paroxysmal atrial fibrillation (longest 14 minutes) and occasional nonsustained PAT with 1:1 AV conduction.  PLAN: Arrhythmia is asymptomatic. CHADSVasc zero, anticoagulation is not indicated. Strongly recommend reduction in caffeine intake and smoking cessation. Continue remote pacemaker downloads.  Thurmon Fair, MD, [facility] [phone] 07/19/2019, 6:04 PM   Medication Adjustments/Labs and Tests Ordered: Current medicines are reviewed at length with the patient today.  Concerns regarding medicines are outlined above.  Orders Placed This Encounter  Procedures  . EKG 12-Lead   No orders of the defined types were placed in this encounter.   Patient Instructions  Medication Instructions:  No changes *If you need a refill on your cardiac medications before your next appointment, please call your pharmacy*   Lab Work: None ordered If you have labs (blood work) drawn today and your tests are completely normal, you will receive your results only by: [name] MyChart Message (if you have MyChart) OR . A paper copy in the mail If you have any lab test that is abnormal or we need to change your treatment, we will call you to review  the results.   Testing/Procedures: None ordered   Follow-Up: At [facility], you and your health needs are our priority.  As part of our continuing mission to provide you with exceptional heart care, we have created designated Provider Care Teams.  These Care Teams include your primary Cardiologist (physician) and Advanced Practice Providers (APPs -  Physician Assistants and Nurse Practitioners) who all work together to provide you with the care you need, when you need it.  We recommend signing up for the patient portal called "MyChart".  Sign up information is provided on this After Visit Summary.  MyChart is used to connect with patients for Virtual Visits (Telemedicine).  Patients are able to view lab/test results, encounter notes, upcoming appointments, etc.  Non-urgent messages can be sent to your provider as well.   To learn more about what you can do with MyChart, go to [email or web address].    Your next appointment:   12 month(s)  The format for your next appointment:   In Person  Provider:   Thurmon Fair, MD      Signed, Dr. Verdene Lennert Internal Medicine PGY-1  07/19/2019, 6:04 PM

## 2019-07-18 NOTE — Progress Notes (Signed)
Patient ID: Michael [name], male   DOB: [birth year], 59 y.o.   MRN: [id number]     Cardiology Office Note    Date:  07/18/2019   ID:  Michael [name], DOB [birth year], MRN [id number]  PCP:  Everardo Beals, NP  Cardiologist:   Sanda Klein, MD   chief complaint: chest pain   History of Present Illness:  Michael [name] is a 59 y.o. male with symptomatic bradycardia due to sinus node dysfunction, returning for a pacemaker check. As before, his compliance with follow-up and medications is poor. Nevertheless, this is the second time he has come for a pacemaker check this year which is a definite improvement.   He is not very communicative today. One of his cousins, very close to him, died this weekend of cancer.  His nuclear stress test in April reported a mildly reduced left ventricular ejection fraction at 49%. There was a very small area of inferoapical ischemia but the study was low risk. Echo in 2014 showed a similar low normal LVEF of 50-55% and showed equivocal findings for an inferior wall motion abnormality.  He continues to smoke a pack a day per his report, but his mother Michael [name] (also our pacemaker patient) anxiety probably smokes at least twice as much. He has not been troubled by chest pain again. He has had generalized muscle cramps, but does not take a diuretic or a statin. He does not have exertional angina. He has problems with coughing and wheezing. He has not had syncope, palpitations, dizziness, leg edema, orthopnea or PND. He saw Dr. Melvyn Novas or COPD earlier this month.  Device interrogation shows normal function. The current generator is a Medtronic Adapta implanted as a generator change out in 2014. Battery voltage suggests another 11 years of longevity. There is 38% atrial pacing and only 0.3% ventricular pacing. Very rare episodes of atrial fibrillation/paroxysmal atrial tachycardia , none of these episodes have been symptomatic. Longest was about 90 seconds in duration 163  bpm.    Past Medical History:  Diagnosis Date   Anginal pain (HCC)    Anxiety    Bradycardia    Chronic chest pain    COPD (chronic obstructive pulmonary disease) (HCC)    Depression    GERD (gastroesophageal reflux disease)    History of nuclear stress test 06/14/2011   lexiscan; mild perfusion defect in basal inferosetpal & apical inferior region; negative for ischemia    Hypothyroidism    Pacemaker    Pneumonia    Presence of permanent cardiac pacemaker April 2005   For bradycardia   Seizure disorder ([place])    Seizures ([place])    as a baby   Shortness of breath    Tobacco abuse     Past Surgical History:  Procedure Laterality Date   CARDIAC CATHETERIZATION  2002, 2003   normal coronaries   EYE SURGERY  [age] y.o.   GALLBLADDER SURGERY  2003   HAND SURGERY  1998   x3   OPEN REDUCTION INTERNAL FIXATION (ORIF) DISTAL RADIAL FRACTURE Right 09/07/2016   Procedure: RIGHT WRIST OPEN REDUCTION INTERNAL FIXATION (ORIF) REPAIR AS INDICATED;  Surgeon: Iran Planas, MD;  Location: [facility];  Service: Orthopedics;  Laterality: Right;   PACEMAKER INSERTION  04/23/2003   Medtronic Kappa; [facility] - symptomatic bradycardia   PERMANENT PACEMAKER GENERATOR CHANGE N/A 09/06/2012   Procedure: PERMANENT PACEMAKER GENERATOR CHANGE;  Surgeon: Sanda Klein, MD;  Location: [facility];  Service: Cardiovascular;  Laterality: N/A;  TONSILLECTOMY     TRANSTHORACIC ECHOCARDIOGRAM  05/14/2012   EF 12-87%, normal systolic function; mild MR (ordered for mitral valve disease 424.0)    Outpatient Medications Prior to Visit  Medication Sig Dispense Refill   Acetaminophen (TYLENOL) 325 MG CAPS Take by mouth as needed.     albuterol (ACCUNEB) 1.25 MG/3ML nebulizer solution USE 1 VIAL IN NEBULIZER EVERY 6 HOURS AS NEEDED FOR WHEEZING 150 mL 0   FLUoxetine (PROZAC) 40 MG capsule Take 1 capsule (40 mg total) by mouth daily. 90 capsule 1   fluticasone (FLONASE) 50  MCG/ACT nasal spray Place 2 sprays into both nostrils as needed.     lactose free nutrition (BOOST PLUS) LIQD Take 237 mLs by mouth 3 (three) times daily with meals. 30 Can 0   Multiple Vitamin (MULTIVITAMIN WITH MINERALS) TABS tablet Take 1 tablet by mouth daily.     omeprazole (PRILOSEC) 20 MG capsule Take 20 mg by mouth 2 (two) times daily.      SYMBICORT 160-4.5 MCG/ACT inhaler Inhale 2 puffs into the lungs 2 (two) times daily. 10.2 g 11   Tiotropium Bromide Monohydrate (SPIRIVA RESPIMAT) 2.5 MCG/ACT AERS Inhale 2 puffs into the lungs daily. 4 g 11   VENTOLIN HFA 108 (90 Base) MCG/ACT inhaler Up to 2 pffs every 4 hours as needed 54 g 11   aspirin EC 81 MG tablet Take 81 mg by mouth daily.     isosorbide mononitrate (IMDUR) 30 MG 24 hr tablet TAKE 1 TABLET BY MOUTH ONCE DAILY. PLEASE MAKE AN APPT FOR FUTURE REFILLS 60 tablet 0   Facility-Administered Medications Prior to Visit  Medication Dose Route Frequency Provider Last Rate Last Admin   guaiFENesin (MUCINEX) 12 hr tablet 600 mg  600 mg Oral BID Lavina Hamman, MD         Allergies:   Shellfish allergy   Social History   Socioeconomic History   Marital status: Divorced    Spouse name: Not on file   Number of children: 2   Years of education: 12   Highest education level: Not on file  Occupational History   Occupation: Disabled  Tobacco Use   Smoking status: Current Every Day Smoker    Packs/day: 1.00    Years: 40.00    Pack years: 40.00    Types: Cigarettes   Smokeless tobacco: Never Used  Scientific laboratory technician Use: Never used  Substance and Sexual Activity   Alcohol use: No   Drug use: No   Sexual activity: Not Currently  Other Topics Concern   Not on file  Social History Narrative   Not on file   Social Determinants of Health   Financial Resource Strain:    Difficulty of Paying Living Expenses:   Food Insecurity:    Worried About Charity fundraiser in the Last Year:    Academic librarian in the Last Year:   Transportation Needs:    Film/video editor (Medical):    Lack of Transportation (Non-Medical):   Physical Activity:    Days of Exercise per Week:    Minutes of Exercise per Session:   Stress:    Feeling of Stress :   Social Connections:    Frequency of Communication with Friends and Family:    Frequency of Social Gatherings with Friends and Family:    Attends Religious Services:    Active Member of Clubs or Organizations:    Attends Archivist Meetings:  Marital Status:      Family History:  The patient's family history includes CAD in his father; COPD in his father; Heart Problems in his mother and another family member; Heart attack in his maternal grandmother, paternal grandfather, and paternal grandmother; Heart disease in his father; Hypertension in his maternal grandfather; Other in his sister.   ROS:   Please see the history of present illness.    ROS All other systems reviewed and are negative.   PHYSICAL EXAM:   VS:  BP 138/67    Pulse 64    Ht _0  (1.702 m)    Wt 132 lb 12.8 oz (60.2 kg)    BMI 20.80 kg/m    GEN: Well nourished, well developed, in no acute distress   General: Alert, oriented x3, no distress Head: no evidence of trauma, PERRL, EOMI, no exophtalmos or lid lag, no myxedema, no xanthelasma; normal ears, nose and oropharynx Neck: normal jugular venous pulsations and no hepatojugular reflux; brisk carotid pulses without delay and no carotid bruits Chest: clear to auscultation, no signs of consolidation by percussion or palpation, normal fremitus, symmetrical and full respiratory excursions Cardiovascular: normal position and quality of the apical impulse, regular rhythm, normal first and second heart sounds, no murmurs, rubs or gallops Abdomen: no tenderness or distention, no masses by palpation, no abnormal pulsatility or arterial bruits, normal bowel sounds, no hepatosplenomegaly Extremities: no  clubbing, cyanosis or edema; 2+ radial, ulnar and brachial pulses bilaterally; 2+ right femoral, posterior tibial and dorsalis pedis pulses; 2+ left femoral, posterior tibial and dorsalis pedis pulses; no subclavian or femoral bruits Neurological: grossly nonfocal Psych: euthymic mood, full affect  Wt Readings from Last 3 Encounters:  07/18/19 132 lb 12.8 oz (60.2 kg)  02/19/19 137 lb (62.1 kg)  01/12/18 137 lb (62.1 kg)      Studies/Labs Reviewed:   EKG:  EKG is not ordered today.    Recent Labs: No results found for requested labs within last 8760 hours.   Lipid Panel    Component Value Date/Time   CHOL 153 09/04/2012 0839   TRIG 96 09/04/2012 0839   HDL 45 09/04/2012 0839   CHOLHDL 3.4 09/04/2012 0839   VLDL 19 09/04/2012 0839   LDLCALC 89 09/04/2012 0839     ASSESSMENT:    No diagnosis found.   PLAN:  In order of problems listed above:  1. Abnormal nuclear stress test: Images reviewed and they indeed appear to be low risk. He seems to be asymptomatic after starting long-acting nitrates. He is taking aspirin daily. The focus is on risk factor medication. Need to get an updated lipid profile 2. SSS: No symptoms of bradycardia or chronotropic incompetence with current pacemaker settings.  3. PM: Normal pacemaker function. He has not been compliant with remote follow-up in the past, but we may be able to get him interested in using the Smart phone app.  4. PAT/AFib: His episodes of atrial fibrillation are very infrequent (a few times a year year) very brief (always under 10 minutes, rarely over 2 minutes) and asymptomatic. Specific therapy is not indicated. His embolic risk is low. CHADSVasc score zero ( score 1 if we consider him as having CAD). Continue aspirin. He would be a very poor candidate for full anticoagulation anyway,  because of his compliance issues. 5. Smoking: Strongly encouraged to quit smoking, but he expressed no interest in this whatsoever.  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    Medication Adjustments/Labs and Tests Ordered: Current medicines are reviewed at length with the patient today.  Concerns regarding medicines are outlined above.  Medication changes, Labs and Tests ordered today are listed in the Patient Instructions below. There are no Patient Instructions on file for this visit.     Signed, Sanda Klein, MD  07/18/2019 1:30 PM    [facility] [address], [place], [place]  [address] Phone: [phone]; Fax: [phone]   .

## 2019-07-19 ENCOUNTER — Encounter: Payer: Self-pay | Admitting: Cardiovascular Disease

## 2019-08-20 ENCOUNTER — Ambulatory Visit: Payer: Medicare HMO | Admitting: Internal Medicine

## 2019-10-04 ENCOUNTER — Ambulatory Visit (INDEPENDENT_AMBULATORY_CARE_PROVIDER_SITE_OTHER): Payer: Medicare HMO | Admitting: Internal Medicine

## 2019-10-04 ENCOUNTER — Other Ambulatory Visit: Payer: Self-pay

## 2019-10-04 ENCOUNTER — Ambulatory Visit (INDEPENDENT_AMBULATORY_CARE_PROVIDER_SITE_OTHER): Payer: Medicare HMO

## 2019-10-04 ENCOUNTER — Encounter: Payer: Self-pay | Admitting: Internal Medicine

## 2019-10-04 DIAGNOSIS — J449 Chronic obstructive pulmonary disease, unspecified: Secondary | ICD-10-CM | POA: Diagnosis not present

## 2019-10-04 DIAGNOSIS — F17210 Nicotine dependence, cigarettes, uncomplicated: Secondary | ICD-10-CM

## 2019-10-04 DIAGNOSIS — J9612 Chronic respiratory failure with hypercapnia: Secondary | ICD-10-CM

## 2019-10-04 MED ORDER — VENTOLIN HFA 108 (90 BASE) MCG/ACT IN AERS: INHALATION_SPRAY | RESPIRATORY_TRACT | 3 refills | Status: DC

## 2019-10-04 MED ORDER — SPIRIVA RESPIMAT 2.5 MCG/ACT IN AERS: 2.0000 | INHALATION_SPRAY | Freq: Every day | RESPIRATORY_TRACT | 3 refills | Status: DC

## 2019-10-04 MED ORDER — SYMBICORT 160-4.5 MCG/ACT IN AERO: 2.0000 | INHALATION_SPRAY | Freq: Two times a day (BID) | RESPIRATORY_TRACT | 3 refills | Status: DC

## 2019-10-04 NOTE — Assessment & Plan Note (Signed)
Counseled re importance of smoking cessation but did not meet time criteria for separate billing            Each maintenance medication was reviewed in detail including emphasizing most importantly the difference between maintenance and prns and under what circumstances the prns are to be triggered using an action plan format where appropriate.  Total time for H and P, chart review, counseling, teaching device/  directly observing portions of ambulatory 02 saturation study/  and generating customized AVS unique to this office visit / charting =  33 min

## 2019-10-04 NOTE — Assessment & Plan Note (Signed)
Active smoker - reports quit smokng 06/2013> resumed by ov 04/11/2016   - Spirometry 08/19/2013  FEV1 1.85 ( 47%) ratio 44  - Spirometry 12/08/2015   FEV1 1.53 (43%)  Ratio 40 off maint rx  - 12/08/2015  After extensive coaching HFA effectiveness =    75% > start symb 160/atrovent qid   - Allergy profile 12/08/15  >  Eos 0.0 /  IgE  347  RAST pos dust/ mold - [facility] AT screen 12/08/15 > MM/ levels ok   - 10/11/2017    changed to spiriva smi   - 10/04/2019  After extensive coaching inhaler device,  effectiveness =    75% (short Ti) - rec empty device use to practice prior to daily hfa/smi use   Group D in terms of symptom/risk and laba/lama/ICS  therefore appropriate rx at this point >>>  Continue symb 160/ spiriva  I spent extra time with pt today reviewing appropriate use of albuterol for prn use on exertion with the following points: 1) saba is for relief of sob that does not improve by walking a slower pace or resting but rather if the pt does not improve after trying this first. 2) If the pt is convinced, as many are, that saba helps recover from activity faster then it's easy to tell if this is the case by re-challenging : ie stop, take the inhaler, then p 5 minutes try the exact same activity (intensity of workload) that just caused the symptoms and see if they are substantially diminished or not after saba 3) if there is an activity that reproducibly causes the symptoms, try the saba 15 min before the activity on alternate days   If in fact the saba really does help, then fine to continue to use it prn but advised may need to look closer at the maintenance regimen being used to achieve better control of airways disease with exertion.

## 2019-10-04 NOTE — Progress Notes (Signed)
Subjective:     Patient ID: Michael [name], male   DOB: 10/20/1960      MRN: [id number]    Brief patient profile:  59 yowm MM active smoker referred to pulmonary clinic 12/08/2015 by Dr  Ilsa Iha with GOLD III copd documented 08/19/13     History of Present Illness  08/19/13 ov / [place] Sche[place] GOLD III copd  rec Continue symbicort 160 Take 2 puffs first thing in am and then another 2 puffs about 12 hours later.  Only use your  atrovent as a rescue medication Work on inhaler technique:     12/08/2015  Re-establish/ extended  Pulmonary office visit/ [name]  maint rx qvar / atrovent prn Chief Complaint  Patient presents with  . Pulm Consult    Pt. has a history of COPD.   breathing gradually worse x sev months / assoc with coughing > mostly white  rx zpak/ prednisone x 12/04/15 no better  Doe = MMRC3 = can't walk 100 yards even at a slow pace at a flat grade s stopping due to sob   rec Plan A = Automatic = Symbicort 160 Take 2 puffs first thing in am and then another 2 puffs about 12 hours later.                                      Atrovent 2 puffs four times day  Plan B = Backup Only use your albuterol (Ventolin)as a rescue medication  Plan C = Crisis - only use your albuterol nebulizer if you first try Plan B  .     07/11/2017  f/u ov/Michael [name] re:  GOLD III copd / still smoking  Chief Complaint  Patient presents with  . Follow-up    Breathing is about the same. He has good days and bad. Still smoking 1 ppd.    Dyspnea:  MMRC3 = can't walk 100 yards even at a slow pace at a flat grade s stopping due to sob  / still able to do grocery shopping Cough: worse the more he smokes  Sleeping: variably prop up  SABA use: variable depending on activity  02: none needed  rec Plan A = Automatic = symbicort 160 x 2pff in am followed by Spiriva and then symbicort x 2 puff 12 hours later Work on inhaler technique:  Plan B = Backup Only use your albuterol as a rescue medication  Plan C = Crisis - only use your  albuterol nebulizer if you first try Plan B The key is to stop smoking completely before smoking completely stops you! Please schedule a follow up visit in 3 months but call sooner if needed  with all medications /inhalers/ solutions in hand so we can verify exactly what you are taking. This includes all medications from all doctors and over the counters    10/11/2017  f/u ov/Michael [name] re: COPD III/ still smoking maint on symb/ spiriva dpi  Chief Complaint  Patient presents with  . Follow-up    Breathing "depends on the heat outside"- he is unsure of meds and states "needs refills on everything".    Dyspnea:  MMRC3 = can't walk 100 yards even at a slow pace at a flat grade s stopping due to sob   Cough: provoked  with active smoking and ? From dpi spiriva also/ min mucoid  Sleeping: bed flat with a couple of pillows  SABA use: way  too much when weather hot  02: none  rec Change spiriva to Respimat form  and use 2 pffs first thing in am only to follow the symbicort x 2 puffs only in the am, then take the other 2 pffs of symbicort 12 hours later  Work on inhaler technique:  The key is to stop smoking completely before smoking completely stops you!  Please schedule a follow up visit in 3 months but call sooner if needed  with all medications     01/12/2018  f/u ov/Michael [name] re:  COPD  GOLD III / still smoking  Chief Complaint  Patient presents with  . Follow-up    Breathing is unchanged. He is using his albuterol inhaler approx 10 x per day. He rarely uses his neb.   Dyspnea:  MMRC3 = can't walk 100 yards even at a slow pace at a flat grade s stopping due to sob   Cough: smoking rattle  Sleeping: bed flat / 2 pillows  SABA use: [place] rec Only use your albuterol as a rescue medication to be used if you can't catch your breath The key is to stop smoking completely before smoking completely stops you! For smoking cessation classes call [phone]    Please schedule a follow up visit in 3 months but  call sooner if needed  with all medications /inhalers/ solutions in hand so we can verify exactly what you are taking. This includes all medications from all doctors and over the counters   02/19/2019  f/u ov/Michael [name] re: GOLD III still smoking extremely poor insight re meds  Chief Complaint  Patient presents with  . Follow-up    69yr f/u for COPD. States his nebulizer has stopped working. Has not new supplies.    Dyspnea:  MMRC3 = can't walk 100 yards even at a slow pace at a flat grade s stopping due to sob  Can't do shopping at [employer] Cough: clear mucus  Sleeping: nightly uses saba but poor baseline hfa  SABA use: way too much since neb stopped working  02: none  rec Work on inhaler technique:   The key is to stop smoking completely before smoking completely stops you!   10/04/2019  f/u ov/Michael [name] re:  GOLD III / still smoking/ maint on symb/ spiriva but taking  pm dose  sym at hs  Chief Complaint  Patient presents with  . Follow-up    pt is here for refills on meds  Dyspnea:  One aisle s stopping at [employer] (note not reproducible in office)  Cough: up to an hour / min mucoid tsp or two Sleeping: hip stops him / bed is flat with 2-3 pillows  SABA use: way too much with activity/ never pre challenges  02: none    No obvious day to day or daytime variability or assoc   purulent sputum or mucus plugs or hemoptysis or cp or chest tightness, subjective wheeze or overt sinus or hb symptoms.     Also denies any obvious fluctuation of symptoms with weather or environmental changes or other aggravating or alleviating factors except as outlined above   No unusual exposure hx or h/o childhood pna/ asthma or knowledge of premature birth.  Current Allergies, Complete Past Medical History, Past Surgical History, Family History, and Social History were reviewed in [employer] record.  ROS  The following are not active complaints unless bolded Hoarseness, sore throat, dysphagia,  dental problems, itching, sneezing,  nasal congestion or discharge of excess mucus or  purulent secretions, ear ache,   fever, chills, sweats, unintended wt loss or wt gain, classically pleuritic or exertional cp,  orthopnea pnd or arm/hand swelling  or leg swelling, presyncope, palpitations, abdominal pain, anorexia, nausea, vomiting, diarrhea  or change in bowel habits or change in bladder habits, change in stools or change in urine, dysuria, hematuria,  rash, arthralgias, visual complaints, headache, numbness, weakness or ataxia or problems with walking or coordination,  change in mood or  memory.        Current Meds  Medication Sig  . albuterol (ACCUNEB) 1.25 MG/3ML nebulizer solution USE 1 VIAL IN NEBULIZER EVERY 6 HOURS AS NEEDED FOR WHEEZING  . FLUoxetine (PROZAC) 40 MG capsule Take 1 capsule (40 mg total) by mouth daily.  . fluticasone (FLONASE) 50 MCG/ACT nasal spray Place 2 sprays into both nostrils as needed.  . lactose free nutrition (BOOST PLUS) LIQD Take 237 mLs by mouth 3 (three) times daily with meals.  . Multiple Vitamin (MULTIVITAMIN WITH MINERALS) TABS tablet Take 1 tablet by mouth daily.  [name]. omeprazole (PRILOSEC) 20 MG capsule Take 20 mg by mouth 2 (two) times daily.   . SYMBICORT 160-4.5 MCG/ACT inhaler Inhale 2 puffs into the lungs 2 (two) times daily.  . Tiotropium Bromide Monohydrate (SPIRIVA RESPIMAT) 2.5 MCG/ACT AERS Inhale 2 puffs into the lungs daily.  Terald Sleeper. VENTOLIN HFA 108 (90 Base) MCG/ACT inhaler Up to 2 pffs every 4 hours as needed   Current Facility-Administered Medications for the 10/04/19 encounter (Office Visit) with Nyoka CowdenWert, Cassara Nida B, MD  Medication  . guaiFENesin (MUCINEX) 12 hr tablet 600 mg                       Objective:   Physical Exam  amb wm nad    10/04/2019         126  02/19/2019         137  01/12/2018       137  10/11/2017       131  07/11/2017         125  01/11/2017         139  07/11/2016         144   04/11/2016        147          12/08/15  157 lb 3.2 oz (71.3 kg)  11/04/15 153 lb 12.8 oz (69.8 kg)  08/25/15 160 lb 12.8 oz (72.9 kg)    .Vital signs reviewed  10/04/2019  - Note at rest 02 sats  98% on RA       Edentulous  By record   HEENT : pt wearing mask not removed for exam due to covid -19 concerns.    NECK :  without JVD/Nodes/TM/ nl carotid upstrokes bilaterally   LUNGS: no acc muscle use,  Mod barrel  contour chest wall with bilateral  Distant bs s audible wheeze and  without cough on insp or exp maneuvers and mod  Hyperresonant  to  percussion bilaterally     CV:  RRR  no s3 or murmur or increase in P2, and no edema   ABD:  soft and nontender with pos mid insp Hoover's  in the supine position. No bruits or organomegaly appreciated, bowel sounds nl  MS:     ext warm without deformities, calf tenderness, cyanosis or clubbing No obvious joint restrictions   SKIN: warm and dry without lesions    NEURO:  alert, approp, nl  sensorium with  no motor or cerebellar deficits apparent.          CXR PA and Lateral:   10/04/2019 :    I personally reviewed images and   impression as follows:  1. No acute cardiopulmonary disease. 2. Mildly hyperinflated lungs and emphysema, compatible with reported COPD.   Assessment:

## 2019-10-04 NOTE — Assessment & Plan Note (Signed)
HCO3 12/08/2015 = 34  HC03   03/18/17    = 27 -   10/04/2019   Walked RA  3 laps @ approx 239ft each @ nl pace  stopped due to end of study,  Mild sob at end with sats still 95%   Despite smoking does surprisingly well now with walking/ no 02 needed

## 2019-10-04 NOTE — Patient Instructions (Addendum)
Plan A = Automatic = Always=    Symbicort 160 x 2 puffs 1st thing in am and 12 hours later and spiriva is 2 pffs each am   - remember to take the practice "swings" like a golfer before using any of these devices   Plan B = Backup (to supplement plan A, not to replace it) Only use your albuterol inhaler as a rescue medication to be used if you can't catch your breath by resting or doing a relaxed purse lip breathing pattern.  - The less you use it, the better it will work when you need it. - Ok to use the inhaler up to 2 puffs  every 4 hours if you must but call for appointment if use goes up over your usual need - Don't leave home without it !!  (think of it like the spare tire for your car)   Plan C = Crisis (instead of Plan B but only if Plan B stops working) - only use your albuterol nebulizer if you first try Plan B and it fails to help > ok to use the nebulizer up to every 4 hours but if start needing it regularly call for immediate appointment   The key is to stop smoking completely before smoking completely stops you!  Please remember to go to the  x-ray department  for your tests - we will call you with the results when they are available     Please schedule a follow up visit in 12 months but call sooner if needed

## 2019-10-07 ENCOUNTER — Ambulatory Visit (INDEPENDENT_AMBULATORY_CARE_PROVIDER_SITE_OTHER): Payer: Medicare HMO

## 2019-10-07 DIAGNOSIS — I495 Sick sinus syndrome: Secondary | ICD-10-CM | POA: Diagnosis not present

## 2019-10-07 NOTE — Progress Notes (Signed)
Pt notified of results

## 2019-10-09 LAB — CUP PACEART REMOTE DEVICE CHECK
Battery Impedance: 501 Ohm
Battery Remaining Longevity: 103 mo
Battery Voltage: 2.77 V
Brady Statistic AP VP Percent: 0 %
Brady Statistic AP VS Percent: 40 %
Brady Statistic AS VP Percent: 1 %
Brady Statistic AS VS Percent: 59 %
Date Time Interrogation Session: 20211005161625
Implantable Lead Implant Date: 20050421
Implantable Lead Implant Date: 20050421
Implantable Lead Location: 753859
Implantable Lead Location: 753860
Implantable Lead Model: 5076
Implantable Lead Model: 5076
Implantable Pulse Generator Implant Date: 20140904
Lead Channel Impedance Value: 472 Ohm
Lead Channel Impedance Value: 531 Ohm
Lead Channel Pacing Threshold Amplitude: 0.625 V
Lead Channel Pacing Threshold Amplitude: 1.125 V
Lead Channel Pacing Threshold Pulse Width: 0.4 ms
Lead Channel Pacing Threshold Pulse Width: 0.4 ms
Lead Channel Setting Pacing Amplitude: 1.5 V
Lead Channel Setting Pacing Amplitude: 2.25 V
Lead Channel Setting Pacing Pulse Width: 0.4 ms
Lead Channel Setting Sensing Sensitivity: 5.6 mV

## 2019-10-10 NOTE — Progress Notes (Signed)
Remote pacemaker transmission.   

## 2019-11-01 ENCOUNTER — Other Ambulatory Visit: Payer: Self-pay | Admitting: Cardiovascular Disease

## 2019-11-02 ENCOUNTER — Ambulatory Visit: Payer: Medicare HMO | Attending: Internal Medicine

## 2019-11-02 DIAGNOSIS — Z23 Encounter for immunization: Secondary | ICD-10-CM

## 2019-11-02 NOTE — Progress Notes (Signed)
   Covid-19 Vaccination Clinic  Name:  Michael [name]    MRN: [id number] DOB: 1960/08/03  11/02/2019  [name] was observed post Covid-19 immunization for 30 minutes based on pre-vaccination screening without incident. He was provided with Vaccine Information Sheet and instruction to access the V-Safe system.   [name] was instructed to call 911 with any severe reactions post vaccine: [name] Difficulty breathing  . Swelling of face and throat  . A fast heartbeat  . A bad rash all over body  . Dizziness and weakness

## 2019-11-11 ENCOUNTER — Ambulatory Visit (INDEPENDENT_AMBULATORY_CARE_PROVIDER_SITE_OTHER): Payer: Medicare HMO | Admitting: Physician Assistant

## 2019-11-11 ENCOUNTER — Encounter: Payer: Self-pay | Admitting: Physician Assistant

## 2019-11-11 ENCOUNTER — Other Ambulatory Visit: Payer: Self-pay

## 2019-11-11 VITALS — BP 110/80 | HR 76 | Temp 98.4°F | Resp 16 | Ht 69.0 in | Wt 128.0 lb

## 2019-11-11 DIAGNOSIS — G894 Chronic pain syndrome: Secondary | ICD-10-CM

## 2019-11-11 DIAGNOSIS — J441 Chronic obstructive pulmonary disease with (acute) exacerbation: Secondary | ICD-10-CM | POA: Diagnosis not present

## 2019-11-11 DIAGNOSIS — Z950 Presence of cardiac pacemaker: Secondary | ICD-10-CM | POA: Diagnosis not present

## 2019-11-11 DIAGNOSIS — F32A Depression, unspecified: Secondary | ICD-10-CM

## 2019-11-11 DIAGNOSIS — R233 Spontaneous ecchymoses: Secondary | ICD-10-CM

## 2019-11-11 DIAGNOSIS — K219 Gastro-esophageal reflux disease without esophagitis: Secondary | ICD-10-CM

## 2019-11-11 DIAGNOSIS — R238 Other skin changes: Secondary | ICD-10-CM | POA: Diagnosis not present

## 2019-11-11 DIAGNOSIS — J45909 Unspecified asthma, uncomplicated: Secondary | ICD-10-CM | POA: Insufficient documentation

## 2019-11-11 DIAGNOSIS — J157 Pneumonia due to Mycoplasma pneumoniae: Secondary | ICD-10-CM | POA: Insufficient documentation

## 2019-11-11 DIAGNOSIS — E559 Vitamin D deficiency, unspecified: Secondary | ICD-10-CM | POA: Insufficient documentation

## 2019-11-11 MED ORDER — PREGABALIN 75 MG PO CAPS: 75.0000 mg | ORAL_CAPSULE | Freq: Two times a day (BID) | ORAL | 1 refills | Status: DC

## 2019-11-11 NOTE — Patient Instructions (Addendum)
Please go to the lab today for blood work.  I will call you with your results. We will alter treatment regimen(s) if indicated by your results.   Please continue your Prozac daily. I am adding on Lyrica twice daily -- for pain, anxiety and hopefully will help with sleep.  Let's follow-up in 3-4 weeks to reassess and make further adjustments.   Continue Tylenol Extra Strength every 8 hours as needed for breakthrough pain. Start topical Voltaren to the wrists and hands bilaterally. Can also use on the hips.   It was very nice meeting you today. Welcome to [facility]!
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
## 2019-11-12 ENCOUNTER — Other Ambulatory Visit (INDEPENDENT_AMBULATORY_CARE_PROVIDER_SITE_OTHER): Payer: Medicare HMO

## 2019-11-12 ENCOUNTER — Telehealth: Payer: Self-pay | Admitting: Physician Assistant

## 2019-11-12 DIAGNOSIS — R748 Abnormal levels of other serum enzymes: Secondary | ICD-10-CM

## 2019-11-12 DIAGNOSIS — K219 Gastro-esophageal reflux disease without esophagitis: Secondary | ICD-10-CM

## 2019-11-12 LAB — CBC WITH DIFFERENTIAL/PLATELET
Basophils Absolute: 0.1 10*3/uL (ref 0.0–0.1)
Basophils Relative: 1 % (ref 0.0–3.0)
Eosinophils Absolute: 0.2 10*3/uL (ref 0.0–0.7)
Eosinophils Relative: 2.4 % (ref 0.0–5.0)
HCT: 43.8 % (ref 39.0–52.0)
Hemoglobin: 15 g/dL (ref 13.0–17.0)
Lymphocytes Relative: 18.9 % (ref 12.0–46.0)
Lymphs Abs: 1.4 10*3/uL (ref 0.7–4.0)
MCHC: 34.3 g/dL (ref 30.0–36.0)
MCV: 98.4 fl (ref 78.0–100.0)
Monocytes Absolute: 0.5 10*3/uL (ref 0.1–1.0)
Monocytes Relative: 7 % (ref 3.0–12.0)
Neutro Abs: 5.1 10*3/uL (ref 1.4–7.7)
Neutrophils Relative %: 70.7 % (ref 43.0–77.0)
Platelets: 306 10*3/uL (ref 150.0–400.0)
RBC: 4.45 Mil/uL (ref 4.22–5.81)
RDW: 13.2 % (ref 11.5–15.5)
WBC: 7.2 10*3/uL (ref 4.0–10.5)

## 2019-11-12 LAB — COMPREHENSIVE METABOLIC PANEL
ALT: 9 U/L (ref 0–53)
AST: 13 U/L (ref 0–37)
Albumin: 4.3 g/dL (ref 3.5–5.2)
Alkaline Phosphatase: 130 U/L — ABNORMAL HIGH (ref 39–117)
BUN: 14 mg/dL (ref 6–23)
CO2: 29 mEq/L (ref 19–32)
Calcium: 9.2 mg/dL (ref 8.4–10.5)
Chloride: 100 mEq/L (ref 96–112)
Creatinine, Ser: 0.8 mg/dL (ref 0.40–1.50)
GFR: 97.32 mL/min (ref >60.00)
Glucose, Bld: 86 mg/dL (ref 70–99)
Potassium: 4 mEq/L (ref 3.5–5.1)
Sodium: 136 mEq/L (ref 135–145)
Total Bilirubin: 0.7 mg/dL (ref 0.2–1.2)
Total Protein: 6.7 g/dL (ref 6.0–8.3)

## 2019-11-12 LAB — GAMMA GT: GGT: 31 U/L (ref 7–51)

## 2019-11-12 LAB — APTT: aPTT: 30.5 s (ref 23.4–32.7)

## 2019-11-12 LAB — VITAMIN D 25 HYDROXY (VIT D DEFICIENCY, FRACTURES): VITD: 26.1 ng/mL — ABNORMAL LOW (ref 30.00–100.00)

## 2019-11-12 LAB — PROTIME-INR
INR: 1.1 ratio — ABNORMAL HIGH (ref 0.8–1.0)
Prothrombin Time: 12.6 s (ref 9.6–13.1)

## 2019-11-12 MED ORDER — OMEPRAZOLE 20 MG PO CPDR: 20.0000 mg | DELAYED_RELEASE_CAPSULE | Freq: Two times a day (BID) | ORAL | 1 refills | Status: DC

## 2019-11-12 NOTE — Telephone Encounter (Signed)
Pt's mom called in asking for a 90 days supply of the Omeprazole to be sent in to the [facility] on [address].  Please advise

## 2019-11-12 NOTE — Telephone Encounter (Signed)
Rx sent to the preferred patient pharmacy  

## 2019-11-18 ENCOUNTER — Other Ambulatory Visit: Payer: Self-pay

## 2019-11-18 MED ORDER — VITAMIN D (ERGOCALCIFEROL) 1.25 MG (50000 UNIT) PO CAPS: 50000.0000 [IU] | ORAL_CAPSULE | ORAL | 0 refills | Status: DC

## 2019-11-22 ENCOUNTER — Encounter: Payer: Self-pay | Admitting: Emergency Medicine

## 2019-12-10 ENCOUNTER — Other Ambulatory Visit: Payer: Self-pay

## 2019-12-10 ENCOUNTER — Encounter: Payer: Self-pay | Admitting: Physician Assistant

## 2019-12-10 ENCOUNTER — Ambulatory Visit (INDEPENDENT_AMBULATORY_CARE_PROVIDER_SITE_OTHER): Payer: Medicare HMO | Admitting: Physician Assistant

## 2019-12-10 VITALS — BP 120/70 | HR 70 | Temp 98.0°F | Resp 16 | Ht 69.0 in | Wt 133.0 lb

## 2019-12-10 DIAGNOSIS — G894 Chronic pain syndrome: Secondary | ICD-10-CM | POA: Diagnosis not present

## 2019-12-10 DIAGNOSIS — H1033 Unspecified acute conjunctivitis, bilateral: Secondary | ICD-10-CM | POA: Diagnosis not present

## 2019-12-10 MED ORDER — POLYMYXIN B-TRIMETHOPRIM 10000-0.1 UNIT/ML-% OP SOLN: OPHTHALMIC | 0 refills | Status: DC

## 2019-12-10 MED ORDER — DULOXETINE HCL 30 MG PO CPEP: 30.0000 mg | ORAL_CAPSULE | Freq: Every day | ORAL | 3 refills | Status: DC

## 2019-12-10 NOTE — Patient Instructions (Signed)
Please stop the Fluoxetine (Prozac) and start the Duloxetine (Cymbalta) as directed for mood and pain.   Also use the eye drops as directed. Avoid rubbing the eyes.  Continue the warm compresses. Please let me know if things are not resolving, anything worsens or new symptoms develop.

## 2019-12-10 NOTE — Progress Notes (Signed)
Patient presents to clinic today with his mother to follow-up regarding chronic pain. Also has acute concern today.  At last visit, patient was started on a trial of Lyrica 75 mg twice daily to help with chronic nerve pain.  Endorses taking medication as directed for about a week before stopping.  States it caused him to feel very dizzy and off-balance and even caused some mild chest discomfort.  Notes complete resolution of the symptoms after stopping medication.  Would like to discuss other options for treatment.  Patient with 3 days of redness of eyes bilaterally with itching, burning and morning drainage/crusting.  Notes this started after he got some dust in his eyes.  Notes immediately flushing his eyes out with water but still having a "sandpaper" sensation in his eyes.  Denies vision changes.  Denies trauma to the eyes.  Past Medical History:  Diagnosis Date  . Allergy   . Anginal pain ([place])   . Anxiety   . Arthritis   . Bradycardia   . CHF (congestive heart failure) ([place])   . Chronic chest pain   . COPD (chronic obstructive pulmonary disease) ([place])   . Depression   . Emphysema of lung ([place])   . GERD (gastroesophageal reflux disease)   . History of nuclear stress test 06/14/2011   lexiscan; mild perfusion defect in basal inferosetpal & apical inferior region; negative for ischemia   . Hypothyroidism   . Pacemaker   . Pneumonia   . 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 Seizure disorder ([place])   . Seizures ([place])    as a baby  . Shortness of breath   . Tobacco abuse     Current Outpatient Medications on File Prior to Visit  Medication Sig Dispense Refill  . albuterol (ACCUNEB) 1.25 MG/3ML nebulizer solution USE 1 VIAL IN NEBULIZER EVERY 6 HOURS AS NEEDED FOR WHEEZING 150 mL 0  . budesonide-formoterol (SYMBICORT) 160-4.5 MCG/ACT inhaler budesonide-formoterol HFA 160 mcg-4.5 mcg/actuation aerosol inhaler  INHALE 2 PUFFS FIRST THING IN THE  MORNING AND THEN ANOTHER 2 PUFFS ABOUT 12 HOURS LATER    . fluticasone (FLONASE) 50 MCG/ACT nasal spray Place 2 sprays into both nostrils as needed.    . Multiple Vitamin (MULTIVITAMIN WITH MINERALS) TABS tablet Take 1 tablet by mouth daily.    [name] omeprazole (PRILOSEC) 20 MG capsule Take 1 capsule (20 mg total) by mouth 2 (two) times daily. 180 capsule 1  . Tiotropium Bromide Monohydrate (SPIRIVA RESPIMAT) 2.5 MCG/ACT AERS Inhale 2 puffs into the lungs daily. 3 each 3  . VENTOLIN HFA 108 (90 Base) MCG/ACT inhaler Up to 2 pffs every 4 hours as needed 3 each 3  . Vitamin D, Ergocalciferol, (DRISDOL) 1.25 MG (50000 UNIT) CAPS capsule Take 1 capsule (50,000 Units total) by mouth every 7 (seven) days. 10 capsule 0   No current facility-administered medications on file prior to visit.    Allergies  Allergen Reactions  . Shellfish Allergy Anaphylaxis    Family History  Problem Relation Age of Onset  . Heart Problems Mother        MVP  . Heart disease Mother   . CAD Father   . COPD Father   . Heart disease Father   . Heart Problems Other        aunts x 2 died of heart problems   . Heart attack Maternal Grandmother   . Hypertension Maternal Grandmother   . Heart attack Paternal  Grandmother   . Hypertension Paternal Grandmother   . Heart attack Paternal Grandfather   . Hypertension Paternal Grandfather   . Other Sister        crib dealth    Social History   Socioeconomic History  . Marital status: Divorced    Spouse name: Not on file  . Number of children: 2  . Years of education: 61  . Highest education level: Not on file  Occupational History  . Occupation: Disabled  Tobacco Use  . Smoking status: Current Every Day Smoker    Packs/day: 1.00    Years: 40.00    Pack years: 40.00    Types: Cigarettes  . Smokeless tobacco: Never Used  Vaping Use  . Vaping Use: Never used  Substance and Sexual Activity  . Alcohol use: No  . Drug use: Never  . Sexual activity: Not Currently   Other Topics Concern  . Not on file  Social History Narrative  . Not on file   Social Determinants of Health   Financial Resource Strain:   . Difficulty of Paying Living Expenses: Not on file  Food Insecurity:   . Worried About Charity fundraiser in the Last Year: Not on file  . Ran Out of Food in the Last Year: Not on file  Transportation Needs:   . Lack of Transportation (Medical): Not on file  . Lack of Transportation (Non-Medical): Not on file  Physical Activity:   . Days of Exercise per Week: Not on file  . Minutes of Exercise per Session: Not on file  Stress:   . Feeling of Stress : Not on file  Social Connections:   . Frequency of Communication with Friends and Family: Not on file  . Frequency of Social Gatherings with Friends and Family: Not on file  . Attends Religious Services: Not on file  . Active Member of Clubs or Organizations: Not on file  . Attends Archivist Meetings: Not on file  . Marital Status: Not on file    Review of Systems - See HPI.  All other ROS are negative.  BP 120/70   Pulse 70   Temp 98 F (36.7 C) (Temporal)   Resp 16   Ht '5\' 9"'  (1.753 m)   Wt 133 lb (60.3 kg)   SpO2 98%   BMI 19.64 kg/m   Physical Exam  Recent Results (from the past 2160 hour(s))  CUP PACEART REMOTE DEVICE CHECK     Status: None   Collection Time: 10/08/19  4:16 PM  Result Value Ref Range   Date Time Interrogation Session [id number]    Pulse Generator Manufacturer MERM    Pulse Gen Model ADDRL1 Adapta    Pulse Gen Serial Number [id number] [facility] Name [place] Pulse Generator Type Implantable Pulse Generator    Implantable Pulse Generator Implant Date [id number]    Implantable Lead Manufacturer [facility]    Implantable Lead Model 5076 CapSureFix Novus    Implantable Lead Serial Number [id number]    Implantable Lead Implant Date 77939030    Implantable Lead Location Detail 1 APEX    Implantable Lead Special Function Q     Implantable Lead Location [id number]    Implantable Lead Manufacturer [facility]    Implantable Lead Model 5076 CapSureFix Novus    Implantable Lead Serial Number [id number]    Implantable Lead Implant Date 22633354    Implantable Lead Location Detail 1 APPENDAGE    Implantable Lead  Location [id number]    Lead Channel Setting Sensing Sensitivity 5.60 mV   Lead Channel Setting Pacing Amplitude 1.500 V   Lead Channel Setting Pacing Pulse Width 0.40 ms   Lead Channel Setting Pacing Amplitude 2.250 V   Lead Channel Impedance Value 472 ohm   Lead Channel Pacing Threshold Amplitude 0.625 V   Lead Channel Pacing Threshold Pulse Width 0.40 ms   Lead Channel Impedance Value 531 ohm   Lead Channel Pacing Threshold Amplitude 1.125 V   Lead Channel Pacing Threshold Pulse Width 0.40 ms   Battery Status OK    Battery Remaining Longevity 103 mo   Battery Voltage 2.77 V   Battery Impedance 501 ohm   Brady Statistic AP VP Percent 0 %   Brady Statistic AS VP Percent 1 %   Brady Statistic AP VS Percent 40 %   Brady Statistic AS VS Percent 59 %  CBC w/Diff     Status: None   Collection Time: 11/11/19  2:56 PM  Result Value Ref Range   WBC 7.2 4.0 - 10.5 K/uL   RBC 4.45 4.22 - 5.81 Mil/uL   Hemoglobin 15.0 13.0 - 17.0 g/dL   HCT 43.8 39 - 52 %   MCV 98.4 78.0 - 100.0 fl   MCHC 34.3 30.0 - 36.0 g/dL   RDW 13.2 11.5 - 15.5 %   Platelets 306.0 150 - 400 K/uL   Neutrophils Relative % 70.7 43 - 77 %   Lymphocytes Relative 18.9 12 - 46 %   Monocytes Relative 7.0 3 - 12 %   Eosinophils Relative 2.4 0 - 5 %   Basophils Relative 1.0 0 - 3 %   Neutro Abs 5.1 1.4 - 7.7 K/uL   Lymphs Abs 1.4 0.7 - 4.0 K/uL   Monocytes Absolute 0.5 0.1 - 1.0 K/uL   Eosinophils Absolute 0.2 0.0 - 0.7 K/uL   Basophils Absolute 0.1 0.0 - 0.1 K/uL  Protime-INR ( [facility] ONLY)     Status: Abnormal   Collection Time: 11/11/19  2:56 PM  Result Value Ref Range   INR 1.1 (H) 0.8 - 1.0 ratio   Prothrombin Time 12.6 9.6 - 13.1 sec  Comp Met  (CMET)     Status: Abnormal   Collection Time: 11/11/19  2:56 PM  Result Value Ref Range   Sodium 136 135 - 145 mEq/L   Potassium 4.0 3.5 - 5.1 mEq/L   Chloride 100 96 - 112 mEq/L   CO2 29 19 - 32 mEq/L   Glucose, Bld 86 70 - 99 mg/dL   BUN 14 6 - 23 mg/dL   Creatinine, Ser 0.80 0.40 - 1.50 mg/dL   Total Bilirubin 0.7 0.2 - 1.2 mg/dL   Alkaline Phosphatase 130 (H) 39 - 117 U/L   AST 13 0 - 37 U/L   ALT 9 0 - 53 U/L   Total Protein 6.7 6.0 - 8.3 g/dL   Albumin 4.3 3.5 - 5.2 g/dL   GFR 97.32 >60.00 mL/min    Comment: Calculated using the CKD-EPI Creatinine Equation (2021)   Calcium 9.2 8.4 - 10.5 mg/dL  PTT     Status: None   Collection Time: 11/11/19  2:56 PM  Result Value Ref Range   aPTT 30.5 23.4 - 32.7 SEC  VITAMIN D 25 Hydroxy (Vit-D Deficiency, Fractures)     Status: Abnormal   Collection Time: 11/12/19 12:13 PM  Result Value Ref Range   VITD 26.10 (L) 30.00 - 100.00 ng/mL  Gamma GT  Status: None   Collection Time: 11/12/19 12:13 PM  Result Value Ref Range   GGT 31 7 - 51 U/L    Assessment/Plan: 1. Acute bacterial conjunctivitis of both eyes Feel this started out as more of an irritant/allergic conjunctivitis due to frequent rubbing now having signs/symptoms of bacterial involvement.  Will start Polytrim OP.  Supportive measures and OTC medications reviewed.  Continue warm compresses.  Discussed signs and symptoms that would warrant ophthalmology evaluation.  Patient and mother voiced understanding and agreement with plan.  2. Chronic pain syndrome Patient again seems to be adamant on being placed back on oxycodone.  Discussed again that we do not give narcotic medications in this office and can refer him to pain management if he would like.  He is agreeable to other options.  Discussed potential of switching his fluoxetine to duloxetine which will continue to help with mood but also has indication for chronic neuropathic and msk pain. He would like to try this. Rx  duloxetine 30 mg daily.  Close follow-up scheduled.  This visit occurred during the SARS-CoV-2 public health emergency.  Safety protocols were in place, including screening questions prior to the visit, additional usage of staff PPE, and extensive cleaning of exam room while observing appropriate contact time as indicated for disinfecting solutions.     Leeanne Rio, PA-C

## 2020-01-02 ENCOUNTER — Telehealth: Payer: Self-pay | Admitting: Physician Assistant

## 2020-01-02 NOTE — Telephone Encounter (Signed)
Left message for patient to schedule Annual Wellness Visit.  Please schedule with Nurse Health Advisor Martha Stanley, RN at [facility]  

## 2020-01-06 ENCOUNTER — Ambulatory Visit (INDEPENDENT_AMBULATORY_CARE_PROVIDER_SITE_OTHER): Payer: Medicare HMO

## 2020-01-06 DIAGNOSIS — I495 Sick sinus syndrome: Secondary | ICD-10-CM | POA: Diagnosis not present

## 2020-01-07 LAB — CUP PACEART REMOTE DEVICE CHECK
Battery Impedance: 551 Ohm
Battery Remaining Longevity: 99 mo
Battery Voltage: 2.78 V
Brady Statistic AP VP Percent: 0 %
Brady Statistic AP VS Percent: 42 %
Brady Statistic AS VP Percent: 1 %
Brady Statistic AS VS Percent: 57 %
Date Time Interrogation Session: 20220103122252
Implantable Lead Implant Date: 20050421
Implantable Lead Implant Date: 20050421
Implantable Lead Location: 753859
Implantable Lead Location: 753860
Implantable Lead Model: 5076
Implantable Lead Model: 5076
Implantable Pulse Generator Implant Date: 20140904
Lead Channel Impedance Value: 422 Ohm
Lead Channel Impedance Value: 630 Ohm
Lead Channel Pacing Threshold Amplitude: 0.75 V
Lead Channel Pacing Threshold Amplitude: 1 V
Lead Channel Pacing Threshold Pulse Width: 0.4 ms
Lead Channel Pacing Threshold Pulse Width: 0.4 ms
Lead Channel Setting Pacing Amplitude: 1.5 V
Lead Channel Setting Pacing Amplitude: 2 V
Lead Channel Setting Pacing Pulse Width: 0.4 ms
Lead Channel Setting Sensing Sensitivity: 5.6 mV

## 2020-01-21 NOTE — Progress Notes (Signed)
Remote pacemaker transmission.   

## 2020-01-27 ENCOUNTER — Other Ambulatory Visit: Payer: Self-pay

## 2020-01-27 ENCOUNTER — Ambulatory Visit (INDEPENDENT_AMBULATORY_CARE_PROVIDER_SITE_OTHER): Payer: Medicare HMO | Admitting: Physician Assistant

## 2020-01-27 ENCOUNTER — Encounter: Payer: Self-pay | Admitting: Physician Assistant

## 2020-01-27 VITALS — BP 160/98 | HR 62 | Temp 98.1°F | Resp 16 | Ht 69.0 in | Wt 139.0 lb

## 2020-01-27 DIAGNOSIS — I10 Essential (primary) hypertension: Secondary | ICD-10-CM | POA: Diagnosis not present

## 2020-01-27 MED ORDER — LOSARTAN POTASSIUM-HCTZ 100-25 MG PO TABS: 1.0000 | ORAL_TABLET | Freq: Every day | ORAL | 1 refills | Status: DC

## 2020-01-27 NOTE — Progress Notes (Signed)
Patient presents to clinic today with his mother c/o episodes of significantly elevated blood pressure running up to 180/101 when checked yesterday by his niece.  Has having headaches and some occasional episodes of dizziness.  Has pacemaker in place with recent interrogation without concerning findings.  Notes does not eat the best, favoring fried and processed foods.  Notes he does try to keep well-hydrated but his mother disagrees with this assessment.  Denies any shortness of breath or chest pain.  Has never had noted elevated blood pressure at prior visits with [place].    Past Medical History:  Diagnosis Date  . Allergy   . Anginal pain ([place])   . Anxiety   . Arthritis   . Bradycardia   . CHF (congestive heart failure) ([place])   . Chronic chest pain   . COPD (chronic obstructive pulmonary disease) ([place])   . Depression   . Emphysema of lung ([place])   . GERD (gastroesophageal reflux disease)   . History of nuclear stress test 06/14/2011   lexiscan; mild perfusion defect in basal inferosetpal & apical inferior region; negative for ischemia   . Hypothyroidism   . Pacemaker   . Pneumonia   . 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 Seizure disorder ([place])   . Seizures ([place])    as a baby  . Shortness of breath   . Tobacco abuse     Current Outpatient Medications on File Prior to Visit  Medication Sig Dispense Refill  . albuterol (ACCUNEB) 1.25 MG/3ML nebulizer solution USE 1 VIAL IN NEBULIZER EVERY 6 HOURS AS NEEDED FOR WHEEZING 150 mL 0  . budesonide-formoterol (SYMBICORT) 160-4.5 MCG/ACT inhaler budesonide-formoterol HFA 160 mcg-4.5 mcg/actuation aerosol inhaler  INHALE 2 PUFFS FIRST THING IN THE MORNING AND THEN ANOTHER 2 PUFFS ABOUT 12 HOURS LATER    . DULoxetine (CYMBALTA) 30 MG capsule Take 1 capsule (30 mg total) by mouth daily. 30 capsule 3  . fluticasone (FLONASE) 50 MCG/ACT nasal spray Place 2 sprays into both nostrils as needed.    . Multiple  Vitamin (MULTIVITAMIN WITH MINERALS) TABS tablet Take 1 tablet by mouth daily.    [name] omeprazole (PRILOSEC) 20 MG capsule Take 1 capsule (20 mg total) by mouth 2 (two) times daily. 180 capsule 1  . Tiotropium Bromide Monohydrate (SPIRIVA RESPIMAT) 2.5 MCG/ACT AERS Inhale 2 puffs into the lungs daily. 3 each 3  . trimethoprim-polymyxin b (POLYTRIM) ophthalmic solution Apply 1-2 drops into affected eye QID x 5 days. 10 mL 0  . VENTOLIN HFA 108 (90 Base) MCG/ACT inhaler Up to 2 pffs every 4 hours as needed 3 each 3  . Vitamin D, Ergocalciferol, (DRISDOL) 1.25 MG (50000 UNIT) CAPS capsule Take 1 capsule (50,000 Units total) by mouth every 7 (seven) days. 10 capsule 0   No current facility-administered medications on file prior to visit.    Allergies  Allergen Reactions  . Shellfish Allergy Anaphylaxis    Family History  Problem Relation Age of Onset  . Heart Problems Mother        MVP  . Heart disease Mother   . CAD Father   . COPD Father   . Heart disease Father   . Heart Problems Other        aunts x 2 died of heart problems   . Heart attack Maternal Grandmother   . Hypertension Maternal Grandmother   . Heart attack Paternal Grandmother   . Hypertension Paternal Grandmother   .  Heart attack Paternal Grandfather   . Hypertension Paternal Grandfather   . Other Sister        crib dealth    Social History   Socioeconomic History  . Marital status: Divorced    Spouse name: Not on file  . Number of children: 2  . Years of education: 85  . Highest education level: Not on file  Occupational History  . Occupation: Disabled  Tobacco Use  . Smoking status: Current Every Day Smoker    Packs/day: 1.00    Years: 40.00    Pack years: 40.00    Types: Cigarettes  . Smokeless tobacco: Never Used  Vaping Use  . Vaping Use: Never used  Substance and Sexual Activity  . Alcohol use: No  . Drug use: Never  . Sexual activity: Not Currently  Other Topics Concern  . Not on file  Social  History Narrative  . Not on file   Social Determinants of Health   Financial Resource Strain: Not on file  Food Insecurity: Not on file  Transportation Needs: Not on file  Physical Activity: Not on file  Stress: Not on file  Social Connections: Not on file   Review of Systems - See HPI.  All other ROS are negative.  BP (!) 160/98   Pulse 62   Temp 98.1 F (36.7 C) (Temporal)   Resp 16   Ht _0  (1.753 m)   Wt 139 lb (63 kg)   SpO2 96%   BMI 20.53 kg/m   Physical Exam Vitals reviewed.  Constitutional:      Appearance: Normal appearance.  Cardiovascular:     Rate and Rhythm: Normal rate and regular rhythm.     Pulses: Normal pulses.     Heart sounds: Normal heart sounds.  Pulmonary:     Effort: Pulmonary effort is normal.     Breath sounds: Normal breath sounds.  Musculoskeletal:     Cervical back: Neck supple.  Neurological:     General: No focal deficit present.     Mental Status: He is alert.  Psychiatric:        Mood and Affect: Mood normal.     Recent Results (from the past 2160 hour(s))  CBC w/Diff     Status: None   Collection Time: 11/11/19  2:56 PM  Result Value Ref Range   WBC 7.2 4.0 - 10.5 K/uL   RBC 4.45 4.22 - 5.81 Mil/uL   Hemoglobin 15.0 13.0 - 17.0 g/dL   HCT 43.8 39.0 - 52.0 %   MCV 98.4 78.0 - 100.0 fl   MCHC 34.3 30.0 - 36.0 g/dL   RDW 13.2 11.5 - 15.5 %   Platelets 306.0 150.0 - 400.0 K/uL   Neutrophils Relative % 70.7 43.0 - 77.0 %   Lymphocytes Relative 18.9 12.0 - 46.0 %   Monocytes Relative 7.0 3.0 - 12.0 %   Eosinophils Relative 2.4 0.0 - 5.0 %   Basophils Relative 1.0 0.0 - 3.0 %   Neutro Abs 5.1 1.4 - 7.7 K/uL   Lymphs Abs 1.4 0.7 - 4.0 K/uL   Monocytes Absolute 0.5 0.1 - 1.0 K/uL   Eosinophils Absolute 0.2 0.0 - 0.7 K/uL   Basophils Absolute 0.1 0.0 - 0.1 K/uL  Protime-INR ( [facility] ONLY)     Status: Abnormal   Collection Time: 11/11/19  2:56 PM  Result Value Ref Range   INR 1.1 (H) 0.8 - 1.0 ratio   Prothrombin Time  12.6 9.6 - 13.1  sec  Comp Met (CMET)     Status: Abnormal   Collection Time: 11/11/19  2:56 PM  Result Value Ref Range   Sodium 136 135 - 145 mEq/L   Potassium 4.0 3.5 - 5.1 mEq/L   Chloride 100 96 - 112 mEq/L   CO2 29 19 - 32 mEq/L   Glucose, Bld 86 70 - 99 mg/dL   BUN 14 6 - 23 mg/dL   Creatinine, Ser 0.80 0.40 - 1.50 mg/dL   Total Bilirubin 0.7 0.2 - 1.2 mg/dL   Alkaline Phosphatase 130 (H) 39 - 117 U/L   AST 13 0 - 37 U/L   ALT 9 0 - 53 U/L   Total Protein 6.7 6.0 - 8.3 g/dL   Albumin 4.3 3.5 - 5.2 g/dL   GFR 97.32 >60.00 mL/min    Comment: Calculated using the CKD-EPI Creatinine Equation (2021)   Calcium 9.2 8.4 - 10.5 mg/dL  PTT     Status: None   Collection Time: 11/11/19  2:56 PM  Result Value Ref Range   aPTT 30.5 23.4 - 32.7 SEC  VITAMIN D 25 Hydroxy (Vit-D Deficiency, Fractures)     Status: Abnormal   Collection Time: 11/12/19 12:13 PM  Result Value Ref Range   VITD 26.10 (L) 30.00 - 100.00 ng/mL  Gamma GT     Status: None   Collection Time: 11/12/19 12:13 PM  Result Value Ref Range   GGT 31 7 - 51 U/L  CUP PACEART REMOTE DEVICE CHECK     Status: None   Collection Time: 01/06/20 12:22 PM  Result Value Ref Range   Date Time Interrogation Session [id number]    Pulse Generator Manufacturer MERM    Pulse Gen Model ADDRL1 Adapta    Pulse Gen Serial Number [id number]    Clinic [facility]    Implantable Pulse Generator Type Implantable Pulse Generator    Implantable Pulse Generator Implant Date 06301601    Implantable Lead Manufacturer MERM    Implantable Lead Model 5076 CapSureFix Novus    Implantable Lead Serial Number [id number]    Implantable Lead Implant Date 22025427    Implantable Lead Location Detail 1 APEX    Implantable Lead Special Function Q    Implantable Lead Location [id number]    Implantable Lead Manufacturer MERM    Implantable Lead Model 5076 CapSureFix Novus    Implantable Lead Serial Number [id number]    Implantable Lead Implant  Date 06237628    Implantable Lead Location Detail 1 APPENDAGE    Implantable Lead Location [id number]    Lead Channel Setting Sensing Sensitivity 5.60 mV   Lead Channel Setting Pacing Amplitude 1.500 V   Lead Channel Setting Pacing Pulse Width 0.40 ms   Lead Channel Setting Pacing Amplitude 2.000 V   Lead Channel Impedance Value 422 ohm   Lead Channel Pacing Threshold Amplitude 0.750 V   Lead Channel Pacing Threshold Pulse Width 0.40 ms   Lead Channel Impedance Value 630 ohm   Lead Channel Pacing Threshold Amplitude 1.000 V   Lead Channel Pacing Threshold Pulse Width 0.40 ms   Battery Status OK    Battery Remaining Longevity 99 mo   Battery Voltage 2.78 V   Battery Impedance 551 ohm   Brady Statistic AP VP Percent 0 %   Brady Statistic AS VP Percent 1 %   Brady Statistic AP VS Percent 42 %   Brady Statistic AS VS Percent 57 %  CBC with Differential/Platelet     Status: None  Collection Time: 01/27/20  2:53 PM  Result Value Ref Range   WBC 8.8 4.0 - 10.5 K/uL   RBC 4.23 4.22 - 5.81 Mil/uL   Hemoglobin 14.3 13.0 - 17.0 g/dL   HCT 41.7 39.0 - 52.0 %   MCV 98.5 78.0 - 100.0 fl   MCHC 34.2 30.0 - 36.0 g/dL   RDW 13.0 11.5 - 15.5 %   Platelets 281.0 150.0 - 400.0 K/uL   Neutrophils Relative % 69.3 43.0 - 77.0 %   Lymphocytes Relative 20.8 12.0 - 46.0 %   Monocytes Relative 6.7 3.0 - 12.0 %   Eosinophils Relative 1.9 0.0 - 5.0 %   Basophils Relative 1.3 0.0 - 3.0 %   Neutro Abs 6.1 1.4 - 7.7 K/uL   Lymphs Abs 1.8 0.7 - 4.0 K/uL   Monocytes Absolute 0.6 0.1 - 1.0 K/uL   Eosinophils Absolute 0.2 0.0 - 0.7 K/uL   Basophils Absolute 0.1 0.0 - 0.1 K/uL  Comprehensive metabolic panel     Status: Abnormal   Collection Time: 01/27/20  2:53 PM  Result Value Ref Range   Sodium 137 135 - 145 mEq/L   Potassium 4.5 3.5 - 5.1 mEq/L   Chloride 102 96 - 112 mEq/L   CO2 32 19 - 32 mEq/L   Glucose, Bld 69 (L) 70 - 99 mg/dL   BUN 17 6 - 23 mg/dL   Creatinine, Ser 1.07 0.40 - 1.50 mg/dL   Total  Bilirubin 0.3 0.2 - 1.2 mg/dL   Alkaline Phosphatase 112 39 - 117 U/L   AST 13 0 - 37 U/L   ALT 8 0 - 53 U/L   Total Protein 7.1 6.0 - 8.3 g/dL   Albumin 4.3 3.5 - 5.2 g/dL   GFR 76.19 >60.00 mL/min    Comment: Calculated using the CKD-EPI Creatinine Equation (2021)   Calcium 9.3 8.4 - 10.5 mg/dL  TSH     Status: Abnormal   Collection Time: 01/27/20  2:53 PM  Result Value Ref Range   TSH 0.34 (L) 0.35 - 4.50 uIU/mL  T4, free     Status: None   Collection Time: 01/28/20  3:49 PM  Result Value Ref Range   Free T4 0.87 0.60 - 1.60 ng/dL    Comment: Specimens from patients who are undergoing biotin therapy and /or ingesting biotin supplements may contain high levels of biotin.  The higher biotin concentration in these specimens interferes with this Free T4 assay.  Specimens that contain high levels  of biotin may cause false high results for this Free T4 assay.  Please interpret results in light of the total clinical presentation of the patient.    T3, free     Status: None   Collection Time: 01/28/20  3:49 PM  Result Value Ref Range   T3, Free 3.3 2.3 - 4.2 pg/mL    Assessment/Plan: 1. Hypertension, unspecified type With some headaches and dizziness.  None present currently.  EKG today with normal sinus rhythm.  Will check labs today to further assess.  DASH diet reviewed with patient and mother.  Will start treatment with losartan hydrochlorothiazide given substantial elevation of blood pressure.  Close follow-up discussed.  Patient also noted to schedule follow-up with his cardiologist.  They endorsed they will make this appointment as soon as the visit is over. - EKG 12-Lead - CBC with Differential/Platelet - Comprehensive metabolic panel - TSH  This visit occurred during the SARS-CoV-2 public health emergency.  Safety protocols were in place,  including screening questions prior to the visit, additional usage of staff PPE, and extensive cleaning of exam room while observing  appropriate contact time as indicated for disinfecting solutions.     Leeanne Rio, PA-C

## 2020-01-27 NOTE — Patient Instructions (Signed)
Please go to the lab today for blood work.  I will call you with your results. We will alter treatment regimen(s) if indicated by your results.   Please start the losartan as directed once daily. Follow the dietary recommendations below. Please consider letting us help you stop smoking.  Call Dr. Salena Saner to schedule a follow-up appointment ASAP for further evaluation.   If you have another episode of losing consciousness, you need evaluation in the ER.   [email or web address]">  DASH Eating Plan DASH stands for Dietary Approaches to Stop Hypertension. The DASH eating plan is a healthy eating plan that has been shown to:  Reduce high blood pressure (hypertension).  Reduce your risk for type 2 diabetes, heart disease, and stroke.  Help with weight loss. What are tips for following this plan? Reading food labels  Check food labels for the amount of salt (sodium) per serving. Choose foods with less than 5 percent of the Daily Value of sodium. Generally, foods with less than 300 milligrams (mg) of sodium per serving fit into this eating plan.  To find whole grains, look for the word "whole" as the first word in the ingredient list. Shopping  Buy products labeled as "low-sodium" or "no salt added."  Buy fresh foods. Avoid canned foods and pre-made or frozen meals. Cooking  Avoid adding salt when cooking. Use salt-free seasonings or herbs instead of table salt or sea salt. Check with your health care provider or pharmacist before using salt substitutes.  Do not fry foods. Cook foods using healthy methods such as baking, boiling, grilling, roasting, and broiling instead.  Cook with heart-healthy oils, such as olive, canola, avocado, soybean, or sunflower oil. Meal planning  Eat a balanced diet that includes: ? 4 or more servings of fruits and 4 or more servings of vegetables each day. Try to fill one-half of your plate with fruits and  vegetables. ? 6-8 servings of whole grains each day. ? Less than 6 oz (170 g) of lean meat, poultry, or fish each day. A 3-oz (85-g) serving of meat is about the same size as a deck of cards. One egg equals 1 oz (28 g). ? 2-3 servings of low-fat dairy each day. One serving is 1 cup (237 mL). ? 1 serving of nuts, seeds, or beans 5 times each week. ? 2-3 servings of heart-healthy fats. Healthy fats called omega-3 fatty acids are found in foods such as walnuts, flaxseeds, fortified milks, and eggs. These fats are also found in cold-water fish, such as sardines, salmon, and mackerel.  Limit how much you eat of: ? Canned or prepackaged foods. ? Food that is high in trans fat, such as some fried foods. ? Food that is high in saturated fat, such as fatty meat. ? Desserts and other sweets, sugary drinks, and other foods with added sugar. ? Full-fat dairy products.  Do not salt foods before eating.  Do not eat more than 4 egg yolks a week.  Try to eat at least 2 vegetarian meals a week.  Eat more home-cooked food and less restaurant, buffet, and fast food.   Lifestyle  When eating at a restaurant, ask that your food be prepared with less salt or no salt, if possible.  If you drink alcohol: ? Limit how much you use to:  0-1 drink a day for women who are not pregnant.  0-2 drinks a day for men. ? Be aware of how much alcohol is in your drink. In the [place] one drink  equals one 12 oz bottle of beer (355 mL), one 5 oz glass of wine (148 mL), or one 1 oz glass of hard liquor (44 mL). General information  Avoid eating more than 2,300 mg of salt a day. If you have hypertension, you may need to reduce your sodium intake to 1,500 mg a day.  Work with your health care provider to maintain a healthy body weight or to lose weight. Ask what an ideal weight is for you.  Get at least 30 minutes of exercise that causes your heart to beat faster (aerobic exercise) most days of the week. Activities may  include walking, swimming, or biking.  Work with your health care provider or dietitian to adjust your eating plan to your individual calorie needs. What foods should I eat? Fruits All fresh, dried, or frozen fruit. Canned fruit in natural juice (without added sugar). Vegetables Fresh or frozen vegetables (raw, steamed, roasted, or grilled). Low-sodium or reduced-sodium tomato and vegetable juice. Low-sodium or reduced-sodium tomato sauce and tomato paste. Low-sodium or reduced-sodium canned vegetables. Grains Whole-grain or whole-wheat bread. Whole-grain or whole-wheat pasta. Brown rice. Orpah Cobb. Bulgur. Whole-grain and low-sodium cereals. Pita bread. Low-fat, low-sodium crackers. Whole-wheat flour tortillas. Meats and other proteins Skinless chicken or [place]. Ground chicken or [place]. Pork with fat trimmed off. Fish and seafood. Egg whites. Dried beans, peas, or lentils. Unsalted nuts, nut butters, and seeds. Unsalted canned beans. Lean cuts of beef with fat trimmed off. Low-sodium, lean precooked or cured meat, such as sausages or meat loaves. Dairy Low-fat (1%) or fat-free (skim) milk. Reduced-fat, low-fat, or fat-free cheeses. Nonfat, low-sodium ricotta or cottage cheese. Low-fat or nonfat yogurt. Low-fat, low-sodium cheese. Fats and oils Soft margarine without trans fats. Vegetable oil. Reduced-fat, low-fat, or light mayonnaise and salad dressings (reduced-sodium). Canola, safflower, olive, avocado, soybean, and sunflower oils. Avocado. Seasonings and condiments Herbs. Spices. Seasoning mixes without salt. Other foods Unsalted popcorn and pretzels. Fat-free sweets. The items listed above may not be a complete list of foods and beverages you can eat. Contact a dietitian for more information. What foods should I avoid? Fruits Canned fruit in a light or heavy syrup. Fried fruit. Fruit in cream or butter sauce. Vegetables Creamed or fried vegetables. Vegetables in a cheese sauce.  Regular canned vegetables (not low-sodium or reduced-sodium). Regular canned tomato sauce and paste (not low-sodium or reduced-sodium). Regular tomato and vegetable juice (not low-sodium or reduced-sodium). Rosita Fire. Olives. Grains Baked goods made with fat, such as croissants, muffins, or some breads. Dry pasta or rice meal packs. Meats and other proteins Fatty cuts of meat. Ribs. Fried meat. Tomasa Blase. Bologna, salami, and other precooked or cured meats, such as sausages or meat loaves. Fat from the back of a pig (fatback). Bratwurst. Salted nuts and seeds. Canned beans with added salt. Canned or smoked fish. Whole eggs or egg yolks. Chicken or [place] with skin. Dairy Whole or 2% milk, cream, and half-and-half. Whole or full-fat cream cheese. Whole-fat or sweetened yogurt. Full-fat cheese. Nondairy creamers. Whipped toppings. Processed cheese and cheese spreads. Fats and oils Butter. Stick margarine. Lard. Shortening. Ghee. Bacon fat. Tropical oils, such as coconut, palm kernel, or palm oil. Seasonings and condiments Onion salt, garlic salt, seasoned salt, table salt, and sea salt. Worcestershire sauce. Tartar sauce. Barbecue sauce. Teriyaki sauce. Soy sauce, including reduced-sodium. Steak sauce. Canned and packaged gravies. Fish sauce. Oyster sauce. Cocktail sauce. Store-bought horseradish. Ketchup. Mustard. Meat flavorings and tenderizers. Bouillon cubes. Hot sauces. Pre-made or packaged marinades. Pre-made or packaged  taco seasonings. Relishes. Regular salad dressings. Other foods Salted popcorn and pretzels. The items listed above may not be a complete list of foods and beverages you should avoid. Contact a dietitian for more information. Where to find more information  National Heart, Lung, and Blood Institute: PopSteam.is  American Heart Association: www.heart.org  Academy of Nutrition and Dietetics: www.eatright.org  National Kidney Foundation: www.kidney.org Summary  The DASH  eating plan is a healthy eating plan that has been shown to reduce high blood pressure (hypertension). It may also reduce your risk for type 2 diabetes, heart disease, and stroke.  When on the DASH eating plan, aim to eat more fresh fruits and vegetables, whole grains, lean proteins, low-fat dairy, and heart-healthy fats.  With the DASH eating plan, you should limit salt (sodium) intake to 2,300 mg a day. If you have hypertension, you may need to reduce your sodium intake to 1,500 mg a day.  Work with your health care provider or dietitian to adjust your eating plan to your individual calorie needs. This information is not intended to replace advice given to you by your health care provider. Make sure you discuss any questions you have with your health care provider. Document Revised: 11/23/2018 Document Reviewed: 11/23/2018 Elsevier Patient Education  2021 [employer].

## 2020-01-28 ENCOUNTER — Other Ambulatory Visit (INDEPENDENT_AMBULATORY_CARE_PROVIDER_SITE_OTHER): Payer: Medicare HMO

## 2020-01-28 DIAGNOSIS — R946 Abnormal results of thyroid function studies: Secondary | ICD-10-CM

## 2020-01-28 DIAGNOSIS — R7989 Other specified abnormal findings of blood chemistry: Secondary | ICD-10-CM | POA: Diagnosis not present

## 2020-01-28 LAB — COMPREHENSIVE METABOLIC PANEL
ALT: 8 U/L (ref 0–53)
AST: 13 U/L (ref 0–37)
Albumin: 4.3 g/dL (ref 3.5–5.2)
Alkaline Phosphatase: 112 U/L (ref 39–117)
BUN: 17 mg/dL (ref 6–23)
CO2: 32 mEq/L (ref 19–32)
Calcium: 9.3 mg/dL (ref 8.4–10.5)
Chloride: 102 mEq/L (ref 96–112)
Creatinine, Ser: 1.07 mg/dL (ref 0.40–1.50)
GFR: 76.19 mL/min (ref >60.00)
Glucose, Bld: 69 mg/dL — ABNORMAL LOW (ref 70–99)
Potassium: 4.5 mEq/L (ref 3.5–5.1)
Sodium: 137 mEq/L (ref 135–145)
Total Bilirubin: 0.3 mg/dL (ref 0.2–1.2)
Total Protein: 7.1 g/dL (ref 6.0–8.3)

## 2020-01-28 LAB — CBC WITH DIFFERENTIAL/PLATELET
Basophils Absolute: 0.1 10*3/uL (ref 0.0–0.1)
Basophils Relative: 1.3 % (ref 0.0–3.0)
Eosinophils Absolute: 0.2 10*3/uL (ref 0.0–0.7)
Eosinophils Relative: 1.9 % (ref 0.0–5.0)
HCT: 41.7 % (ref 39.0–52.0)
Hemoglobin: 14.3 g/dL (ref 13.0–17.0)
Lymphocytes Relative: 20.8 % (ref 12.0–46.0)
Lymphs Abs: 1.8 10*3/uL (ref 0.7–4.0)
MCHC: 34.2 g/dL (ref 30.0–36.0)
MCV: 98.5 fl (ref 78.0–100.0)
Monocytes Absolute: 0.6 10*3/uL (ref 0.1–1.0)
Monocytes Relative: 6.7 % (ref 3.0–12.0)
Neutro Abs: 6.1 10*3/uL (ref 1.4–7.7)
Neutrophils Relative %: 69.3 % (ref 43.0–77.0)
Platelets: 281 10*3/uL (ref 150.0–400.0)
RBC: 4.23 Mil/uL (ref 4.22–5.81)
RDW: 13 % (ref 11.5–15.5)
WBC: 8.8 10*3/uL (ref 4.0–10.5)

## 2020-01-28 LAB — TSH: TSH: 0.34 u[IU]/mL — ABNORMAL LOW (ref 0.35–4.50)

## 2020-01-28 LAB — T4, FREE: Free T4: 0.87 ng/dL (ref 0.60–1.60)

## 2020-01-28 LAB — T3, FREE: T3, Free: 3.3 pg/mL (ref 2.3–4.2)

## 2020-01-30 NOTE — Progress Notes (Signed)
Cardiology Office Note   Date:  01/31/2020   ID:  Michael [name], DOB 1960/10/05, MRN [id number]  PCP:  Waldon Merl, PA-C  Cardiologist: Dr. Royann Shivers No chief complaint on file.    History of Present Illness: Michael [name] is a 60 y.o. male who presents for ongoing assessment and management of hypertension, sinus node dysfunction, status post pacemaker, atrial fibrillation, ongoing tobacco abuse.  Last seen by Dr. Royann Shivers on 07/08/2019 at which time he had been experiencing substernal chest pain that occurred randomly.  Was not associated with exertion.    He also reported dyspnea on exertion which is chronic.  He denied any palpitations, edema, or focal neurologic deficits.  At the time of that office visit, Dr. Royann Shivers did not feel the pain was cardiac in etiology.  The symptoms were exacerbated by placing generator on his chest.  He did not feel it was angina related to ACS.  Imdur had been tried in the past, but the patient had not taken any.  He felt it was more likely related to his COPD with contaminant heavy tobacco use.  He was to follow-up with his pulmonologist [name].   Although primary care physician's note has not been completed yet, the patient and his caregiver state that he was newly placed on losartan HCTZ 100/25 mg daily.  He has also been changed to a different antidepressant but they do not have a name for this.  He had been on Prozac prior to this time.  He states that his primary care physician is leaving the practice and they are looking for a different provider.  Until that time he has asked [place] to manage his blood pressure.  He has had some complaints of dizziness since starting the medication but no syncope.  He denies any chest pain or palpitations.  He continues to have issues with his breathing, but he continues to smoke heavily.  He also drinks several Mountain Dew's a day.   Past Medical History:  Diagnosis Date  . Allergy   . Anginal pain (HCC)   . Anxiety    . Arthritis   . Bradycardia   . CHF (congestive heart failure) (HCC)   . Chronic chest pain   . COPD (chronic obstructive pulmonary disease) (HCC)   . Depression   . Emphysema of lung (HCC)   . GERD (gastroesophageal reflux disease)   . History of nuclear stress test 06/14/2011   lexiscan; mild perfusion defect in basal inferosetpal & apical inferior region; negative for ischemia   . Hypothyroidism   . Pacemaker   . Pneumonia   . 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 Seizure disorder (HCC)   . Seizures (HCC)    as a baby  . Shortness of breath   . Tobacco abuse     Past Surgical History:  Procedure Laterality Date  . CARDIAC CATHETERIZATION  2002, 2003   normal coronaries  . EYE SURGERY  [age] y.o.  . GALLBLADDER SURGERY  2003  . HAND SURGERY  1998   x3  . OPEN REDUCTION INTERNAL FIXATION (ORIF) DISTAL RADIAL FRACTURE Right 09/07/2016   Procedure: RIGHT WRIST OPEN REDUCTION INTERNAL FIXATION (ORIF) REPAIR AS INDICATED;  Surgeon: Bradly Bienenstock, MD;  Location: [facility] OR;  Service: Orthopedics;  Laterality: Right;  . PACEMAKER INSERTION  04/23/2003   Medtr[employer] Kappa; [facility] - symptomatic bradycardia  . PERMANENT PACEMAKER GENERATOR CHANGE N/A 09/06/2012   Procedure: PERMANENT  PACEMAKER GENERATOR CHANGE;  Surgeon: Thurmon Fair, MD;  Location: [facility] CATH LAB;  Service: Cardiovascular;  Laterality: N/A;  . TONSILLECTOMY    . TRANSTHORACIC ECHOCARDIOGRAM  05/14/2012   EF 50-55%, normal systolic function; mild MR (ordered for mitral valve disease 424.0)     Current Outpatient Medications  Medication Sig Dispense Refill  . albuterol (ACCUNEB) 1.25 MG/3ML nebulizer solution USE 1 VIAL IN NEBULIZER EVERY 6 HOURS AS NEEDED FOR WHEEZING 150 mL 0  . budesonide-formoterol (SYMBICORT) 160-4.5 MCG/ACT inhaler budesonide-formoterol HFA 160 mcg-4.5 mcg/actuation aerosol inhaler  INHALE 2 PUFFS FIRST THING IN THE MORNING AND THEN ANOTHER 2 PUFFS  ABOUT 12 HOURS LATER    . DULoxetine (CYMBALTA) 30 MG capsule Take 1 capsule (30 mg total) by mouth daily. 30 capsule 3  . fluticasone (FLONASE) 50 MCG/ACT nasal spray Place 2 sprays into both nostrils as needed.    [name] losartan-hydrochlorothiazide (HYZAAR) 100-25 MG tablet Take 1 tablet by mouth daily. 30 tablet 1  . Multiple Vitamin (MULTIVITAMIN WITH MINERALS) TABS tablet Take 1 tablet by mouth daily.    [name] omeprazole (PRILOSEC) 20 MG capsule Take 1 capsule (20 mg total) by mouth 2 (two) times daily. 180 capsule 1  . Tiotropium Bromide Monohydrate (SPIRIVA RESPIMAT) 2.5 MCG/ACT AERS Inhale 2 puffs into the lungs daily. 3 each 3  . trimethoprim-polymyxin b (POLYTRIM) ophthalmic solution Apply 1-2 drops into affected eye QID x 5 days. 10 mL 0  . VENTOLIN HFA 108 (90 Base) MCG/ACT inhaler Up to 2 pffs every 4 hours as needed 3 each 3  . Vitamin D, Ergocalciferol, (DRISDOL) 1.25 MG (50000 UNIT) CAPS capsule Take 1 capsule (50,000 Units total) by mouth every 7 (seven) days. 10 capsule 0   No current facility-administered medications for this visit.    Allergies:   Shellfish allergy    Social History:  The patient  reports that he has been smoking cigarettes. He has a 40.00 pack-year smoking history. He has never used smokeless tobacco. He reports that he does not drink alcohol and does not use drugs.   Family History:  The patient's family history includes CAD in his father; COPD in his father; Heart Problems in his mother and another family member; Heart attack in his maternal grandmother, paternal grandfather, and paternal grandmother; Heart disease in his father and mother; Hypertension in his maternal grandmother, paternal grandfather, and paternal grandmother; Other in his sister.    ROS: All other systems are reviewed and negative. Unless otherwise mentioned in H&P    PHYSICAL EXAM: VS:  BP 127/62   Pulse 85   Ht 5\' 9"  (1.753 m)   Wt 134 lb 3.2 oz (60.9 kg)   SpO2 96%   BMI 19.82  kg/m  , BMI Body mass index is 19.82 kg/m. GEN: Well nourished, well developed, in no acute distress, smelling heavily of cigarettes HEENT: normal Neck: no JVD, carotid bruits, or masses Cardiac: RRR; no murmurs, rubs, or gallops,no edema  Respiratory:  Clear to auscultation bilaterally, normal work of breathing GI: soft, nontender, nondistended, + BS MS: no deformity or atrophy Skin: warm and dry, no rash Neuro:  Strength and sensation are intact Psych: euthymic mood, full affect   EKG:   Not completed during this office visit  Recent Labs: 01/27/2020: ALT 8; BUN 17; Creatinine, Ser 1.07; Hemoglobin 14.3; Platelets 281.0; Potassium 4.5; Sodium 137; TSH 0.34    Lipid Panel    Component Value Date/Time   CHOL 153 09/04/2012 0839   TRIG 96  09/04/2012 0839   HDL 45 09/04/2012 0839   CHOLHDL 3.4 09/04/2012 0839   VLDL 19 09/04/2012 0839   LDLCALC 89 09/04/2012 0839      Wt Readings from Last 3 Encounters:  01/31/20 134 lb 3.2 oz (60.9 kg)  01/27/20 139 lb (63 kg)  12/10/19 133 lb (60.3 kg)      Other studies Reviewed: 05/14/2020 Left ventricle: The cavity size was normal. Wall thickness  was normal. Systolic function was normal. The estimated  ejection fraction was in the range of 50% to 55%. Wall  motion was normal; there were no regional wall motion  abnormalities. Left ventricular diastolic function  parameters were normal.  - Mitral valve: Mild regurgitation. Valve area by pressure  half-time: 2.02cm^2.  - Atrial septum: No defect or patent foramen ovale was  identified.     ASSESSMENT AND PLAN:  1.  Hypertension: Currently well controlled with addition of losartan HCTZ 100/25 mg tablet daily.  This was placed by his PCP.  As he does not have a PCP at this time I want to see him back in a month with a BMET to evaluate kidney function.  In the interim we have provided him with a blood pressure machine and have asked him to take his blood pressure  twice a day recorded at home, especially when he is feeling lightheaded or dizzy.  Adjustments may need to be made on this medication once we follow with trend for a month.  He is advised to decrease or give up caffeinated [facility] as this can also cause some dehydration leading to dizziness especially with use of diuretic with in the antihypertensive.  He verbalizes understanding  2.  Pacemaker in situ: Follow-up pacemaker interrogation April 06, 2020 with Dr. Royann Shivers.  3.  COPD: He is on multiple inhalers however he is not chosen to give up smoking.  I have reinforced the need to do so but he does not appear to be willing to consider it at this time.  Current medicines are reviewed at length with the patient today.  I have spent 25 mins  dedicated to the care of this patient on the date of this encounter to include pre-visit review of records, assessment, management and diagnostic testing,with shared decision making.  Labs/ tests ordered today include: BMET one month  Bettey Mare. Liborio Nixon, ANP, AACC   01/31/2020 2:38 PM    [facility] [address] Office [phone] Fax [phone]  Notice: This dictation was prepared with Dragon dictation along with smaller phrase technology. Any transcriptional errors that result from this process are unintentional and may not be corrected upon review.

## 2020-01-31 ENCOUNTER — Encounter: Payer: Self-pay | Admitting: Adult Health

## 2020-01-31 ENCOUNTER — Other Ambulatory Visit: Payer: Self-pay

## 2020-01-31 ENCOUNTER — Ambulatory Visit: Payer: Medicare HMO | Admitting: Adult Health

## 2020-01-31 VITALS — BP 127/62 | HR 85 | Ht 69.0 in | Wt 134.2 lb

## 2020-01-31 DIAGNOSIS — J449 Chronic obstructive pulmonary disease, unspecified: Secondary | ICD-10-CM

## 2020-01-31 DIAGNOSIS — R001 Bradycardia, unspecified: Secondary | ICD-10-CM

## 2020-01-31 DIAGNOSIS — I10 Essential (primary) hypertension: Secondary | ICD-10-CM | POA: Diagnosis not present

## 2020-01-31 DIAGNOSIS — Z79899 Other long term (current) drug therapy: Secondary | ICD-10-CM

## 2020-01-31 NOTE — Patient Instructions (Signed)
Medication Instructions:  Continue current medications  *If you need a refill on your cardiac medications before your next appointment, please call your pharmacy*   Lab Work: BMP   If you have labs (blood work) drawn today and your tests are completely normal, you will receive your results only by: [name] MyChart Message (if you have MyChart) OR . A paper copy in the mail If you have any lab test that is abnormal or we need to change your treatment, we will call you to review the results.   Testing/Procedures: None Ordered   Follow-Up: At [facility], you and your health needs are our priority.  As part of our continuing mission to provide you with exceptional heart care, we have created designated Provider Care Teams.  These Care Teams include your primary Cardiologist (physician) and Advanced Practice Providers (APPs -  Physician Assistants and Nurse Practitioners) who all work together to provide you with the care you need, when you need it.  We recommend signing up for the patient portal called "MyChart".  Sign up information is provided on this After Visit Summary.  MyChart is used to connect with patients for Virtual Visits (Telemedicine).  Patients are able to view lab/test results, encounter notes, upcoming appointments, etc.  Non-urgent messages can be sent to your provider as well.   To learn more about what you can do with MyChart, go to [email or web address].    Your next appointment:   Friday February 25th @ 2:45 pm  The format for your next appointment:   In Person  Provider:   You will see one of the following Advanced Practice Providers on your designated Care Team:     Joni Reining, DNP, ANP

## 2020-02-27 NOTE — Progress Notes (Signed)
Cardiology Office Note   Date:  02/27/2020   ID:  Michael [name], DOB 07-16-1960, MRN [id number]  PCP:  Waldon Merl, PA-C  Cardiologist: Dr. Royann Shivers  CC: Follow up BP   History of Present Illness: Michael [name] is a 60 y.o. male who presents for ongoing assessment and management of hypertension, sinus node dysfunction status post pacemaker, atrial fibrillation, ongoing tobacco abuse.  Was last seen in the office on 01/31/2020.  He had had recent changes in his medication regimen for hypertensive control.  The patient was on losartan HCTZ 100/25 mg daily and had recently started this.  I ordered a BMET to evaluate her kidney function.  I also provided him with a blood pressure machine and asked him to take his blood pressure twice a day recorded at home and bring it with him to this appointment.  Especially if you were feeling dizzy or lightheaded.  He may need adjustment of his medications.    He was also advised to decrease caffeinated [facility] as this can cause some dehydration leading to dizziness especially with the use of diuretic.  He was to follow-up for pacemaker interrogation per protocol.  Smoking cessation was discussed.  He is on multiple inhalers for COPD.  He was not inclined to stop smoking at time of office visit.  He comes today with multiple complaints of arthritis pain, his hands and shoulder and back.  He has not stopped smoking nor has he stopped drinking [employer].  Past Medical History:  Diagnosis Date  . Allergy   . Anginal pain (HCC)   . Anxiety   . Arthritis   . Bradycardia   . CHF (congestive heart failure) (HCC)   . Chronic chest pain   . COPD (chronic obstructive pulmonary disease) (HCC)   . Depression   . Emphysema of lung (HCC)   . GERD (gastroesophageal reflux disease)   . History of nuclear stress test 06/14/2011   lexiscan; mild perfusion defect in basal inferosetpal & apical inferior region; negative for ischemia   . Hypothyroidism   .  Pacemaker   . Pneumonia   . 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 Seizure disorder (HCC)   . Seizures (HCC)    as a baby  . Shortness of breath   . Tobacco abuse     Past Surgical History:  Procedure Laterality Date  . CARDIAC CATHETERIZATION  2002, 2003   normal coronaries  . EYE SURGERY  [age] y.o.  . GALLBLADDER SURGERY  2003  . HAND SURGERY  1998   x3  . OPEN REDUCTION INTERNAL FIXATION (ORIF) DISTAL RADIAL FRACTURE Right 09/07/2016   Procedure: RIGHT WRIST OPEN REDUCTION INTERNAL FIXATION (ORIF) REPAIR AS INDICATED;  Surgeon: Bradly Bienenstock, MD;  Location: [facility] OR;  Service: Orthopedics;  Laterality: Right;  . PACEMAKER INSERTION  04/23/2003   Medtr[employer] Kappa; [facility] - symptomatic bradycardia  . PERMANENT PACEMAKER GENERATOR CHANGE N/A 09/06/2012   Procedure: PERMANENT PACEMAKER GENERATOR CHANGE;  Surgeon: Thurmon Fair, MD;  Location: [facility] CATH LAB;  Service: Cardiovascular;  Laterality: N/A;  . TONSILLECTOMY    . TRANSTHORACIC ECHOCARDIOGRAM  05/14/2012   EF 50-55%, normal systolic function; mild MR (ordered for mitral valve disease 424.0)     Current Outpatient Medications  Medication Sig Dispense Refill  . albuterol (ACCUNEB) 1.25 MG/3ML nebulizer solution USE 1 VIAL IN NEBULIZER EVERY 6 HOURS AS NEEDED FOR WHEEZING 150 mL 0  . budesonide-formoterol (  SYMBICORT) 160-4.5 MCG/ACT inhaler budesonide-formoterol HFA 160 mcg-4.5 mcg/actuation aerosol inhaler  INHALE 2 PUFFS FIRST THING IN THE MORNING AND THEN ANOTHER 2 PUFFS ABOUT 12 HOURS LATER    . DULoxetine (CYMBALTA) 30 MG capsule Take 1 capsule (30 mg total) by mouth daily. 30 capsule 3  . fluticasone (FLONASE) 50 MCG/ACT nasal spray Place 2 sprays into both nostrils as needed.    [name] losartan-hydrochlorothiazide (HYZAAR) 100-25 MG tablet Take 1 tablet by mouth daily. 30 tablet 1  . Multiple Vitamin (MULTIVITAMIN WITH MINERALS) TABS tablet Take 1 tablet by mouth daily.    [name]  omeprazole (PRILOSEC) 20 MG capsule Take 1 capsule (20 mg total) by mouth 2 (two) times daily. 180 capsule 1  . Tiotropium Bromide Monohydrate (SPIRIVA RESPIMAT) 2.5 MCG/ACT AERS Inhale 2 puffs into the lungs daily. 3 each 3  . trimethoprim-polymyxin b (POLYTRIM) ophthalmic solution Apply 1-2 drops into affected eye QID x 5 days. 10 mL 0  . VENTOLIN HFA 108 (90 Base) MCG/ACT inhaler Up to 2 pffs every 4 hours as needed 3 each 3  . Vitamin D, Ergocalciferol, (DRISDOL) 1.25 MG (50000 UNIT) CAPS capsule Take 1 capsule (50,000 Units total) by mouth every 7 (seven) days. 10 capsule 0   No current facility-administered medications for this visit.    Allergies:   Shellfish allergy    Social History:  The patient  reports that he has been smoking cigarettes. He has a 40.00 pack-year smoking history. He has never used smokeless tobacco. He reports that he does not drink alcohol and does not use drugs.   Family History:  The patient's family history includes CAD in his father; COPD in his father; Heart Problems in his mother and another family member; Heart attack in his maternal grandmother, paternal grandfather, and paternal grandmother; Heart disease in his father and mother; Hypertension in his maternal grandmother, paternal grandfather, and paternal grandmother; Other in his sister.    ROS: All other systems are reviewed and negative. Unless otherwise mentioned in H&P    PHYSICAL EXAM: VS:  There were no vitals taken for this visit. , BMI There is no height or weight on file to calculate BMI. GEN: Well nourished, well developed, in no acute distress HEENT: normal Neck: no JVD, carotid bruits, or masses Cardiac: RRR; no murmurs, rubs, or gallops,no edema  Respiratory:  Clear to auscultation bilaterally, normal work of breathing GI: soft, nontender, nondistended, + BS MS: no deformity or atrophy, deformity in the right thumb and wrist Skin: warm and dry, no rash Neuro:  Strength and sensation  are intact Psych: euthymic mood, full affect   EKG:  Recent Labs: 01/27/2020: ALT 8; BUN 17; Creatinine, Ser 1.07; Hemoglobin 14.3; Platelets 281.0; Potassium 4.5; Sodium 137; TSH 0.34    Lipid Panel    Component Value Date/Time   CHOL 153 09/04/2012 0839   TRIG 96 09/04/2012 0839   HDL 45 09/04/2012 0839   CHOLHDL 3.4 09/04/2012 0839   VLDL 19 09/04/2012 0839   LDLCALC 89 09/04/2012 0839      Wt Readings from Last 3 Encounters:  01/31/20 134 lb 3.2 oz (60.9 kg)  01/27/20 139 lb (63 kg)  12/10/19 133 lb (60.3 kg)      Other studies Reviewed: Echocardiogram 2019-05-20  Left ventricle: The cavity size was normal. Wall thickness  was normal. Systolic function was normal. The estimated  ejection fraction was in the range of 50% to 55%. Wall  motion was normal; there were no regional wall  motion  abnormalities. Left ventricular diastolic function  parameters were normal.  - Mitral valve: Mild regurgitation. Valve area by pressure  half-time: 2.02cm^2.  - Atrial septum: No defect or patent foramen ovale was  identified.   ASSESSMENT AND PLAN:  1. Iatrogenic  Hypotension: I will decrease his losartan HCTZ from 100/25 to 50/12.5 mg to allow for better blood pressure and less complaints of dizziness and fatigue.  The patient is not taking any opioids or medications which would cause hypotension other than drinking a lot of caffeinated beverages because of his fatigue which can be dehydrating.  He will follow-up with [place] in about 2 to 3 weeks.  I have asked him to get a blood pressure machine and record his blood pressures we do not have any blood pressure machines that we can provide from our office at this time.  2.  Chronic orthopedic pain: Mostly in his hands wrists and back.  He has been advised to follow-up with orthopedics as he is already been established with their office for ongoing evaluation and potential intervention.  3.  Chronic anxiety: The patient does  have some mental health issues which are being followed by PCP.  Defer.  4.  Ongoing tobacco abuse: The patient has no intention of quitting.  It is fruitless to speak with him about this as several attempts have been made.   Current medicines are reviewed at length with the patient today.  I have spent 20 min's dedicated to the care of this patient on the date of this encounter to include pre-visit review of records, assessment, management and diagnostic testing,with shared decision making.  Labs/ tests ordered today include: None  Bettey Mare. Liborio Nixon, ANP, [facility]   02/27/2020 6:41 PM    [facility] [address] Office [phone] Fax [phone]  Notice: This dictation was prepared with Dragon dictation along with smaller phrase technology. Any transcriptional errors that result from this process are unintentional and may not be corrected upon review.

## 2020-02-28 ENCOUNTER — Ambulatory Visit (INDEPENDENT_AMBULATORY_CARE_PROVIDER_SITE_OTHER): Payer: Medicare HMO | Admitting: Adult Health

## 2020-02-28 ENCOUNTER — Other Ambulatory Visit: Payer: Self-pay

## 2020-02-28 ENCOUNTER — Encounter: Payer: Self-pay | Admitting: Adult Health

## 2020-02-28 VITALS — BP 92/64 | HR 82 | Ht 69.0 in | Wt 132.8 lb

## 2020-02-28 DIAGNOSIS — I9589 Other hypotension: Secondary | ICD-10-CM

## 2020-02-28 DIAGNOSIS — F419 Anxiety disorder, unspecified: Secondary | ICD-10-CM | POA: Diagnosis not present

## 2020-02-28 DIAGNOSIS — M7918 Myalgia, other site: Secondary | ICD-10-CM

## 2020-02-28 DIAGNOSIS — F17210 Nicotine dependence, cigarettes, uncomplicated: Secondary | ICD-10-CM | POA: Diagnosis not present

## 2020-02-28 MED ORDER — LOSARTAN POTASSIUM-HCTZ 50-12.5 MG PO TABS: 1.0000 | ORAL_TABLET | Freq: Every day | ORAL | 3 refills | Status: DC

## 2020-02-28 NOTE — Patient Instructions (Signed)
Medication Instructions:  DECREASE- Losartan/HCTz 50/12.5 mg by mouth daily  *If you need a refill on your cardiac medications before your next appointment, please call your pharmacy*   Lab Work: None Ordered   Testing/Procedures: None Ordered   Follow-Up: At [facility], you and your health needs are our priority.  As part of our continuing mission to provide you with exceptional heart care, we have created designated Provider Care Teams.  These Care Teams include your primary Cardiologist (physician) and Advanced Practice Providers (APPs -  Physician Assistants and Nurse Practitioners) who all work together to provide you with the care you need, when you need it.  We recommend signing up for the patient portal called "MyChart".  Sign up information is provided on this After Visit Summary.  MyChart is used to connect with patients for Virtual Visits (Telemedicine).  Patients are able to view lab/test results, encounter notes, upcoming appointments, etc.  Non-urgent messages can be sent to your provider as well.   To learn more about what you can do with MyChart, go to [email or web address].    Your next appointment:   Friday March 11th @ 2:15 PM  The format for your next appointment:   In Person  Provider:   Joni Reining, DNP, ANP

## 2020-03-12 NOTE — Progress Notes (Signed)
Cardiology Office Note   Date:  03/13/2020   ID:  [name], DOB Apr 20, 1960, MRN [id number]  PCP:  Waldon Merl, PA-C  Cardiologist: Dr. Royann Shivers CC: BP check follow up    History of Present Illness: Michael [name] is a 60 y.o. male who presents for ongoing assessment and management of hypertension, sinus node dysfunction status post PPM, atrial fibrillation, and ongoing tobacco abuse.  The patient has been on losartan HCTZ 100/25 mg daily, he was to reduce caffeinated [facility] as this was causing some dehydration and dizziness especially with the use of diuretic.  His main complaint is arthritis pains in his hands shoulders and back.  He is not stop smoking or drinking [employer].  When seen last on 02/28/2020 the patient was found to be hypotensive.  I decreased his losartan HCTZ from 100/25 mg daily to 50/12.5 mg daily to allow for better blood pressure control and less complaints of dizziness and fatigue.  He was advised to take his blood pressure every day and record this.  He is being seen in the office today for reevaluation of his response to medications and blood pressure control.  He was counseled on smoking cessation but has no desire at all to quit.  Blood pressures much better with reduction of losartan/HCTZ today.  He seems brighter.  He does not have a primary care physician as the last 2 providers have relocated.  He is supposed to follow-up with Adolph Pollack at [address]. He does not drive, and his mother cares for him, and picks up his medications. He is in need of PCP refill of medications such as Prozac and pain medication. .  Past Medical History:  Diagnosis Date  . Allergy   . Anginal pain (HCC)   . Anxiety   . Arthritis   . Bradycardia   . CHF (congestive heart failure) (HCC)   . Chronic chest pain   . COPD (chronic obstructive pulmonary disease) (HCC)   . Depression   . Emphysema of lung (HCC)   . GERD (gastroesophageal reflux disease)   . History  of nuclear stress test 06/14/2011   lexiscan; mild perfusion defect in basal inferosetpal & apical inferior region; negative for ischemia   . Hypothyroidism   . Pacemaker   . Pneumonia   . 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 Seizure disorder (HCC)   . Seizures (HCC)    as a baby  . Shortness of breath   . Tobacco abuse     Past Surgical History:  Procedure Laterality Date  . CARDIAC CATHETERIZATION  2002, 2003   normal coronaries  . EYE SURGERY  [age] y.o.  . GALLBLADDER SURGERY  2003  . HAND SURGERY  1998   x3  . OPEN REDUCTION INTERNAL FIXATION (ORIF) DISTAL RADIAL FRACTURE Right 09/07/2016   Procedure: RIGHT WRIST OPEN REDUCTION INTERNAL FIXATION (ORIF) REPAIR AS INDICATED;  Surgeon: Bradly Bienenstock, MD;  Location: [facility] OR;  Service: Orthopedics;  Laterality: Right;  . PACEMAKER INSERTION  04/23/2003   Medtronic Kappa; [facility] - symptomatic bradycardia  . PERMANENT PACEMAKER GENERATOR CHANGE N/A 09/06/2012   Procedure: PERMANENT PACEMAKER GENERATOR CHANGE;  Surgeon: Thurmon Fair, MD;  Location: [facility] CATH LAB;  Service: Cardiovascular;  Laterality: N/A;  . TONSILLECTOMY    . TRANSTHORACIC ECHOCARDIOGRAM  05/14/2012   EF 50-55%, normal systolic function; mild MR (ordered for mitral valve disease 424.0)  Current Outpatient Medications  Medication Sig Dispense Refill  . albuterol (ACCUNEB) 1.25 MG/3ML nebulizer solution USE 1 VIAL IN NEBULIZER EVERY 6 HOURS AS NEEDED FOR WHEEZING 150 mL 0  . budesonide-formoterol (SYMBICORT) 160-4.5 MCG/ACT inhaler budesonide-formoterol HFA 160 mcg-4.5 mcg/actuation aerosol inhaler  INHALE 2 PUFFS FIRST THING IN THE MORNING AND THEN ANOTHER 2 PUFFS ABOUT 12 HOURS LATER    . DULoxetine (CYMBALTA) 30 MG capsule Take 1 capsule (30 mg total) by mouth daily. 30 capsule 3  . fluticasone (FLONASE) 50 MCG/ACT nasal spray Place 2 sprays into both nostrils as needed.    [name] losartan-hydrochlorothiazide  (HYZAAR) 50-12.5 MG tablet Take 1 tablet by mouth daily. 90 tablet 3  . Multiple Vitamin (MULTIVITAMIN WITH MINERALS) TABS tablet Take 1 tablet by mouth daily.    [name] omeprazole (PRILOSEC) 20 MG capsule Take 1 capsule (20 mg total) by mouth 2 (two) times daily. 180 capsule 1  . Tiotropium Bromide Monohydrate (SPIRIVA RESPIMAT) 2.5 MCG/ACT AERS Inhale 2 puffs into the lungs daily. 3 each 3  . VENTOLIN HFA 108 (90 Base) MCG/ACT inhaler Up to 2 pffs every 4 hours as needed 3 each 3  . Vitamin D, Ergocalciferol, (DRISDOL) 1.25 MG (50000 UNIT) CAPS capsule Take 1 capsule (50,000 Units total) by mouth every 7 (seven) days. 10 capsule 0   No current facility-administered medications for this visit.    Allergies:   Shellfish allergy    Social History:  The patient  reports that he has been smoking cigarettes. He has a 40.00 pack-year smoking history. He has never used smokeless tobacco. He reports that he does not drink alcohol and does not use drugs.   Family History:  The patient's family history includes CAD in his father; COPD in his father; Heart Problems in his mother and another family member; Heart attack in his maternal grandmother, paternal grandfather, and paternal grandmother; Heart disease in his father and mother; Hypertension in his maternal grandmother, paternal grandfather, and paternal grandmother; Other in his sister.    ROS: All other systems are reviewed and negative. Unless otherwise mentioned in H&P    PHYSICAL EXAM: VS:  BP 117/61   Pulse 80   Ht 5\' 9"  (1.753 m)   Wt 134 lb 9.6 oz (61.1 kg)   SpO2 95%   BMI 19.88 kg/m  , BMI Body mass index is 19.88 kg/m. GEN: Well nourished, well developed, in no acute distress HEENT: normal Neck: no JVD, carotid bruits, or masses Cardiac: RRR; no murmurs, rubs, or gallops,no edema  Respiratory:  Clear to auscultation bilaterally, normal work of breathing, some expiratory wheezes. GI: soft, nontender, nondistended, + BS MS: no  deformity or atrophy Skin: warm and dry, no rash Neuro:  Strength and sensation are intact Psych: euthymic mood, full affect   EKG: Not completed this office visit  Recent Labs: 01/27/2020: ALT 8; BUN 17; Creatinine, Ser 1.07; Hemoglobin 14.3; Platelets 281.0; Potassium 4.5; Sodium 137; TSH 0.34    Lipid Panel    Component Value Date/Time   CHOL 153 09/04/2012 0839   TRIG 96 09/04/2012 0839   HDL 45 09/04/2012 0839   CHOLHDL 3.4 09/04/2012 0839   VLDL 19 09/04/2012 0839   LDLCALC 89 09/04/2012 0839      Wt Readings from Last 3 Encounters:  03/13/20 134 lb 9.6 oz (61.1 kg)  02/28/20 132 lb 12.8 oz (60.2 kg)  01/31/20 134 lb 3.2 oz (60.9 kg)      Other studies Reviewed: Other studies Reviewed: Echocardiogram  05/15/2019  Left ventricle: The cavity size was normal. Wall thickness  was normal. Systolic function was normal. The estimated  ejection fraction was in the range of 50% to 55%. Wall  motion was normal; there were no regional wall motion  abnormalities. Left ventricular diastolic function  parameters were normal.  - Mitral valve: Mild regurgitation. Valve area by pressure  half-time: 2.02cm^2.  - Atrial septum: No defect or patent foramen ovale was  identified.   ASSESSMENT AND PLAN:  1.  Iatrogenic hypotension: Decreasing losartan/HCTZ dose has significantly improved blood pressure, and his energy level.  Continue current dose.  2.  Anxiety/depression: We will need to follow with PCP.  He does not currently have a PCP as his the last 2 PCPs relocated.  He is to [facility]-up with [facility] at [facility] [address] [facility] [place].  Phone number has been provided for them to call and make appointment.  3.  Permanent pacemaker in situ: Follow-up with pacemaker clinic for ongoing remote checks and in person follow-up annually.  Current medicines are reviewed at length with the patient today.  I have spent 25 minutes dedicated to the care of this patient on the  date of this encounter to include pre-visit review of records, assessment, management and diagnostic testing,with shared decision making.  Labs/ tests ordered today include: None  Bettey Mare. Liborio Nixon, ANP, AACC   03/13/2020 2:41 PM    [facility] [address] Office [phone] Fax [phone]  Notice: This dictation was prepared with Dragon dictation along with smaller phrase technology. Any transcriptional errors that result from this process are unintentional and may not be corrected upon review.

## 2020-03-13 ENCOUNTER — Other Ambulatory Visit: Payer: Self-pay

## 2020-03-13 ENCOUNTER — Ambulatory Visit (INDEPENDENT_AMBULATORY_CARE_PROVIDER_SITE_OTHER): Payer: Medicare HMO | Admitting: Adult Health

## 2020-03-13 ENCOUNTER — Encounter: Payer: Self-pay | Admitting: Adult Health

## 2020-03-13 VITALS — BP 117/61 | HR 80 | Ht 69.0 in | Wt 134.6 lb

## 2020-03-13 DIAGNOSIS — F32A Depression, unspecified: Secondary | ICD-10-CM | POA: Diagnosis not present

## 2020-03-13 DIAGNOSIS — Z950 Presence of cardiac pacemaker: Secondary | ICD-10-CM

## 2020-03-13 DIAGNOSIS — I10 Essential (primary) hypertension: Secondary | ICD-10-CM | POA: Diagnosis not present

## 2020-03-13 DIAGNOSIS — I9589 Other hypotension: Secondary | ICD-10-CM

## 2020-03-13 NOTE — Patient Instructions (Signed)

## 2020-03-13 NOTE — Progress Notes (Signed)
Thanks

## 2020-04-06 ENCOUNTER — Ambulatory Visit: Payer: Medicare HMO

## 2020-04-13 ENCOUNTER — Other Ambulatory Visit: Payer: Self-pay

## 2020-04-13 ENCOUNTER — Ambulatory Visit (INDEPENDENT_AMBULATORY_CARE_PROVIDER_SITE_OTHER): Payer: Medicare HMO | Admitting: Family Medicine

## 2020-04-13 ENCOUNTER — Telehealth: Payer: Self-pay

## 2020-04-13 ENCOUNTER — Encounter: Payer: Self-pay | Admitting: Family Medicine

## 2020-04-13 VITALS — BP 105/67 | HR 68 | Temp 97.9°F | Ht 69.0 in | Wt 132.6 lb

## 2020-04-13 DIAGNOSIS — J9612 Chronic respiratory failure with hypercapnia: Secondary | ICD-10-CM | POA: Diagnosis not present

## 2020-04-13 DIAGNOSIS — I159 Secondary hypertension, unspecified: Secondary | ICD-10-CM

## 2020-04-13 DIAGNOSIS — G40909 Epilepsy, unspecified, not intractable, without status epilepticus: Secondary | ICD-10-CM | POA: Diagnosis not present

## 2020-04-13 DIAGNOSIS — G894 Chronic pain syndrome: Secondary | ICD-10-CM

## 2020-04-13 DIAGNOSIS — F32A Depression, unspecified: Secondary | ICD-10-CM

## 2020-04-13 DIAGNOSIS — K219 Gastro-esophageal reflux disease without esophagitis: Secondary | ICD-10-CM | POA: Diagnosis not present

## 2020-04-13 DIAGNOSIS — M1990 Unspecified osteoarthritis, unspecified site: Secondary | ICD-10-CM | POA: Insufficient documentation

## 2020-04-13 MED ORDER — FLUOXETINE HCL 40 MG PO CAPS: 40.0000 mg | ORAL_CAPSULE | Freq: Every day | ORAL | 3 refills | Status: DC

## 2020-04-13 MED ORDER — OMEPRAZOLE 20 MG PO CPDR: 20.0000 mg | DELAYED_RELEASE_CAPSULE | Freq: Two times a day (BID) | ORAL | 1 refills | Status: DC

## 2020-04-13 NOTE — Telephone Encounter (Signed)
Pt would like prescriptions to be sent in as 90 day supply

## 2020-04-13 NOTE — Assessment & Plan Note (Signed)
Follows with pulmonology.  He is on Symbicort and Spiriva.

## 2020-04-13 NOTE — Assessment & Plan Note (Signed)
At goal on losartan-HCTZ 50-12.5.

## 2020-04-13 NOTE — Patient Instructions (Signed)
It was very nice to see you today!  We will refill your Prozac.  I will also place a referral for you to see a physical medicine doctor.  I will see back in a year.  Come back to see me sooner if needed.  Take care, Dr Jimmey Ralph  PLEASE NOTE:  If you had any lab tests please let us know if you have not heard back within a few days. You may see your results on mychart before we have a chance to review them but we will give you a call once they are reviewed by [place]. If we ordered any referrals today, please let us know if you have not heard from their office within the next week.   Please try these tips to maintain a healthy lifestyle:   Eat at least 3 REAL meals and 1-2 snacks per day.  Aim for no more than 5 hours between eating.  If you eat breakfast, please do so within one hour of getting up.    Each meal should contain half fruits/vegetables, one quarter protein, and one quarter carbs (no bigger than a computer mouse)   Cut down on sweet beverages. This includes juice, soda, and sweet tea.     Drink at least 1 glass of water with each meal and aim for at least 8 glasses per day   Exercise at least 150 minutes every week.

## 2020-04-13 NOTE — Assessment & Plan Note (Signed)
Likely contributing to his chronic pain.  Will place referral to PM&R.

## 2020-04-13 NOTE — Assessment & Plan Note (Signed)
No recent seizures.  Not on any antiseizure medications.

## 2020-04-13 NOTE — Assessment & Plan Note (Signed)
Was on Prozac and was switched to Cymbalta but did not feel like Cymbalta has helped.  He ran out of Cymbalta several weeks ago.  He took a few doses of Prozac which is helped.  We will restart Prozac today.  He will let me know how this is working for him in a few weeks.

## 2020-04-13 NOTE — Assessment & Plan Note (Signed)
Stable. Omeprazole refilled.  

## 2020-04-13 NOTE — Assessment & Plan Note (Signed)
Complex history of pain has been electrocuted with high voltage in the past.  Also sustained several traumatic events with several fractures in the past.  He has been taking ibuprofen with some improvement.  States that pain is still not controlled inhibits activities of daily living.  Will place referral to physical medicine for further management.

## 2020-04-13 NOTE — Telephone Encounter (Signed)
Rx send for 90 days

## 2020-04-13 NOTE — Progress Notes (Signed)
Michael [name] is a 60 y.o. male who presents today for an office visit.  Assessment/Plan:  Chronic Problems Addressed Today: Chronic pain syndrome Complex history of pain has been electrocuted with high voltage in the past.  Also sustained several traumatic events with several fractures in the past.  He has been taking ibuprofen with some improvement.  States that pain is still not controlled inhibits activities of daily living.  Will place referral to physical medicine for further management.  Osteoarthritis Likely contributing to his chronic pain.  Will place referral to PM&R.  GERD (gastroesophageal reflux disease) Stable.  Omeprazole refilled.  Seizure disorder (HCC) No recent seizures.  Not on any antiseizure medications.  Chronic respiratory failure with hypercapnia (HCC) Follows with pulmonology.  He is on Symbicort and Spiriva.  HTN (hypertension) At goal on losartan-HCTZ 50-12.5.  Depression Was on Prozac and was switched to Cymbalta but did not feel like Cymbalta has helped.  He ran out of Cymbalta several weeks ago.  He took a few doses of Prozac which is helped.  We will restart Prozac today.  He will let me know how this is working for him in a few weeks.     Subjective:  HPI:  See A/P for status of chronic conditions.  He is here today to transfer care to this office.  His mother is with him today.   PMH:  The following were reviewed and entered/updated in epic: Past Medical History:  Diagnosis Date  . Allergy   . Anginal pain (HCC)   . Anxiety   . Arthritis   . Bradycardia   . CHF (congestive heart failure) (HCC)   . Chronic chest pain   . COPD (chronic obstructive pulmonary disease) (HCC)   . Depression   . Emphysema of lung (HCC)   . GERD (gastroesophageal reflux disease)   . History of nuclear stress test 06/14/2011   lexiscan; mild perfusion defect in basal inferosetpal & apical inferior region; negative for ischemia   . Hypothyroidism   .  Pacemaker   . Pneumonia   . 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 Seizure disorder (HCC)   . Seizures (HCC)    as a baby  . Shortness of breath   . Tobacco abuse    Patient Active Problem List   Diagnosis Date Noted  . Osteoarthritis 04/13/2020  . Chronic pain syndrome 04/13/2020  . Vitamin D deficiency 11/11/2019  . History of cardiac pacemaker in situ 11/11/2019  . Asthma 11/11/2019  . Seizure disorder (HCC) 03/17/2017  . Hypothyroidism 03/17/2017  . GERD (gastroesophageal reflux disease) 03/17/2017  . Anxiety 03/17/2017  . Depression 03/17/2017  . HTN (hypertension) 03/17/2017  . Cigarette smoker 04/13/2016  . PTSD (post-traumatic stress disorder) 01/27/2016  . Chronic respiratory failure with hypercapnia (HCC) 12/09/2015  . Paroxysmal atrial fibrillation (HCC) 10/22/2013  . Sinus node dysfunction (HCC) 10/22/2013  . Protein-calorie malnutrition, severe (HCC) 06/14/2013  . Symptomatic bradycardia 09/06/2012  . Pacemaker - Medtronic 2005, generator change September 2014 09/06/2012   Past Surgical History:  Procedure Laterality Date  . CARDIAC CATHETERIZATION  2002, 2003   normal coronaries  . EYE SURGERY  [age] y.o.  . GALLBLADDER SURGERY  2003  . HAND SURGERY  1998   x3  . OPEN REDUCTION INTERNAL FIXATION (ORIF) DISTAL RADIAL FRACTURE Right 09/07/2016   Procedure: RIGHT WRIST OPEN REDUCTION INTERNAL FIXATION (ORIF) REPAIR AS INDICATED;  Surgeon: Bradly Bienenstock, MD;  Location: [facility] OR;  Service: Orthopedics;  Laterality: Right;  . PACEMAKER INSERTION  04/23/2003   Medtronic Kappa; [facility] - symptomatic bradycardia  . PERMANENT PACEMAKER GENERATOR CHANGE N/A 09/06/2012   Procedure: PERMANENT PACEMAKER GENERATOR CHANGE;  Surgeon: Thurmon Fair, MD;  Location: [facility] CATH LAB;  Service: Cardiovascular;  Laterality: N/A;  . TONSILLECTOMY    . TRANSTHORACIC ECHOCARDIOGRAM  05/14/2012   EF 50-55%, normal systolic function; mild MR  (ordered for mitral valve disease 424.0)    Family History  Problem Relation Age of Onset  . Heart Problems Mother        MVP  . Heart disease Mother   . CAD Father   . COPD Father   . Heart disease Father   . Heart Problems Other        aunts x 2 died of heart problems   . Heart attack Maternal Grandmother   . Hypertension Maternal Grandmother   . Heart attack Paternal Grandmother   . Hypertension Paternal Grandmother   . Heart attack Paternal Grandfather   . Hypertension Paternal Grandfather   . Other Sister        crib dealth    Medications- reviewed and updated Current Outpatient Medications  Medication Sig Dispense Refill  . albuterol (ACCUNEB) 1.25 MG/3ML nebulizer solution USE 1 VIAL IN NEBULIZER EVERY 6 HOURS AS NEEDED FOR WHEEZING 150 mL 0  . budesonide-formoterol (SYMBICORT) 160-4.5 MCG/ACT inhaler budesonide-formoterol HFA 160 mcg-4.5 mcg/actuation aerosol inhaler  INHALE 2 PUFFS FIRST THING IN THE MORNING AND THEN ANOTHER 2 PUFFS ABOUT 12 HOURS LATER    . FLUoxetine (PROZAC) 40 MG capsule Take 1 capsule (40 mg total) by mouth daily. 90 capsule 3  . fluticasone (FLONASE) 50 MCG/ACT nasal spray Place 2 sprays into both nostrils as needed.    [name] ibuprofen (ADVIL) 800 MG tablet Take 800 mg by mouth every 8 (eight) hours as needed.    [name] losartan-hydrochlorothiazide (HYZAAR) 50-12.5 MG tablet Take 1 tablet by mouth daily. 90 tablet 3  . Multiple Vitamin (MULTIVITAMIN WITH MINERALS) TABS tablet Take 1 tablet by mouth daily.    . Tiotropium Bromide Monohydrate (SPIRIVA RESPIMAT) 2.5 MCG/ACT AERS Inhale 2 puffs into the lungs daily. 3 each 3  . VENTOLIN HFA 108 (90 Base) MCG/ACT inhaler Up to 2 pffs every 4 hours as needed 3 each 3  . Vitamin D, Ergocalciferol, (DRISDOL) 1.25 MG (50000 UNIT) CAPS capsule Take 1 capsule (50,000 Units total) by mouth every 7 (seven) days. 10 capsule 0  . omeprazole (PRILOSEC) 20 MG capsule Take 1 capsule (20 mg total) by mouth 2 (two) times  daily. 180 capsule 1   No current facility-administered medications for this visit.    Allergies-reviewed and updated Allergies  Allergen Reactions  . Shellfish Allergy Anaphylaxis    Social History   Socioeconomic History  . Marital status: Divorced    Spouse name: Not on file  . Number of children: 2  . Years of education: 10  . Highest education level: Not on file  Occupational History  . Occupation: Disabled  Tobacco Use  . Smoking status: Current Every Day Smoker    Packs/day: 1.00    Years: 40.00    Pack years: 40.00    Types: Cigarettes  . Smokeless tobacco: Never Used  Vaping Use  . Vaping Use: Never used  Substance and Sexual Activity  . Alcohol use: No  . Drug use: Never  . Sexual activity: Not Currently  Other Topics Concern  . Not on  file  Social History Narrative  . Not on file   Social Determinants of Health   Financial Resource Strain: Not on file  Food Insecurity: Not on file  Transportation Needs: Not on file  Physical Activity: Not on file  Stress: Not on file  Social Connections: Not on file          Objective:  Physical Exam: BP 105/67   Pulse 68   Temp 97.9 F (36.6 C) (Temporal)   Ht 5\' 9"  (1.753 m)   Wt 132 lb 9.6 oz (60.1 kg)   SpO2 96%   BMI 19.58 kg/m   Gen: No acute distress, resting comfortably CV: Regular rate and rhythm with no murmurs appreciated Pulm: Normal work of breathing, clear to auscultation bilaterally with no crackles, wheezes, or rhonchi Neuro: Grossly normal, moves all extremities Psych: Normal affect and thought content  Time Spent: 45 minutes of total time was spent on the date of the encounter performing the following actions: chart review prior to seeing the patient including recent visits with previous PCP, obtaining history, performing a medically necessary exam, counseling on the treatment plan, placing orders, and documenting in our EHR.        . Katina Degree, MD 04/13/2020 1:28 PM

## 2020-04-28 ENCOUNTER — Ambulatory Visit: Payer: Medicare HMO | Attending: Internal Medicine

## 2020-04-28 ENCOUNTER — Other Ambulatory Visit: Payer: Self-pay

## 2020-04-28 ENCOUNTER — Other Ambulatory Visit (HOSPITAL_BASED_OUTPATIENT_CLINIC_OR_DEPARTMENT_OTHER): Payer: Self-pay

## 2020-04-28 DIAGNOSIS — Z23 Encounter for immunization: Secondary | ICD-10-CM

## 2020-04-28 MED ORDER — PFIZER-BIONT COVID-19 VAC-TRIS 30 MCG/0.3ML IM SUSP
INTRAMUSCULAR | 0 refills | Status: DC
  Filled 2020-04-28: qty 0.3, 1d supply, fill #0

## 2020-04-28 NOTE — Progress Notes (Signed)
   Covid-19 Vaccination Clinic  Name:  DONDI AIME    MRN: [id number] DOB: 30-Aug-1960  04/28/2020  Mr. Earhart was observed post Covid-19 immunization for 15 minutes without incident. He was provided with Vaccine Information Sheet and instruction to access the V-Safe system.   Mr. Kipp was instructed to call 911 with any severe reactions post vaccine: [name] Difficulty breathing  . Swelling of face and throat  . A fast heartbeat  . A bad rash all over body  . Dizziness and weakness   Immunizations Administered    Name Date Dose VIS Date Route   PFIZER Comrnaty(Gray TOP) Covid-19 Vaccine 04/28/2020 11:50 AM 0.3 mL 12/12/2019 Intramuscular   Manufacturer: [employer], [employer]   Lot: [id number]   NDC: [phone]

## 2020-07-07 ENCOUNTER — Ambulatory Visit: Payer: Medicare HMO

## 2020-09-26 ENCOUNTER — Encounter: Payer: Self-pay | Admitting: Cardiology

## 2020-09-26 ENCOUNTER — Ambulatory Visit (INDEPENDENT_AMBULATORY_CARE_PROVIDER_SITE_OTHER): Payer: Medicare HMO | Admitting: Cardiology

## 2020-09-26 DIAGNOSIS — Z0000 Encounter for general adult medical examination without abnormal findings: Secondary | ICD-10-CM

## 2020-09-26 NOTE — Patient Instructions (Signed)
Health Maintenance, Male Adopting a healthy lifestyle and getting preventive care are important in promoting health and wellness. Ask your health care provider about: The right schedule for you to have regular tests and exams. Things you can do on your own to prevent diseases and keep yourself healthy. What should I know about diet, weight, and exercise? Eat a healthy diet  Eat a diet that includes plenty of vegetables, fruits, low-fat dairy products, and lean protein. Do not eat a lot of foods that are high in solid fats, added sugars, or sodium. Maintain a healthy weight Body mass index (BMI) is a measurement that can be used to identify possible weight problems. It estimates body fat based on height and weight. Your health care provider can help determine your BMI and help you achieve or maintain a healthy weight. Get regular exercise Get regular exercise. This is one of the most important things you can do for your health. Most adults should: Exercise for at least 150 minutes each week. The exercise should increase your heart rate and make you sweat (moderate-intensity exercise). Do strengthening exercises at least twice a week. This is in addition to the moderate-intensity exercise. Spend less time sitting. Even light physical activity can be beneficial. Watch cholesterol and blood lipids Have your blood tested for lipids and cholesterol at 59 years of age, then have this test every 5 years. You may need to have your cholesterol levels checked more often if: Your lipid or cholesterol levels are high. You are older than 59 years of age. You are at high risk for heart disease. What should I know about cancer screening? Many types of cancers can be detected early and may often be prevented. Depending on your health history and family history, you may need to have cancer screening at various ages. This may include screening for: Colorectal cancer. Prostate cancer. Skin cancer. Lung  cancer. What should I know about heart disease, diabetes, and high blood pressure? Blood pressure and heart disease High blood pressure causes heart disease and increases the risk of stroke. This is more likely to develop in people who have high blood pressure readings, are of African descent, or are overweight. Talk with your health care provider about your target blood pressure readings. Have your blood pressure checked: Every 3-5 years if you are 18-39 years of age. Every year if you are 40 years old or older. If you are between the ages of 65 and 75 and are a current or former smoker, ask your health care provider if you should have a one-time screening for abdominal aortic aneurysm (AAA). Diabetes Have regular diabetes screenings. This checks your fasting blood sugar level. Have the screening done: Once every three years after age 45 if you are at a normal weight and have a low risk for diabetes. More often and at a younger age if you are overweight or have a high risk for diabetes. What should I know about preventing infection? Hepatitis B If you have a higher risk for hepatitis B, you should be screened for this virus. Talk with your health care provider to find out if you are at risk for hepatitis B infection. Hepatitis C Blood testing is recommended for: Everyone born from 1945 through 1965. Anyone with known risk factors for hepatitis C. Sexually transmitted infections (STIs) You should be screened each year for STIs, including gonorrhea and chlamydia, if: You are sexually active and are younger than 59 years of age. You are older than 59 years   of age and your health care provider tells you that you are at risk for this type of infection. Your sexual activity has changed since you were last screened, and you are at increased risk for chlamydia or gonorrhea. Ask your health care provider if you are at risk. Ask your health care provider about whether you are at high risk for HIV.  Your health care provider may recommend a prescription medicine to help prevent HIV infection. If you choose to take medicine to prevent HIV, you should first get tested for HIV. You should then be tested every 3 months for as long as you are taking the medicine. Follow these instructions at home: Lifestyle Do not use any products that contain nicotine or tobacco, such as cigarettes, e-cigarettes, and chewing tobacco. If you need help quitting, ask your health care provider. Do not use street drugs. Do not share needles. Ask your health care provider for help if you need support or information about quitting drugs. Alcohol use Do not drink alcohol if your health care provider tells you not to drink. If you drink alcohol: Limit how much you have to 0-2 drinks a day. Be aware of how much alcohol is in your drink. In the [place] one drink equals one 12 oz bottle of beer (355 mL), one 5 oz glass of wine (148 mL), or one 1 oz glass of hard liquor (44 mL). General instructions Schedule regular health, dental, and eye exams. Stay current with your vaccines. Tell your health care provider if: You often feel depressed. You have ever been abused or do not feel safe at home. Summary Adopting a healthy lifestyle and getting preventive care are important in promoting health and wellness. Follow your health care provider's instructions about healthy diet, exercising, and getting tested or screened for diseases. Follow your health care provider's instructions on monitoring your cholesterol and blood pressure. This information is not intended to replace advice given to you by your health care provider. Make sure you discuss any questions you have with your health care provider. Document Revised: 02/28/2020 Document Reviewed: 12/13/2017 Elsevier Patient Education  2022 Elsevier Inc.  

## 2020-09-26 NOTE — Progress Notes (Signed)
Subjective:   Michael [name] is a 60 y.o. male who presents for an Initial Medicare Annual Wellness Visit.  I connected with  Michael [name] on 09/26/20 by a venabled telemedicine application and verified that I am speaking with the correct person using two identifiers.   I discussed the limitations of evaluation and management by telemedicine. The patient expressed understanding and agreed to proceed.   Location of Patient: patient was not on call, Mom- Michael [name], POA Location of Provider: Office Persons participating in visit: Mom Michael [name], [place], patient was sleeping     Review of Systems    Defer to PCP Cardiac Risk Factors include: advanced age (>21men, >10 women);male gender;sedentary lifestyle     Objective:    There were no vitals filed for this visit. There is no height or weight on file to calculate BMI.  Advanced Directives 09/26/2020 03/17/2017 03/16/2017 09/07/2016 09/07/2016 04/21/2016 07/09/2013  Does Patient Have a Medical Advance Directive? Yes Yes No Yes Yes No Patient does not have advance directive  Type of Advertising copywriter - Healthcare Power of [employer] Power of [place];Living will - -  Does patient want to make changes to medical advance directive? No - Patient declined - - No - Patient declined No - Patient declined - -  Copy of Healthcare Power of Attorney in Chart? No - copy requested No - copy requested - No - copy requested No - copy requested - -  Some encounter information is confidential and restricted. Go to Review Flowsheets activity to see all data.    Current Medications (verified) Outpatient Encounter Medications as of 09/26/2020  Medication Sig   albuterol (ACCUNEB) 1.25 MG/3ML nebulizer solution USE 1 VIAL IN NEBULIZER EVERY 6 HOURS AS NEEDED FOR WHEEZING   budesonide-formoterol (SYMBICORT) 160-4.5 MCG/ACT inhaler budesonide-formoterol HFA 160 mcg-4.5 mcg/actuation aerosol  inhaler  INHALE 2 PUFFS FIRST THING IN THE MORNING AND THEN ANOTHER 2 PUFFS ABOUT 12 HOURS LATER   FLUoxetine (PROZAC) 40 MG capsule Take 1 capsule (40 mg total) by mouth daily.   fluticasone (FLONASE) 50 MCG/ACT nasal spray Place 2 sprays into both nostrils as needed.   ibuprofen (ADVIL) 800 MG tablet Take 800 mg by mouth every 8 (eight) hours as needed.   losartan-hydrochlorothiazide (HYZAAR) 50-12.5 MG tablet Take 1 tablet by mouth daily.   Multiple Vitamin (MULTIVITAMIN WITH MINERALS) TABS tablet Take 1 tablet by mouth daily.   omeprazole (PRILOSEC) 20 MG capsule Take 1 capsule (20 mg total) by mouth 2 (two) times daily.   Tiotropium Bromide Monohydrate (SPIRIVA RESPIMAT) 2.5 MCG/ACT AERS Inhale 2 puffs into the lungs daily.   VENTOLIN HFA 108 (90 Base) MCG/ACT inhaler Up to 2 pffs every 4 hours as needed   Vitamin D, Ergocalciferol, (DRISDOL) 1.25 MG (50000 UNIT) CAPS capsule Take 1 capsule (50,000 Units total) by mouth every 7 (seven) days.   COVID-19 mRNA Vac-TriS, Pfizer, (PFIZER-BIONT COVID-19 VAC-TRIS) SUSP injection Inject into the muscle. (Patient not taking: Reported on 09/26/2020)   No facility-administered encounter medications on file as of 09/26/2020.    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   GALLBLADDER SURGERY  2003   HAND SURGERY  1998   x3   OPEN REDUCTION INTERNAL FIXATION (ORIF) DISTAL RADIAL FRACTURE Right 09/07/2016   Procedure: RIGHT WRIST OPEN REDUCTION INTERNAL FIXATION (ORIF) REPAIR AS INDICATED;  Surgeon: Bradly Bienenstock, MD;  Location: [facility] OR;  Service: Orthopedics;  Laterality: Right;   PACEMAKER INSERTION  04/23/2003   [employer]; [facility] - symptomatic bradycardia   PERMANENT PACEMAKER GENERATOR CHANGE N/A 09/06/2012   Procedure: PERMANENT PACEMAKER GENERATOR CHANGE;  Surgeon: Thurmon Fair, MD;  Location: [facility] CATH LAB;  Service: Cardiovascular;  Laterality: N/A;   TONSILLECTOMY     TRANSTHORACIC ECHOCARDIOGRAM  05/14/2012   EF 50-55%, normal systolic function; mild MR (ordered for mitral valve disease 424.0)   Family History  Problem Relation Age of Onset   Heart Problems Mother        MVP   Heart disease Mother    CAD Father    COPD Father    Heart disease Father    Other Sister        crib dealth   Heart attack Maternal Grandmother    Hypertension Maternal Grandmother    Heart attack Paternal Grandmother    Hypertension Paternal Grandmother    Heart attack Paternal Grandfather    Hypertension Paternal Grandfather    Heart Problems Other        aunts x 2 died of heart problems    Social History   Socioeconomic History   Marital status: Divorced    Spouse name: Not on file   Number of children: 2   Years of education: 12   Highest education level: Not on file  Occupational History   Occupation: Disabled  Tobacco Use   Smoking status: Every Day    Packs/day: 1.00    Years: 40.00    Pack years: 40.00    Types: Cigarettes   Smokeless tobacco: Never  Vaping Use   Vaping Use: Never used  Substance and Sexual Activity   Alcohol use: No   Drug use: Never   Sexual activity: Not  Currently  Other Topics Concern   Not on file  Social History Narrative   Not on file   Social Determinants of Health   Financial Resource Strain: Low Risk    Difficulty of Paying Living Expenses: Not hard at all  Food Insecurity: No Food Insecurity   Worried About Programme researcher, broadcasting/film/video in the Last Year: Never true   Ran Out of Food in the Last Year: Never true  Transportation Needs: No Transportation Needs   Lack of Transportation (Medical): No   Lack of Transportation (Non-Medical): No  Physical Activity: Inactive   Days of Exercise per Week: 0 days   Minutes of Exercise per Session: 0 min  Stress: Stress Concern Present  Feeling of Stress : Rather much  Social Connections: Moderately Isolated   Frequency of Communication with Friends and Family: More than three times a week   Frequency of Social Gatherings with Friends and Family: Twice a week   Attends Religious Services: Never   Database administrator or Organizations: Yes   Attends Banker Meetings: Never   Marital Status: Divorced    Tobacco Counseling Ready to quit: Not Answered Counseling given: Not Answered   Clinical Intake:  Pre-visit preparation completed: Yes  Pain : No/denies pain     Nutritional Risks: None Diabetes: No  How often do you need to have someone help you when you read instructions, pamphlets, or other written materials from your doctor or pharmacy?: 1 - Never What is the last grade level you completed in school?: 12  Diabetic?No  Interpreter Needed?: No  Information entered by :: Janne Napoleon, RN   Activities of Daily Living In your present state of health, do you have any difficulty performing the following activities: 09/26/2020 11/11/2019  Hearing? Y N  Vision? N N  Difficulty concentrating or making decisions? Malvin Johns  Walking or climbing stairs? Y N  Comment gets SOB -  Dressing or bathing? N N  Doing errands, shopping? N N  Preparing Food and eating ? N -   Using the Toilet? N -  Do you have problems with loss of bowel control? N -  Managing your Medications? Y -  Comment mom helps with these -  Managing your Finances? Y -  Comment mom helps with this -  Housekeeping or managing your Housekeeping? Y -  Comment mom helps -  Some recent data might be hidden    Patient Care Team: [name], MD as PCP - General (Family Medicine) Croitoru, [name], MD as Consulting Physician (Cardiology) [name], MD as Consulting Physician (Pulmonary Disease)  Indicate any recent Medical Services you may have received from other than [facility] providers in the past year (date may be approximate).     Assessment:   This is a routine wellness examination for [place].  Hearing/Vision screen No results found.  Dietary issues and exercise activities discussed: Current Exercise Habits: The patient does not participate in regular exercise at present, Exercise limited by: respiratory conditions(s)   Goals Addressed   None   Depression Screen PHQ 2/9 Scores 09/26/2020 09/26/2020 01/05/2016 07/28/2015 01/14/2014 11/14/2013 09/02/2013  PHQ - 2 Score 0 0 6 0 0 0 0  PHQ- 9 Score 15 - 24 - - - -    Fall Risk Fall Risk  09/26/2020 11/11/2019 01/05/2016 07/28/2015 01/14/2014  Falls in the past year? 0 0 No No No  Number falls in past yr: 0 0 - - -  Injury with Fall? 1 0 - - -  Risk for fall due to : No Fall Risks - - - -  Follow up - Falls evaluation completed - - -    FALL RISK PREVENTION PERTAINING TO THE HOME:  Any stairs in or around the home? Yes  If so, are there any without handrails? Yes  Home free of loose throw rugs in walkways, pet beds, electrical cords, etc? No  Adequate lighting in your home to reduce risk of falls? Yes   ASSISTIVE DEVICES UTILIZED TO PREVENT FALLS:  Life alert? No  Use of a cane, walker or w/c? No  Grab bars in the bathroom? No  Shower chair or bench in shower? No  Elevated toilet  seat or a handicapped toilet? No   TIMED UP  AND GO:  Was the test performed?  N/A .  Length of time to ambulate 10 feet: N/A sec.     Cognitive Function:        Immunizations Immunization History  Administered Date(s) Administered   Influenza, High Dose Seasonal PF 10/11/2017   Influenza, Quadrivalent, Recombinant, Inj, Pf 11/02/2018   Influenza,inj,Quad PF,6+ Mos 09/02/2013, 08/25/2015, 10/29/2016   Influenza,inj,quad, With Preservative 11/02/2018   Influenza-Unspecified 09/23/2019   PFIZER Comirnaty(Gray Top)Covid-19 Tri-Sucrose Vaccine 04/28/2020   PFIZER(Purple Top)SARS-COV-2 Vaccination 03/21/2019, 04/15/2019, 11/02/2019   Pneumococcal Polysaccharide-23 06/15/2013   Tdap 07/09/2013, 12/10/2019   Zoster Recombinat (Shingrix) 09/23/2019    TDAP status: Up to date  Flu Vaccine status: Due, Education has been provided regarding the importance of this vaccine. Advised may receive this vaccine at local pharmacy or Health Dept. Aware to provide a copy of the vaccination record if obtained from local pharmacy or Health Dept. Verbalized acceptance and understanding.  Pneumococcal vaccine status: Up to date  Covid-19 vaccine status: Completed vaccines  Qualifies for Shingles Vaccine? Yes   Zostavax completed No   Shingrix Completed?: No.    Education has been provided regarding the importance of this vaccine. Patient has been advised to call insurance company to determine out of pocket expense if they have not yet received this vaccine. Advised may also receive vaccine at local pharmacy or Health Dept. Verbalized acceptance and understanding.  Screening Tests Health Maintenance  Topic Date Due   Zoster Vaccines- Shingrix (2 of 2) 11/18/2019   INFLUENZA VACCINE  08/03/2020   COVID-19 Vaccine (5 - Booster for Pfizer series) 08/28/2020   COLONOSCOPY (Pts 45-75yrs Insurance coverage will need to be confirmed)  07/10/2023   TETANUS/TDAP  12/09/2029   Hepatitis C Screening  Completed   HIV Screening  Completed   HPV  VACCINES  Aged Out    Health Maintenance  Health Maintenance Due  Topic Date Due   Zoster Vaccines- Shingrix (2 of 2) 11/18/2019   INFLUENZA VACCINE  08/03/2020   COVID-19 Vaccine (5 - Booster for Pfizer series) 08/28/2020    Colorectal cancer screening: Type of screening: Colonoscopy. Completed 2015. Repeat every 10 years  Lung Cancer Screening: (Low Dose CT Chest recommended if Age 22-80 years, 30 pack-year currently smoking OR have quit w/in 15years.) does qualify.   Lung Cancer Screening Referral: No, will ask Dr Sherene Sires  Additional Screening:  Hepatitis C Screening: does qualify; Completed 2016  Vision Screening: Recommended annual ophthalmology exams for early detection of glaucoma and other disorders of the eye. Is the patient up to date with their annual eye exam?  Yes  Not yearly Who is the provider or what is the name of the office in which the patient attends annual eye exams? Dr Yetta Barre If pt is not established with a provider, would they like to be referred to a provider to establish care? No .   Dental Screening: Recommended annual dental exams for proper oral hygiene  Community Resource Referral / Chronic Care Management: CRR required this visit?  No   CCM required this visit?  No      Plan:     I have personally reviewed and noted the following in the patient's chart:   Medical and social history Use of alcohol, tobacco or illicit drugs  Current medications and supplements including opioid prescriptions. Patient is not currently taking opioid prescriptions. Functional ability and status Nutritional status Physical activity Advanced directives List  of other physicians Hospitalizations, surgeries, and ER visits in previous 12 months Vitals Screenings to include cognitive, depression, and falls Referrals and appointments  In addition, I have reviewed and discussed with patient certain preventive protocols, quality metrics, and best practice  recommendations. A written personalized care plan for preventive services as well as general preventive health recommendations were provided to patient.  Patient see Dr Jimmey Ralph yearly unless problems, due to see April 2023.  Mom-Brenda Keener, POA cares for patient, she picks medication up, takes him to Dr when needed.  She will check with [facility] to see if patient has received the 2nd shingles vaccine, she will get the patient the flu shot, 2nd Covid booster shot.     Janne Napoleon, RN   09/26/2020   Nurse Notes:   Non-Face to Face 45 minute visit.   Mr. Blackerby , Thank you for taking time to come for your Medicare Wellness Visit. I appreciate your ongoing commitment to your health goals. Please review the following plan we discussed and let me know if I can assist you in the future.   These are the goals we discussed:  Goals   None     This is a list of the screening recommended for you and due dates:  Health Maintenance  Topic Date Due   Zoster (Shingles) Vaccine (2 of 2) 11/18/2019   Flu Shot  08/03/2020   COVID-19 Vaccine (5 - Booster for Pfizer series) 08/28/2020   Colon Cancer Screening  07/10/2023   Tetanus Vaccine  12/09/2029   Hepatitis C Screening: USPSTF Recommendation to screen - Ages 18-79 yo.  Completed   HIV Screening  Completed   HPV Vaccine  Aged Out

## 2020-10-06 ENCOUNTER — Other Ambulatory Visit: Payer: Self-pay

## 2020-10-06 ENCOUNTER — Ambulatory Visit: Payer: Medicare HMO | Admitting: Internal Medicine

## 2020-10-06 ENCOUNTER — Ambulatory Visit (INDEPENDENT_AMBULATORY_CARE_PROVIDER_SITE_OTHER): Payer: Medicare HMO

## 2020-10-06 ENCOUNTER — Encounter: Payer: Self-pay | Admitting: Internal Medicine

## 2020-10-06 VITALS — BP 106/70 | HR 81 | Temp 97.4°F | Ht 69.0 in | Wt 135.0 lb

## 2020-10-06 DIAGNOSIS — J449 Chronic obstructive pulmonary disease, unspecified: Secondary | ICD-10-CM

## 2020-10-06 DIAGNOSIS — Z23 Encounter for immunization: Secondary | ICD-10-CM | POA: Diagnosis not present

## 2020-10-06 DIAGNOSIS — F17210 Nicotine dependence, cigarettes, uncomplicated: Secondary | ICD-10-CM

## 2020-10-06 DIAGNOSIS — I495 Sick sinus syndrome: Secondary | ICD-10-CM

## 2020-10-06 DIAGNOSIS — J439 Emphysema, unspecified: Secondary | ICD-10-CM | POA: Diagnosis not present

## 2020-10-06 DIAGNOSIS — R059 Cough, unspecified: Secondary | ICD-10-CM | POA: Diagnosis not present

## 2020-10-06 NOTE — Assessment & Plan Note (Signed)
Active smoker - reports quit smokng 06/2013> resumed by ov 04/11/2016   - Spirometry 08/19/2013  FEV1 1.85 ( 47%) ratio 44  - Spirometry 12/08/2015   FEV1 1.53 (43%)  Ratio 40 off maint rx  - 12/08/2015  After extensive coaching HFA effectiveness =    75% > start symb 160/atrovent qid   - Allergy profile 12/08/15  >  Eos 0.0 /  IgE  347  RAST pos dust/ mold - [facility] AT screen 12/08/15 > MM/ levels ok   - 10/11/2017    changed to spiriva smi   - 10/04/2019  After extensive coaching inhaler device,  effectiveness =    75% (short Ti) - rec empty device use to practice prior to daily hfa/smi use   Group D in terms of symptom/risk and laba/lama/ICS  therefore appropriate rx at this point >>>  Symb/spiriva adequate plus prn saba  Re SABA :  I spent extra time with pt today reviewing appropriate use of albuterol for prn use on exertion with the following points: 1) saba is for relief of sob that does not improve by walking a slower pace or resting but rather if the pt does not improve after trying this first. 2) If the pt is convinced, as many are, that saba helps recover from activity faster then it's easy to tell if this is the case by re-challenging : ie stop, take the inhaler, then p 5 minutes try the exact same activity (intensity of workload) that just caused the symptoms and see if they are substantially diminished or not after saba 3) if there is an activity that reproducibly causes the symptoms, try the saba 15 min before the activity on alternate days   If in fact the saba really does help, then fine to continue to use it prn but advised may need to look closer at the maintenance regimen being used to achieve better control of airways disease with exertion.

## 2020-10-06 NOTE — Patient Instructions (Signed)
The key is to stop smoking completely before smoking completely stops you!  Ok to try albuterol 15 min before an activity (on alternating days)  that you know would usually make you short of breath and see if it makes any difference and if makes none then don't take albuterol after activity unless you can't catch your breath as this means it's the resting that helps, not the albuterol.      Please remember to go to the  x-ray department  for your tests - we will call you with the results when they are available     Please schedule a follow up visit in 6 months but call sooner if needed

## 2020-10-06 NOTE — Assessment & Plan Note (Signed)
Counseled re importance of smoking cessation but did not meet time criteria for separate billing           Each maintenance medication was reviewed in detail including emphasizing most importantly the difference between maintenance and prns and under what circumstances the prns are to be triggered using an action plan format where appropriate.  Total time for H and P, chart review, counseling, reviewing hfa/smi device(s) and generating customized AVS unique to this office visit / same day charting = 25 min       

## 2020-10-06 NOTE — Progress Notes (Signed)
Subjective:     Patient ID: Michael [name], male   DOB: [birth year]      MRN: [id number]    Brief patient profile:  81  yowm MM active smoker referred to pulmonary clinic 12/08/2015 by Dr  Ilsa Iha with GOLD III copd documented 08/19/13     History of Present Illness  08/19/13 ov / Michael [name] GOLD III copd  rec Continue symbicort 160 Take 2 puffs first thing in am and then another 2 puffs about 12 hours later.  Only use your  atrovent as a rescue medication Work on inhaler technique:     12/08/2015  Re-establish/ extended  Pulmonary office visit/ Michael [name]  maint rx qvar / atrovent prn Chief Complaint  Patient presents with   Pulm Consult    Pt. has a history of COPD.   breathing gradually worse x sev months / assoc with coughing > mostly white  rx zpak/ prednisone x 12/04/15 no better  Doe = MMRC3 = can't walk 100 yards even at a slow pace at a flat grade s stopping due to sob   rec Plan A = Automatic = Symbicort 160 Take 2 puffs first thing in am and then another 2 puffs about 12 hours later.                                      Atrovent 2 puffs four times day  Plan B = Backup Only use your albuterol (Ventolin)as a rescue medication  Plan C = Crisis - only use your albuterol nebulizer if you first try Plan B  .     07/11/2017  f/u ov/Michael [name] re:  GOLD III copd / still smoking  Chief Complaint  Patient presents with   Follow-up    Breathing is about the same. He has good days and bad. Still smoking 1 ppd.    Dyspnea:  MMRC3 = can't walk 100 yards even at a slow pace at a flat grade s stopping due to sob  / still able to do grocery shopping Cough: worse the more he smokes  Sleeping: variably prop up  SABA use: variable depending on activity  02: none needed  rec Plan A = Automatic = symbicort 160 x 2pff in am followed by Spiriva and then symbicort x 2 puff 12 hours later Work on inhaler technique:  Plan B = Backup Only use your albuterol as a rescue medication  Plan C = Crisis - only use your  albuterol nebulizer if you first try Plan B The key is to stop smoking completely before smoking completely stops you! Please schedule a follow up visit in 3 months but call sooner if needed  with all medications /inhalers/ solutions in hand so we can verify exactly what you are taking. This includes all medications from all doctors and over the counters    10/11/2017  f/u ov/Michael [name] re: COPD III/ still smoking maint on symb/ spiriva dpi  Chief Complaint  Patient presents with   Follow-up    Breathing "depends on the heat outside"- he is unsure of meds and states "needs refills on everything".    Dyspnea:  MMRC3 = can't walk 100 yards even at a slow pace at a flat grade s stopping due to sob   Cough: provoked  with active smoking and ? From dpi spiriva also/ min mucoid  Sleeping: bed flat with a couple of pillows  SABA use:  way too much when weather hot  02: none  rec Change spiriva to Respimat form  and use 2 pffs first thing in am only to follow the symbicort x 2 puffs only in the am, then take the other 2 pffs of symbicort 12 hours later  Work on inhaler technique:  The key is to stop smoking completely before smoking completely stops you!  Please schedule a follow up visit in 3 months but call sooner if needed  with all medications     01/12/2018  f/u ov/Michael [name] re:  COPD  GOLD III / still smoking  Chief Complaint  Patient presents with   Follow-up    Breathing is unchanged. He is using his albuterol inhaler approx 10 x per day. He rarely uses his neb.   Dyspnea:  MMRC3 = can't walk 100 yards even at a slow pace at a flat grade s stopping due to sob   Cough: smoking rattle  Sleeping: bed flat / 2 pillows  SABA use: [place] rec Only use your albuterol as a rescue medication to be used if you can't catch your breath The key is to stop smoking completely before smoking completely stops you! For smoking cessation classes call [phone]    Please schedule a follow up visit in 3 months but  call sooner if needed  with all medications /inhalers/ solutions in hand so we can verify exactly what you are taking. This includes all medications from all doctors and over the counters   02/19/2019  f/u ov/Michael [name] re: GOLD III still smoking extremely poor insight re meds  Chief Complaint  Patient presents with   Follow-up    28yr f/u for COPD. States his nebulizer has stopped working. Has not new supplies.    Dyspnea:  MMRC3 = can't walk 100 yards even at a slow pace at a flat grade s stopping due to sob  Can't do shopping at [employer] Cough: clear mucus  Sleeping: nightly uses saba but poor baseline hfa  SABA use: way too much since neb stopped working  02: none  rec Work on inhaler technique:   The key is to stop smoking completely before smoking completely stops you!   10/04/2019  f/u ov/Michael [name] re:  GOLD III / still smoking/ maint on symb/ spiriva but taking  pm dose  sym at hs  Chief Complaint  Patient presents with   Follow-up    pt is here for refills on meds  Dyspnea:  One aisle s stopping at [employer] (note not reproducible in office)  Cough: up to an hour / min mucoid tsp or two Sleeping: hip stops him / bed is flat with 2-3 pillows  SABA use: way too much with activity/ never pre challenges  02: none  Rec Plan A = Automatic = Always=    Symbicort 160 x 2 puffs 1st thing in am and 12 hours later and spiriva is 2 pffs each am   - remember to take the practice "swings" like a golfer before using any of these devices  Plan B = Backup (to supplement plan A, not to replace it) Only use your albuterol inhaler as a rescue medication Plan C = Crisis (instead of Plan B but only if Plan B stops working) - only use your albuterol nebulizer if you first try Plan B and it fails to help  The key is to stop smoking completely before smoking completely stops you!    10/06/2020  f/u ov/Michael [name] re: copd 3 still smoking  maint on symbicort / spiriva   Chief Complaint  Patient presents with    Follow-up    Patient reports he is about the same and is still smoking.   Dyspnea:  MMRC2 = can't walk a nl pace on a flat grade s sob but does fine slow and flat leaning on cart / walks across parking lot  Cough: min mucoid / rattling worse in am's   Sleeping: wakes up most nights due to cough > sob >>>  rarely saba noct  SABA use: highly variable 02: none  Covid status:   vax x 4    No obvious day to day or daytime variability or assoc excess/ purulent sputum or mucus plugs or hemoptysis or cp or chest tightness, subjective wheeze or overt sinus or hb symptoms.     Also denies any obvious fluctuation of symptoms with weather or environmental changes or other aggravating or alleviating factors except as outlined above   No unusual exposure hx or h/o childhood pna/ asthma or knowledge of premature birth.  Current Allergies, Complete Past Medical History, Past Surgical History, Family History, and Social History were reviewed in [employer] record.  ROS  The following are not active complaints unless bolded Hoarseness, sore throat, dysphagia, dental problems, itching, sneezing,  nasal congestion or discharge of excess mucus or purulent secretions, ear ache,   fever, chills, sweats, unintended wt loss or wt gain, classically pleuritic or exertional cp,  orthopnea pnd or arm/hand swelling  or leg swelling, presyncope, palpitations, abdominal pain, anorexia, nausea, vomiting, diarrhea  or change in bowel habits or change in bladder habits, change in stools or change in urine, dysuria, hematuria,  rash, arthralgias, visual complaints, headache, numbness, weakness or ataxia or problems with walking or coordination,  change in mood or  memory.        Current Meds  Medication Sig   albuterol (ACCUNEB) 1.25 MG/3ML nebulizer solution USE 1 VIAL IN NEBULIZER EVERY 6 HOURS AS NEEDED FOR WHEEZING   budesonide-formoterol (SYMBICORT) 160-4.5 MCG/ACT inhaler budesonide-formoterol HFA  160 mcg-4.5 mcg/actuation aerosol inhaler  INHALE 2 PUFFS FIRST THING IN THE MORNING AND THEN ANOTHER 2 PUFFS ABOUT 12 HOURS LATER   FLUoxetine (PROZAC) 40 MG capsule Take 1 capsule (40 mg total) by mouth daily.   fluticasone (FLONASE) 50 MCG/ACT nasal spray Place 2 sprays into both nostrils as needed.   ibuprofen (ADVIL) 800 MG tablet Take 800 mg by mouth every 8 (eight) hours as needed.   losartan-hydrochlorothiazide (HYZAAR) 50-12.5 MG tablet Take 1 tablet by mouth daily.   Multiple Vitamin (MULTIVITAMIN WITH MINERALS) TABS tablet Take 1 tablet by mouth daily.   omeprazole (PRILOSEC) 20 MG capsule Take 1 capsule (20 mg total) by mouth 2 (two) times daily.   Tiotropium Bromide Monohydrate (SPIRIVA RESPIMAT) 2.5 MCG/ACT AERS Inhale 2 puffs into the lungs daily.   VENTOLIN HFA 108 (90 Base) MCG/ACT inhaler Up to 2 pffs every 4 hours as needed   Vitamin D, Ergocalciferol, (DRISDOL) 1.25 MG (50000 UNIT) CAPS capsule Take 1 capsule (50,000 Units total) by mouth every 7 (seven) days.                      Objective:   Physical Exam     10/06/2020          135  10/04/2019         126  02/19/2019         137  01/12/2018  137  10/11/2017       131  07/11/2017         125  01/11/2017         139  07/11/2016         144   04/11/2016        147          12/08/15 157 lb 3.2 oz (71.3 kg)  11/04/15 153 lb 12.8 oz (69.8 kg)  08/25/15 160 lb 12.8 oz (72.9 kg)    Vital signs reviewed  10/06/2020  - Note at rest 02 sats  96% on RA   General appearance:    disheveled amb thin wm nad       Edentulous  By record  HEENT : pt wearing mask not removed for exam due to covid -19 concerns.    NECK :  without JVD/Nodes/TM/ nl carotid upstrokes bilaterally   LUNGS: no acc muscle use,  Mod barrel  contour chest wall with bilateral  Distant bs s audible wheeze and  without cough on insp or exp maneuvers and mod  Hyperresonant  to  percussion bilaterally     CV:  RRR  no s3 or murmur or increase in P2,  and no edema   ABD:  soft and nontender with pos mid insp Hoover's  in the supine position. No bruits or organomegaly appreciated, bowel sounds nl  MS:     ext warm without deformities, calf tenderness, cyanosis or clubbing No obvious joint restrictions   SKIN: warm and dry without lesions    NEURO:  alert, approp, nl sensorium with  no motor or cerebellar deficits apparent.         CXR PA and Lateral:   10/06/2020 :    I personally reviewed images  / impression as follows:    Severe copd changes/ nothing acute  Assessment:

## 2020-10-07 LAB — CUP PACEART REMOTE DEVICE CHECK
Battery Impedance: 729 Ohm
Battery Remaining Longevity: 87 mo
Battery Voltage: 2.77 V
Brady Statistic AP VP Percent: 0 %
Brady Statistic AP VS Percent: 44 %
Brady Statistic AS VP Percent: 1 %
Brady Statistic AS VS Percent: 55 %
Date Time Interrogation Session: 20221005122647
Implantable Lead Implant Date: 20050421
Implantable Lead Implant Date: 20050421
Implantable Lead Location: 753859
Implantable Lead Location: 753860
Implantable Lead Model: 5076
Implantable Lead Model: 5076
Implantable Pulse Generator Implant Date: 20140904
Lead Channel Impedance Value: 451 Ohm
Lead Channel Impedance Value: 576 Ohm
Lead Channel Pacing Threshold Amplitude: 0.75 V
Lead Channel Pacing Threshold Amplitude: 0.75 V
Lead Channel Pacing Threshold Pulse Width: 0.4 ms
Lead Channel Pacing Threshold Pulse Width: 0.4 ms
Lead Channel Setting Pacing Amplitude: 1.5 V
Lead Channel Setting Pacing Amplitude: 2 V
Lead Channel Setting Pacing Pulse Width: 0.4 ms
Lead Channel Setting Sensing Sensitivity: 5.6 mV

## 2020-10-14 NOTE — Progress Notes (Signed)
Remote pacemaker transmission.   

## 2020-11-11 ENCOUNTER — Other Ambulatory Visit: Payer: Self-pay | Admitting: Internal Medicine

## 2020-11-11 ENCOUNTER — Other Ambulatory Visit: Payer: Self-pay | Admitting: Family Medicine

## 2020-11-11 DIAGNOSIS — K219 Gastro-esophageal reflux disease without esophagitis: Secondary | ICD-10-CM

## 2020-11-17 ENCOUNTER — Telehealth: Payer: Self-pay | Admitting: Internal Medicine

## 2020-11-17 MED ORDER — SPIRIVA RESPIMAT 2.5 MCG/ACT IN AERS
2.0000 | INHALATION_SPRAY | Freq: Every day | RESPIRATORY_TRACT | 3 refills | Status: DC
Start: 2020-11-17 — End: 2021-04-15

## 2020-11-17 NOTE — Telephone Encounter (Signed)
Rx has been sent to the pharmacy per pts request.  Nothing further is needed.

## 2020-11-30 ENCOUNTER — Other Ambulatory Visit: Payer: Self-pay | Admitting: Family Medicine

## 2020-11-30 DIAGNOSIS — K219 Gastro-esophageal reflux disease without esophagitis: Secondary | ICD-10-CM

## 2021-03-09 ENCOUNTER — Other Ambulatory Visit: Payer: Self-pay | Admitting: Internal Medicine

## 2021-03-09 ENCOUNTER — Other Ambulatory Visit: Payer: Self-pay | Admitting: Adult Health

## 2021-04-06 ENCOUNTER — Ambulatory Visit: Payer: Medicare HMO | Admitting: Internal Medicine

## 2021-04-14 DIAGNOSIS — M79672 Pain in left foot: Secondary | ICD-10-CM | POA: Diagnosis not present

## 2021-04-15 ENCOUNTER — Ambulatory Visit (INDEPENDENT_AMBULATORY_CARE_PROVIDER_SITE_OTHER): Payer: Medicare HMO

## 2021-04-15 ENCOUNTER — Ambulatory Visit: Payer: Medicare HMO | Admitting: Internal Medicine

## 2021-04-15 ENCOUNTER — Encounter: Payer: Self-pay | Admitting: Internal Medicine

## 2021-04-15 DIAGNOSIS — J9612 Chronic respiratory failure with hypercapnia: Secondary | ICD-10-CM

## 2021-04-15 DIAGNOSIS — J449 Chronic obstructive pulmonary disease, unspecified: Secondary | ICD-10-CM

## 2021-04-15 DIAGNOSIS — F17210 Nicotine dependence, cigarettes, uncomplicated: Secondary | ICD-10-CM | POA: Diagnosis not present

## 2021-04-15 DIAGNOSIS — R0602 Shortness of breath: Secondary | ICD-10-CM | POA: Diagnosis not present

## 2021-04-15 MED ORDER — ALBUTEROL SULFATE 1.25 MG/3ML IN NEBU: INHALATION_SOLUTION | RESPIRATORY_TRACT | 0 refills | Status: DC

## 2021-04-15 MED ORDER — SYMBICORT 160-4.5 MCG/ACT IN AERO
INHALATION_SPRAY | RESPIRATORY_TRACT | 3 refills | Status: DC
Start: 2021-04-15 — End: 2021-10-02

## 2021-04-15 MED ORDER — SPIRIVA RESPIMAT 2.5 MCG/ACT IN AERS: 2.0000 | INHALATION_SPRAY | Freq: Every day | RESPIRATORY_TRACT | 3 refills | Status: DC

## 2021-04-15 MED ORDER — VENTOLIN HFA 108 (90 BASE) MCG/ACT IN AERS: INHALATION_SPRAY | RESPIRATORY_TRACT | 3 refills | Status: DC

## 2021-04-15 NOTE — Assessment & Plan Note (Signed)
HCO3 12/08/2015 = 34  ?HC03   03/18/17    = 27 ?-   10/04/2019   Walked RA  3 laps @ approx 231ft each @ nl pace  stopped due to end of study,  Mild sob at end with sats still 95%  ?- HC03  01/27/20  = 32  ? ?Well compensated on RA, no changes needed  ?

## 2021-04-15 NOTE — Patient Instructions (Addendum)
The key is to stop smoking completely before smoking completely stops you! ? ?Work on inhaler technique:  relax and gently blow all the way out then take a nice smooth full deep breath back in, triggering the inhaler at same time you start breathing in.  Hold for up to 5 seconds if you can. Blow out thru nose. Rinse and gargle with water when done.  If mouth or throat bother you at all,  try brushing teeth/gums/tongue with arm and hammer toothpaste/ make a slurry and gargle and spit out.  ? ?- remember how golfers take practice swings.  ? ?Please remember to go to the  x-ray department  for your tests - we will call you with the results when they are available   ? ?I will also be referring you for lung cancer screening to get started this year.  ? ? ?Please schedule a follow up visit in 6 months but call sooner if needed - bring all inhalers - ok for [place] [facility].  ? ?

## 2021-04-15 NOTE — Assessment & Plan Note (Signed)
Active smoker ?- reports quit smokng 06/2013> resumed by ov 04/11/2016  ? - Spirometry 08/19/2013  FEV1 1.85 ( 47%) ratio 44  ?- Spirometry 12/08/2015   FEV1 1.53 (43%)  Ratio 40 off maint rx  ?- 12/08/2015  After extensive coaching HFA effectiveness =    75% > start symb 160/atrovent qid   ?- Allergy profile 12/08/15  >  Eos 0.0 /  IgE  347  RAST pos dust/ mold ?- [facility] AT screen 12/08/15 > MM/ levels ok   ?- 10/11/2017    changed to spiriva smi   ?- 04/15/2021  After extensive coaching inhaler device,  effectiveness =    80%  With hfa  ? ? ? Group D (= E with the newest guidelines)  in terms of symptom/risk and laba/lama/ICS  therefore appropriate rx at this point >>>  Continue symbicort/ spiriva and appropr saba ? ?Re SABA :  I spent extra time with pt today reviewing appropriate use of albuterol for prn use on exertion with the following points: ?1) saba is for relief of sob that does not improve by walking a slower pace or resting but rather if the pt does not improve after trying this first. ?2) If the pt is convinced, as many are, that saba helps recover from activity faster then it's easy to tell if this is the case by re-challenging : ie stop, take the inhaler, then p 5 minutes try the exact same activity (intensity of workload) that just caused the symptoms and see if they are substantially diminished or not after saba ?3) if there is an activity that reproducibly causes the symptoms, try the saba 15 min before the activity on alternate days  ? ?If in fact the saba really does help, then fine to continue to use it prn but advised may need to look closer at the maintenance regimen being used to achieve better control of airways disease with exertion.  ? ?  ?

## 2021-04-15 NOTE — Assessment & Plan Note (Signed)
Counseled re importance of smoking cessation but did not meet time criteria for separate billing            Each maintenance medication was reviewed in detail including emphasizing most importantly the difference between maintenance and prns and under what circumstances the prns are to be triggered using an action plan format where appropriate.  Total time for H and P, chart review, counseling, reviewing hfa/neb device(s) and generating customized AVS unique to this office visit / same day charting > 30 min       

## 2021-04-15 NOTE — Progress Notes (Signed)
Subjective:  ?  ? Patient ID: Michael [name], male   DOB: 07/24/1960      MRN: [id number] ? ?  ?Brief patient profile:  ?70 yowm MM/active smoker referred to pulmonary clinic 12/08/2015 by Dr  Graylon Good with GOLD III copd documented 08/19/13  ? ? ? ?History of Present Illness  ?10/11/2017  f/u ov/Leisl Spurrier re: COPD III/ still smoking maint on symb/ spiriva dpi  ?Chief Complaint  ?Patient presents with  ? Follow-up  ?  Breathing "depends on the heat outside"- he is unsure of meds and states "needs refills on everything".    ?Dyspnea:  MMRC3 = can't walk 100 yards even at a slow pace at a flat grade s stopping due to sob   ?Cough: provoked  with active smoking and ? From dpi spiriva also/ min mucoid  ?Sleeping: bed flat with a couple of pillows  ?SABA use: way too much when weather hot  ?02: none  ?rec ?Change spiriva to Respimat form  and use 2 pffs first thing in am only to follow the symbicort x 2 puffs only in the am, then take the other 2 pffs of symbicort 12 hours later  ?Work on inhaler technique:  ?The key is to stop smoking completely before smoking completely stops you!  ?Please schedule a follow up visit in 3 months but call sooner if needed  with all medications  ? ?  ? ? ?04/15/2021  f/u ov/Donnis Pecha re: GOLD 3 copd  maint on Symbicort/ spiriva   ?Chief Complaint  ?Patient presents with  ? Follow-up  ?  Follow up.   ? Dyspnea:  still able to get around a grocery store ok until broke L foot when "Harley fell on it" ?Cough: esp in am, congested clear mucus clears in a few mnitues  ?Sleeping: needs saba occ  ?SABA use: varies depending on activity / does not rechallenge  ?02: none  ?Covid status:   vax x 4  ? ? ?No obvious day to day or daytime variability or assoc purulent sputum or mucus plugs or hemoptysis or cp or chest tightness, subjective wheeze or overt sinus or hb symptoms.  ? ?Sleeping  without nocturnal  or early am exacerbation  of respiratory  c/o's or need for noct saba. Also denies any obvious fluctuation of  symptoms with weather or environmental changes or other aggravating or alleviating factors except as outlined above  ? ?No unusual exposure hx or h/o childhood pna/ asthma or knowledge of premature birth. ? ?Current Allergies, Complete Past Medical History, Past Surgical History, Family History, and Social History were reviewed in [employer] record. ? ?ROS  The following are not active complaints unless bolded ?Hoarseness, sore throat, dysphagia, dental problems, itching, sneezing,  nasal congestion or discharge of excess mucus or purulent secretions, ear ache,   fever, chills, sweats, unintended wt loss or wt gain, classically pleuritic or exertional cp,  orthopnea pnd or arm/hand swelling  or leg swelling, presyncope, palpitations, abdominal pain, anorexia, nausea, vomiting, diarrhea  or change in bowel habits or change in bladder habits, change in stools or change in urine, dysuria, hematuria,  rash, arthralgias, visual complaints, headache, numbness, weakness or ataxia or problems with walking/ L foot in boot  or coordination,  change in mood or  memory. ?      ? ?Current Meds  ?Medication Sig  ? albuterol (ACCUNEB) 1.25 MG/3ML nebulizer solution USE 1 VIAL IN NEBULIZER EVERY 6 HOURS AS NEEDED FOR WHEEZING  ?  FLUoxetine (PROZAC) 40 MG capsule Take 1 capsule (40 mg total) by mouth daily.  ? fluticasone (FLONASE) 50 MCG/ACT nasal spray Place 2 sprays into both nostrils as needed.  ? ibuprofen (ADVIL) 800 MG tablet Take 800 mg by mouth every 8 (eight) hours as needed.  ? losartan-hydrochlorothiazide (HYZAAR) 50-12.5 MG tablet Take 1 tablet by mouth daily. KEEP UPCOMING APPOINTMENT FOR FUTURE REFILLS.  ? Multiple Vitamin (MULTIVITAMIN WITH MINERALS) TABS tablet Take 1 tablet by mouth daily.  ? omeprazole (PRILOSEC) 20 MG capsule Take 1 capsule by mouth twice daily  ? SYMBICORT 160-4.5 MCG/ACT inhaler Inhale 2 puffs by mouth twice daily  ? Tiotropium Bromide Monohydrate (SPIRIVA RESPIMAT) 2.5  MCG/ACT AERS Inhale 2 puffs into the lungs daily.  ? VENTOLIN HFA 108 (90 Base) MCG/ACT inhaler INHALE UP TO  2 PUFFS BY MOUTH EVERY 4 HOURS AS NEEDED  ? Vitamin D, Ergocalciferol, (DRISDOL) 1.25 MG (50000 UNIT) CAPS capsule Take 1 capsule (50,000 Units total) by mouth every 7 (seven) days.  ?    ? ?  ? ?  ? ? ? ?  ?     ?   ?Objective:  ? Physical Exam ? ?  ?04/15/2021         136  ?10/06/2020         135  ?10/04/2019         126  ?02/19/2019         137  ?01/12/2018       137  ?10/11/2017       131  ?07/11/2017         125  ?01/11/2017         139  ?07/11/2016         144  ? 04/11/2016        147          ?12/08/15 157 lb 3.2 oz (71.3 kg)  ?11/04/15 153 lb 12.8 oz (69.8 kg)  ?08/25/15 160 lb 12.8 oz (72.9 kg)  ?  ?Vital signs reviewed  04/15/2021  - Note at rest 02 sats  97% on RA  ? ?General appearance:    disheveled wm > stated age in appearance  ? ?HEENT : nl exam  ? ? ?NECK :  without JVD/Nodes/TM/ nl carotid upstrokes bilaterally ? ? ?LUNGS: no acc muscle use,  Mod barrel  contour chest wall with bilateral  Distant bs s audible wheeze and  without cough on insp or exp maneuvers and mod  Hyperresonant  to  percussion bilaterally   ? ? ?CV:  RRR  no s3 or murmur or increase in P2, and no edema  ? ?ABD:  soft and nontender with pos mid insp Hoover's  in the supine position. No bruits or organomegaly appreciated, bowel sounds nl ? ?MS:     ext warm without deformities, calf tenderness, cyanosis or clubbing ?No obvious joint restrictions  ? ?SKIN: warm and dry without lesions   ? ?NEURO:  alert, approp, nl sensorium with  no motor or cerebellar deficits apparent.  ?    ? ? ?CXR PA and Lateral:   04/15/2021 :    ?I personally reviewed images and agree with radiology impression as follows:    ?Advanced emphysema, with increased interstitial opacities, either ?representing worsening chronic scarring versus superimposed atypical ?infection. ?My review: no acute  changes  ? ? ?    ?   ?Assessment:  ?   ?  ?   ? ?

## 2021-04-27 ENCOUNTER — Other Ambulatory Visit (HOSPITAL_COMMUNITY): Payer: Self-pay

## 2021-04-27 DIAGNOSIS — S92515A Nondisplaced fracture of proximal phalanx of left lesser toe(s), initial encounter for closed fracture: Secondary | ICD-10-CM | POA: Diagnosis not present

## 2021-04-27 DIAGNOSIS — M79672 Pain in left foot: Secondary | ICD-10-CM | POA: Diagnosis not present

## 2021-05-04 ENCOUNTER — Ambulatory Visit (INDEPENDENT_AMBULATORY_CARE_PROVIDER_SITE_OTHER): Payer: Medicare HMO

## 2021-05-04 DIAGNOSIS — I495 Sick sinus syndrome: Secondary | ICD-10-CM

## 2021-05-04 LAB — CUP PACEART REMOTE DEVICE CHECK
Battery Impedance: 960 Ohm
Battery Remaining Longevity: 75 mo
Battery Voltage: 2.77 V
Brady Statistic AP VP Percent: 0 %
Brady Statistic AP VS Percent: 47 %
Brady Statistic AS VP Percent: 1 %
Brady Statistic AS VS Percent: 52 %
Date Time Interrogation Session: 20230502120744
Implantable Lead Implant Date: 20050421
Implantable Lead Implant Date: 20050421
Implantable Lead Location: 753859
Implantable Lead Location: 753860
Implantable Lead Model: 5076
Implantable Lead Model: 5076
Implantable Pulse Generator Implant Date: 20140904
Lead Channel Impedance Value: 472 Ohm
Lead Channel Impedance Value: 666 Ohm
Lead Channel Pacing Threshold Amplitude: 0.75 V
Lead Channel Pacing Threshold Amplitude: 0.875 V
Lead Channel Pacing Threshold Pulse Width: 0.4 ms
Lead Channel Pacing Threshold Pulse Width: 0.4 ms
Lead Channel Setting Pacing Amplitude: 1.5 V
Lead Channel Setting Pacing Amplitude: 2 V
Lead Channel Setting Pacing Pulse Width: 0.4 ms
Lead Channel Setting Sensing Sensitivity: 5.6 mV

## 2021-05-17 ENCOUNTER — Ambulatory Visit (INDEPENDENT_AMBULATORY_CARE_PROVIDER_SITE_OTHER): Payer: Medicare HMO | Admitting: Cardiovascular Disease

## 2021-05-17 ENCOUNTER — Encounter: Payer: Self-pay | Admitting: Cardiovascular Disease

## 2021-05-17 VITALS — BP 102/80 | HR 70 | Ht 69.0 in | Wt 134.6 lb

## 2021-05-17 DIAGNOSIS — I480 Paroxysmal atrial fibrillation: Secondary | ICD-10-CM

## 2021-05-17 DIAGNOSIS — I495 Sick sinus syndrome: Secondary | ICD-10-CM

## 2021-05-17 DIAGNOSIS — J449 Chronic obstructive pulmonary disease, unspecified: Secondary | ICD-10-CM | POA: Diagnosis not present

## 2021-05-17 DIAGNOSIS — I471 Supraventricular tachycardia: Secondary | ICD-10-CM

## 2021-05-17 MED ORDER — LOSARTAN POTASSIUM-HCTZ 50-12.5 MG PO TABS: 1.0000 | ORAL_TABLET | Freq: Every day | ORAL | 3 refills | Status: DC

## 2021-05-17 NOTE — Patient Instructions (Signed)
Medication Instructions:  No changes *If you need a refill on your cardiac medications before your next appointment, please call your pharmacy*   Lab Work: None ordered If you have labs (blood work) drawn today and your tests are completely normal, you will receive your results only by: MyChart Message (if you have MyChart) OR A paper copy in the mail If you have any lab test that is abnormal or we need to change your treatment, we will call you to review the results.   Testing/Procedures: None ordered   Follow-Up: At [facility], you and your health needs are our priority.  As part of our continuing mission to provide you with exceptional heart care, we have created designated Provider Care Teams.  These Care Teams include your primary Cardiologist (physician) and Advanced Practice Providers (APPs -  Physician Assistants and Nurse Practitioners) who all work together to provide you with the care you need, when you need it.  We recommend signing up for the patient portal called "MyChart".  Sign up information is provided on this After Visit Summary.  MyChart is used to connect with patients for Virtual Visits (Telemedicine).  Patients are able to view lab/test results, encounter notes, upcoming appointments, etc.  Non-urgent messages can be sent to your provider as well.   To learn more about what you can do with MyChart, go to [email or web address].    Your next appointment:   12 month(s)  The format for your next appointment:   In Person  Provider:   [name]  Important Information About Sugar       

## 2021-05-17 NOTE — Progress Notes (Signed)
?Cardiology Office Note:   ? ?Date:  05/17/2021  ? ?ID:  Michael [name], DOB 07/29/1960, MRN [id number] ? ?PCP:  Ardith DarkParker, Caleb M, MD  ?[facility] Cardiologist:  None  ?[facility] Electrophysiologist:  None  ? ?Referring MD: Ardith DarkParker, Caleb M, MD  ? ?CC: Pacemaker follow up ? ?History of Present Illness:   ? ?Michael [name] is a 61 y.o. male with a hx of sinus node dysfunction s/p pacemaker, atrial fibrillation, tobacco use disorder who presents for follow up of pacemaker. This is Mr. [name]'s first in-person pacemaker check since 2021.  ? ?Patient has occasional palpitations that occur randomly.  They are not associated with dizziness or syncope.  He denies chest pain at rest or with activity.  He has chronic shortness of breath, unchanged since his previous visit.  He does not report lower extremity edema, orthopnea, PND, focal neurological deficits or claudication. ? ?He continues to smoke at least a pack of cigarettes daily and drinks large amounts of [facility]. ? ?Pacemaker was interrogated today. Patient is not pacemaker dependent.  1% ventricular pacing.  Lead parameters are all normal. He is 40% atrial paced and 0.5% ventricular paced. All lead parameters were checked and within normal limits.  ? ?As before, his device has recorded occasional episodes of brief atrial fibrillation.  The longest 1 lasted for 40 minutes and occurred in April 2022.  4 other episodes of atrial fibrillation reported that are all less than 10 minutes in duration.  He has had a burden of atrial fibrillation of less than 0.1% for several years now.  Much more frequently he has brief bursts of paroxysmal atrial tachycardia with 1: 1 AV conduction with rates of 180-200 bpm.  These usually last for no more than 5 or 6 seconds. ? ?Nuclear stress test in 2018 was a low risk study, describing diffuse hypokinesis (EF 49%) and possibly a very small amount of apical ischemia. ? ?Past Medical History:  ?Diagnosis Date  ? Allergy   ? Anginal pain  (HCC)   ? Anxiety   ? Arthritis   ? Bradycardia   ? CHF (congestive heart failure) (HCC)   ? Chronic chest pain   ? COPD (chronic obstructive pulmonary disease) (HCC)   ? Depression   ? Emphysema of lung (HCC)   ? GERD (gastroesophageal reflux disease)   ? History of nuclear stress test 06/14/2011  ? lexiscan; mild perfusion defect in basal inferosetpal & apical inferior region; negative for ischemia   ? Hypothyroidism   ? Pacemaker   ? Pneumonia   ? Presence of permanent cardiac pacemaker April 2005  ? For bradycardia  ? Seizure disorder (HCC)   ? Seizures (HCC)   ? as a baby  ? Shortness of breath   ? Tobacco abuse   ? ? ?Past Surgical History:  ?Procedure Laterality Date  ? CARDIAC CATHETERIZATION  2002, 2003  ? normal coronaries  ? EYE SURGERY  [age] y.o.  ? GALLBLADDER SURGERY  2003  ? HAND SURGERY  1998  ? x3  ? OPEN REDUCTION INTERNAL FIXATION (ORIF) DISTAL RADIAL FRACTURE Right 09/07/2016  ? Procedure: RIGHT WRIST OPEN REDUCTION INTERNAL FIXATION (ORIF) REPAIR AS INDICATED;  Surgeon: Bradly Bienenstockrtmann, Fred, MD;  Location: [facility] OR;  Service: Orthopedics;  Laterality: Right;  ? PACEMAKER INSERTION  04/23/2003  [facility] Medtronic Kappa[facility]  ? PERMANENT PACEMAKER GENERATOR CHANGE N/A 09/06/2012  ? Procedure: PERMANENT PACEMAKER GENERATOR CHANGE;  Surgeon: Thurmon FairMihai Covey Baller, MD;  Location: [facility] CATH LAB;  Service: Cardiovascular;  Laterality: N/A;  ? TONSILLECTOMY    ? TRANSTHORACIC ECHOCARDIOGRAM  05/14/2012  ? EF 50-55%, normal systolic function; mild MR (ordered for mitral valve disease 424.0)  ? ? ?Current Medications: ?Current Meds  ?Medication Sig  ? albuterol (ACCUNEB) 1.25 MG/3ML nebulizer solution 2.5 mg every 4 hours if needed  ? FLUoxetine (PROZAC) 40 MG capsule Take 1 capsule (40 mg total) by mouth daily.  ? ibuprofen (ADVIL) 800 MG tablet Take 800 mg by mouth every 8 (eight) hours as needed.  ? Multiple Vitamin (MULTIVITAMIN WITH MINERALS) TABS tablet Take 1 tablet by mouth  daily.  ? omeprazole (PRILOSEC) 20 MG capsule Take 1 capsule by mouth twice daily  ? SYMBICORT 160-4.5 MCG/ACT inhaler Take 2 puffs first thing in am and then another 2 puffs about 12 hours later.  ? Tiotropium Bromide Monohydrate (SPIRIVA RESPIMAT) 2.5 MCG/ACT AERS Inhale 2 puffs into the lungs daily.  ? VENTOLIN HFA 108 (90 Base) MCG/ACT inhaler INHALE UP TO  2 PUFFS BY MOUTH EVERY 4 HOURS AS NEEDED  ? [DISCONTINUED] losartan-hydrochlorothiazide (HYZAAR) 50-12.5 MG tablet Take 1 tablet by mouth daily. KEEP UPCOMING APPOINTMENT FOR FUTURE REFILLS.  ?  ? ?Allergies:   Shellfish allergy  ? ?Social History  ? ?Socioeconomic History  ? Marital status: Divorced  ?  Spouse name: Not on file  ? Number of children: 2  ? Years of education: 66  ? Highest education level: Not on file  ?Occupational History  ? Occupation: Disabled  ?Tobacco Use  ? Smoking status: Every Day  ?  Packs/day: 1.00  ?  Years: 40.00  ?  Pack years: 40.00  ?  Types: Cigarettes  ? Smokeless tobacco: Never  ?Vaping Use  ? Vaping Use: Never used  ?Substance and Sexual Activity  ? Alcohol use: No  ? Drug use: Never  ? Sexual activity: Not Currently  ?Other Topics Concern  ? Not on file  ?Social History Narrative  ? Not on file  ? ?Social Determinants of Health  ? ?Financial Resource Strain: Low Risk   ? Difficulty of Paying Living Expenses: Not hard at all  ?Food Insecurity: No Food Insecurity  ? Worried About Programme researcher, broadcasting/film/video in the Last Year: Never true  ? Ran Out of Food in the Last Year: Never true  ?Transportation Needs: No Transportation Needs  ? Lack of Transportation (Medical): No  ? Lack of Transportation (Non-Medical): No  ?Physical Activity: Inactive  ? Days of Exercise per Week: 0 days  ? Minutes of Exercise per Session: 0 min  ?Stress: Stress Concern Present  ? Feeling of Stress : Rather much  ?Social Connections: Moderately Isolated  ? Frequency of Communication with Friends and Family: More than three times a week  ? Frequency of Social  Gatherings with Friends and Family: Twice a week  ? Attends Religious Services: Never  ? Active Member of Clubs or Organizations: Yes  ? Attends Banker Meetings: Never  ? Marital Status: Divorced  ?  ? ?Family History: ?The patient's *family history includes CAD in his father; COPD in his father; Heart Problems in his mother and another family member; Heart attack in his maternal grandmother, paternal grandfather, and paternal grandmother; Heart disease in his father and mother; Hypertension in his maternal grandmother, paternal grandfather, and paternal grandmother; Other in his sister. ? ?ROS:   ?Please see the history of present illness.    ?All other systems reviewed and are  negative. ? ?EKGs/Labs/Other Studies Reviewed:   ? ?EKG: Not ordered today.  ECG from 01/27/2020 shows normal sinus rhythm, normal tracing. ? ?Recent Labs: ?Last lab work in 2019 showed creatinine of .78, potassium of 3.8, hemoglobin of 12.2.  ? ?Recent Lipid Panel ?   ?Component Value Date/Time  ? CHOL 153 09/04/2012 0839  ? TRIG 96 09/04/2012 0839  ? HDL 45 09/04/2012 0839  ? CHOLHDL 3.4 09/04/2012 0839  ? VLDL 19 09/04/2012 0839  ? LDLCALC 89 09/04/2012 0839  ? ?Physical Exam:   ? ?VS:  BP 102/80   Pulse 70   Ht 5\' 9"  (1.753 m)   Wt 134 lb 9.6 oz (61.1 kg)   BMI 19.88 kg/m?    ? ?Wt Readings from Last 3 Encounters:  ?05/17/21 134 lb 9.6 oz (61.1 kg)  ?04/15/21 136 lb 9.6 oz (62 kg)  ?10/06/20 135 lb (61.2 kg)  ?  ? ? ?General: Alert, oriented x3, no distress, lean, healthy left subclavian pacemaker site ?Head: no evidence of trauma, PERRL, EOMI, no exophtalmos or lid lag, no myxedema, no xanthelasma; normal ears, nose and oropharynx ?Neck: normal jugular venous pulsations and no hepatojugular reflux; brisk carotid pulses without delay and no carotid bruits ?Chest: clear to auscultation, no signs of consolidation by percussion or palpation, normal fremitus, symmetrical and full respiratory excursions ?Cardiovascular:  normal position and quality of the apical impulse, regular rhythm, normal first and second heart sounds, no murmurs, rubs or gallops ?Abdomen: no tenderness or distention, no masses by palpation, no abnormal pu

## 2021-05-18 NOTE — Progress Notes (Signed)
Remote pacemaker transmission.   

## 2021-06-08 DIAGNOSIS — S92515A Nondisplaced fracture of proximal phalanx of left lesser toe(s), initial encounter for closed fracture: Secondary | ICD-10-CM | POA: Diagnosis not present

## 2021-06-08 DIAGNOSIS — M79672 Pain in left foot: Secondary | ICD-10-CM | POA: Diagnosis not present

## 2021-06-21 ENCOUNTER — Telehealth: Payer: Self-pay | Admitting: Cardiovascular Disease

## 2021-06-21 NOTE — Telephone Encounter (Signed)
*  STAT* If patient is at the pharmacy, call can be transferred to refill team.   1. Which medications need to be refilled? (please list name of each medication and dose if known) Vitamin D, Ergocalciferol, (DRISDOL) 1.25 MG (50000 UNIT) CAPS capsule  2. Which pharmacy/location (including street and city if local pharmacy) is medication to be sent to? [facility] [address] [place] - [address]  3. Do they need a 30 day or 90 day supply? 90

## 2021-06-22 MED ORDER — VITAMIN D (ERGOCALCIFEROL) 1.25 MG (50000 UNIT) PO CAPS: 50000.0000 [IU] | ORAL_CAPSULE | ORAL | 3 refills | Status: DC

## 2021-06-25 ENCOUNTER — Telehealth: Payer: Self-pay | Admitting: Family Medicine

## 2021-06-25 DIAGNOSIS — K219 Gastro-esophageal reflux disease without esophagitis: Secondary | ICD-10-CM

## 2021-07-01 NOTE — Telephone Encounter (Signed)
...   Encourage patient to contact the pharmacy for refills or they can request refills through [facility]  LAST APPOINTMENT DATE:  04/13/2020  NEXT APPOINTMENT DATE:  08/02/21  MEDICATION:Prozac and omeprazole   Is the patient out of medication?  Patient has scheduled appt 7/31 for CPE - requesting meds to be filled through this date.  PHARMACY:  [facility] [address]   Let patient know to contact pharmacy at the end of the day to make sure medication is ready.  Please notify patient to allow 48-72 hours to process  CLINICAL FILLS OUT ALL BELOW:   LAST REFILL:  QTY:  REFILL DATE:    OTHER COMMENTS:    Okay for refill?  Please advise

## 2021-07-02 ENCOUNTER — Other Ambulatory Visit: Payer: Self-pay | Admitting: *Deleted

## 2021-07-02 DIAGNOSIS — K219 Gastro-esophageal reflux disease without esophagitis: Secondary | ICD-10-CM

## 2021-07-02 MED ORDER — OMEPRAZOLE 20 MG PO CPDR: 20.0000 mg | DELAYED_RELEASE_CAPSULE | Freq: Two times a day (BID) | ORAL | 0 refills | Status: DC

## 2021-07-02 MED ORDER — FLUOXETINE HCL 40 MG PO CAPS: 40.0000 mg | ORAL_CAPSULE | Freq: Every day | ORAL | 3 refills | Status: DC

## 2021-07-02 NOTE — Telephone Encounter (Signed)
Rx send to pharmacy  

## 2021-08-02 ENCOUNTER — Encounter: Payer: Self-pay | Admitting: Family Medicine

## 2021-08-02 ENCOUNTER — Ambulatory Visit (INDEPENDENT_AMBULATORY_CARE_PROVIDER_SITE_OTHER): Payer: Medicare HMO | Admitting: Family Medicine

## 2021-08-02 VITALS — BP 109/73 | HR 70 | Temp 97.8°F | Ht 69.0 in | Wt 138.0 lb

## 2021-08-02 DIAGNOSIS — K219 Gastro-esophageal reflux disease without esophagitis: Secondary | ICD-10-CM | POA: Diagnosis not present

## 2021-08-02 DIAGNOSIS — I159 Secondary hypertension, unspecified: Secondary | ICD-10-CM

## 2021-08-02 DIAGNOSIS — E039 Hypothyroidism, unspecified: Secondary | ICD-10-CM | POA: Diagnosis not present

## 2021-08-02 DIAGNOSIS — E559 Vitamin D deficiency, unspecified: Secondary | ICD-10-CM

## 2021-08-02 DIAGNOSIS — Z0001 Encounter for general adult medical examination with abnormal findings: Secondary | ICD-10-CM | POA: Diagnosis not present

## 2021-08-02 DIAGNOSIS — G894 Chronic pain syndrome: Secondary | ICD-10-CM | POA: Diagnosis not present

## 2021-08-02 DIAGNOSIS — F32A Depression, unspecified: Secondary | ICD-10-CM | POA: Diagnosis not present

## 2021-08-02 DIAGNOSIS — Z79899 Other long term (current) drug therapy: Secondary | ICD-10-CM | POA: Diagnosis not present

## 2021-08-02 DIAGNOSIS — F419 Anxiety disorder, unspecified: Secondary | ICD-10-CM

## 2021-08-02 DIAGNOSIS — R739 Hyperglycemia, unspecified: Secondary | ICD-10-CM

## 2021-08-02 DIAGNOSIS — I480 Paroxysmal atrial fibrillation: Secondary | ICD-10-CM

## 2021-08-02 DIAGNOSIS — E785 Hyperlipidemia, unspecified: Secondary | ICD-10-CM | POA: Diagnosis not present

## 2021-08-02 MED ORDER — IBUPROFEN 800 MG PO TABS: 800.0000 mg | ORAL_TABLET | Freq: Three times a day (TID) | ORAL | 11 refills | Status: DC | PRN

## 2021-08-02 MED ORDER — OMEPRAZOLE 20 MG PO CPDR: 20.0000 mg | DELAYED_RELEASE_CAPSULE | Freq: Two times a day (BID) | ORAL | 3 refills | Status: DC

## 2021-08-02 MED ORDER — FLUOXETINE HCL 40 MG PO CAPS: 80.0000 mg | ORAL_CAPSULE | Freq: Every day | ORAL | 3 refills | Status: DC

## 2021-08-02 NOTE — Progress Notes (Signed)
Chief Complaint:  Michael [name] is a 61 y.o. male who presents today for his annual comprehensive physical exam.    Assessment/Plan:  Chronic Problems Addressed Today: Depression He is doing okay on Prozac though thinks that this could be stronger.  We will increase to 80 mg daily.  He will follow-up in a few weeks.  No SI or HI.  We discussed potential side effects of Prozac.  PHQ9 declined  Anxiety See depression A/P.  Be increasing his dose of Prozac as above to 80mg  daily.   Paroxysmal atrial fibrillation Regular rate and rhythm today.  Continue management per cardiology.  Chronic pain syndrome Pain is still persistent.  We will refill his ibuprofen today.  He follows with orthopedics for his pain control.  Recently broke his left foot which has been another source of pain for him.  Vitamin D deficiency Check vitamin D.   HTN (hypertension) At goal today on losartan-HCTZ 50-12.5 per cardiology.  Hypothyroidism Check TSH.   Preventative Healthcare: Check labs.  Encourage smoking cessation  Patient Counseling(The following topics were reviewed and/or handout was given):  -Nutrition: Stressed importance of moderation in sodium/caffeine intake, saturated fat and cholesterol, caloric balance, sufficient intake of fresh fruits, vegetables, and fiber.  -Stressed the importance of regular exercise.   -Substance Abuse: Discussed cessation/primary prevention of tobacco, alcohol, or other drug use; driving or other dangerous activities under the influence; availability of treatment for abuse.   -Injury prevention: Discussed safety belts, safety helmets, smoke detector, smoking near bedding or upholstery.   -Sexuality: Discussed sexually transmitted diseases, partner selection, use of condoms, avoidance of unintended pregnancy and contraceptive alternatives.   -Dental health: Discussed importance of regular tooth brushing, flossing, and dental visits.  -Health maintenance and  immunizations reviewed. Please refer to Health maintenance section.  Return to care in 1 year for next preventative visit.     Subjective:  HPI:  He has no acute complaints today. See A/p for status of chronic conditions.      09/26/2020   10:10 AM  Depression screen PHQ 2/9  Decreased Interest 0  Down, Depressed, Hopeless 0  PHQ - 2 Score 0  Altered sleeping 2  Tired, decreased energy 3  Change in appetite 3  Feeling bad or failure about yourself  3  Trouble concentrating 3  Moving slowly or fidgety/restless 0  Suicidal thoughts 1  PHQ-9 Score 15  Difficult doing work/chores Somewhat difficult   ROS: Per HPI, otherwise a complete review of systems was negative.   PMH:  The following were reviewed and entered/updated in epic: Past Medical History:  Diagnosis Date   Allergy    Anginal pain (HCC)    Anxiety    Arthritis    Bradycardia    CHF (congestive heart failure) (HCC)    Chronic chest pain    COPD (chronic obstructive pulmonary disease) (HCC)    Depression    Emphysema of lung (HCC)    GERD (gastroesophageal reflux disease)    History of nuclear stress test 06/14/2011   lexiscan; mild perfusion defect in basal inferosetpal & apical inferior region; negative for ischemia    Hypothyroidism    Pacemaker    Pneumonia    Presence of permanent cardiac pacemaker April 2005   For bradycardia   Seizure disorder (HCC)    Seizures (HCC)    as a baby   Shortness of breath    Tobacco abuse    Patient Active Problem List   Diagnosis Date  Noted   Osteoarthritis 04/13/2020   Chronic pain syndrome 04/13/2020   Vitamin D deficiency 11/11/2019   History of cardiac pacemaker in situ 11/11/2019   Asthma 11/11/2019   Seizure disorder (HCC) 03/17/2017   Hypothyroidism 03/17/2017   GERD (gastroesophageal reflux disease) 03/17/2017   Anxiety 03/17/2017   Depression 03/17/2017   HTN (hypertension) 03/17/2017   Cigarette smoker 04/13/2016   PTSD (post-traumatic stress  disorder) 01/27/2016   Chronic respiratory failure with hypercapnia (HCC) 12/09/2015   Paroxysmal atrial fibrillation (HCC) 10/22/2013   Sinus node dysfunction (HCC) 10/22/2013   COPD GOLD III  07/08/2013   Symptomatic bradycardia 09/06/2012   Pacemaker - Medtronic 2005, generator change September 2014 09/06/2012   Past Surgical History:  Procedure Laterality Date   CARDIAC CATHETERIZATION  2002, 2003   normal coronaries   EYE SURGERY  [age] y.o.   GALLBLADDER SURGERY  2003   HAND SURGERY  1998   x3   OPEN REDUCTION INTERNAL FIXATION (ORIF) DISTAL RADIAL FRACTURE Right 09/07/2016   Procedure: RIGHT WRIST OPEN REDUCTION INTERNAL FIXATION (ORIF) REPAIR AS INDICATED;  Surgeon: Bradly Bienenstock, MD;  Location: [facility] OR;  Service: Orthopedics;  Laterality: Right;   PACEMAKER INSERTION  04/23/2003   Medtronic Kappa; [facility] - symptomatic bradycardia   PERMANENT PACEMAKER GENERATOR CHANGE N/A 09/06/2012   Procedure: PERMANENT PACEMAKER GENERATOR CHANGE;  Surgeon: Thurmon Fair, MD;  Location: [facility] CATH LAB;  Service: Cardiovascular;  Laterality: N/A;   TONSILLECTOMY     TRANSTHORACIC ECHOCARDIOGRAM  05/14/2012   EF 50-55%, normal systolic function; mild MR (ordered for mitral valve disease 424.0)    Family History  Problem Relation Age of Onset   Heart Problems Mother        MVP   Heart disease Mother    CAD Father    COPD Father    Heart disease Father    Other Sister        crib dealth   Heart attack Maternal Grandmother    Hypertension Maternal Grandmother    Heart attack Paternal Grandmother    Hypertension Paternal Grandmother    Heart attack Paternal Grandfather    Hypertension Paternal Grandfather    Heart Problems Other        aunts x 2 died of heart problems     Medications- reviewed and updated Current Outpatient Medications  Medication Sig Dispense Refill   albuterol (ACCUNEB) 1.25 MG/3ML nebulizer solution 2.5 mg every 4 hours if needed 150 mL 0    fluticasone (FLONASE) 50 MCG/ACT nasal spray Place 2 sprays into both nostrils as needed.     losartan-hydrochlorothiazide (HYZAAR) 50-12.5 MG tablet Take 1 tablet by mouth daily. 90 tablet 3   Multiple Vitamin (MULTIVITAMIN WITH MINERALS) TABS tablet Take 1 tablet by mouth daily.     SYMBICORT 160-4.5 MCG/ACT inhaler Take 2 puffs first thing in am and then another 2 puffs about 12 hours later. 33 g 3   Tiotropium Bromide Monohydrate (SPIRIVA RESPIMAT) 2.5 MCG/ACT AERS Inhale 2 puffs into the lungs daily. 3 each 3   VENTOLIN HFA 108 (90 Base) MCG/ACT inhaler INHALE UP TO  2 PUFFS BY MOUTH EVERY 4 HOURS AS NEEDED 54 g 3   Vitamin D, Ergocalciferol, (DRISDOL) 1.25 MG (50000 UNIT) CAPS capsule Take 1 capsule (50,000 Units total) by mouth every 7 (seven) days. 10 capsule 3   FLUoxetine (PROZAC) 40 MG capsule Take 2 capsules (80 mg total) by mouth daily. 180 capsule 3   ibuprofen (ADVIL) 800 MG  tablet Take 1 tablet (800 mg total) by mouth every 8 (eight) hours as needed. 30 tablet 11   omeprazole (PRILOSEC) 20 MG capsule Take 1 capsule (20 mg total) by mouth 2 (two) times daily. 180 capsule 3   No current facility-administered medications for this visit.    Allergies-reviewed and updated Allergies  Allergen Reactions   Shellfish Allergy Anaphylaxis    Social History   Socioeconomic History   Marital status: Divorced    Spouse name: Not on file   Number of children: 2   Years of education: 12   Highest education level: Not on file  Occupational History   Occupation: Disabled  Tobacco Use   Smoking status: Every Day    Packs/day: 1.00    Years: 40.00    Total pack years: 40.00    Types: Cigarettes   Smokeless tobacco: Never  Vaping Use   Vaping Use: Never used  Substance and Sexual Activity   Alcohol use: No   Drug use: Never   Sexual activity: Not Currently  Other Topics Concern   Not on file  Social History Narrative   Not on file   Social Determinants of Health    Financial Resource Strain: Low Risk  (09/26/2020)   Overall Financial Resource Strain (CARDIA)    Difficulty of Paying Living Expenses: Not hard at all  Food Insecurity: No Food Insecurity (09/26/2020)   Hunger Vital Sign    Worried About Running Out of Food in the Last Year: Never true    Ran Out of Food in the Last Year: Never true  Transportation Needs: No Transportation Needs (09/26/2020)   PRAPARE - Administrator, Civil Service (Medical): No    Lack of Transportation (Non-Medical): No  Physical Activity: Inactive (09/26/2020)   Exercise Vital Sign    Days of Exercise per Week: 0 days    Minutes of Exercise per Session: 0 min  Stress: Stress Concern Present (09/26/2020)   [employer] of Occupational Health - Occupational Stress Questionnaire    Feeling of Stress : Rather much  Social Connections: Moderately Isolated (09/26/2020)   Social Connection and Isolation Panel [NHANES]    Frequency of Communication with Friends and Family: More than three times a week    Frequency of Social Gatherings with Friends and Family: Twice a week    Attends Religious Services: Never    Database administrator or Organizations: Yes    Attends Banker Meetings: Never    Marital Status: Divorced        Objective:  Physical Exam: BP 109/73   Pulse 70   Temp 97.8 F (36.6 C) (Temporal)   Ht 5\' 9"  (1.753 m)   Wt 138 lb (62.6 kg)   SpO2 99%   BMI 20.38 kg/m   Body mass index is 20.38 kg/m. Wt Readings from Last 3 Encounters:  08/02/21 138 lb (62.6 kg)  05/17/21 134 lb 9.6 oz (61.1 kg)  04/15/21 136 lb 9.6 oz (62 kg)   Gen: NAD, resting comfortably HEENT: TMs normal bilaterally. OP clear. No thyromegaly noted.  CV: RRR with no murmurs appreciated Pulm: NWOB, CTAB with no crackles, wheezes, or rhonchi GI: Normal bowel sounds present. Soft, Nontender, Nondistended. MSK: no edema, cyanosis, or clubbing noted Skin: warm, dry Neuro: CN2-12 grossly intact.  Strength 5/5 in upper and lower extremities. Reflexes symmetric and intact bilaterally.  Psych: Normal affect and thought content     Lakara Weiland M. 04/17/21, MD 08/02/2021 2:03 PM

## 2021-08-02 NOTE — Assessment & Plan Note (Addendum)
He is doing okay on Prozac though thinks that this could be stronger.  We will increase to 80 mg daily.  He will follow-up in a few weeks.  No SI or HI.  We discussed potential side effects of Prozac.  PHQ9 declined

## 2021-08-02 NOTE — Assessment & Plan Note (Signed)
See depression A/P.  Be increasing his dose of Prozac as above to 80mg  daily.

## 2021-08-02 NOTE — Assessment & Plan Note (Signed)
Regular rate and rhythm today.  Continue management per cardiology. 

## 2021-08-02 NOTE — Assessment & Plan Note (Signed)
At goal today on losartan-HCTZ 50-12.5 per cardiology.

## 2021-08-02 NOTE — Patient Instructions (Signed)
It was very nice to see you today!  We will increase your dose of Prozac to 80 mg daily.  Please let me know how this is working for you in a few weeks.  I will refill your other medications today.  I will see back in year for your next physical.  Please come back to see [place] sooner if needed.  Take care, Dr Jimmey Ralph  PLEASE NOTE:  If you had any lab tests please let us know if you have not heard back within a few days. You may see your results on mychart before we have a chance to review them but we will give you a call once they are reviewed by [place]. If we ordered any referrals today, please let us know if you have not heard from their office within the next week.   Please try these tips to maintain a healthy lifestyle:  Eat at least 3 REAL meals and 1-2 snacks per day.  Aim for no more than 5 hours between eating.  If you eat breakfast, please do so within one hour of getting up.   Each meal should contain half fruits/vegetables, one quarter protein, and one quarter carbs (no bigger than a computer mouse)  Cut down on sweet beverages. This includes juice, soda, and sweet tea.   Drink at least 1 glass of water with each meal and aim for at least 8 glasses per day  Exercise at least 150 minutes every week.      Preventive Care 64-42 Years Old, Male Preventive care refers to lifestyle choices and visits with your health care provider that can promote health and wellness. Preventive care visits are also called wellness exams. What can I expect for my preventive care visit? Counseling During your preventive care visit, your health care provider may ask about your: Medical history, including: Past medical problems. Family medical history. Current health, including: Emotional well-being. Home life and relationship well-being. Sexual activity. Lifestyle, including: Alcohol, nicotine or tobacco, and drug use. Access to firearms. Diet, exercise, and sleep habits. Safety issues such as  seatbelt and bike helmet use. Sunscreen use. Work and work Astronomer. Physical exam Your health care provider will check your: Height and weight. These may be used to calculate your BMI (body mass index). BMI is a measurement that tells if you are at a healthy weight. Waist circumference. This measures the distance around your waistline. This measurement also tells if you are at a healthy weight and may help predict your risk of certain diseases, such as type 2 diabetes and high blood pressure. Heart rate and blood pressure. Body temperature. Skin for abnormal spots. What immunizations do I need?  Vaccines are usually given at various ages, according to a schedule. Your health care provider will recommend vaccines for you based on your age, medical history, and lifestyle or other factors, such as travel or where you work. What tests do I need? Screening Your health care provider may recommend screening tests for certain conditions. This may include: Lipid and cholesterol levels. Diabetes screening. This is done by checking your blood sugar (glucose) after you have not eaten for a while (fasting). Hepatitis B test. Hepatitis C test. HIV (human immunodeficiency virus) test. STI (sexually transmitted infection) testing, if you are at risk. Lung cancer screening. Prostate cancer screening. Colorectal cancer screening. Talk with your health care provider about your test results, treatment options, and if necessary, the need for more tests. Follow these instructions at home: Eating and drinking  Eat a diet that includes fresh fruits and vegetables, whole grains, lean protein, and low-fat dairy products. Take vitamin and mineral supplements as recommended by your health care provider. Do not drink alcohol if your health care provider tells you not to drink. If you drink alcohol: Limit how much you have to 0-2 drinks a day. Know how much alcohol is in your drink. In the [place] one drink  equals one 12 oz bottle of beer (355 mL), one 5 oz glass of wine (148 mL), or one 1 oz glass of hard liquor (44 mL). Lifestyle Brush your teeth every morning and night with fluoride toothpaste. Floss one time each day. Exercise for at least 30 minutes 5 or more days each week. Do not use any products that contain nicotine or tobacco. These products include cigarettes, chewing tobacco, and vaping devices, such as e-cigarettes. If you need help quitting, ask your health care provider. Do not use drugs. If you are sexually active, practice safe sex. Use a condom or other form of protection to prevent STIs. Take aspirin only as told by your health care provider. Make sure that you understand how much to take and what form to take. Work with your health care provider to find out whether it is safe and beneficial for you to take aspirin daily. Find healthy ways to manage stress, such as: Meditation, yoga, or listening to music. Journaling. Talking to a trusted person. Spending time with friends and family. Minimize exposure to UV radiation to reduce your risk of skin cancer. Safety Always wear your seat belt while driving or riding in a vehicle. Do not drive: If you have been drinking alcohol. Do not ride with someone who has been drinking. When you are tired or distracted. While texting. If you have been using any mind-altering substances or drugs. Wear a helmet and other protective equipment during sports activities. If you have firearms in your house, make sure you follow all gun safety procedures. What's next? Go to your health care provider once a year for an annual wellness visit. Ask your health care provider how often you should have your eyes and teeth checked. Stay up to date on all vaccines. This information is not intended to replace advice given to you by your health care provider. Make sure you discuss any questions you have with your health care provider. Document Revised:  06/17/2020 Document Reviewed: 06/17/2020 Elsevier Patient Education  2023 [employer].

## 2021-08-02 NOTE — Assessment & Plan Note (Signed)
Check TSH 

## 2021-08-02 NOTE — Assessment & Plan Note (Signed)
Check vitamin D. 

## 2021-08-02 NOTE — Assessment & Plan Note (Signed)
Pain is still persistent.  We will refill his ibuprofen today.  He follows with orthopedics for his pain control.  Recently broke his left foot which has been another source of pain for him.

## 2021-08-03 ENCOUNTER — Ambulatory Visit (INDEPENDENT_AMBULATORY_CARE_PROVIDER_SITE_OTHER): Payer: Medicare HMO

## 2021-08-03 DIAGNOSIS — I495 Sick sinus syndrome: Secondary | ICD-10-CM

## 2021-08-03 LAB — COMPREHENSIVE METABOLIC PANEL
ALT: 12 U/L (ref 0–53)
AST: 19 U/L (ref 0–37)
Albumin: 4.2 g/dL (ref 3.5–5.2)
Alkaline Phosphatase: 98 U/L (ref 39–117)
BUN: 19 mg/dL (ref 6–23)
CO2: 32 mEq/L (ref 19–32)
Calcium: 9.2 mg/dL (ref 8.4–10.5)
Chloride: 95 mEq/L — ABNORMAL LOW (ref 96–112)
Creatinine, Ser: 1.14 mg/dL (ref 0.40–1.50)
GFR: 69.87 mL/min (ref >60.00)
Glucose, Bld: 78 mg/dL (ref 70–99)
Potassium: 3.8 mEq/L (ref 3.5–5.1)
Sodium: 134 mEq/L — ABNORMAL LOW (ref 135–145)
Total Bilirubin: 0.6 mg/dL (ref 0.2–1.2)
Total Protein: 6.8 g/dL (ref 6.0–8.3)

## 2021-08-03 LAB — LIPID PANEL
Cholesterol: 151 mg/dL (ref 0–200)
HDL: 45.5 mg/dL (ref >39.00)
LDL Cholesterol: 90 mg/dL (ref 0–99)
NonHDL: 105.19
Total CHOL/HDL Ratio: 3
Triglycerides: 78 mg/dL (ref 0.0–149.0)
VLDL: 15.6 mg/dL (ref 0.0–40.0)

## 2021-08-03 LAB — CBC
HCT: 38.7 % — ABNORMAL LOW (ref 39.0–52.0)
Hemoglobin: 13.3 g/dL (ref 13.0–17.0)
MCHC: 34.3 g/dL (ref 30.0–36.0)
MCV: 97.7 fl (ref 78.0–100.0)
Platelets: 272 10*3/uL (ref 150.0–400.0)
RBC: 3.96 Mil/uL — ABNORMAL LOW (ref 4.22–5.81)
RDW: 12.7 % (ref 11.5–15.5)
WBC: 5.6 10*3/uL (ref 4.0–10.5)

## 2021-08-03 LAB — TSH: TSH: 0.24 u[IU]/mL — ABNORMAL LOW (ref 0.35–5.50)

## 2021-08-03 LAB — VITAMIN D 25 HYDROXY (VIT D DEFICIENCY, FRACTURES): VITD: 30.38 ng/mL (ref 30.00–100.00)

## 2021-08-03 LAB — VITAMIN B12: Vitamin B-12: 256 pg/mL (ref 211–911)

## 2021-08-03 LAB — HEMOGLOBIN A1C: Hgb A1c MFr Bld: 5.7 % (ref 4.6–6.5)

## 2021-08-03 NOTE — Progress Notes (Signed)
Please inform patient of the following:  His thyroid level is off a little bit.  Can we add on free T4 and free T3?   His blood sugar is borderline but everything else is stable.  Do not need to make any changes to treatment plan at this time.  We can recheck everything else in a year.

## 2021-08-03 NOTE — Addendum Note (Signed)
Addended by: Candie Chroman on: 08/03/2021 01:46 PM   Modules accepted: Orders

## 2021-08-10 LAB — CUP PACEART REMOTE DEVICE CHECK
Battery Impedance: 1067 Ohm
Battery Remaining Longevity: 70 mo
Battery Voltage: 2.77 V
Brady Statistic AP VP Percent: 0 %
Brady Statistic AP VS Percent: 48 %
Brady Statistic AS VP Percent: 0 %
Brady Statistic AS VS Percent: 51 %
Date Time Interrogation Session: 20230804154815
Implantable Lead Implant Date: 20050421
Implantable Lead Implant Date: 20050421
Implantable Lead Location: 753859
Implantable Lead Location: 753860
Implantable Lead Model: 5076
Implantable Lead Model: 5076
Implantable Pulse Generator Implant Date: 20140904
Lead Channel Impedance Value: 539 Ohm
Lead Channel Impedance Value: 646 Ohm
Lead Channel Pacing Threshold Amplitude: 0.625 V
Lead Channel Pacing Threshold Amplitude: 0.875 V
Lead Channel Pacing Threshold Pulse Width: 0.4 ms
Lead Channel Pacing Threshold Pulse Width: 0.4 ms
Lead Channel Setting Pacing Amplitude: 1.75 V
Lead Channel Setting Pacing Amplitude: 2 V
Lead Channel Setting Pacing Pulse Width: 0.4 ms
Lead Channel Setting Sensing Sensitivity: 5.6 mV

## 2021-09-02 NOTE — Progress Notes (Signed)
Remote pacemaker transmission.   

## 2021-09-23 ENCOUNTER — Other Ambulatory Visit (HOSPITAL_BASED_OUTPATIENT_CLINIC_OR_DEPARTMENT_OTHER): Payer: Self-pay

## 2021-09-23 MED ORDER — INFLUENZA VAC SPLIT QUAD 0.5 ML IM SUSY
PREFILLED_SYRINGE | INTRAMUSCULAR | 0 refills | Status: DC
  Filled 2021-09-23: qty 0.5, 1d supply, fill #0

## 2021-09-28 ENCOUNTER — Telehealth: Payer: Self-pay | Admitting: Family Medicine

## 2021-09-28 NOTE — Telephone Encounter (Signed)
Patient was not home, caller req CB later date

## 2021-10-01 ENCOUNTER — Observation Stay (HOSPITAL_COMMUNITY)
Admission: EM | Admit: 2021-10-01 | Discharge: 2021-10-02 | Disposition: A | Discharge status: Home / Self Care | Payer: Medicare HMO | Attending: Family Medicine | Admitting: Family Medicine

## 2021-10-01 ENCOUNTER — Ambulatory Visit
Admission: EM | Admit: 2021-10-01 | Discharge: 2021-10-01 | Disposition: A | Discharge status: Home / Self Care | Payer: Medicare HMO | Attending: Emergency Medicine | Admitting: Emergency Medicine

## 2021-10-01 ENCOUNTER — Emergency Department (HOSPITAL_COMMUNITY): Payer: Medicare HMO

## 2021-10-01 ENCOUNTER — Encounter (HOSPITAL_COMMUNITY): Payer: Self-pay | Admitting: Emergency Medicine

## 2021-10-01 ENCOUNTER — Other Ambulatory Visit: Payer: Self-pay

## 2021-10-01 DIAGNOSIS — Z79899 Other long term (current) drug therapy: Secondary | ICD-10-CM | POA: Diagnosis not present

## 2021-10-01 DIAGNOSIS — R001 Bradycardia, unspecified: Secondary | ICD-10-CM | POA: Diagnosis present

## 2021-10-01 DIAGNOSIS — L03114 Cellulitis of left upper limb: Secondary | ICD-10-CM | POA: Diagnosis not present

## 2021-10-01 DIAGNOSIS — L089 Local infection of the skin and subcutaneous tissue, unspecified: Secondary | ICD-10-CM | POA: Diagnosis not present

## 2021-10-01 DIAGNOSIS — X58XXXA Exposure to other specified factors, initial encounter: Secondary | ICD-10-CM | POA: Diagnosis not present

## 2021-10-01 DIAGNOSIS — Z950 Presence of cardiac pacemaker: Secondary | ICD-10-CM | POA: Diagnosis not present

## 2021-10-01 DIAGNOSIS — M19032 Primary osteoarthritis, left wrist: Secondary | ICD-10-CM | POA: Diagnosis not present

## 2021-10-01 DIAGNOSIS — I159 Secondary hypertension, unspecified: Secondary | ICD-10-CM

## 2021-10-01 DIAGNOSIS — S50912A Unspecified superficial injury of left forearm, initial encounter: Secondary | ICD-10-CM | POA: Insufficient documentation

## 2021-10-01 DIAGNOSIS — E039 Hypothyroidism, unspecified: Secondary | ICD-10-CM | POA: Diagnosis present

## 2021-10-01 DIAGNOSIS — J449 Chronic obstructive pulmonary disease, unspecified: Secondary | ICD-10-CM | POA: Diagnosis present

## 2021-10-01 DIAGNOSIS — S51802A Unspecified open wound of left forearm, initial encounter: Secondary | ICD-10-CM | POA: Diagnosis not present

## 2021-10-01 DIAGNOSIS — Z23 Encounter for immunization: Secondary | ICD-10-CM | POA: Diagnosis not present

## 2021-10-01 DIAGNOSIS — N179 Acute kidney failure, unspecified: Secondary | ICD-10-CM | POA: Insufficient documentation

## 2021-10-01 DIAGNOSIS — I891 Lymphangitis: Secondary | ICD-10-CM | POA: Diagnosis not present

## 2021-10-01 DIAGNOSIS — I509 Heart failure, unspecified: Secondary | ICD-10-CM | POA: Diagnosis not present

## 2021-10-01 DIAGNOSIS — I110 Hypertensive heart disease with heart failure: Secondary | ICD-10-CM | POA: Diagnosis not present

## 2021-10-01 DIAGNOSIS — T148XXA Other injury of unspecified body region, initial encounter: Secondary | ICD-10-CM | POA: Diagnosis not present

## 2021-10-01 DIAGNOSIS — S61502A Unspecified open wound of left wrist, initial encounter: Secondary | ICD-10-CM | POA: Diagnosis not present

## 2021-10-01 DIAGNOSIS — I10 Essential (primary) hypertension: Secondary | ICD-10-CM | POA: Diagnosis present

## 2021-10-01 DIAGNOSIS — F17210 Nicotine dependence, cigarettes, uncomplicated: Secondary | ICD-10-CM | POA: Insufficient documentation

## 2021-10-01 DIAGNOSIS — R590 Localized enlarged lymph nodes: Secondary | ICD-10-CM

## 2021-10-01 DIAGNOSIS — K219 Gastro-esophageal reflux disease without esophagitis: Secondary | ICD-10-CM

## 2021-10-01 LAB — CBC WITH DIFFERENTIAL/PLATELET
Abs Immature Granulocytes: 0.06 10*3/uL (ref 0.00–0.07)
Basophils Absolute: 0.1 10*3/uL (ref 0.0–0.1)
Basophils Relative: 0 %
Eosinophils Absolute: 0 10*3/uL (ref 0.0–0.5)
Eosinophils Relative: 0 %
HCT: 41.3 % (ref 39.0–52.0)
Hemoglobin: 14.2 g/dL (ref 13.0–17.0)
Immature Granulocytes: 0 %
Lymphocytes Relative: 4 %
Lymphs Abs: 0.5 10*3/uL — ABNORMAL LOW (ref 0.7–4.0)
MCH: 33.1 pg (ref 26.0–34.0)
MCHC: 34.4 g/dL (ref 30.0–36.0)
MCV: 96.3 fL (ref 80.0–100.0)
Monocytes Absolute: 0.9 10*3/uL (ref 0.1–1.0)
Monocytes Relative: 7 %
Neutro Abs: 12.1 10*3/uL — ABNORMAL HIGH (ref 1.7–7.7)
Neutrophils Relative %: 89 %
Platelets: 275 10*3/uL (ref 150–400)
RBC: 4.29 MIL/uL (ref 4.22–5.81)
RDW: 11.9 % (ref 11.5–15.5)
WBC: 13.7 10*3/uL — ABNORMAL HIGH (ref 4.0–10.5)
nRBC: 0 % (ref 0.0–0.2)

## 2021-10-01 LAB — BASIC METABOLIC PANEL
Anion gap: 8 (ref 5–15)
BUN: 31 mg/dL — ABNORMAL HIGH (ref 6–20)
CO2: 28 mmol/L (ref 22–32)
Calcium: 9 mg/dL (ref 8.9–10.3)
Chloride: 96 mmol/L — ABNORMAL LOW (ref 98–111)
Creatinine, Ser: 1.35 mg/dL — ABNORMAL HIGH (ref 0.61–1.24)
GFR, Estimated: >60 mL/min (ref >60)
Glucose, Bld: 96 mg/dL (ref 70–99)
Potassium: 3.7 mmol/L (ref 3.5–5.1)
Sodium: 132 mmol/L — ABNORMAL LOW (ref 135–145)

## 2021-10-01 LAB — SEDIMENTATION RATE: Sed Rate: 35 mm/hr — ABNORMAL HIGH (ref 0–16)

## 2021-10-01 LAB — LACTIC ACID, PLASMA: Lactic Acid, Venous: 1.4 mmol/L (ref 0.5–1.9)

## 2021-10-01 LAB — C-REACTIVE PROTEIN: CRP: 8.8 mg/dL — ABNORMAL HIGH (ref <1.0)

## 2021-10-01 MED ORDER — TETANUS-DIPHTH-ACELL PERTUSSIS 5-2.5-18.5 LF-MCG/0.5 IM SUSY
0.5000 mL | PREFILLED_SYRINGE | Freq: Once | INTRAMUSCULAR | Status: AC
  Administered 2021-10-01: 0.5 mL via INTRAMUSCULAR
  Filled 2021-10-01: qty 0.5

## 2021-10-01 MED ORDER — DIPHENHYDRAMINE HCL 50 MG/ML IJ SOLN
12.5000 mg | Freq: Once | INTRAMUSCULAR | Status: AC
  Administered 2021-10-01: 12.5 mg via INTRAVENOUS
  Filled 2021-10-01: qty 1

## 2021-10-01 MED ORDER — ENOXAPARIN SODIUM 40 MG/0.4ML IJ SOSY: 40.0000 mg | PREFILLED_SYRINGE | INTRAMUSCULAR | Status: DC

## 2021-10-01 MED ORDER — ALBUTEROL SULFATE (2.5 MG/3ML) 0.083% IN NEBU: 2.5000 mg | INHALATION_SOLUTION | RESPIRATORY_TRACT | Status: DC | PRN

## 2021-10-01 MED ORDER — ACETAMINOPHEN 325 MG PO TABS: 650.0000 mg | ORAL_TABLET | Freq: Four times a day (QID) | ORAL | Status: DC | PRN

## 2021-10-01 MED ORDER — VANCOMYCIN HCL 1250 MG/250ML IV SOLN
1250.0000 mg | Freq: Once | INTRAVENOUS | Status: AC
  Administered 2021-10-01: 1250 mg via INTRAVENOUS
  Filled 2021-10-01: qty 250

## 2021-10-01 MED ORDER — SODIUM CHLORIDE 0.9 % IV BOLUS
500.0000 mL | Freq: Once | INTRAVENOUS | Status: AC
  Administered 2021-10-01: 500 mL via INTRAVENOUS

## 2021-10-01 MED ORDER — ACETAMINOPHEN 650 MG RE SUPP: 650.0000 mg | Freq: Four times a day (QID) | RECTAL | Status: DC | PRN

## 2021-10-01 MED ORDER — SODIUM CHLORIDE 0.9 % IV SOLN
500.0000 mg | INTRAVENOUS | Status: DC
  Administered 2021-10-01: 500 mg via INTRAVENOUS
  Filled 2021-10-01: qty 5

## 2021-10-01 MED ORDER — TIOTROPIUM BROMIDE MONOHYDRATE 2.5 MCG/ACT IN AERS: 2.0000 | INHALATION_SPRAY | Freq: Every day | RESPIRATORY_TRACT | Status: DC

## 2021-10-01 MED ORDER — UMECLIDINIUM BROMIDE 62.5 MCG/ACT IN AEPB
1.0000 | INHALATION_SPRAY | Freq: Every day | RESPIRATORY_TRACT | Status: DC
  Administered 2021-10-02: 1 via RESPIRATORY_TRACT
  Filled 2021-10-01: qty 7

## 2021-10-01 MED ORDER — MOMETASONE FURO-FORMOTEROL FUM 200-5 MCG/ACT IN AERO
2.0000 | INHALATION_SPRAY | Freq: Two times a day (BID) | RESPIRATORY_TRACT | Status: DC
  Administered 2021-10-01 – 2021-10-02 (×2): 2 via RESPIRATORY_TRACT
  Filled 2021-10-01: qty 8.8

## 2021-10-01 MED ORDER — PANTOPRAZOLE SODIUM 40 MG PO TBEC
40.0000 mg | DELAYED_RELEASE_TABLET | Freq: Every day | ORAL | Status: DC
  Administered 2021-10-02: 40 mg via ORAL
  Filled 2021-10-01: qty 1

## 2021-10-01 MED ORDER — DIPHENHYDRAMINE HCL 25 MG PO CAPS
50.0000 mg | ORAL_CAPSULE | Freq: Four times a day (QID) | ORAL | Status: DC | PRN
  Administered 2021-10-01 – 2021-10-02 (×3): 50 mg via ORAL
  Filled 2021-10-01 (×3): qty 2

## 2021-10-01 MED ORDER — MIDODRINE HCL 5 MG PO TABS
10.0000 mg | ORAL_TABLET | Freq: Three times a day (TID) | ORAL | Status: DC
  Administered 2021-10-01 – 2021-10-02 (×3): 10 mg via ORAL
  Filled 2021-10-01 (×3): qty 2

## 2021-10-01 MED ORDER — VANCOMYCIN HCL IN DEXTROSE 1-5 GM/200ML-% IV SOLN: 1000.0000 mg | INTRAVENOUS | Status: DC

## 2021-10-01 MED ORDER — FLUOXETINE HCL 20 MG PO CAPS
80.0000 mg | ORAL_CAPSULE | Freq: Every day | ORAL | Status: DC
  Administered 2021-10-02: 80 mg via ORAL
  Filled 2021-10-01: qty 4

## 2021-10-01 MED ORDER — PANTOPRAZOLE SODIUM 40 MG PO TBEC: 40.0000 mg | DELAYED_RELEASE_TABLET | Freq: Every day | ORAL | Status: DC

## 2021-10-01 NOTE — Progress Notes (Signed)
Pharmacy Antibiotic Note  Michael [name] is a 61 y.o. male admitted on 10/01/2021 with cellulitis.  Pharmacy has been consulted for vancomycin dosing.  Plan: Vancomycin 1000 mg IV every 24 hours Monitor labs, c/s, and vanco level as indicated.  Height: 5\' 9"  (175.3 cm) Weight: 58.7 kg (129 lb 6.4 oz) IBW/kg (Calculated) : 70.7  Temp (24hrs), Avg:97.6 F (36.4 C), Min:97.3 F (36.3 C), Max:97.9 F (36.6 C)  Recent Labs  Lab 10/01/21 1413  WBC 13.7*  CREATININE 1.35*  LATICACIDVEN 1.4    Estimated Creatinine Clearance: 48.3 mL/min (A) (by C-G formula based on SCr of 1.35 mg/dL (H)).    Allergies  Allergen Reactions   Shellfish Allergy Anaphylaxis    Antimicrobials this admission: Vanco 9/29 >>   Microbiology results: 9/29 BCx: pending   Thank you for allowing pharmacy to be a part of this patient's care.  Ramond Craver 10/01/2021 3:29 PM

## 2021-10-01 NOTE — ED Triage Notes (Signed)
Pt reports he has wounds to left hand and arm x 6 days ago after working on his car, pt reports redness, warmth, swelling and drainage noted to arm, was sent by PCP for IV abx; pt further endorses yellow and black emesis x 2 nights ago, reports fever and chills

## 2021-10-01 NOTE — Discharge Instructions (Addendum)
Concerned that you have an advanced infection that may require IV antibiotics and observation.  I am sending you to the ER for further evaluation and treatment.  Please go there immediately

## 2021-10-01 NOTE — H&P (Signed)
[facility] H&P   Patient Demographics:    Michael [name], is a 61 y.o. male  MRN: [id number]   DOB - [birth year]  Admit Date - 10/01/2021  Outpatient Primary MD for the patient is Vivi Barrack, MD  Referring MD/NP/PA: Merlene Laughter  Patient coming from: home  Chief Complaint  Patient presents with   Wound Infection      HPI:    Michael [name]  is a 61 y.o. male, with past medical history for COPD stage III, seizure disorder, hypertension, hypothyroidism, GERD, HF status post pacemaker, patient presents to ED secondary to left arm infection, patient reports Saturday he was working on his car, and scraped the posterior aspect of his hand, then he developed a bullae where he picked on it, where he noticed new ulcers/lesions on the arm, becoming more painful and red, as well he noted streaking marking his arm, reports he was able to squeeze some pus out of his wound, he has been feeling better, does report some fever, denies any chills, so he come to ED for further evaluation, patient report he has cat and dog at home, but denies any cat or dog bite or scratch. -ED his work-up significant for sodium of 132, creatinine of 1.35, CRP mildly elevated at 8.8, white blood cell elevated at 13.7, blood cultures were sent, patient was afebrile, [facility] consulted to admit   Review of systems:   A full 10 point Review of Systems was done, except as stated above, all other Review of Systems were negative.   With Past History of the following :    Past Medical History:  Diagnosis Date   Allergy    Anginal pain (HCC)    Anxiety    Arthritis    Bradycardia    CHF (congestive heart failure) (HCC)    Chronic chest pain    COPD (chronic obstructive pulmonary disease) (HCC)    Depression    Emphysema of lung (HCC)    GERD (gastroesophageal reflux disease)    History of nuclear stress test 06/14/2011    lexiscan; mild perfusion defect in basal inferosetpal & apical inferior region; negative for ischemia    Hypothyroidism    Pacemaker    Pneumonia    Presence of permanent cardiac pacemaker April 2005   For bradycardia   Seizure disorder ([place])    Seizures ([place])    as a baby   Shortness of breath    Tobacco abuse       Past Surgical History:  Procedure Laterality Date   CARDIAC CATHETERIZATION  2002, 2003   normal coronaries   EYE SURGERY  [age] y.o.   GALLBLADDER SURGERY  2003   HAND SURGERY  1998   x3   OPEN REDUCTION INTERNAL FIXATION (ORIF) DISTAL RADIAL FRACTURE Right 09/07/2016   Procedure: RIGHT WRIST OPEN REDUCTION INTERNAL FIXATION (ORIF) REPAIR AS INDICATED;  Surgeon: Caralyn Guile,  Josph Macho, MD;  Location: [place];  Service: Orthopedics;  Laterality: Right;   PACEMAKER INSERTION  04/23/2003   Medtronic Kappa; [facility] - symptomatic bradycardia   PERMANENT PACEMAKER GENERATOR CHANGE N/A 09/06/2012   Procedure: PERMANENT PACEMAKER GENERATOR CHANGE;  Surgeon: Sanda Klein, MD;  Location: [facility];  Service: Cardiovascular;  Laterality: N/A;   TONSILLECTOMY     TRANSTHORACIC ECHOCARDIOGRAM  05/14/2012   EF 99991111, normal systolic function; mild MR (ordered for mitral valve disease 424.0)      Social History:     Social History   Tobacco Use   Smoking status: Every Day    Packs/day: 1.00    Years: 40.00    Total pack years: 40.00    Types: Cigarettes   Smokeless tobacco: Never  Substance Use Topics   Alcohol use: No      Family History :     Family History  Problem Relation Age of Onset   Heart Problems Mother        MVP   Heart disease Mother    CAD Father    COPD Father    Heart disease Father    Other Sister        crib dealth   Heart attack Maternal Grandmother    Hypertension Maternal Grandmother    Heart attack Paternal Grandmother    Hypertension Paternal Grandmother    Heart attack Paternal Grandfather    Hypertension Paternal  Grandfather    Heart Problems Other        aunts x 2 died of heart problems       Home Medications:   Prior to Admission medications   Medication Sig Start Date End Date Taking? Authorizing Provider  albuterol (ACCUNEB) 1.25 MG/3ML nebulizer solution 2.5 mg every 4 hours if needed 04/15/21  Yes Tanda Rockers, MD  FLUoxetine (PROZAC) 40 MG capsule Take 2 capsules (80 mg total) by mouth daily. 08/02/21  Yes Vivi Barrack, MD  fluticasone [facility]) 50 MCG/ACT nasal spray Place 2 sprays into both nostrils as needed.   Yes [provider]  ibuprofen (ADVIL) 800 MG tablet Take 1 tablet (800 mg total) by mouth every 8 (eight) hours as needed. 08/02/21  Yes Vivi Barrack, MD  losartan-hydrochlorothiazide [facility]) 50-12.5 MG tablet Take 1 tablet by mouth daily. 05/17/21  Yes Croitoru, Mihai, MD  omeprazole (PRILOSEC) 20 MG capsule Take 1 capsule (20 mg total) by mouth 2 (two) times daily. 08/02/21  Yes Vivi Barrack, MD  SYMBICORT 160-4.5 MCG/ACT inhaler Take 2 puffs first thing in am and then another 2 puffs about 12 hours later. 04/15/21  Yes Tanda Rockers, MD  Tiotropium Bromide Monohydrate (SPIRIVA RESPIMAT) 2.5 MCG/ACT AERS Inhale 2 puffs into the lungs daily. 04/15/21  Yes Tanda Rockers, MD  VENTOLIN HFA 108 (90 Base) MCG/ACT inhaler INHALE UP TO  2 PUFFS BY MOUTH EVERY 4 HOURS AS NEEDED 04/15/21  Yes Tanda Rockers, MD  EPINEPHrine 0.3 mg/0.3 mL IJ SOAJ injection     [provider]  influenza vac split quadrivalent PF (FLUARIX) 0.5 ML injection Inject into the muscle. Patient not taking: Reported on 10/01/2021 09/23/21   Carlyle Basques, MD  Vitamin D, Ergocalciferol, (DRISDOL) 1.25 MG (50000 UNIT) CAPS capsule Take 1 capsule (50,000 Units total) by mouth every 7 (seven) days. Patient not taking: Reported on 10/01/2021 06/22/21   Croitoru, Dani Gobble, MD     Allergies:     Allergies  Allergen Reactions  Shellfish Allergy Anaphylaxis     Physical Exam:    Vitals  Blood pressure 107/68, pulse 75, temperature 97.7 F (36.5 C), temperature source Oral, resp. rate 12, height 5\' 9"  (1.753 m), weight 58.7 kg, SpO2 100 %.   1. General developed male, laying in bed, no apparent distress  2. Normal affect and insight, Not Suicidal or Homicidal, Awake Alert, Oriented X 3.  3. No F.N deficits, ALL C.Nerves Intact, Strength 5/5 all 4 extremities, Sensation intact all 4 extremities, Plantars down going.  4. Ears and Eyes appear Normal, Conjunctivae clear, PERRLA. Moist Oral Mucosa.  5. Supple Neck, No JVD, No cervical lymphadenopathy appriciated, No Carotid Bruits.  6. Symmetrical Chest wall movement, Good air movement bilaterally, CTAB.  7. RRR, No Gallops, Rubs or Murmurs, No Parasternal Heave.  8. Positive Bowel Sounds, Abdomen Soft, No tenderness, No organomegaly appriciated,No rebound -guarding or rigidity.  9.  No Cyanosis, Normal Skin Turgor  10. Good muscle tone,  joints appear normal , no effusions, Normal ROM.  Patient with left arm skin lesions, with some lymphangitis, please see picture below    Data Review:    CBC Recent Labs  Lab 10/01/21 1413  WBC 13.7*  HGB 14.2  HCT 41.3  PLT 275  MCV 96.3  MCH 33.1  MCHC 34.4  RDW 11.9  LYMPHSABS 0.5*  MONOABS 0.9  EOSABS 0.0  BASOSABS 0.1   ------------------------------------------------------------------------------------------------------------------  Chemistries  Recent Labs  Lab 10/01/21 1413  NA 132*  K 3.7  CL 96*  CO2 28  GLUCOSE 96  BUN 31*  CREATININE 1.35*  CALCIUM 9.0   ------------------------------------------------------------------------------------------------------------------ estimated creatinine clearance is 48.3 mL/min (A) (by C-G formula based on SCr of 1.35 mg/dL (H)). ------------------------------------------------------------------------------------------------------------------ No results for input(s): "TSH", "T4TOTAL", "T3FREE",  "THYROIDAB" in the last 72 hours.  Invalid input(s): "FREET3"  Coagulation profile No results for input(s): "INR", "PROTIME" in the last 168 hours. ------------------------------------------------------------------------------------------------------------------- No results for input(s): "DDIMER" in the last 72 hours. -------------------------------------------------------------------------------------------------------------------  Cardiac Enzymes No results for input(s): "CKMB", "TROPONINI", "MYOGLOBIN" in the last 168 hours.  Invalid input(s): "CK" ------------------------------------------------------------------------------------------------------------------ No results found for: "BNP"   ---------------------------------------------------------------------------------------------------------------  Urinalysis    Component Value Date/Time   COLORURINE ORANGE (A) 06/13/2013 2146   APPEARANCEUR CLOUDY (A) 06/13/2013 2146   LABSPEC 1.027 06/13/2013 2146   PHURINE 5.5 06/13/2013 2146   GLUCOSEU NEGATIVE 06/13/2013 2146   HGBUR NEGATIVE 06/13/2013 2146   BILIRUBINUR LARGE (A) 06/13/2013 2146   KETONESUR 15 (A) 06/13/2013 2146   PROTEINUR 30 (A) 06/13/2013 2146   UROBILINOGEN 4.0 (H) 06/13/2013 2146   NITRITE POSITIVE (A) 06/13/2013 2146   LEUKOCYTESUR SMALL (A) 06/13/2013 2146    ----------------------------------------------------------------------------------------------------------------   Imaging Results:    DG Wrist Complete Left  Result Date: 10/01/2021 CLINICAL DATA:  Wound along dorsal aspect of the left wrist. EXAM: LEFT WRIST - COMPLETE 3+ VIEW COMPARISON:  None Available. FINDINGS: There is no acute fracture or dislocation. Bony alignment is normal. There is degenerative change of the triscaphe joint and thumb CMC joint. There is no erosive change. There is mild soft tissue swelling over the dorsum of the wrist small areas of lucency likely corresponding to the  reported laceration. There is no radiopaque foreign body. IMPRESSION: 1. Mild soft tissue swelling of the dorsum of the wrist with small areas of lucency likely corresponding to the reported laceration. No radiopaque foreign body or underlying erosive change. 2. Degenerative changes of the triscaphe joint and thumb CMC joint. Electronically Signed  By: Valetta Mole M.D.   On: 10/01/2021 14:12      Assessment & Plan:    Principal Problem:   Wound infection Active Problems:   Symptomatic bradycardia   Pacemaker - Medtronic 2005, generator change September 2014   COPD GOLD III    Hypothyroidism   HTN (hypertension)  Left arm cellulitis/lymphangitis with ulcerated skin lesions -Initial concern was for wound infection given he scraped his hand in the car, he received TDAP in ED and started on IV vancomycin. -Lesions are ulcerated, and they follow the lymphatics with some evidence of lymphangitis, even though he denies any cat scratch, will cover empirically with azithromycin for cat scratch disease -Follow-up blood cultures. -Orthopedic Dr. Aline Brochure consulted by ED, and he will follow on the patient's during hospital stay.  COPD -Patient currently with no active wheezing, dyspnea or hypoxia -Continue with home medications  Hypertension >> hypotension -Patient with known history of hypertension, blood pressure soft in ED, will hold home medication, continue with gentle hydration  Depression -Continue with home medications  GERD -Continue with PPI  AKI -Creatinine 1.35, continue with gentle hydration and avoid nephrotoxic medications   DVT Prophylaxis Heparin -  Lovenox  AM Labs Ordered, also please review Full Orders  Family Communication: Admission, patients condition and plan of care including tests being ordered have been discussed with the patient who indicate understanding and agree with the plan and Code Status.  Code Status Full  Likely DC to  home  Condition GUARDED     Consults called: Dr Aline Brochure BY ED     Admission status: observation    Time spent in minutes : 68 minutes   Phillips Climes M.D on 10/01/2021 at 5:23 PM   [facility] - Office  [phone]

## 2021-10-01 NOTE — ED Triage Notes (Signed)
Pt presents with several small wounds on left arm that now arrear infected and pt feels unwell

## 2021-10-01 NOTE — ED Provider Notes (Signed)
[facility] EMERGENCY DEPARTMENT Provider Note   CSN: [id number] Arrival date & time: 10/01/21  1222     History  Chief Complaint  Patient presents with   Wound Infection    Michael [name] is a 62 y.o. male.  HPI   Medical history including COPD, CHF, status post pacemaker, hypertension presents with complaints of left arm infection.  He states on Saturday he was working on his car and scraped the posterior aspect of his hand, he states that a few days later he started to notice new ulcers develop on his arm, they become more red and painful, and he states that he was unable to flex or extend his fingers due to pain states that he started to squeeze the pus out of the wounds that I can move his hands better he still has some mild pain with flexion extension at the wrist, he states he had some fevers but denies any chills, not immunocompromise, does not remember last time had a tetanus shot, nondiabetic, no history of MRSA or IV drug use  Sent by urgent care, they are concerned for worsening cellulitis of the left arm requiring IV antibiotics.  Home Medications Prior to Admission medications   Medication Sig Start Date End Date Taking? Authorizing Provider  albuterol (ACCUNEB) 1.25 MG/3ML nebulizer solution 2.5 mg every 4 hours if needed 04/15/21   [name], MD  FLUoxetine (PROZAC) 40 MG capsule Take 2 capsules (80 mg total) by mouth daily. 08/02/21   [name], MD  fluticasone [facility]) 50 MCG/ACT nasal spray Place 2 sprays into both nostrils as needed.    [provider]  ibuprofen (ADVIL) 800 MG tablet Take 1 tablet (800 mg total) by mouth every 8 (eight) hours as needed. 08/02/21   [name], MD  influenza vac split quadrivalent PF (FLUARIX) 0.5 ML injection Inject into the muscle. 09/23/21   Judyann Munson, MD  losartan-hydrochlorothiazide (HYZAAR) 50-12.5 MG tablet Take 1 tablet by mouth daily. 05/17/21   Croitoru, Mihai, MD  Multiple Vitamin (MULTIVITAMIN  WITH MINERALS) TABS tablet Take 1 tablet by mouth daily.    [provider]  omeprazole (PRILOSEC) 20 MG capsule Take 1 capsule (20 mg total) by mouth 2 (two) times daily. 08/02/21   [name], MD  SYMBICORT 160-4.5 MCG/ACT inhaler Take 2 puffs first thing in am and then another 2 puffs about 12 hours later. 04/15/21   [name], MD  Tiotropium Bromide Monohydrate (SPIRIVA RESPIMAT) 2.5 MCG/ACT AERS Inhale 2 puffs into the lungs daily. 04/15/21   [name], MD  VENTOLIN HFA 108 (90 Base) MCG/ACT inhaler INHALE UP TO  2 PUFFS BY MOUTH EVERY 4 HOURS AS NEEDED 04/15/21   [name], MD  Vitamin D, Ergocalciferol, (DRISDOL) 1.25 MG (50000 UNIT) CAPS capsule Take 1 capsule (50,000 Units total) by mouth every 7 (seven) days. 06/22/21   Croitoru, Mihai, MD      Allergies    Shellfish allergy    Review of Systems   Review of Systems  Constitutional:  Negative for chills and fever.  Respiratory:  Negative for shortness of breath.   Cardiovascular:  Negative for chest pain.  Gastrointestinal:  Negative for abdominal pain.  Skin:  Positive for wound.  Neurological:  Negative for headaches.    Physical Exam Updated Vital Signs BP (!) 87/65 (BP Location: Right Arm)   Pulse 94   Temp 97.9 F (36.6 C) (Oral)   Resp 19  Ht 5\' 9"  (1.753 m)   Wt 58.7 kg   SpO2 96%   BMI 19.11 kg/m  Physical Exam Vitals and nursing note reviewed.  Constitutional:      General: He is not in acute distress.    Appearance: He is not ill-appearing.  HENT:     Head: Normocephalic and atraumatic.     Nose: No congestion.  Eyes:     Conjunctiva/sclera: Conjunctivae normal.  Cardiovascular:     Rate and Rhythm: Normal rate and regular rhythm.     Pulses: Normal pulses.  Pulmonary:     Effort: Pulmonary effort is normal.  Musculoskeletal:     Comments: Focused exam of the left arm reveals streaking on the left upper forearm, 4 ulcers noted in the dorsum of the forearm and wrist, with  surrounding erythema no drainage or discharge present, area is warm to the touch compartments are soft, full range of motion his fingers, neurovascular fully intact, significant pain with flexion extension of the wrist.  Please see picture for full detail  Skin:    General: Skin is warm and dry.  Neurological:     Mental Status: He is alert.  Psychiatric:        Mood and Affect: Mood normal.      ED Results / Procedures / Treatments   Labs (all labs ordered are listed, but only abnormal results are displayed) Labs Reviewed  BASIC METABOLIC PANEL  CBC WITH DIFFERENTIAL/PLATELET  LACTIC ACID, PLASMA  LACTIC ACID, PLASMA    EKG None  Radiology No results found.  Procedures Procedures    Medications Ordered in ED Medications - No data to display  ED Course/ Medical Decision Making/ A&P                           Medical Decision Making Amount and/or Complexity of Data Reviewed Labs: ordered. Radiology: ordered.  Risk Prescription drug management. Decision regarding hospitalization.  This patient presents to the ED for concern of left arm infection, this involves an extensive number of treatment options, and is a complaint that carries with it a high risk of complications and morbidity.  The differential diagnosis includes cellulitis, abscess, osteomyelitis, tenosynovitis, septic wrist    Additional history obtained:  Additional history obtained from wife at bedside External records from outside source obtained and reviewed including urgent care notes   Co morbidities that complicate the patient evaluation  COPD, CHF  Social Determinants of Health:  N/A    Lab Tests:  I Ordered, and personally interpreted labs.  The pertinent results include: CBC shows leukocytosis of 13.7, BMP shows sodium 132 chloride 96 BUN 31 creatinine 1.3, sed rate is 35 lactic 1.4   Imaging Studies ordered:  I ordered imaging studies including DG of left wrist I independently  visualized and interpreted imaging which showed swelling of the wrist without acute abnormalities. I agree with the radiologist interpretation   Cardiac Monitoring:  The patient was maintained on a cardiac monitor.  I personally viewed and interpreted the cardiac monitored which showed an underlying rhythm of: N/A   Medicines ordered and prescription drug management:  I ordered medication including vancomycin I have reviewed the patients home medicines and have made adjustments as needed  Critical Interventions:  N/A   Reevaluation:  Presents with concerns of left arm infection, on my exam he has normal swelling on his left arm with evidence of overlying cellulitis, he has pain with flexion extension  of the wrist, concern for possible tenosynovitis versus septic wrist, will obtain lab work imaging and reassess.  Patient elevated sed rate as well as leukocytosis, concern for possible tenosynovitis, will start him on IV antibiotics.  Patient is reassessed resting comfortably, I recommend admission for IV antibiotics he is agreement this plan will consult hospitalist team for admission.    Consultations Obtained:  I requested consultation with the spoke with Dr. Romeo [facility] orth[facility],  and discussed lab and imaging findings as well as pertinent plan - they recommend: Agreement with IV antibiotics and admission will be available for consultation. Spoke with Dr. Randol Kern will admit the patient.     Test Considered:  N/A    Rule out My suspicion for sepsis is low at this time, he is nontoxic-appearing, vital signs are reassuring, lactic is 1.4, he is nontachycardic, he is noted to be hypotensive, but states that he had new changes blood pressure medication I suspect is likely the cause.   Low suspicion for fracture or dislocation as x-ray does not feel any significant findings.  Low suspicion for compartment syndrome as area was palpated it was soft to the touch, neurovascular  fully intact.     Dispostion and problem list  After consideration of the diagnostic results and the patients response to treatment, I feel that the patent would benefit from admission.  Cellulitis-likely a cellulitis but I am concerned for possible tenosynovitis due to the increased pain with passive flexion extension of the wrist, as well as elevated sed rate, started on vancomycin for likely MRSA infection need formal consultation by Ortho for further evaluation.               Final Clinical Impression(s) / ED Diagnoses Final diagnoses:  None    Rx / DC Orders ED Discharge Orders     None         Carroll Sage, PA-C 10/01/21 1930    Glendora Score, MD 10/03/21 1147

## 2021-10-01 NOTE — ED Provider Notes (Signed)
HPI  SUBJECTIVE:  Michael [name] is a 61 y.o. male who presents with left forearm swelling, burning pain, erythema streaking proximally after scraping the back of his left hand while working on his truck 6 days ago.  Reports painful lumps in his left axilla, and chills.  No fevers, but he has not checked.  He states that he could not bend his fingers yesterday secondary to the pain, but was able to do so after expressing purulent drainage from the main lesion.  He has several other lesions on forearm, which he states "just came up".  He denies trauma to these areas.  No limitation of motion of the wrist.  He tried hydrogen peroxide, antibiotic ointment, Bactine, Tylenol/ibuprofen and squeezing it.  Squeezing it helps.  Symptoms are worse when his forearm swells.  Last dose of ibuprofen was within 6 hours of evaluation.  Patient has a past medical history of COPD, CHF, status post pacemaker, hypertension, seizures.  Tetanus was updated in 2021.  No history of MRSA, diabetes, chronic kidney disease.  PCP: [facility] [place].  Additional history obtained from mother.  Past Medical History:  Diagnosis Date   Allergy    Anginal pain (HCC)    Anxiety    Arthritis    Bradycardia    CHF (congestive heart failure) (HCC)    Chronic chest pain    COPD (chronic obstructive pulmonary disease) (HCC)    Depression    Emphysema of lung (HCC)    GERD (gastroesophageal reflux disease)    History of nuclear stress test 06/14/2011   lexiscan; mild perfusion defect in basal inferosetpal & apical inferior region; negative for ischemia    Hypothyroidism    Pacemaker    Pneumonia    Presence of permanent cardiac pacemaker April 2005   For bradycardia   Seizure disorder ([place])    Seizures ([place])    as a baby   Shortness of breath    Tobacco abuse     Past Surgical History:  Procedure Laterality Date   CARDIAC CATHETERIZATION  2002, 2003   normal coronaries   EYE SURGERY  [age] y.o.    GALLBLADDER SURGERY  2003   HAND SURGERY  1998   x3   OPEN REDUCTION INTERNAL FIXATION (ORIF) DISTAL RADIAL FRACTURE Right 09/07/2016   Procedure: RIGHT WRIST OPEN REDUCTION INTERNAL FIXATION (ORIF) REPAIR AS INDICATED;  Surgeon: Iran Planas, MD;  Location: [place];  Service: Orthopedics;  Laterality: Right;   PACEMAKER INSERTION  04/23/2003   [employer]; [facility] - symptomatic bradycardia   PERMANENT PACEMAKER GENERATOR CHANGE N/A 09/06/2012   Procedure: PERMANENT PACEMAKER GENERATOR CHANGE;  Surgeon: Sanda Klein, MD;  Location: [facility];  Service: Cardiovascular;  Laterality: N/A;   TONSILLECTOMY     TRANSTHORACIC ECHOCARDIOGRAM  05/14/2012   EF 33-00%, normal systolic function; mild MR (ordered for mitral valve disease 424.0)    Family History  Problem Relation Age of Onset   Heart Problems Mother        MVP   Heart disease Mother    CAD Father    COPD Father    Heart disease Father    Other Sister        crib dealth   Heart attack Maternal Grandmother    Hypertension Maternal Grandmother    Heart attack Paternal Grandmother    Hypertension Paternal Grandmother    Heart attack Paternal Grandfather    Hypertension Paternal Grandfather    Heart  Problems Other        aunts x 2 died of heart problems     Social History   Tobacco Use   Smoking status: Every Day    Packs/day: 1.00    Years: 40.00    Total pack years: 40.00    Types: Cigarettes   Smokeless tobacco: Never  Vaping Use   Vaping Use: Never used  Substance Use Topics   Alcohol use: No   Drug use: Never    No current facility-administered medications for this encounter.  Current Outpatient Medications:    albuterol (ACCUNEB) 1.25 MG/3ML nebulizer solution, 2.5 mg every 4 hours if needed, Disp: 150 mL, Rfl: 0   FLUoxetine (PROZAC) 40 MG capsule, Take 2 capsules (80 mg total) by mouth daily., Disp: 180 capsule, Rfl: 3   fluticasone (FLONASE) 50 MCG/ACT nasal spray, Place 2 sprays  into both nostrils as needed., Disp: , Rfl:    ibuprofen (ADVIL) 800 MG tablet, Take 1 tablet (800 mg total) by mouth every 8 (eight) hours as needed., Disp: 30 tablet, Rfl: 11   influenza vac split quadrivalent PF (FLUARIX) 0.5 ML injection, Inject into the muscle., Disp: 0.5 mL, Rfl: 0   losartan-hydrochlorothiazide (HYZAAR) 50-12.5 MG tablet, Take 1 tablet by mouth daily., Disp: 90 tablet, Rfl: 3   Multiple Vitamin (MULTIVITAMIN WITH MINERALS) TABS tablet, Take 1 tablet by mouth daily., Disp: , Rfl:    omeprazole (PRILOSEC) 20 MG capsule, Take 1 capsule (20 mg total) by mouth 2 (two) times daily., Disp: 180 capsule, Rfl: 3   SYMBICORT 160-4.5 MCG/ACT inhaler, Take 2 puffs first thing in am and then another 2 puffs about 12 hours later., Disp: 33 g, Rfl: 3   Tiotropium Bromide Monohydrate (SPIRIVA RESPIMAT) 2.5 MCG/ACT AERS, Inhale 2 puffs into the lungs daily., Disp: 3 each, Rfl: 3   VENTOLIN HFA 108 (90 Base) MCG/ACT inhaler, INHALE UP TO  2 PUFFS BY MOUTH EVERY 4 HOURS AS NEEDED, Disp: 54 g, Rfl: 3   Vitamin D, Ergocalciferol, (DRISDOL) 1.25 MG (50000 UNIT) CAPS capsule, Take 1 capsule (50,000 Units total) by mouth every 7 (seven) days., Disp: 10 capsule, Rfl: 3  Allergies  Allergen Reactions   Shellfish Allergy Anaphylaxis     ROS  As noted in HPI.   Physical Exam  BP 91/61   Pulse 91   Temp (!) 97.3 F (36.3 C)   Resp 20   SpO2 96%   Constitutional: Well developed, well nourished, no acute distress Eyes:  EOMI, conjunctiva normal bilaterally HENT: Normocephalic, atraumatic,mucus membranes moist Respiratory: Normal inspiratory effort Cardiovascular: Normal rate GI: nondistended skin: Multiple scabbed lesions on forearm.  Forearm with tenderness, increased temperature, blanchable erythema.  Positive lymphangitis extending to mid bicep.  RP 2+.        Lymph: Positive left axillary lymphadenopathy Musculoskeletal: no deformities Neurologic: Alert & oriented x 3, no  focal neuro deficits Psychiatric: Speech and behavior appropriate   ED Course   Medications - No data to display  No orders of the defined types were placed in this encounter.   No results found for this or any previous visit (from the past 24 hour(s)). No results found.  ED Clinical Impression  1. Cellulitis of left forearm   2. Lymphangitis of upper extremity   3. Axillary lymphadenopathy      ED Assessment/Plan     Patient is unable to tell me how he got the other lesions on his forearm. He has multiple medical comorbidities.  I am concerned for an advanced infection, or early sepsis. sending to the ED for possible IV antibiotics, labs, and observation. He is afebrile, but he took an antipyretic within the past 6 hours.  He is not tachycardic, his blood pressure has been within his range previously, so I think he is stable to go by private vehicle.   Discussed rationale for transfer to the emergency department with patient and parent.  Parent agrees with plan.  No orders of the defined types were placed in this encounter.     *This clinic note was created using Dragon dictation software. Therefore, there may be occasional mistakes despite careful proofreading.  ?    Melynda Ripple, MD 10/01/21 [id number]

## 2021-10-02 DIAGNOSIS — T148XXA Other injury of unspecified body region, initial encounter: Secondary | ICD-10-CM | POA: Diagnosis not present

## 2021-10-02 DIAGNOSIS — L089 Local infection of the skin and subcutaneous tissue, unspecified: Secondary | ICD-10-CM | POA: Diagnosis not present

## 2021-10-02 LAB — BASIC METABOLIC PANEL
Anion gap: 5 (ref 5–15)
BUN: 23 mg/dL — ABNORMAL HIGH (ref 6–20)
CO2: 28 mmol/L (ref 22–32)
Calcium: 8.2 mg/dL — ABNORMAL LOW (ref 8.9–10.3)
Chloride: 102 mmol/L (ref 98–111)
Creatinine, Ser: 1.13 mg/dL (ref 0.61–1.24)
GFR, Estimated: >60 mL/min (ref >60)
Glucose, Bld: 93 mg/dL (ref 70–99)
Potassium: 3.3 mmol/L — ABNORMAL LOW (ref 3.5–5.1)
Sodium: 135 mmol/L (ref 135–145)

## 2021-10-02 LAB — HIV ANTIBODY (ROUTINE TESTING W REFLEX): HIV Screen 4th Generation wRfx: NONREACTIVE

## 2021-10-02 LAB — CBC
HCT: 36.8 % — ABNORMAL LOW (ref 39.0–52.0)
Hemoglobin: 12.6 g/dL — ABNORMAL LOW (ref 13.0–17.0)
MCH: 33.3 pg (ref 26.0–34.0)
MCHC: 34.2 g/dL (ref 30.0–36.0)
MCV: 97.4 fL (ref 80.0–100.0)
Platelets: 247 10*3/uL (ref 150–400)
RBC: 3.78 MIL/uL — ABNORMAL LOW (ref 4.22–5.81)
RDW: 12 % (ref 11.5–15.5)
WBC: 9.7 10*3/uL (ref 4.0–10.5)
nRBC: 0 % (ref 0.0–0.2)

## 2021-10-02 MED ORDER — POTASSIUM CHLORIDE CRYS ER 20 MEQ PO TBCR
40.0000 meq | EXTENDED_RELEASE_TABLET | Freq: Once | ORAL | Status: AC
  Administered 2021-10-02: 40 meq via ORAL
  Filled 2021-10-02: qty 2

## 2021-10-02 MED ORDER — VITAMIN D (ERGOCALCIFEROL) 1.25 MG (50000 UNIT) PO CAPS: 50000.0000 [IU] | ORAL_CAPSULE | ORAL | 3 refills | Status: DC

## 2021-10-02 MED ORDER — CEPHALEXIN 500 MG PO CAPS: 500.0000 mg | ORAL_CAPSULE | Freq: Three times a day (TID) | ORAL | 0 refills | Status: AC

## 2021-10-02 MED ORDER — VANCOMYCIN HCL IN DEXTROSE 1-5 GM/200ML-% IV SOLN
1000.0000 mg | Freq: Once | INTRAVENOUS | Status: AC
  Administered 2021-10-02: 1000 mg via INTRAVENOUS
  Filled 2021-10-02: qty 200

## 2021-10-02 MED ORDER — ALBUTEROL SULFATE 1.25 MG/3ML IN NEBU: INHALATION_SOLUTION | RESPIRATORY_TRACT | 3 refills | Status: DC

## 2021-10-02 MED ORDER — SPIRIVA RESPIMAT 2.5 MCG/ACT IN AERS: 2.0000 | INHALATION_SPRAY | Freq: Every day | RESPIRATORY_TRACT | 5 refills | Status: DC

## 2021-10-02 MED ORDER — AZITHROMYCIN 250 MG PO TABS
500.0000 mg | ORAL_TABLET | Freq: Once | ORAL | Status: AC
  Administered 2021-10-02: 500 mg via ORAL
  Filled 2021-10-02: qty 2

## 2021-10-02 MED ORDER — SYMBICORT 160-4.5 MCG/ACT IN AERO: INHALATION_SPRAY | RESPIRATORY_TRACT | 5 refills | Status: DC

## 2021-10-02 MED ORDER — VENTOLIN HFA 108 (90 BASE) MCG/ACT IN AERS: INHALATION_SPRAY | RESPIRATORY_TRACT | 3 refills | Status: DC

## 2021-10-02 MED ORDER — OMEPRAZOLE 20 MG PO CPDR: 20.0000 mg | DELAYED_RELEASE_CAPSULE | Freq: Every day | ORAL | 3 refills | Status: DC

## 2021-10-02 MED ORDER — DOXYCYCLINE HYCLATE 100 MG PO TABS: 100.0000 mg | ORAL_TABLET | Freq: Two times a day (BID) | ORAL | 0 refills | Status: AC

## 2021-10-02 MED ORDER — MIDODRINE HCL 5 MG PO TABS: 5.0000 mg | ORAL_TABLET | Freq: Two times a day (BID) | ORAL | 3 refills | Status: DC

## 2021-10-02 NOTE — Plan of Care (Signed)
  Problem: Education: Goal: Knowledge of General Education information will improve Description Including pain rating scale, medication(s)/side effects and non-pharmacologic comfort measures Outcome: Progressing   Problem: Health Behavior/Discharge Planning: Goal: Ability to manage health-related needs will improve Outcome: Progressing   

## 2021-10-02 NOTE — Discharge Summary (Signed)
Michael [name], is a 61 y.o. male  DOB Oct 16, 1960  MRN [id number].  Admission date:  10/01/2021  Admitting Physician  Albertine Patricia, MD  Discharge Date:  10/02/2021   Primary MD  Vivi Barrack, MD  Recommendations for primary care physician for things to follow:   1)Please Stop Losartan/HCTZ due to low blood pressure 2) please take midodrine 5 mg twice a day to keep your blood pressure from dropping lower 3) please follow-up with your primary care physician within a week for blood pressure recheck 4) please take Keflex/cephalexin and doxycycline antibiotics as prescribed for left arm infection 5) please follow-up with your primary care physician within a week for left arm cellulitis/infection recheck  Admission Diagnosis  Wound infection [T14.8XXA, L08.9] Cellulitis of left upper extremity [L03.114]   Discharge Diagnosis  Wound infection [T14.8XXA, L08.9] Cellulitis of left upper extremity [L03.114]    Principal Problem:   Wound infection Active Problems:   Symptomatic bradycardia   Pacemaker - Medtronic 2005, generator change September 2014   COPD GOLD III    Hypothyroidism   HTN (hypertension)      Past Medical History:  Diagnosis Date   Allergy    Anginal pain (HCC)    Anxiety    Arthritis    Bradycardia    CHF (congestive heart failure) (HCC)    Chronic chest pain    COPD (chronic obstructive pulmonary disease) (HCC)    Depression    Emphysema of lung (HCC)    GERD (gastroesophageal reflux disease)    History of nuclear stress test 06/14/2011   lexiscan; mild perfusion defect in basal inferosetpal & apical inferior region; negative for ischemia    Hypothyroidism    Pacemaker    Pneumonia    Presence of permanent cardiac pacemaker April 2005   For bradycardia   Seizure disorder ([place])    Seizures ([place])    as a baby   Shortness of breath    Tobacco abuse     Past Surgical  History:  Procedure Laterality Date   CARDIAC CATHETERIZATION  2002, 2003   normal coronaries   EYE SURGERY  [age] y.o.   GALLBLADDER SURGERY  2003   HAND SURGERY  1998   x3   OPEN REDUCTION INTERNAL FIXATION (ORIF) DISTAL RADIAL FRACTURE Right 09/07/2016   Procedure: RIGHT WRIST OPEN REDUCTION INTERNAL FIXATION (ORIF) REPAIR AS INDICATED;  Surgeon: Iran Planas, MD;  Location: [facility];  Service: Orthopedics;  Laterality: Right;   PACEMAKER INSERTION  04/23/2003   Medtronic Kappa; [facility] - symptomatic bradycardia   PERMANENT PACEMAKER GENERATOR CHANGE N/A 09/06/2012   Procedure: PERMANENT PACEMAKER GENERATOR CHANGE;  Surgeon: Sanda Klein, MD;  Location: [facility] CATH LAB;  Service: Cardiovascular;  Laterality: N/A;   TONSILLECTOMY     TRANSTHORACIC ECHOCARDIOGRAM  05/14/2012   EF 99991111, normal systolic function; mild MR (ordered for mitral valve disease 424.0)      HPI  from the history and physical done on the day of admission:  Michael [name]  is a 61 y.o. male, with past medical history for COPD stage III, seizure disorder, hypertension, hypothyroidism, GERD, HF status post pacemaker, patient presents to ED secondary to left arm infection, patient reports Saturday he was working on his car, and scraped the posterior aspect of his hand, then he developed a bullae where he picked on it, where he noticed new ulcers/lesions on the arm, becoming more painful and red, as well he noted streaking marking his arm, reports he was able to squeeze some pus out of his wound, he has been feeling better, does report some fever, denies any chills, so he come to ED for further evaluation, patient report he has cat and dog at home, but denies any cat or dog bite or scratch. -ED his work-up significant for sodium of 132, creatinine of 1.35, CRP mildly elevated at 8.8, white blood cell elevated at 13.7, blood cultures were sent, patient was afebrile, [facility] consulted to admit     Hospital Course:     Assessment and Plan:  1)Left arm/Hand cellulitis/lymphangitis with ulcerated skin lesions--- patient apparently has abrasions from scraping his hand on a car -Treated with IV vancomycin and azithromycin on 10/01/2021 and 10/02/2021 -Received Tdap in the ED -No fevers no chills -Leukocytosis has resolved (WBC down to 9.7 from 13.7 on admission) -No lactic acidosis -Left wrist x-rays without foreign body or of significant findings except for cellulitic findings -Blood cultures NGTD -Okay to discharge on Keflex and doxycycline for strep and staph including MRSA coverage  2) hypokalemia--- due to HCTZ use replaced  3) COPD and tobacco abuse--- smoking cessation advised, -Continue bronchodilators  4)PPM in situ -left chest pacemaker noted, no new concerns  5) soft BP--- losartan/HCTZ discontinued -Continue midodrine -Follow-up with PCP for BP recheck -PCP can consider outpatient echocardiogram and a.m. cortisol levels if blood pressure remains soft despite these measures  6) mild acute anemia--suspect hemodilution related to IV fluids for soft BP and infection -No bleeding concerns at this time  Discharge Condition:   Follow UP   Follow-up Information     Vivi Barrack, MD. Schedule an appointment as soon as possible for a visit in 1 week(s).   Specialty: Family Medicine Why: Blood pressure and left arm cellulitis recheck Contact information: [place] [address] [phone]                 Diet and Activity recommendation:  As advised  Discharge Instructions    Discharge Instructions     Call MD for:  difficulty breathing, headache or visual disturbances   Complete by: As directed    Call MD for:  persistant dizziness or light-headedness   Complete by: As directed    Call MD for:  persistant nausea and vomiting   Complete by: As directed    Call MD for:  severe uncontrolled pain   Complete by: As directed    Call MD  for:  temperature >100.4   Complete by: As directed    Diet - low sodium heart healthy   Complete by: As directed    Discharge instructions   Complete by: As directed    1)Please Stop Losartan/HCTZ due to low blood pressure 2) please take midodrine 5 mg twice a day to keep your blood pressure from dropping lower 3) please follow-up with your primary care physician within a week for blood pressure recheck 4) please take Keflex/cephalexin and doxycycline antibiotics as prescribed for left arm infection 5) please follow-up  with your primary care physician within a week for left arm cellulitis/infection recheck   Increase activity slowly   Complete by: As directed         Discharge Medications     Allergies as of 10/02/2021       Reactions   Shellfish Allergy Anaphylaxis        Medication List     STOP taking these medications    Fluarix Quadrivalent 0.5 ML injection Generic drug: influenza vac split quadrivalent PF   ibuprofen 800 MG tablet Commonly known as: ADVIL   losartan-hydrochlorothiazide 50-12.5 MG tablet Commonly known as: HYZAAR       TAKE these medications    cephALEXin 500 MG capsule Commonly known as: Keflex Take 1 capsule (500 mg total) by mouth 3 (three) times daily for 7 days.   doxycycline 100 MG tablet Commonly known as: VIBRA-TABS Take 1 tablet (100 mg total) by mouth 2 (two) times daily for 7 days.   EPINEPHrine 0.3 mg/0.3 mL Soaj injection Commonly known as: EPI-PEN   FLUoxetine 40 MG capsule Commonly known as: PROZAC Take 2 capsules (80 mg total) by mouth daily.   fluticasone 50 MCG/ACT nasal spray Commonly known as: FLONASE Place 2 sprays into both nostrils as needed.   midodrine 5 MG tablet Commonly known as: PROAMATINE Take 1 tablet (5 mg total) by mouth 2 (two) times daily with a meal. To keep your blood pressure from dropping   omeprazole 20 MG capsule Commonly known as: PRILOSEC Take 1 capsule (20 mg total) by mouth  daily. What changed: when to take this   Spiriva Respimat 2.5 MCG/ACT Aers Generic drug: Tiotropium Bromide Monohydrate Inhale 2 puffs into the lungs daily.   Symbicort 160-4.5 MCG/ACT inhaler Generic drug: budesonide-formoterol Take 2 puffs first thing in am and then another 2 puffs about 12 hours later.   Ventolin HFA 108 (90 Base) MCG/ACT inhaler Generic drug: albuterol INHALE UP TO  2 PUFFS BY MOUTH EVERY 4 HOURS AS NEEDED What changed: Another medication with the same name was changed. Make sure you understand how and when to take each.   albuterol 1.25 MG/3ML nebulizer solution Commonly known as: ACCUNEB 2.5 mg every 4 hours as needed for cough, shortness of breath and/or wheezing What changed: additional instructions   Vitamin D (Ergocalciferol) 1.25 MG (50000 UNIT) Caps capsule Commonly known as: DRISDOL Take 1 capsule (50,000 Units total) by mouth every Sunday. Start taking on: October 03, 2021 What changed: when to take this       Major procedures and Radiology Reports - PLEASE review detailed and final reports for all details, in brief -   DG Wrist Complete Left  Result Date: 10/01/2021 CLINICAL DATA:  Wound along dorsal aspect of the left wrist. EXAM: LEFT WRIST - COMPLETE 3+ VIEW COMPARISON:  None Available. FINDINGS: There is no acute fracture or dislocation. Bony alignment is normal. There is degenerative change of the triscaphe joint and thumb CMC joint. There is no erosive change. There is mild soft tissue swelling over the dorsum of the wrist small areas of lucency likely corresponding to the reported laceration. There is no radiopaque foreign body. IMPRESSION: 1. Mild soft tissue swelling of the dorsum of the wrist with small areas of lucency likely corresponding to the reported laceration. No radiopaque foreign body or underlying erosive change. 2. Degenerative changes of the triscaphe joint and thumb CMC joint. Electronically Signed   By: Valetta Mole M.D.   On:  10/01/2021 14:12  Micro Results   Recent Results (from the past 240 hour(s))  Blood culture (routine x 2)     Status: None (Preliminary result)   Collection Time: 10/01/21  3:45 PM   Specimen: BLOOD RIGHT HAND  Result Value Ref Range Status   Specimen Description   Final    BLOOD RIGHT HAND BOTTLES DRAWN AEROBIC AND ANAEROBIC   Special Requests Blood Culture adequate volume  Final   Culture   Final    NO GROWTH < 12 HOURS Performed at [facility], [address] [place], [place] [address]    Report Status PENDING  Incomplete  Blood culture (routine x 2)     Status: None (Preliminary result)   Collection Time: 10/01/21  3:45 PM   Specimen: Left Antecubital; Blood  Result Value Ref Range Status   Specimen Description   Final    LEFT ANTECUBITAL BOTTLES DRAWN AEROBIC AND ANAEROBIC   Special Requests Blood Culture adequate volume  Final   Culture   Final    NO GROWTH < 12 HOURS Performed at [facility], [address] [place], [place] [address]    Report Status PENDING  Incomplete    Today   Subjective    Michael [name] today has no new complaints No fever  Or chills   No Nausea, Vomiting or Diarrhea -Left arm redness warmth and discomfort is improved significantly overnight    Patient has been seen and examined prior to discharge   Objective   Blood pressure 94/71, pulse 75, temperature 98.8 F (37.1 C), temperature source Oral, resp. rate 20, height 5\' 9"  (1.753 m), weight 58.7 kg, SpO2 96 %.   Intake/Output Summary (Last 24 hours) at 10/02/2021 1213 Last data filed at 10/02/2021 0900 Gross per 24 hour  Intake 1865.72 ml  Output 300 ml  Net 1565.72 ml    Exam Gen:- Awake Alert, no acute distress  HEENT:- .AT, No sclera icterus Neck-Supple Neck,No JVD,.  Lungs-  CTAB , good air movement bilaterally CV- S1, S2 normal, regular, left chest pacemaker in situ Abd-  +ve B.Sounds, Abd Soft, No tenderness,    Extremity/Skin:- No  edema,   good  pulses Psych-affect is appropriate, oriented x3 Neuro-no new focal deficits, no tremors  MSK-left hand/wrist/forearm redness, shallow ulcers and streaking appears to be significantly improved compared to the pictures/photos in epic media section -No drainage, -No significant tenderness or fluctuance   Data Review   CBC w Diff:  Lab Results  Component Value Date   WBC 9.7 10/02/2021   HGB 12.6 (L) 10/02/2021   HCT 36.8 (L) 10/02/2021   PLT 247 10/02/2021   LYMPHOPCT 4 10/01/2021   MONOPCT 7 10/01/2021   EOSPCT 0 10/01/2021   BASOPCT 0 10/01/2021    CMP:  Lab Results  Component Value Date   NA 135 10/02/2021   K 3.3 (L) 10/02/2021   CL 102 10/02/2021   CO2 28 10/02/2021   BUN 23 (H) 10/02/2021   CREATININE 1.13 10/02/2021   CREATININE 1.03 07/28/2015   PROT 6.8 08/02/2021   ALBUMIN 4.2 08/02/2021   BILITOT 0.6 08/02/2021   ALKPHOS 98 08/02/2021   AST 19 08/02/2021   ALT 12 08/02/2021  .  Total Discharge time is about 33 minutes  Roxan Hockey M.D on 10/02/2021 at 12:13 PM  Go to [email or web address] -  for contact info  [facility] - Office  [phone]

## 2021-10-02 NOTE — TOC Progression Note (Signed)
  Transition of Care [facility]) Screening Note   Patient Details  Name: Michael [name] Date of Birth: 01-Sep-1960   Transition of Care [facility]) CM/SW Contact:    Boneta Lucks, RN Phone Number: 10/02/2021, 12:11 PM    Transition of Care Department [facility]) has reviewed patient and no TOC needs have been identified at this time. We will continue to monitor patient advancement through interdisciplinary progression rounds. If new patient transition needs arise, please place a TOC consult.     Expected Discharge Plan: Home/Self Care Barriers to Discharge: No Barriers Identified  Expected Discharge Plan and Services Expected Discharge Plan: Home/Self Care         Expected Discharge Date: 10/02/21

## 2021-10-02 NOTE — Discharge Instructions (Signed)
1)Please Stop Losartan/HCTZ due to low blood pressure 2) please take midodrine 5 mg twice a day to keep your blood pressure from dropping lower 3) please follow-up with your primary care physician within a week for blood pressure recheck 4) please take Keflex/cephalexin and doxycycline antibiotics as prescribed for left arm infection 5) please follow-up with your primary care physician within a week for left arm cellulitis/infection recheck

## 2021-10-02 NOTE — Progress Notes (Signed)
Nsg Discharge Note  Admit Date:  10/01/2021 Discharge date: 10/02/2021   Michael [name] to be D/C'd Home per MD order.  AVS completed.  Removed IV-CDI. Reviewed d/c paperwork with patient and mother. Wheeled stable patient and belongings to main entrance where he was picked up to d/c to home.Patient/caregiver able to verbalize understanding.  Discharge Medication: Allergies as of 10/02/2021       Reactions   Shellfish Allergy Anaphylaxis        Medication List     STOP taking these medications    Fluarix Quadrivalent 0.5 ML injection Generic drug: influenza vac split quadrivalent PF   ibuprofen 800 MG tablet Commonly known as: ADVIL   losartan-hydrochlorothiazide 50-12.5 MG tablet Commonly known as: HYZAAR       TAKE these medications    cephALEXin 500 MG capsule Commonly known as: Keflex Take 1 capsule (500 mg total) by mouth 3 (three) times daily for 7 days.   doxycycline 100 MG tablet Commonly known as: VIBRA-TABS Take 1 tablet (100 mg total) by mouth 2 (two) times daily for 7 days.   EPINEPHrine 0.3 mg/0.3 mL Soaj injection Commonly known as: EPI-PEN   FLUoxetine 40 MG capsule Commonly known as: PROZAC Take 2 capsules (80 mg total) by mouth daily.   fluticasone 50 MCG/ACT nasal spray Commonly known as: FLONASE Place 2 sprays into both nostrils as needed.   midodrine 5 MG tablet Commonly known as: PROAMATINE Take 1 tablet (5 mg total) by mouth 2 (two) times daily with a meal. To keep your blood pressure from dropping   omeprazole 20 MG capsule Commonly known as: PRILOSEC Take 1 capsule (20 mg total) by mouth daily. What changed: when to take this   Spiriva Respimat 2.5 MCG/ACT Aers Generic drug: Tiotropium Bromide Monohydrate Inhale 2 puffs into the lungs daily.   Symbicort 160-4.5 MCG/ACT inhaler Generic drug: budesonide-formoterol Take 2 puffs first thing in am and then another 2 puffs about 12 hours later.   Ventolin HFA 108 (90 Base) MCG/ACT  inhaler Generic drug: albuterol INHALE UP TO  2 PUFFS BY MOUTH EVERY 4 HOURS AS NEEDED What changed: Another medication with the same name was changed. Make sure you understand how and when to take each.   albuterol 1.25 MG/3ML nebulizer solution Commonly known as: ACCUNEB 2.5 mg every 4 hours as needed for cough, shortness of breath and/or wheezing What changed: additional instructions   Vitamin D (Ergocalciferol) 1.25 MG (50000 UNIT) Caps capsule Commonly known as: DRISDOL Take 1 capsule (50,000 Units total) by mouth every Sunday. Start taking on: October 03, 2021 What changed: when to take this        Discharge Assessment: Vitals:   10/02/21 0756 10/02/21 1439  BP:  103/72  Pulse:  65  Resp:    Temp:    SpO2: 96%    Skin clean, dry and intact without evidence of skin break down, no evidence of skin tears noted. IV catheter discontinued intact. Site without signs and symptoms of complications - no redness or edema noted at insertion site, patient denies c/o pain - only slight tenderness at site.  Dressing with slight pressure applied.  D/c Instructions-Education: Discharge instructions given to patient/family with verbalized understanding. D/c education completed with patient/family including follow up instructions, medication list, d/c activities limitations if indicated, with other d/c instructions as indicated by MD - patient able to verbalize understanding, all questions fully answered. Patient instructed to return to ED, call 911, or call MD for any  changes in condition.  Patient escorted via [place], and D/C home via private auto.  Santa Lighter, RN 10/02/2021 3:05 PM

## 2021-10-02 NOTE — Plan of Care (Signed)
  Problem: Education: Goal: Knowledge of General Education information will improve Description: Including pain rating scale, medication(s)/side effects and non-pharmacologic comfort measures 10/02/2021 1348 by Santa Lighter, RN Outcome: Adequate for Discharge 10/02/2021 1348 by Santa Lighter, RN Outcome: Progressing   Problem: Health Behavior/Discharge Planning: Goal: Ability to manage health-related needs will improve 10/02/2021 1348 by Santa Lighter, RN Outcome: Adequate for Discharge 10/02/2021 1348 by Santa Lighter, RN Outcome: Progressing   Problem: Clinical Measurements: Goal: Ability to maintain clinical measurements within normal limits will improve Outcome: Adequate for Discharge Goal: Will remain free from infection Outcome: Adequate for Discharge Goal: Diagnostic test results will improve Outcome: Adequate for Discharge Goal: Respiratory complications will improve Outcome: Adequate for Discharge Goal: Cardiovascular complication will be avoided Outcome: Adequate for Discharge   Problem: Activity: Goal: Risk for activity intolerance will decrease Outcome: Adequate for Discharge   Problem: Nutrition: Goal: Adequate nutrition will be maintained Outcome: Adequate for Discharge   Problem: Coping: Goal: Level of anxiety will decrease Outcome: Adequate for Discharge   Problem: Elimination: Goal: Will not experience complications related to bowel motility Outcome: Adequate for Discharge Goal: Will not experience complications related to urinary retention Outcome: Adequate for Discharge   Problem: Pain Managment: Goal: General experience of comfort will improve Outcome: Adequate for Discharge   Problem: Safety: Goal: Ability to remain free from injury will improve Outcome: Adequate for Discharge   Problem: Skin Integrity: Goal: Risk for impaired skin integrity will decrease Outcome: Adequate for Discharge

## 2021-10-06 LAB — CULTURE, BLOOD (ROUTINE X 2)
Culture: NO GROWTH
Culture: NO GROWTH
Special Requests: ADEQUATE
Special Requests: ADEQUATE

## 2021-10-12 ENCOUNTER — Encounter: Payer: Self-pay | Admitting: Family Medicine

## 2021-10-12 ENCOUNTER — Ambulatory Visit (INDEPENDENT_AMBULATORY_CARE_PROVIDER_SITE_OTHER): Payer: Medicare HMO | Admitting: Family Medicine

## 2021-10-12 VITALS — BP 137/79 | HR 84 | Temp 97.5°F | Ht 69.0 in | Wt 133.8 lb

## 2021-10-12 DIAGNOSIS — F419 Anxiety disorder, unspecified: Secondary | ICD-10-CM | POA: Diagnosis not present

## 2021-10-12 DIAGNOSIS — F32A Depression, unspecified: Secondary | ICD-10-CM | POA: Diagnosis not present

## 2021-10-12 DIAGNOSIS — I159 Secondary hypertension, unspecified: Secondary | ICD-10-CM

## 2021-10-12 DIAGNOSIS — D692 Other nonthrombocytopenic purpura: Secondary | ICD-10-CM

## 2021-10-12 MED ORDER — DOXYCYCLINE HYCLATE 100 MG PO TABS: 100.0000 mg | ORAL_TABLET | Freq: Two times a day (BID) | ORAL | 0 refills | Status: DC

## 2021-10-12 MED ORDER — BLOOD PRESSURE KIT: PACK | 0 refills | Status: DC

## 2021-10-12 NOTE — Assessment & Plan Note (Signed)
Stable on Prozac 80 mg daily.

## 2021-10-12 NOTE — Assessment & Plan Note (Signed)
Stable on Prozac 80 mg daily. 

## 2021-10-12 NOTE — Assessment & Plan Note (Signed)
Significantly elevated today though he has been on midodrine for the last 2 weeks as was started at the hospital for soft blood pressures in setting of concern for sepsis.  We will stop this today.  We will likely need to restart his losartan-HCTZ at some point in the future though we will defer this for today.  He will come back in 1 to 2 weeks for blood pressure recheck.  We will send a prescription for home blood pressure kit as well.  They will continue to monitor at home and let us know if persistently elevated.

## 2021-10-12 NOTE — Assessment & Plan Note (Signed)
Secondary to aspirin use.  Reassured patient.

## 2021-10-12 NOTE — Patient Instructions (Signed)
It was very nice to see you today!  Please take the doxycycline.  .  Midodrine.  Come back in a couple weeks for blood pressure recheck.  Take care, Dr Jerline Pain  PLEASE NOTE:  If you had any lab tests please let us know if you have not heard back within a few days. You may see your results on mychart before we have a chance to review them but we will give you a call once they are reviewed by [place]. If we ordered any referrals today, please let us know if you have not heard from their office within the next week.   Please try these tips to maintain a healthy lifestyle:  Eat at least 3 REAL meals and 1-2 snacks per day.  Aim for no more than 5 hours between eating.  If you eat breakfast, please do so within one hour of getting up.   Each meal should contain half fruits/vegetables, one quarter protein, and one quarter carbs (no bigger than a computer mouse)  Cut down on sweet beverages. This includes juice, soda, and sweet tea.   Drink at least 1 glass of water with each meal and aim for at least 8 glasses per day  Exercise at least 150 minutes every week.

## 2021-10-12 NOTE — Progress Notes (Signed)
Michael [name] is a 61 y.o. male who presents today for an office visit.  Assessment/Plan:  New/Acute Problems: Cellulitis  No signs of systemic illness.  Symptoms have mostly resolved though still has a few areas of inflammation.  We will give a few more days of doxycycline.  He tolerated this well without any significant side effects.  We discussed reasons to return to care.  Follow-up as needed.  Chronic Problems Addressed Today: HTN (hypertension) Significantly elevated today though he has been on midodrine for the last 2 weeks as was started at the hospital for soft blood pressures in setting of concern for sepsis.  We will stop this today.  We will likely need to restart his losartan-HCTZ at some point in the future though we will defer this for today.  He will come back in 1 to 2 weeks for blood pressure recheck.  We will send a prescription for home blood pressure kit as well.  They will continue to monitor at home and let us know if persistently elevated.  Senile purpura ([name]) Secondary to aspirin use.  Reassured patient.  Depression Stable on Prozac 80 mg daily.  Anxiety Stable on Prozac 80 mg daily.     Subjective:  HPI:  Patient here today for hospitalization follow-up. Was admitted to the ED on 10/01/2021 with left arm infection.  He was admitted for concern for sepsis.  Started on vancomycin and azithromycin.  His leukocytosis improved and he was discharged on Keflex and doxycycline.  He was found to have low blood pressure in the ED and his losartan and HCTZ were discontinued.  He has done well since being home.  Finishes antibiotics.  Still has some redness and pain to his left arm.  No fevers or chills.  ROS: er HPI, otherwise a complete review of systems was negative.   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   For bradycardia   Seizure disorder ([place])    Seizures ([place])    as a baby   Shortness of breath    Tobacco abuse    Patient Active Problem List   Diagnosis Date Noted   Senile purpura ([place]) 10/12/2021   Osteoarthritis 04/13/2020   Chronic pain syndrome 04/13/2020   Vitamin D deficiency 11/11/2019   History of cardiac pacemaker in situ 11/11/2019   Asthma 11/11/2019   Seizure disorder ([place]) 03/17/2017   Hypothyroidism 03/17/2017   GERD (gastroesophageal reflux disease) 03/17/2017   Anxiety 03/17/2017   Depression 03/17/2017   HTN (hypertension) 03/17/2017   Cigarette smoker 04/13/2016   PTSD (post-traumatic stress disorder) 01/27/2016   Chronic respiratory failure with hypercapnia ([place]) 12/09/2015   Paroxysmal atrial fibrillation ([place]) 10/22/2013   Sinus node dysfunction ([place]) 10/22/2013   COPD GOLD III  07/08/2013   Symptomatic bradycardia 09/06/2012  Pacemaker - Medtronic 2005, generator change September 2014 09/06/2012   Past Surgical History:  Procedure Laterality Date   CARDIAC CATHETERIZATION  2002, 2003   normal coronaries   EYE SURGERY  [age] y.o.   GALLBLADDER SURGERY  2003   HAND SURGERY  1998   x3   OPEN REDUCTION INTERNAL FIXATION (ORIF) DISTAL RADIAL FRACTURE Right 09/07/2016   Procedure: RIGHT WRIST OPEN REDUCTION INTERNAL FIXATION (ORIF) REPAIR AS INDICATED;  Surgeon: Iran Planas, MD;  Location: [facility];  Service: Orthopedics;  Laterality: Right;   PACEMAKER INSERTION  04/23/2003   Medtronic Kappa; [facility] - [place] -  symptomatic bradycardia   PERMANENT PACEMAKER GENERATOR CHANGE N/A 09/06/2012   Procedure: PERMANENT PACEMAKER GENERATOR CHANGE;  Surgeon: Sanda Klein, MD;  Location: [facility] CATH [facility];  Service: Cardiovascular;  Laterality: N/A;   TONSILLECTOMY     TRANSTHORACIC ECHOCARDIOGRAM  05/14/2012   EF 50-27%, normal systolic function; mild MR (ordered for mitral valve disease 424.0)    Family History  Problem Relation Age of Onset   Heart Problems Mother        MVP   Heart disease Mother    CAD Father    COPD Father    Heart disease Father    Other Sister        crib dealth   Heart attack Maternal Grandmother    Hypertension Maternal Grandmother    Heart attack Paternal Grandmother    Hypertension Paternal Grandmother    Heart attack Paternal Grandfather    Hypertension Paternal Grandfather    Heart Problems Other        aunts x 2 died of heart problems     Medications- reviewed and updated Current Outpatient Medications  Medication Sig Dispense Refill   albuterol (ACCUNEB) 1.25 MG/3ML nebulizer solution 2.5 mg every 4 hours as needed for cough, shortness of breath and/or wheezing 75 mL 3   aspirin EC 81 MG tablet Take 81 mg by mouth daily. Swallow whole.     Blood Pressure KIT Use daily as needed to check blood pressure 1 kit 0   doxycycline (VIBRA-TABS) 100 MG tablet Take 1 tablet (100 mg total) by mouth 2 (two) times daily. 14 tablet 0   EPINEPHrine 0.3 mg/0.3 mL IJ SOAJ injection      FLUoxetine (PROZAC) 40 MG capsule Take 2 capsules (80 mg total) by mouth daily. 180 capsule 3   fluticasone (FLONASE) 50 MCG/ACT nasal spray Place 2 sprays into both nostrils as needed.     omeprazole (PRILOSEC) 20 MG capsule Take 1 capsule (20 mg total) by mouth daily. 90 capsule 3   SYMBICORT 160-4.5 MCG/ACT inhaler Take 2 puffs first thing in am and then another 2 puffs about 12 hours later. 10.2 g 5   Tiotropium Bromide Monohydrate (SPIRIVA RESPIMAT) 2.5 MCG/ACT AERS Inhale 2 puffs into the lungs  daily. 4 g 5   VENTOLIN HFA 108 (90 Base) MCG/ACT inhaler INHALE UP TO  2 PUFFS BY MOUTH EVERY 4 HOURS AS NEEDED 54 g 3   Vitamin D, Ergocalciferol, (DRISDOL) 1.25 MG (50000 UNIT) CAPS capsule Take 1 capsule (50,000 Units total) by mouth every Sunday. 12 capsule 3   No current facility-administered medications for this visit.    Allergies-reviewed and updated Allergies  Allergen Reactions   Shellfish Allergy Anaphylaxis    Social History   Socioeconomic History   Marital status: Divorced    Spouse name: Not on file   Number of children: 2   Years of education:  12   Highest education level: Not on file  Occupational History   Occupation: Disabled  Tobacco Use   Smoking status: Every Day    Packs/day: 1.00    Years: 40.00    Total pack years: 40.00    Types: Cigarettes   Smokeless tobacco: Never  Vaping Use   Vaping Use: Never used  Substance and Sexual Activity   Alcohol use: No   Drug use: Never   Sexual activity: Not Currently  Other Topics Concern   Not on file  Social History Narrative   Not on file   Social Determinants of Health   Financial Resource Strain: Low Risk  (09/26/2020)   Overall Financial Resource Strain (CARDIA)    Difficulty of Paying Living Expenses: Not hard at all  Food Insecurity: No Food Insecurity (10/01/2021)   Hunger Vital Sign    Worried About Running Out of Food in the Last Year: Never true    Ran Out of Food in the Last Year: Never true  Transportation Needs: No Transportation Needs (10/01/2021)   PRAPARE - Hydrologist (Medical): No    Lack of Transportation (Non-Medical): No  Physical Activity: Inactive (09/26/2020)   Exercise Vital Sign    Days of Exercise per Week: 0 days    Minutes of Exercise per Session: 0 min  Stress: Stress Concern Present (09/26/2020)   [place]    Feeling of Stress : Rather much  Social Connections: Moderately  Isolated (09/26/2020)   Social Connection and Isolation Panel [NHANES]    Frequency of Communication with Friends and Family: More than three times a week    Frequency of Social Gatherings with Friends and Family: Twice a week    Attends Religious Services: Never    Marine scientist or Organizations: Yes    Attends Archivist Meetings: Never    Marital Status: Divorced           Objective:  Physical Exam: BP (!) 160/90   Pulse 84   Temp (!) 97.5 F (36.4 C) (Temporal)   Ht _0  (1.753 m)   Wt 133 lb 12.8 oz (60.7 kg)   SpO2 96%   BMI 19.76 kg/m   Gen: No acute distress, resting comfortably CV: Regular rate and rhythm with no murmurs appreciated Pulm: Normal work of breathing, clear to auscultation bilaterally with no crackles, wheezes, or rhonchi Skin: Few small areas of erythema and induration on right arm. Neuro: Grossly normal, moves all extremities Psych: Normal affect and thought content  Time Spent: 40 minutes of total time was spent on the date of the encounter performing the following actions: chart review prior to seeing the patient including recent hospitalization, obtaining history, performing a medically necessary exam, counseling on the treatment plan, placing orders, and documenting in our EHR.        Algis Greenhouse. Jerline Pain, MD 10/12/2021 12:11 PM

## 2021-10-13 ENCOUNTER — Telehealth: Payer: Self-pay | Admitting: Family Medicine

## 2021-10-13 ENCOUNTER — Ambulatory Visit: Payer: Medicare HMO | Admitting: Internal Medicine

## 2021-10-13 ENCOUNTER — Encounter: Payer: Self-pay | Admitting: Internal Medicine

## 2021-10-13 VITALS — BP 144/82 | HR 86 | Temp 97.7°F | Ht 69.0 in | Wt 133.8 lb

## 2021-10-13 DIAGNOSIS — F17210 Nicotine dependence, cigarettes, uncomplicated: Secondary | ICD-10-CM

## 2021-10-13 DIAGNOSIS — J449 Chronic obstructive pulmonary disease, unspecified: Secondary | ICD-10-CM | POA: Diagnosis not present

## 2021-10-13 DIAGNOSIS — Z23 Encounter for immunization: Secondary | ICD-10-CM | POA: Diagnosis not present

## 2021-10-13 MED ORDER — SYMBICORT 160-4.5 MCG/ACT IN AERO: INHALATION_SPRAY | RESPIRATORY_TRACT | 3 refills | Status: DC

## 2021-10-13 MED ORDER — SPIRIVA RESPIMAT 2.5 MCG/ACT IN AERS: 2.0000 | INHALATION_SPRAY | Freq: Every day | RESPIRATORY_TRACT | 3 refills | Status: DC

## 2021-10-13 NOTE — Progress Notes (Addendum)
Subjective:     Patient ID: Michael [name], male   DOB: 05-18-1960      MRN: [id number]    Brief patient profile:  89 yowm MM/active smoker referred to pulmonary clinic 12/08/2015 by Dr  Graylon Good with GOLD III copd documented 08/19/13     History of Present Illness  10/11/2017  f/u ov/[name] re: COPD III/ still smoking maint on symb/ spiriva dpi  Chief Complaint  Patient presents with   Follow-up    Breathing "depends on the heat outside"- he is unsure of meds and states "needs refills on everything".    Dyspnea:  MMRC3 = can't walk 100 yards even at a slow pace at a flat grade s stopping due to sob   Cough: provoked  with active smoking and ? From dpi spiriva also/ min mucoid  Sleeping: bed flat with a couple of pillows  SABA use: way too much when weather hot  02: none  rec Change spiriva to Respimat form  and use 2 pffs first thing in am only to follow the symbicort x 2 puffs only in the am, then take the other 2 pffs of symbicort 12 hours later  Work on inhaler technique:  The key is to stop smoking completely before smoking completely stops you!  Please schedule a follow up visit in 3 months but call sooner if needed  with all medications       04/15/2021  f/u ov/[name] re: GOLD 3 copd  maint on Symbicort/ spiriva   Chief Complaint  Patient presents with   Follow-up    Follow up.    Dyspnea:  still able to get around a grocery store ok until broke L foot when "Harley fell on it" Cough: esp in am, congested clear mucus clears in a few mnitues  Sleeping: needs saba occ  SABA use: varies depending on activity / does not rechallenge  02: none  Covid status:   vax x 4  Rec The key is to stop smoking completely before smoking completely stops you! Work on Doctor, hospital - remember how golfers take practice swings.  I will also be referring you for lung cancer screening to get started this year> not done as of 10/13/2021  Please schedule a follow up visit in 6 months but call sooner  if needed - bring all inhalers     10/13/2021  f/u ov/[name] office/Delynn Pursley re: GOLD 3  maint on symb/spiriva  needs LDSCT  Chief Complaint  Patient presents with   Follow-up    Feels breathing may be the same since last ov. Had a recent hospital admission 9/29-9/30     Dyspnea:  still doing grocery shopping until admitted  Cough: mucus clear in am / takes several hours to clear it. Sleeping: occ needs saba noct15 y  SABA use: not needing neb  02: none    Lung cancer screening: referred again today    No obvious day to day or daytime variability or assoc excess/ purulent sputum or mucus plugs or hemoptysis or cp or chest tightness, subjective wheeze or overt sinus or hb symptoms.     Also denies any obvious fluctuation of symptoms with weather or environmental changes or other aggravating or alleviating factors except as outlined above   No unusual exposure hx or h/o childhood pna/ asthma or knowledge of premature birth.  Current Allergies, Complete Past Medical History, Past Surgical History, Family History, and Social History were reviewed in [employer] record.  ROS  The following are not active complaints unless bolded Hoarseness, sore throat, dysphagia, dental problems, itching, sneezing,  nasal congestion or discharge of excess mucus or purulent secretions, ear ache,   fever, chills, sweats, unintended wt loss or wt gain, classically pleuritic or exertional cp,  orthopnea pnd or arm/hand swelling  or leg swelling, presyncope, palpitations, abdominal pain, anorexia, nausea, vomiting, diarrhea  or change in bowel habits or change in bladder habits, change in stools or change in urine, dysuria, hematuria,  rash, arthralgias, visual complaints, headache, numbness, weakness or ataxia or problems with walking or coordination,  change in mood or  memory.        Current Meds  Medication Sig   albuterol (ACCUNEB) 1.25 MG/3ML nebulizer solution 2.5 mg every 4 hours  as needed for cough, shortness of breath and/or wheezing   aspirin EC 81 MG tablet Take 81 mg by mouth daily. Swallow whole.   Blood Pressure KIT Use daily as needed to check blood pressure   cephALEXin (KEFLEX) 500 MG capsule Take 500 mg by mouth 2 (two) times daily.   doxycycline (VIBRA-TABS) 100 MG tablet Take 1 tablet (100 mg total) by mouth 2 (two) times daily.   EPINEPHrine 0.3 mg/0.3 mL IJ SOAJ injection    FLUoxetine (PROZAC) 40 MG capsule Take 2 capsules (80 mg total) by mouth daily.   fluticasone (FLONASE) 50 MCG/ACT nasal spray Place 2 sprays into both nostrils as needed.   omeprazole (PRILOSEC) 20 MG capsule Take 1 capsule (20 mg total) by mouth daily.   SYMBICORT 160-4.5 MCG/ACT inhaler Take 2 puffs first thing in am and then another 2 puffs about 12 hours later.   Tiotropium Bromide Monohydrate (SPIRIVA RESPIMAT) 2.5 MCG/ACT AERS Inhale 2 puffs into the lungs daily.   VENTOLIN HFA 108 (90 Base) MCG/ACT inhaler INHALE UP TO  2 PUFFS BY MOUTH EVERY 4 HOURS AS NEEDED   Vitamin D, Ergocalciferol, (DRISDOL) 1.25 MG (50000 UNIT) CAPS capsule Take 1 capsule (50,000 Units total) by mouth every 'Sunday.                  Objective:   Physical Exam  10/13/2021       133  04/15/2021         136  10/06/2020         135  10/04/2019         126  02/19/2019         137  01/12/2018       137  10/11/2017       131  07/11/2017         125  01/11/2017         139  07/11/2016         144   04/11/2016        147          12' /05/17 157 lb 3.2 oz (71.3 kg)  11/04/15 153 lb 12.8 oz (69.8 kg)  08/25/15 160 lb 12.8 oz (72.9 kg)    Vital signs reviewed  10/13/2021  - Note at rest 02 sats  94% on RA   General appearance:    amb disheveled  wm nad    HEENT :  Oropharynx  clear/ edentulous       NECK :  without JVD/Nodes/TM/ nl carotid upstrokes bilaterally   LUNGS: no acc muscle use,  Mod barrel  contour chest wall with bilateral  Distant bs s audible wheeze and  without cough on insp or exp  maneuvers  and mod  Hyperresonant  to  percussion bilaterally     CV:  RRR  no s3 or murmur or increase in P2, and no edema   ABD:  soft and nontender with pos mid insp Hoover's  in the supine position. No bruits or organomegaly appreciated, bowel sounds nl  MS:   Ext warm without deformities or   obvious joint restrictions , calf tenderness, cyanosis or clubbing  SKIN: warm and dry without lesions    NEURO:  alert, approp, nl sensorium with  no motor or cerebellar deficits apparent.               Assessment:

## 2021-10-13 NOTE — Telephone Encounter (Signed)
Patient Name: Michael [name] Gender: Unknown DOB: April 01, 1960 Age: 61 Y 9 M 9 D Return Phone Number: [phone] (Primary) Address: City/ State/ Zip: [place]   [address] Client [facility] Client Site [facility] [place] Night Provider Dimas Chyle- MD Contact Type Call Who Is Calling Patient / Member / Family / Caregiver Call Type Triage / Clinical Caller Name [name] Relationship To Patient Mother Return Phone Number Please choose phone number Chief Complaint Prescription Refill or Medication Request (non symptomatic) Reason for Call Medication Question / Request Initial Comment Caller stated that their RX has still not been sent to the pharmacy, Translation No Nurse Assessment Nurse: Amalia Hailey, RN, Joelene Millin Date/Time (Eastern Time): 10/12/2021 5:13:28 PM Confirm and document reason for call. If symptomatic, describe symptoms. ---Caller states that he saw the doctor today. Prescriptions were supposed to be called in. One missing Keflex. No new symptoms Does the patient have any new or worsening symptoms? ---No Guidelines Guideline Title Affirmed Question Affirmed Notes Nurse Date/Time Eilene Ghazi Time) Recent Medical Visit for Illness Follow-up Call [1] Caller has URGENT question (includes prescribed medication questions) AND [2] triager unable to answer question Amalia Hailey, RN, Joelene Millin 10/12/2021 5:14:23 PM Disp. Time Eilene Ghazi Time) Disposition Final User 10/12/2021 5:17:47 PM Call PCP Now Yes Amalia Hailey, RN, Joelene Millin 10/12/2021 5:20:57 PM Called On-Call Provider Amalia Hailey, RN, Joelene Millin Final Disposition 10/12/2021 5:17:47 PM Call PCP Now Lanelle Bal, RN, Renea Ee Disagree/Comply Comply Caller Understands Yes PreDisposition Call Doctor Care Advice Given Per Guideline CALL PCP NOW: * I'll page the on-call provider now. If you haven't heard from the provider (or me) within 30 minutes, call again. CARE ADVICE  given per Recent Medical Visit for Illness: Follow-Up Call (Adult) guideline. Comments User: Darrelyn Hillock, RN Date/Time Eilene Ghazi Time): 10/12/2021 5:22:11 PM Contacted on call due to recent discharge from the hospital and there being a lapse in treatment by waiting until tomorrow as caller is out of antibiotics. User: Darrelyn Hillock, RN Date/Time Eilene Ghazi Time): 10/12/2021 5:23:46 PM Advised caller that prescription would be sent to the pharmacy. Caller verbalized understanding [facility] Phone DateTime Result/ Outcome Message Type Notes Queen Slough [phone] 10/12/2021 5:20:57 PM Called On Call Provider - Reached Doctor Paged Queen Slough 10/12/2021 5:21:10 PM Spoke with On Call - General Message Result On call to send in the prescription

## 2021-10-13 NOTE — Patient Instructions (Addendum)
My office will be contacting you by phone for referral to lung cancer screening  - if you don't hear back from my office within one week please call us back or notify us thru MyChart and we'll address it right away.   No change in inhalers or nebulizer  recommendations  RSV shot today   Please schedule a follow up visit in 6  months but call sooner if needed

## 2021-10-15 ENCOUNTER — Encounter: Payer: Self-pay | Admitting: Internal Medicine

## 2021-10-15 NOTE — Assessment & Plan Note (Addendum)
Counseled re importance of smoking cessation but did not meet time criteria for separate billing    Low-dose CT lung cancer screening is recommended for patients who are 27-60 years of age with a 20+ pack-year history of smoking and who are currently smoking or quit <=15 years ago. No coughing up blood  No unintentional weight loss of > 15 pounds in the last 6 months - pt is eligible for scanning yearly until 15 y p quits > referred again          Each maintenance medication was reviewed in detail including emphasizing most importantly the difference between maintenance and prns and under what circumstances the prns are to be triggered using an action plan format where appropriate.  Total time for H and P, chart review, counseling, reviewing hfa device(s) and generating customized AVS unique to this office visit / same day charting = 25 min

## 2021-10-15 NOTE — Assessment & Plan Note (Signed)
Active smoker - reports quit smokng 06/2013> resumed by ov 04/11/2016   - Spirometry 08/19/2013  FEV1 1.85 ( 47%) ratio 44  - Spirometry 12/08/2015   FEV1 1.53 (43%)  Ratio 40 off maint rx  - 12/08/2015  After extensive coaching HFA effectiveness =    75% > start symb 160/atrovent qid   - Allergy profile 12/08/15  >  Eos 0.0 /  IgE  347  RAST pos dust/ mold - [facility] AT screen 12/08/15 > MM/ levels ok   - 10/11/2017    changed to spiriva smi   - 04/15/2021  After extensive coaching inhaler device,  effectiveness =    80%  With hfa    Group D (now reclassified as E) in terms of symptom/risk and laba/lama/ICS  therefore appropriate rx at this point >>>  Symb/spiriva and approp saba

## 2021-10-21 ENCOUNTER — Ambulatory Visit (INDEPENDENT_AMBULATORY_CARE_PROVIDER_SITE_OTHER): Payer: Medicare HMO

## 2021-10-21 DIAGNOSIS — Z0000 Encounter for general adult medical examination without abnormal findings: Secondary | ICD-10-CM | POA: Diagnosis not present

## 2021-10-21 DIAGNOSIS — Z122 Encounter for screening for malignant neoplasm of respiratory organs: Secondary | ICD-10-CM

## 2021-10-21 NOTE — Progress Notes (Signed)
Subjective:   Michael [name] is a 61 y.o. male who presents for Medicare Annual/Subsequent preventive examination.  Review of Systems     Cardiac Risk Factors include: advanced age (>58mn, >>69women);smoking/ tobacco exposure;hypertension;male gender     Objective:    There were no vitals filed for this visit. There is no height or weight on file to calculate BMI.     10/21/2021    3:45 PM 10/01/2021    1:15 PM 09/26/2020   10:09 AM 03/17/2017    7:00 PM 03/16/2017   10:22 PM 09/07/2016   10:19 PM 09/07/2016    2:21 PM  Advanced Directives  Does Patient Have a Medical Advance Directive? No No Yes Yes No Yes Yes  Type of AInvestment banker, corporatePower of ANew FranklinLiving will  Does patient want to make changes to medical advance directive?   No - Patient declined   No - Patient declined No - Patient declined  Copy of HWardin Chart?   No - copy requested No - copy requested  No - copy requested No - copy requested  Would patient like information on creating a medical advance directive? No - Patient declined No - Patient declined         Current Medications (verified) Outpatient Encounter Medications as of 10/21/2021  Medication Sig   albuterol (ACCUNEB) 1.25 MG/3ML nebulizer solution 2.5 mg every 4 hours as needed for cough, shortness of breath and/or wheezing   aspirin EC 81 MG tablet Take 81 mg by mouth daily. Swallow whole.   Blood Pressure KIT Use daily as needed to check blood pressure   FLUoxetine (PROZAC) 40 MG capsule Take 2 capsules (80 mg total) by mouth daily.   fluticasone (FLONASE) 50 MCG/ACT nasal spray Place 2 sprays into both nostrils as needed.   omeprazole (PRILOSEC) 20 MG capsule Take 1 capsule (20 mg total) by mouth daily.   SYMBICORT 160-4.5 MCG/ACT inhaler Take 2 puffs first thing in am and then another 2 puffs about 12 hours later.   Tiotropium  Bromide Monohydrate (SPIRIVA RESPIMAT) 2.5 MCG/ACT AERS Inhale 2 puffs into the lungs daily.   VENTOLIN HFA 108 (90 Base) MCG/ACT inhaler INHALE UP TO  2 PUFFS BY MOUTH EVERY 4 HOURS AS NEEDED   Vitamin D, Ergocalciferol, (DRISDOL) 1.25 MG (50000 UNIT) CAPS capsule Take 1 capsule (50,000 Units total) by mouth every Sunday.   EPINEPHrine 0.3 mg/0.3 mL IJ SOAJ injection  (Patient not taking: Reported on 10/21/2021)   [DISCONTINUED] cephALEXin (KEFLEX) 500 MG capsule Take 500 mg by mouth 2 (two) times daily.   [DISCONTINUED] doxycycline (VIBRA-TABS) 100 MG tablet Take 1 tablet (100 mg total) by mouth 2 (two) times daily.   No facility-administered encounter medications on file as of 10/21/2021.    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   GALLBLADDER SURGERY  2003   HAND SURGERY  1998   x3   OPEN REDUCTION INTERNAL FIXATION (ORIF) DISTAL RADIAL FRACTURE Right 09/07/2016   Procedure: RIGHT WRIST OPEN REDUCTION INTERNAL FIXATION (ORIF) REPAIR AS INDICATED;  Surgeon: Iran Planas, MD;  Location: [facility];  Service: Orthopedics;  Laterality: Right;   PACEMAKER INSERTION  04/23/2003    [employer] Kappa; [facility] - symptomatic bradycardia   PERMANENT PACEMAKER GENERATOR CHANGE N/A 09/06/2012   Procedure: PERMANENT PACEMAKER GENERATOR CHANGE;  Surgeon: Sanda Klein, MD;  Location: [facility] CATH LAB;  Service: Cardiovascular;  Laterality: N/A;   TONSILLECTOMY     TRANSTHORACIC ECHOCARDIOGRAM  05/14/2012   EF 99-37%, normal systolic function; mild MR (ordered for mitral valve disease 424.0)   Family History  Problem Relation Age of Onset   Heart Problems Mother        MVP   Heart disease Mother    CAD Father    COPD Father    Heart disease Father    Other Sister        crib dealth   Heart attack Maternal Grandmother    Hypertension Maternal Grandmother    Heart attack Paternal Grandmother    Hypertension Paternal Grandmother    Heart attack Paternal Grandfather    Hypertension Paternal Grandfather    Heart Problems Other        aunts x 2 died of heart problems    Social History   Socioeconomic History   Marital status: Divorced    Spouse name: Not on file   Number of children: 2   Years of education: 12   Highest education level: Not on file  Occupational History   Occupation: Disabled  Tobacco Use   Smoking status: Every Day    Packs/day: 1.00    Years: 40.00    Total pack years: 40.00    Types: Cigarettes   Smokeless tobacco: Never  Vaping Use   Vaping Use: Never used  Substance and Sexual Activity   Alcohol use: No   Drug use: Never   Sexual activity: Not Currently  Other Topics Concern   Not on file  Social History Narrative   Not on file   Social Determinants of Health   Financial Resource Strain: Low Risk  (10/21/2021)   Overall Financial Resource Strain (CARDIA)    Difficulty of Paying Living Expenses: Not hard at all  Food Insecurity: No Food Insecurity (10/21/2021)   Hunger Vital Sign    Worried About Running Out of Food in the Last Year: Never true    Ran Out of Food in the Last Year: Never true  Transportation  Needs: No Transportation Needs (10/21/2021)   PRAPARE - Hydrologist (Medical): No    Lack of Transportation (Non-Medical): No  Physical Activity: Inactive (10/21/2021)   Exercise Vital Sign    Days of Exercise per Week: 0 days    Minutes of Exercise per Session: 0 min  Stress: No Stress Concern Present (10/21/2021)   [place] -  Occupational Stress Questionnaire    Feeling of Stress : Only a little  Social Connections: Moderately Isolated (10/21/2021)   Social Connection and Isolation Panel [NHANES]    Frequency of Communication with Friends and Family: More than three times a week    Frequency of Social Gatherings with Friends and Family: Twice a week    Attends Religious Services: Never    Marine scientist or Organizations: Yes    Attends Archivist Meetings: Never    Marital Status: Divorced    Tobacco Counseling Ready to quit: Not Answered Counseling given: Not Answered   Clinical Intake:  Pre-visit preparation completed: Yes  Pain : No/denies pain     Nutritional Risks: None Diabetes: No  How often do you need to have someone help you when you read instructions, pamphlets, or other written materials from your doctor or pharmacy?: 1 - Never  Diabetic?no  Interpreter Needed?: No  Information entered by :: Charlott Rakes, LPN   Activities of Daily Living    10/21/2021    3:48 PM 10/01/2021    6:00 PM  In your present state of health, do you have any difficulty performing the following activities:  Hearing? 0 0  Vision? 0 0  Difficulty concentrating or making decisions? 0 0  Walking or climbing stairs? 1 0  Comment SOB at times   Dressing or bathing? 0 0  Doing errands, shopping? 0 0  Preparing Food and eating ? N   Using the Toilet? N   In the past six months, have you accidently leaked urine? N   Do you have problems with loss of bowel control? N   Managing your Medications? N    Managing your Finances? N   Housekeeping or managing your Housekeeping? N     Patient Care Team: Vivi Barrack, MD as PCP - General (Family Medicine) Sanda Klein, MD as Consulting Physician (Cardiology) Tanda Rockers, MD as Consulting Physician (Pulmonary Disease)  Indicate any recent Medical Services you may have received from other than [facility] providers in the past year (date may be approximate).     Assessment:   This is a routine wellness examination for Mason.  Hearing/Vision screen Hearing Screening - Comments:: Pt denies any hearing issues  Vision Screening - Comments:: [facility]   Dietary issues and exercise activities discussed: Current Exercise Habits: The patient does not participate in regular exercise at present   Goals Addressed             This Visit's Progress    Patient Stated       Better health get out and do more        Depression Screen    10/21/2021    3:42 PM 10/12/2021   11:33 AM 09/26/2020   10:10 AM 09/26/2020   10:01 AM 01/05/2016   11:11 AM 07/28/2015   11:23 AM 01/14/2014   11:45 AM  PHQ 2/9 Scores  PHQ - 2 Score 0 0 0 0 6 0 0  PHQ- 9 Score   15  24      Fall Risk    10/21/2021    3:47 PM 10/12/2021   11:34 AM 09/26/2020   10:10 AM 11/11/2019    1:51 PM 01/05/2016   11:11 AM  [place] in the past year? 0 0 0 0 No  Number falls in past yr: 1 0 0 0   Injury  with Fall? 0 0 1 0   Risk for fall [name] to : No Fall Risks;Impaired vision;Impaired balance/gait  No Fall Risks    Follow up Falls prevention discussed   Falls evaluation completed     FALL RISK PREVENTION PERTAINING TO THE HOME:  Any stairs in or around the home? Yes  If so, are there any without handrails? No  Home free of loose throw rugs in walkways, pet beds, electrical cords, etc? Yes  Adequate lighting in your home to reduce risk of falls? Yes   ASSISTIVE DEVICES UTILIZED TO PREVENT FALLS:  Life alert? No  Use  of a cane, walker or w/c? No  Grab bars in the bathroom? No  Shower chair or bench in shower? No  Elevated toilet seat or a handicapped toilet? No   TIMED UP AND GO:  Was the test performed? No .   Cognitive Function:        10/21/2021    3:49 PM  6CIT Screen  What Year? 0 points  What month? 0 points  What time? 0 points  Count back from 20 0 points  Months in reverse 0 points  Repeat phrase 0 points  Total Score 0 points    Immunizations Immunization History  Administered Date(s) Administered   Influenza Whole 09/13/2021   Influenza, High Dose Seasonal PF 10/11/2017   Influenza, Quadrivalent, Recombinant, Inj, Pf 11/02/2018   Influenza,inj,Quad PF,6+ Mos 09/02/2013, 08/25/2015, 10/29/2016, 10/06/2020   Influenza,inj,quad, With Preservative 11/02/2018   Influenza-Unspecified 09/23/2019   PFIZER Comirnaty(Gray Top)Covid-19 Tri-Sucrose Vaccine 04/28/2020   PFIZER(Purple Top)SARS-COV-2 Vaccination 03/21/2019, 04/15/2019, 11/02/2019   Pfizer Covid-19 Vaccine Bivalent Booster 98yr & up 10/21/2020   Pneumococcal Polysaccharide-23 06/15/2013   Respiratory Syncytial Virus Vaccine,Recomb Aduvanted(Arexvy) 10/13/2021   Tdap 07/09/2013, 12/10/2019, 10/01/2021   Zoster Recombinat (Shingrix) 09/26/2019, 12/10/2019    TDAP status: Up to date  Flu Vaccine status: Up to date    Covid-19 vaccine status: Completed vaccines  Qualifies for Shingles Vaccine? Yes   Zostavax completed Yes   Shingrix Completed?: Yes  Screening Tests Health Maintenance  Topic Date [name]   COVID-19 Vaccine (6 - Pfizer risk series) 10/28/2021 (Originally 12/16/2020)   COLONOSCOPY (Pts 45-467yrInsurance coverage will need to be confirmed)  07/10/2023   TETANUS/TDAP  10/02/2031   INFLUENZA VACCINE  Completed   Hepatitis C Screening  Completed   HIV Screening  Completed   Zoster Vaccines- Shingrix  Completed   HPV VACCINES  Aged Out    Health Maintenance  There are no preventive care  reminders to display for this patient.  Colorectal cancer screening: Type of screening: Colonoscopy. Completed 07/09/13. Repeat every 10 years  Lung Cancer Screening: (Low Dose CT Chest recommended if Age 60-80ears, 30 pack-year currently smoking OR have quit w/in 15years.) does qualify.   Lung Cancer Screening Referral: placed 10/21/21  Additional Screening:  Hepatitis C Screening: Completed 03/20/14  Vision Screening: Recommended annual ophthalmology exams for early detection of glaucoma and other disorders of the eye. Is the patient up to date with their annual eye exam?  Yes  Who is the provider or what is the name of the office in which the patient attends annual eye exams? [facility]  If pt is not established with a provider, would they like to be referred to a provider to establish care? No .   Dental Screening: Recommended annual dental exams for proper oral hygiene  Community Resource Referral / Chronic Care Management: CRR required this  visit?  No   CCM required this visit?  No      Plan:     I have personally reviewed and noted the following in the patient's chart:   Medical and social history Use of alcohol, tobacco or illicit drugs  Current medications and supplements including opioid prescriptions. Patient is not currently taking opioid prescriptions. Functional ability and status Nutritional status Physical activity Advanced directives List of other physicians Hospitalizations, surgeries, and ER visits in previous 12 months Vitals Screenings to include cognitive, depression, and falls Referrals and appointments  In addition, I have reviewed and discussed with patient certain preventive protocols, quality metrics, and best practice recommendations. A written personalized care plan for preventive services as well as general preventive health recommendations were provided to patient.     Willette Brace, LPN   [id number]   Nurse Notes: Pt  request refill called in for epinephrine 0.3 to [facility] on [address] related to the one he has is out of date please advise

## 2021-10-21 NOTE — Patient Instructions (Signed)
Michael [name] , Thank you for taking time to come for your Medicare Wellness Visit. I appreciate your ongoing commitment to your health goals. Please review the following plan we discussed and let me know if I can assist you in the future.   These are the goals we discussed:  Goals      Patient Stated     Better health get out and do more         This is a list of the screening recommended for you and due dates:  Health Maintenance  Topic Date Due   COVID-19 Vaccine (6 - Pfizer risk series) 10/28/2021*   Colon Cancer Screening  07/10/2023   Tetanus Vaccine  10/02/2031   Flu Shot  Completed   Hepatitis C Screening: USPSTF Recommendation to screen - Ages 18-79 yo.  Completed   HIV Screening  Completed   Zoster (Shingles) Vaccine  Completed   HPV Vaccine  Aged Out  *Topic was postponed. The date shown is not the original due date.    Advanced directives: Advance directive discussed with you today. Even though you declined this today please call our office should you change your mind and we can give you the proper paperwork for you to fill out.   Conditions/risks identified: to have better health and get out and do more   Next appointment: Follow up in one year for your annual wellness visit.   Preventive Care 62 Years and Older, Male  Preventive care refers to lifestyle choices and visits with your health care provider that can promote health and wellness. What does preventive care include? A yearly physical exam. This is also called an annual well check. Dental exams once or twice a year. Routine eye exams. Ask your health care provider how often you should have your eyes checked. Personal lifestyle choices, including: Daily care of your teeth and gums. Regular physical activity. Eating a healthy diet. Avoiding tobacco and drug use. Limiting alcohol use. Practicing safe sex. Taking low doses of aspirin every day. Taking vitamin and mineral supplements as recommended by your  health care provider. What happens during an annual well check? The services and screenings done by your health care provider during your annual well check will depend on your age, overall health, lifestyle risk factors, and family history of disease. Counseling  Your health care provider may ask you questions about your: Alcohol use. Tobacco use. Drug use. Emotional well-being. Home and relationship well-being. Sexual activity. Eating habits. History of falls. Memory and ability to understand (cognition). Work and work Statistician. Screening  You may have the following tests or measurements: Height, weight, and BMI. Blood pressure. Lipid and cholesterol levels. These may be checked every 5 years, or more frequently if you are over 83 years old. Skin check. Lung cancer screening. You may have this screening every year starting at age 66 if you have a 30-pack-year history of smoking and currently smoke or have quit within the past 15 years. Fecal occult blood test (FOBT) of the stool. You may have this test every year starting at age 75. Flexible sigmoidoscopy or colonoscopy. You may have a sigmoidoscopy every 5 years or a colonoscopy every 10 years starting at age 59. Prostate cancer screening. Recommendations will vary depending on your family history and other risks. Hepatitis C blood test. Hepatitis B blood test. Sexually transmitted disease (STD) testing. Diabetes screening. This is done by checking your blood sugar (glucose) after you have not eaten for a while (fasting). You  may have this done every 1-3 years. Abdominal aortic aneurysm (AAA) screening. You may need this if you are a current or former smoker. Osteoporosis. You may be screened starting at age 66 if you are at high risk. Talk with your health care provider about your test results, treatment options, and if necessary, the need for more tests. Vaccines  Your health care provider may recommend certain vaccines, such  as: Influenza vaccine. This is recommended every year. Tetanus, diphtheria, and acellular pertussis (Tdap, Td) vaccine. You may need a Td booster every 10 years. Zoster vaccine. You may need this after age 43. Pneumococcal 13-valent conjugate (PCV13) vaccine. One dose is recommended after age 61. Pneumococcal polysaccharide (PPSV23) vaccine. One dose is recommended after age 72. Talk to your health care provider about which screenings and vaccines you need and how often you need them. This information is not intended to replace advice given to you by your health care provider. Make sure you discuss any questions you have with your health care provider. Document Released: 01/16/2015 Document Revised: 09/09/2015 Document Reviewed: 10/21/2014 Elsevier Interactive Patient Education  2017 [place] Prevention in the Home Falls can cause injuries. They can happen to people of all ages. There are many things you can do to make your home safe and to help prevent falls. What can I do on the outside of my home? Regularly fix the edges of walkways and driveways and fix any cracks. Remove anything that might make you trip as you walk through a door, such as a raised step or threshold. Trim any bushes or trees on the path to your home. Use bright outdoor lighting. Clear any walking paths of anything that might make someone trip, such as rocks or tools. Regularly check to see if handrails are loose or broken. Make sure that both sides of any steps have handrails. Any raised decks and porches should have guardrails on the edges. Have any leaves, snow, or ice cleared regularly. Use sand or salt on walking paths during winter. Clean up any spills in your garage right away. This includes oil or grease spills. What can I do in the bathroom? Use night lights. Install grab bars by the toilet and in the tub and shower. Do not use towel bars as grab bars. Use non-skid mats or decals in the tub or  shower. If you need to sit down in the shower, use a plastic, non-slip stool. Keep the floor dry. Clean up any water that spills on the floor as soon as it happens. Remove soap buildup in the tub or shower regularly. Attach bath mats securely with double-sided non-slip rug tape. Do not have throw rugs and other things on the floor that can make you trip. What can I do in the bedroom? Use night lights. Make sure that you have a light by your bed that is easy to reach. Do not use any sheets or blankets that are too big for your bed. They should not hang down onto the floor. Have a firm chair that has side arms. You can use this for support while you get dressed. Do not have throw rugs and other things on the floor that can make you trip. What can I do in the kitchen? Clean up any spills right away. Avoid walking on wet floors. Keep items that you use a lot in easy-to-reach places. If you need to reach something above you, use a strong step stool that has a grab bar. Keep electrical  cords out of the way. Do not use floor polish or wax that makes floors slippery. If you must use wax, use non-skid floor wax. Do not have throw rugs and other things on the floor that can make you trip. What can I do with my stairs? Do not leave any items on the stairs. Make sure that there are handrails on both sides of the stairs and use them. Fix handrails that are broken or loose. Make sure that handrails are as long as the stairways. Check any carpeting to make sure that it is firmly attached to the stairs. Fix any carpet that is loose or worn. Avoid having throw rugs at the top or bottom of the stairs. If you do have throw rugs, attach them to the floor with carpet tape. Make sure that you have a light switch at the top of the stairs and the bottom of the stairs. If you do not have them, ask someone to add them for you. What else can I do to help prevent falls? Wear shoes that: Do not have high heels. Have  rubber bottoms. Are comfortable and fit you well. Are closed at the toe. Do not wear sandals. If you use a stepladder: Make sure that it is fully opened. Do not climb a closed stepladder. Make sure that both sides of the stepladder are locked into place. Ask someone to hold it for you, if possible. Clearly mark and make sure that you can see: Any grab bars or handrails. First and last steps. Where the edge of each step is. Use tools that help you move around (mobility aids) if they are needed. These include: Canes. Walkers. Scooters. Crutches. Turn on the lights when you go into a dark area. Replace any light bulbs as soon as they burn out. Set up your furniture so you have a clear path. Avoid moving your furniture around. If any of your floors are uneven, fix them. If there are any pets around you, be aware of where they are. Review your medicines with your doctor. Some medicines can make you feel dizzy. This can increase your chance of falling. Ask your doctor what other things that you can do to help prevent falls. This information is not intended to replace advice given to you by your health care provider. Make sure you discuss any questions you have with your health care provider. Document Released: 10/16/2008 Document Revised: 05/28/2015 Document Reviewed: 01/24/2014 Elsevier Interactive Patient Education  2017 [employer].

## 2021-10-22 MED ORDER — EPINEPHRINE 0.3 MG/0.3ML IJ SOAJ: 0.3000 mg | INTRAMUSCULAR | 1 refills | Status: DC | PRN

## 2021-10-22 NOTE — Addendum Note (Signed)
Addended by: Vivi Barrack on: 10/22/2021 09:17 AM   Modules accepted: Orders

## 2021-10-22 NOTE — Progress Notes (Deleted)
I have personally reviewed the Medicare Annual Wellness Visit and agree with the documentation.  Algis Greenhouse. Jerline Pain, MD 10/22/2021 9:16 AM

## 2021-10-25 ENCOUNTER — Other Ambulatory Visit (HOSPITAL_BASED_OUTPATIENT_CLINIC_OR_DEPARTMENT_OTHER): Payer: Self-pay

## 2021-10-25 MED ORDER — COMIRNATY 30 MCG/0.3ML IM SUSY
PREFILLED_SYRINGE | INTRAMUSCULAR | 0 refills | Status: DC
  Filled 2021-10-25: qty 0.3, 1d supply, fill #0

## 2021-10-26 ENCOUNTER — Encounter: Payer: Self-pay | Admitting: Family Medicine

## 2021-10-26 ENCOUNTER — Ambulatory Visit (INDEPENDENT_AMBULATORY_CARE_PROVIDER_SITE_OTHER): Payer: Medicare HMO | Admitting: Family Medicine

## 2021-10-26 VITALS — BP 130/81 | HR 88 | Temp 97.5°F | Ht 69.0 in | Wt 132.6 lb

## 2021-10-26 DIAGNOSIS — I159 Secondary hypertension, unspecified: Secondary | ICD-10-CM

## 2021-10-26 DIAGNOSIS — J449 Chronic obstructive pulmonary disease, unspecified: Secondary | ICD-10-CM | POA: Diagnosis not present

## 2021-10-26 DIAGNOSIS — I480 Paroxysmal atrial fibrillation: Secondary | ICD-10-CM | POA: Diagnosis not present

## 2021-10-26 MED ORDER — FLUTICASONE PROPIONATE 50 MCG/ACT NA SUSP: 2.0000 | NASAL | 11 refills | Status: DC | PRN

## 2021-10-26 MED ORDER — EPINEPHRINE 0.3 MG/0.3ML IJ SOAJ: 0.3000 mg | INTRAMUSCULAR | 1 refills | Status: AC | PRN

## 2021-10-26 NOTE — Assessment & Plan Note (Addendum)
Much better controlled today off midodrine.  We will continue without any medications that can continue to monitor at home.  We will recheck at next office visit.  May need to restart his losartan-HCTZ at some point though we can hold off on this for today.

## 2021-10-26 NOTE — Assessment & Plan Note (Signed)
Regular rate and rhythm today.  Continue management per cardiology. 

## 2021-10-26 NOTE — Assessment & Plan Note (Signed)
Continue management per pulmonology.  He is currently on Spiriva and Symbicort

## 2021-10-26 NOTE — Patient Instructions (Signed)
It was very nice to see you today!  Your blood pressure looks much better today.  I will refill your Flonase and EpiPen  We will see you back next year for your routine visit.  Please come back to see me sooner if needed.  Take care, Dr Jerline Pain  PLEASE NOTE:  If you had any lab tests please let us know if you have not heard back within a few days. You may see your results on mychart before we have a chance to review them but we will give you a call once they are reviewed by [place]. If we ordered any referrals today, please let us know if you have not heard from their office within the next week.   Please try these tips to maintain a healthy lifestyle:  Eat at least 3 REAL meals and 1-2 snacks per day.  Aim for no more than 5 hours between eating.  If you eat breakfast, please do so within one hour of getting up.   Each meal should contain half fruits/vegetables, one quarter protein, and one quarter carbs (no bigger than a computer mouse)  Cut down on sweet beverages. This includes juice, soda, and sweet tea.   Drink at least 1 glass of water with each meal and aim for at least 8 glasses per day  Exercise at least 150 minutes every week.

## 2021-10-26 NOTE — Progress Notes (Signed)
   Michael [name] is a 61 y.o. male who presents today for an office visit.  Assessment/Plan:  Chronic Problems Addressed Today: HTN (hypertension) Much better controlled today off midodrine.  We will continue without any medications that can continue to monitor at home.  We will recheck at next office visit.  May need to restart his losartan-HCTZ at some point though we can hold off on this for today.  Paroxysmal atrial fibrillation Regular rate and rhythm today.  Continue management per cardiology.  COPD GOLD III  Continue management per pulmonology.  He is currently on Spiriva and Symbicort    Subjective:  HPI:  See A/P for status of chronic conditions.  Patient is here today for blood pressure follow-up.  He saw him 2 weeks ago for hospitalization follow-up.  Blood pressure was elevated at that time in setting of midodrine use.  He stopped this a couple weeks ago.  He has done well.       Objective:  Physical Exam: BP 130/81   Pulse 88   Temp (!) 97.5 F (36.4 C) (Temporal)   Ht 5\' 9"  (1.753 m)   Wt 132 lb 9.6 oz (60.1 kg)   SpO2 99%   BMI 19.58 kg/m   Gen: No acute distress, resting comfortably CV: Regular rate and rhythm with no murmurs appreciated Pulm: Normal work of breathing, clear to auscultation bilaterally with no crackles, wheezes, or rhonchi Neuro: Grossly normal, moves all extremities Psych: Normal affect and thought content      Michael [name] M. Jerline Pain, MD 10/26/2021 2:21 PM

## 2021-11-02 ENCOUNTER — Ambulatory Visit (INDEPENDENT_AMBULATORY_CARE_PROVIDER_SITE_OTHER): Payer: Medicare HMO

## 2021-11-02 DIAGNOSIS — I495 Sick sinus syndrome: Secondary | ICD-10-CM

## 2021-11-04 LAB — CUP PACEART REMOTE DEVICE CHECK
Battery Impedance: 1146 Ohm
Battery Remaining Longevity: 66 mo
Battery Voltage: 2.77 V
Brady Statistic AP VP Percent: 0 %
Brady Statistic AP VS Percent: 49 %
Brady Statistic AS VP Percent: 0 %
Brady Statistic AS VS Percent: 50 %
Date Time Interrogation Session: 20231101150456
Implantable Lead Connection Status: 753985
Implantable Lead Connection Status: 753985
Implantable Lead Implant Date: 20050421
Implantable Lead Implant Date: 20050421
Implantable Lead Location: 753859
Implantable Lead Location: 753860
Implantable Lead Model: 5076
Implantable Lead Model: 5076
Implantable Pulse Generator Implant Date: 20140904
Lead Channel Impedance Value: 460 Ohm
Lead Channel Impedance Value: 593 Ohm
Lead Channel Pacing Threshold Amplitude: 0.75 V
Lead Channel Pacing Threshold Amplitude: 1 V
Lead Channel Pacing Threshold Pulse Width: 0.4 ms
Lead Channel Pacing Threshold Pulse Width: 0.4 ms
Lead Channel Setting Pacing Amplitude: 1.5 V
Lead Channel Setting Pacing Amplitude: 2 V
Lead Channel Setting Pacing Pulse Width: 0.4 ms
Lead Channel Setting Sensing Sensitivity: 5.6 mV
Zone Setting Status: 755011
Zone Setting Status: 755011

## 2021-11-17 NOTE — Progress Notes (Signed)
Remote pacemaker transmission.   

## 2021-11-19 ENCOUNTER — Other Ambulatory Visit: Payer: Self-pay | Admitting: *Deleted

## 2021-11-19 DIAGNOSIS — F17210 Nicotine dependence, cigarettes, uncomplicated: Secondary | ICD-10-CM

## 2021-11-19 DIAGNOSIS — Z87891 Personal history of nicotine dependence: Secondary | ICD-10-CM

## 2021-11-19 DIAGNOSIS — Z122 Encounter for screening for malignant neoplasm of respiratory organs: Secondary | ICD-10-CM

## 2022-01-04 ENCOUNTER — Encounter: Payer: Self-pay | Admitting: Acute Care

## 2022-01-04 ENCOUNTER — Ambulatory Visit (INDEPENDENT_AMBULATORY_CARE_PROVIDER_SITE_OTHER): Payer: Medicare HMO | Admitting: Acute Care

## 2022-01-04 DIAGNOSIS — F17210 Nicotine dependence, cigarettes, uncomplicated: Secondary | ICD-10-CM

## 2022-01-04 NOTE — Progress Notes (Signed)
Virtual Visit via Video Note  I connected with Michael [name] on 01/04/22 at  3:00 PM EST by a video enabled telemedicine application and verified that I am speaking with the correct person using two identifiers.  Location: Patient:  At home Provider:  [place], [place], [place], [address]    I discussed the limitations of evaluation and management by telemedicine and the availability of in person appointments. The patient expressed understanding and agreed to proceed.   Shared Decision Making Visit Lung Cancer Screening Program [phone])   Eligibility: Age 62 y.o. Pack Years Smoking History Calculation 44 pack year smoking history (# packs/per year x # years smoked) Recent History of coughing up blood  no Unexplained weight loss? no ( >Than 15 pounds within the last 6 months ) Prior History Lung / other cancer no (Diagnosis within the last 5 years already requiring surveillance chest CT Scans). Smoking Status Current Smoker Former Smokers: Years since quit:  NA  Quit Date:  NA  Visit Components: Discussion included one or more decision making aids. yes Discussion included risk/benefits of screening. yes Discussion included potential follow up diagnostic testing for abnormal scans. yes Discussion included meaning and risk of over diagnosis. yes Discussion included meaning and risk of False Positives. yes Discussion included meaning of total radiation exposure. yes  Counseling Included: Importance of adherence to annual lung cancer LDCT screening. yes Impact of comorbidities on ability to participate in the program. yes Ability and willingness to under diagnostic treatment. yes  Smoking Cessation Counseling: Current Smokers:  Discussed importance of smoking cessation. yes Information about tobacco cessation classes and interventions provided to patient. yes Patient provided with "ticket" for LDCT Scan. yes Symptomatic Patient. no  Counseling NA Diagnosis Code:  Tobacco Use Z72.0 Asymptomatic Patient yes  Counseling (Intermediate counseling: > three minutes counseling) S5053 Former Smokers:  Discussed the importance of maintaining cigarette abstinence. yes Diagnosis Code: Personal History of Nicotine Dependence. Z76.734 Information about tobacco cessation classes and interventions provided to patient. Yes Patient provided with "ticket" for LDCT Scan. yes Written Order for Lung Cancer Screening with LDCT placed in Epic. Yes (CT Chest Lung Cancer Screening Low Dose W/O CM) [id number] Z12.2-Screening of respiratory organs Z87.891-Personal history of nicotine dependence  I have spent 25 minutes of face to face/ virtual visit   time with  Michael [name]  discussing the risks and benefits of lung cancer screening. We viewed / discussed a power point together that explained in detail the above noted topics. We paused at intervals to allow for questions to be asked and answered to ensure understanding.We discussed that the single most powerful action that he can take to decrease his risk of developing lung cancer is to quit smoking. We discussed whether or not he is ready to commit to setting a quit date. We discussed options for tools to aid in quitting smoking including nicotine replacement therapy, non-nicotine medications, support groups, Quit Smart classes, and behavior modification. We discussed that often times setting smaller, more achievable goals, such as eliminating 1 cigarette a day for a week and then 2 cigarettes a day for a week can be helpful in slowly decreasing the number of cigarettes smoked. This allows for a sense of accomplishment as well as providing a clinical benefit. I provided  him  with smoking cessation  information  with contact information for community resources, classes, free nicotine replacement therapy, and access to mobile apps, text messaging, and on-line smoking cessation help. I have also provided  him  the office contact information in  the event he needs to contact me, or the screening staff. We discussed the time and location of the scan, and that either Doroteo Glassman RN, Joella Prince, RN  or I will call / send a letter with the results within 24-72 hours of receiving them. The patient verbalized understanding of all of  the above and had no further questions upon leaving the office. They have my contact information in the event they have any further questions.  I spent 3 minutes counseling on smoking cessation and the health risks of continued tobacco abuse.  I explained to the patient that there has been a high incidence of coronary artery disease noted on these exams. I explained that this is a non-gated exam therefore degree or severity cannot be determined. This patient is not on statin therapy. I have asked the patient to follow-up with their PCP regarding any incidental finding of coronary artery disease and management with diet or medication as their PCP  feels is clinically indicated. The patient verbalized understanding of the above and had no further questions upon completion of the visit.      Magdalen Spatz, NP 01/04/2022

## 2022-01-04 NOTE — Patient Instructions (Signed)
Thank you for participating in the [facility]. It was our pleasure to meet you today. We will call you with the results of your scan within the next few days. Your scan will be assigned a Lung RADS category score by the physicians reading the scans.  This Lung RADS score determines follow up scanning.  See below for description of categories, and follow up screening recommendations. We will be in touch to schedule your follow up screening annually or based on recommendations of our providers. We will fax a copy of your scan results to your Primary Care Physician, or the physician who referred you to the program, to ensure they have the results. Please call the office if you have any questions or concerns regarding your scanning experience or results.  Our office number is [phone]. Please speak with Denise Phelps, RN. , or  Denise Buckner RN, They are  our Lung Cancer Screening RN.'s If They are unavailable when you call, Please leave a message on the voice mail. We will return your call at our earliest convenience.This voice mail is monitored several times a day.  Remember, if your scan is normal, we will scan you annually as long as you continue to meet the criteria for the program. (Age 55-77, Current smoker or smoker who has quit within the last 15 years). If you are a smoker, remember, quitting is the single most powerful action that you can take to decrease your risk of lung cancer and other pulmonary, breathing related problems. We know quitting is hard, and we are here to help.  Please let us know if there is anything we can do to help you meet your goal of quitting. If you are a former smoker, congratulations. We are proud of you! Remain smoke free! Remember you can refer friends or family members through the number above.  We will screen them to make sure they meet criteria for the program. Thank you for helping us take better care of you by  participating in Lung Screening.  You can receive free nicotine replacement therapy ( patches, gum or mints) by calling 1-800-QUIT NOW. Please call so we can get you on the path to becoming  a non-smoker. I know it is hard, but you can do this!  Lung RADS Categories:  Lung RADS 1: no nodules or definitely non-concerning nodules.  Recommendation is for a repeat annual scan in 12 months.  Lung RADS 2:  nodules that are non-concerning in appearance and behavior with a very low likelihood of becoming an active cancer. Recommendation is for a repeat annual scan in 12 months.  Lung RADS 3: nodules that are probably non-concerning , includes nodules with a low likelihood of becoming an active cancer.  Recommendation is for a 6-month repeat screening scan. Often noted after an upper respiratory illness. We will be in touch to make sure you have no questions, and to schedule your 6-month scan.  Lung RADS 4 A: nodules with concerning findings, recommendation is most often for a follow up scan in 3 months or additional testing based on our provider's assessment of the scan. We will be in touch to make sure you have no questions and to schedule the recommended 3 month follow up scan.  Lung RADS 4 B:  indicates findings that are concerning. We will be in touch with you to schedule additional diagnostic testing based on our provider's  assessment of the scan.  Other options for assistance in smoking cessation (   As covered by your insurance benefits)  Hypnosis for smoking cessation  [employer] [phone]  Acupuncture for smoking cessation  [facility] [phone]   

## 2022-01-05 ENCOUNTER — Ambulatory Visit (HOSPITAL_COMMUNITY)
Admission: RE | Admit: 2022-01-05 | Discharge: 2022-01-05 | Disposition: A | Discharge status: Home / Self Care | Payer: Medicare HMO | Source: Ambulatory Visit | Attending: Acute Care | Admitting: Acute Care

## 2022-01-05 DIAGNOSIS — F17210 Nicotine dependence, cigarettes, uncomplicated: Secondary | ICD-10-CM | POA: Insufficient documentation

## 2022-01-05 DIAGNOSIS — Z87891 Personal history of nicotine dependence: Secondary | ICD-10-CM | POA: Insufficient documentation

## 2022-01-05 DIAGNOSIS — Z122 Encounter for screening for malignant neoplasm of respiratory organs: Secondary | ICD-10-CM | POA: Diagnosis not present

## 2022-01-10 ENCOUNTER — Other Ambulatory Visit: Payer: Self-pay

## 2022-01-10 ENCOUNTER — Telehealth: Payer: Self-pay | Admitting: Acute Care

## 2022-01-10 DIAGNOSIS — R911 Solitary pulmonary nodule: Secondary | ICD-10-CM

## 2022-01-10 DIAGNOSIS — Z87891 Personal history of nicotine dependence: Secondary | ICD-10-CM

## 2022-01-10 NOTE — Telephone Encounter (Signed)
Spoke with patient, using two identifiers, to review results of LDCT.  Atherosclerosis and emphysema noted. Patient is followed by cardiology.  Dilation of thoracic aorta is noted and patient reminded the importance of keeping blood pressure under control.  Recommendation to repeat LDCT to follow up on nodule, as no recent comparison imaging.  Likely benign finding but as precaution, would prefer to repeat LDCT in 6 months versus 12 months.  Patient is in agreement.  Patient advises his mother has permission to make follow up appointment for LDCT, as she assists him with keeping up with his appointments.  Phone number to [facility] was provided. Patient also requests copy of results be faxed to PCP and to cardiology.  Results with plan for repeat CT has been faxed to both providers.  New order placed for 6 month follow up [facility] LDCT.  Patient verbalized understanding and mother [name]) had also listened in on call.

## 2022-02-01 ENCOUNTER — Ambulatory Visit: Payer: Medicare HMO

## 2022-02-01 DIAGNOSIS — I495 Sick sinus syndrome: Secondary | ICD-10-CM | POA: Diagnosis not present

## 2022-02-02 LAB — CUP PACEART REMOTE DEVICE CHECK
Battery Impedance: 1281 Ohm
Battery Remaining Longevity: 61 mo
Battery Voltage: 2.77 V
Brady Statistic AP VP Percent: 0 %
Brady Statistic AP VS Percent: 51 %
Brady Statistic AS VP Percent: 0 %
Brady Statistic AS VS Percent: 48 %
Date Time Interrogation Session: 20240131113455
Implantable Lead Connection Status: 753985
Implantable Lead Connection Status: 753985
Implantable Lead Implant Date: 20050421
Implantable Lead Implant Date: 20050421
Implantable Lead Location: 753859
Implantable Lead Location: 753860
Implantable Lead Model: 5076
Implantable Lead Model: 5076
Implantable Pulse Generator Implant Date: 20140904
Lead Channel Impedance Value: 423 Ohm
Lead Channel Impedance Value: 560 Ohm
Lead Channel Pacing Threshold Amplitude: 0.75 V
Lead Channel Pacing Threshold Amplitude: 0.75 V
Lead Channel Pacing Threshold Pulse Width: 0.4 ms
Lead Channel Pacing Threshold Pulse Width: 0.4 ms
Lead Channel Setting Pacing Amplitude: 1.5 V
Lead Channel Setting Pacing Amplitude: 2 V
Lead Channel Setting Pacing Pulse Width: 0.4 ms
Lead Channel Setting Sensing Sensitivity: 5.6 mV
Zone Setting Status: 755011
Zone Setting Status: 755011

## 2022-02-28 NOTE — Progress Notes (Signed)
Remote pacemaker transmission.   

## 2022-03-14 ENCOUNTER — Encounter: Payer: Self-pay | Admitting: Family Medicine

## 2022-03-14 ENCOUNTER — Ambulatory Visit (INDEPENDENT_AMBULATORY_CARE_PROVIDER_SITE_OTHER): Payer: Medicare HMO | Admitting: Family Medicine

## 2022-03-14 VITALS — BP 105/67 | HR 72 | Temp 98.0°F | Ht 69.0 in | Wt 131.8 lb

## 2022-03-14 DIAGNOSIS — I159 Secondary hypertension, unspecified: Secondary | ICD-10-CM

## 2022-03-14 DIAGNOSIS — J449 Chronic obstructive pulmonary disease, unspecified: Secondary | ICD-10-CM

## 2022-03-14 DIAGNOSIS — K219 Gastro-esophageal reflux disease without esophagitis: Secondary | ICD-10-CM

## 2022-03-14 MED ORDER — PREDNISONE 50 MG PO TABS: ORAL_TABLET | ORAL | 0 refills | Status: DC

## 2022-03-14 MED ORDER — AZITHROMYCIN 250 MG PO TABS: ORAL_TABLET | ORAL | 0 refills | Status: DC

## 2022-03-14 MED ORDER — ONDANSETRON HCL 4 MG PO TABS: 4.0000 mg | ORAL_TABLET | Freq: Three times a day (TID) | ORAL | 0 refills | Status: DC | PRN

## 2022-03-14 NOTE — Assessment & Plan Note (Signed)
Acute flare today.  Speaking in full sentences and no signs of respiratory distress.  Will start azithromycin and prednisone.  He can continue his home inhalers with albuterol as needed.  He will also continue his Spiriva and Symbicort.  He will let us know if not improving.

## 2022-03-14 NOTE — Patient Instructions (Signed)
It was very nice to see you today!  Please start the prednisone and azithromycin.  Use the Zofran as needed.  Let me know if not improving.  Take care, Dr Jerline Pain  PLEASE NOTE:  If you had any lab tests, please let us know if you have not heard back within a few days. You may see your results on mychart before we have a chance to review them but we will give you a call once they are reviewed by [place].   If we ordered any referrals today, please let us know if you have not heard from their office within the next week.   If you had any urgent prescriptions sent in today, please check with the pharmacy within an hour of our visit to make sure the prescription was transmitted appropriately.   Please try these tips to maintain a healthy lifestyle:  Eat at least 3 REAL meals and 1-2 snacks per day.  Aim for no more than 5 hours between eating.  If you eat breakfast, please do so within one hour of getting up.   Each meal should contain half fruits/vegetables, one quarter protein, and one quarter carbs (no bigger than a computer mouse)  Cut down on sweet beverages. This includes juice, soda, and sweet tea.   Drink at least 1 glass of water with each meal and aim for at least 8 glasses per day  Exercise at least 150 minutes every week.

## 2022-03-14 NOTE — Progress Notes (Signed)
   Michael [name] is a 62 y.o. male who presents today for an office visit.  Assessment/Plan:  New/Acute Problems: Abdominal Pain Possible related to his COPD flare as below.  Reassuring exam today without any peritoneal signs.  He is having slight amount of nausea after eating.  Will send in prescription for Zofran.  Given his overall reassuring exam today do not think we need to obtain stat imaging.  Concern for possible PUD given his history of reflux.  Biliary colic is also a consideration however less likely.  We did discuss further testing and referral however they declined at this time.  They would like to treat his COPD exacerbation first which is reasonable.  If symptoms persist despite treatment for COPD flare would consider CT scan versus GI referral.  We discussed reasons to return to care or seek emergent care.  Chronic Problems Addressed Today: COPD GOLD III  Acute flare today.  Speaking in full sentences and no signs of respiratory distress.  Will start azithromycin and prednisone.  He can continue his home inhalers with albuterol as needed.  He will also continue his Spiriva and Symbicort.  He will let us know if not improving.  HTN (hypertension) Blood pressure at goal today without meds.  GERD (gastroesophageal reflux disease) On omeprazole daily.  He is having some GI discomfort with eating as above which may be related to ingesting air during his coughing fits.  We did discuss referral to GI to rule out PUD however he deferred.  They will let us know if symptoms do not improve and would consider referral at that time.      Subjective:  HPI:  See A/p for status of chronic conditions. Main concern today is cough. Seems to be about the same. Coughing up dark phlegm. Some trouble breathing. Tried using home inhalers without much improvement. Some chills. No fevers. No known sick contacts.  HE also has some abdominal pain. He has had decreased appetite.  He thinks abdominal pain is  related to ingesting air due to his coughing fits. Some nausea after eating. Pain is worse after eating.        Objective:  Physical Exam: BP 105/67   Pulse 72   Temp 98 F (36.7 C) (Temporal)   Ht 5\' 9"  (1.753 m)   Wt 131 lb 12.8 oz (59.8 kg)   SpO2 98%   BMI 19.46 kg/m   Wt Readings from Last 3 Encounters:  03/14/22 131 lb 12.8 oz (59.8 kg)  10/26/21 132 lb 9.6 oz (60.1 kg)  10/13/21 133 lb 12.8 oz (60.7 kg)  Gen: No acute distress, resting comfortably CV: Regular rate and rhythm with no murmurs appreciated Pulm: Normal work of breathing, frequent coughing noted.  Diffuse rhonchi.  End expiratory wheezes. GI: Soft, nontender, nondistended. Neuro: Grossly normal, moves all extremities Psych: Normal affect and thought content      Aston Lawhorn M. Jerline Pain, MD 03/14/2022 11:39 AM

## 2022-03-14 NOTE — Assessment & Plan Note (Signed)
On omeprazole daily.  He is having some GI discomfort with eating as above which may be related to ingesting air during his coughing fits.  We did discuss referral to GI to rule out PUD however he deferred.  They will let us know if symptoms do not improve and would consider referral at that time.

## 2022-03-14 NOTE — Assessment & Plan Note (Signed)
Blood pressure at goal today without meds. 

## 2022-03-24 ENCOUNTER — Telehealth: Payer: Self-pay | Admitting: Family Medicine

## 2022-03-24 NOTE — Telephone Encounter (Signed)
Pt's mother states pt has finished rx & was told to let Jerline Pain know how he was. He is not any better. Asking if there is another rx that he can take. Please advise.

## 2022-03-24 NOTE — Telephone Encounter (Signed)
Caller called for update.

## 2022-03-24 NOTE — Telephone Encounter (Signed)
We can try doxycycline 100mg  bid x 7 days but if still not improving we will need to check imaging as we discussed at his office visit.  Algis Greenhouse. Jerline Pain, MD 03/24/2022 12:13 PM

## 2022-03-24 NOTE — Telephone Encounter (Signed)
Please advise 

## 2022-03-25 ENCOUNTER — Telehealth: Payer: Self-pay | Admitting: Cardiovascular Disease

## 2022-03-25 ENCOUNTER — Other Ambulatory Visit: Payer: Self-pay

## 2022-03-25 MED ORDER — DOXYCYCLINE HYCLATE 100 MG PO TABS: 100.0000 mg | ORAL_TABLET | Freq: Two times a day (BID) | ORAL | 0 refills | Status: DC

## 2022-03-25 NOTE — Telephone Encounter (Signed)
Spoke with patient mom and we have not received results from device clinic as of yet. Will forward message to device clinic.

## 2022-03-25 NOTE — Telephone Encounter (Signed)
Stated just wanted to call to let nurses be on the look out for the reading on the pace maker and see if anything is wrong or if he may need to come in.

## 2022-03-25 NOTE — Telephone Encounter (Signed)
Patient's mother is aware that script has been sent to pharmacy. Aware to let us know if there is not any improvement with medication.

## 2022-03-25 NOTE — Telephone Encounter (Signed)
Mother stated they did a manual transmission for the patient and they are calling to check if transmission was received.

## 2022-03-28 ENCOUNTER — Telehealth: Payer: Self-pay | Admitting: Internal Medicine

## 2022-03-28 NOTE — Telephone Encounter (Signed)
[name] mother is returning phone call. [name] phone number is [phone].

## 2022-03-28 NOTE — Telephone Encounter (Signed)
No evidence of any recent abnormalities.  The last time he had any tachycardia was a 4-second episode on March 14 at 2AM.  He has not had any meaningful sustained arrhythmia since last May 2023.  Pacemaker function is normal.

## 2022-03-28 NOTE — Telephone Encounter (Signed)
No evidence of any recent abnormalities.  The last time he had any tachycardia was a 4-second episode on March 14 at 2AM.  He has not had any meaningful sustained arrhythmia since last May 2023.  Pacemaker function is normal.   Elizabeth Sauer (pt's mother) the above information from Dr [employer]. She verbalized understanding. Instructed to call with any questions/concerns.

## 2022-03-28 NOTE — Telephone Encounter (Signed)
Michael [name] mother states patient having symptoms of  shortness of breath and cough. Pharmacy is [facility]. Michael [name] phone number is [phone].

## 2022-03-28 NOTE — Telephone Encounter (Signed)
ATC pt mother to get further detail about pts symps.

## 2022-03-28 NOTE — Telephone Encounter (Signed)
Called pt and his mother. Let them we did received the transmission and the nurse stated there was brief tachycardia. Result will be forwarded to Dr. Loletha Grayer for review.

## 2022-03-28 NOTE — Telephone Encounter (Signed)
Manual transmission received:  Normal device function.  Brief HVR episodes.

## 2022-03-28 NOTE — Telephone Encounter (Signed)
Called and spoke w/ pts mother for 3 weeks pt has been increasingly coughing w/ SOB and decreased appetite. Unsure if pt is running a fever.   Appt scheduled on 3/28 @ 11:30am. MW aware.   Closing encounter.

## 2022-03-31 ENCOUNTER — Ambulatory Visit (INDEPENDENT_AMBULATORY_CARE_PROVIDER_SITE_OTHER): Payer: Medicare HMO

## 2022-03-31 ENCOUNTER — Ambulatory Visit: Payer: Medicare HMO | Admitting: Nurse Practitioner

## 2022-03-31 ENCOUNTER — Ambulatory Visit (HOSPITAL_COMMUNITY)
Admission: RE | Admit: 2022-03-31 | Discharge: 2022-03-31 | Disposition: A | Discharge status: Home / Self Care | Payer: Medicare HMO | Source: Ambulatory Visit | Attending: Nurse Practitioner | Admitting: Nurse Practitioner

## 2022-03-31 ENCOUNTER — Encounter: Payer: Self-pay | Admitting: Nurse Practitioner

## 2022-03-31 VITALS — BP 126/82 | HR 73 | Temp 97.6°F | Ht 69.0 in | Wt 132.2 lb

## 2022-03-31 DIAGNOSIS — J441 Chronic obstructive pulmonary disease with (acute) exacerbation: Secondary | ICD-10-CM

## 2022-03-31 DIAGNOSIS — F17210 Nicotine dependence, cigarettes, uncomplicated: Secondary | ICD-10-CM

## 2022-03-31 DIAGNOSIS — J440 Chronic obstructive pulmonary disease with acute lower respiratory infection: Secondary | ICD-10-CM | POA: Diagnosis not present

## 2022-03-31 DIAGNOSIS — R042 Hemoptysis: Secondary | ICD-10-CM

## 2022-03-31 DIAGNOSIS — J209 Acute bronchitis, unspecified: Secondary | ICD-10-CM

## 2022-03-31 DIAGNOSIS — J449 Chronic obstructive pulmonary disease, unspecified: Secondary | ICD-10-CM | POA: Diagnosis not present

## 2022-03-31 DIAGNOSIS — I7121 Aneurysm of the ascending aorta, without rupture: Secondary | ICD-10-CM | POA: Diagnosis not present

## 2022-03-31 DIAGNOSIS — J439 Emphysema, unspecified: Secondary | ICD-10-CM | POA: Diagnosis not present

## 2022-03-31 DIAGNOSIS — R059 Cough, unspecified: Secondary | ICD-10-CM | POA: Diagnosis not present

## 2022-03-31 LAB — CBC WITH DIFFERENTIAL/PLATELET
Basophils Absolute: 0.1 10*3/uL (ref 0.0–0.1)
Basophils Relative: 1 % (ref 0.0–3.0)
Eosinophils Absolute: 0.2 10*3/uL (ref 0.0–0.7)
Eosinophils Relative: 2.4 % (ref 0.0–5.0)
HCT: 39.9 % (ref 39.0–52.0)
Hemoglobin: 13.6 g/dL (ref 13.0–17.0)
Lymphocytes Relative: 22.7 % (ref 12.0–46.0)
Lymphs Abs: 1.8 10*3/uL (ref 0.7–4.0)
MCHC: 34.2 g/dL (ref 30.0–36.0)
MCV: 98.1 fl (ref 78.0–100.0)
Monocytes Absolute: 0.7 10*3/uL (ref 0.1–1.0)
Monocytes Relative: 9.2 % (ref 3.0–12.0)
Neutro Abs: 5 10*3/uL (ref 1.4–7.7)
Neutrophils Relative %: 64.7 % (ref 43.0–77.0)
Platelets: 255 10*3/uL (ref 150.0–400.0)
RBC: 4.06 Mil/uL — ABNORMAL LOW (ref 4.22–5.81)
RDW: 13.4 % (ref 11.5–15.5)
WBC: 7.8 10*3/uL (ref 4.0–10.5)

## 2022-03-31 LAB — BASIC METABOLIC PANEL
BUN: 19 mg/dL (ref 6–23)
CO2: 30 mEq/L (ref 19–32)
Calcium: 9.5 mg/dL (ref 8.4–10.5)
Chloride: 102 mEq/L (ref 96–112)
Creatinine, Ser: 0.86 mg/dL (ref 0.40–1.50)
GFR: 93.63 mL/min (ref >60.00)
Glucose, Bld: 75 mg/dL (ref 70–99)
Potassium: 4.2 mEq/L (ref 3.5–5.1)
Sodium: 136 mEq/L (ref 135–145)

## 2022-03-31 MED ORDER — SPIRIVA RESPIMAT 2.5 MCG/ACT IN AERS: 2.0000 | INHALATION_SPRAY | Freq: Every day | RESPIRATORY_TRACT | 3 refills | Status: DC

## 2022-03-31 MED ORDER — SYMBICORT 160-4.5 MCG/ACT IN AERO: INHALATION_SPRAY | RESPIRATORY_TRACT | 3 refills | Status: DC

## 2022-03-31 MED ORDER — VENTOLIN HFA 108 (90 BASE) MCG/ACT IN AERS: INHALATION_SPRAY | RESPIRATORY_TRACT | 3 refills | Status: DC

## 2022-03-31 MED ORDER — IOHEXOL 350 MG/ML SOLN
75.0000 mL | Freq: Once | INTRAVENOUS | Status: AC | PRN
  Administered 2022-03-31: 75 mL via INTRAVENOUS

## 2022-03-31 MED ORDER — ALBUTEROL SULFATE 1.25 MG/3ML IN NEBU: INHALATION_SOLUTION | RESPIRATORY_TRACT | 3 refills | Status: DC

## 2022-03-31 NOTE — Progress Notes (Signed)
@Patient  ID: Michael [name], male    DOB: 1960/02/28, 62 y.o.   MRN: [id number]  Chief Complaint  Patient presents with   Follow-up    Pt has been dealing with this cough. Pt states the last 3 weeks his cough has gotten worse, as well as sob.    Referring provider: Vivi Barrack, MD  HPI: 62 year old male, active smoker followed for COPD mixed type. He is a patient of Dr. Gustavus Bryant and last seen in office 10/13/2021. He is also followed by the lung cancer screening program. Past medical history significant for PAF, sinus node dysfunction s/p pacemaker, HTN, GERD, hypothyroid, PTSD, anxiety, depression, chronic pain syndrome.   TEST/EVENTS:  12/08/2015 spirometry: FVC 87, FEV1 46, ratio 40 01/05/2022 LDCT chest: Atherosclerosis.  Ascending thoracic 4 cm aneurysm.  Severe centrilobular and paraseptal emphysema.  Postinflammatory/infectious confluent scarring with architectural distortion and volume loss.  Tiny nodule in the right upper lobe, 4 mm.  Right upper lobe nodular density with surrounding scarring measuring 7.5 mm.  Lung RADS 3, probably benign.  10/13/2021: OV with Dr. Melvyn Novas. Maintained on Symbicort/Spiriva. Clear mucus in AM. Takes several hours to clear it. Referred to lung cancer screening program. Recounseled on smoking cessation.   03/31/2022: Today - acute Patient presents today for acute visit with his mom. They tell me that over the past 3 weeks, he has been struggling with an increased productive cough and shortness of breath. He was treated by his PCP with azithromycin and prednisone with minimal change. He continued to have a productive cough with brown phlegm. He was put on doxycycline, which he completed today. He still hasn't noticed much of a difference in his symptoms. He is still producing sputum, feeling more short winded, and having chest congestion. He also has had a few episodes of hemoptysis. Last occurrence was 3 days ago with a large amount of bright red blood. He takes a  baby aspirin but no anticoagulation therapy. His appetite is decreased and he feels very fatigued. He has noticed some more wheezing. He denies fevers, chills, calf pain, leg swelling, orthopnea. He is using his Symbicort and Spiriva. Only uses his nebs if he feels like he really needs it. Hasn't in a few days. He's not taking any mucinex. He did reach out to cardiology as they weren't sure if something was wrong with his pacemaker. They were assured that it seemed to be functioning appropriately.    Allergies  Allergen Reactions   Shellfish Allergy Anaphylaxis    Immunization History  Administered Date(s) Administered   COVID-19, mRNA, vaccine(Comirnaty)12 years and older 10/25/2021   Influenza Whole 09/13/2021   Influenza, High Dose Seasonal PF 10/11/2017   Influenza, Quadrivalent, Recombinant, Inj, Pf 11/02/2018   Influenza,inj,Quad PF,6+ Mos 09/02/2013, 08/25/2015, 10/29/2016, 10/06/2020   Influenza,inj,quad, With Preservative 11/02/2018   Influenza-Unspecified 09/23/2019   PFIZER Comirnaty(Gray Top)Covid-19 Tri-Sucrose Vaccine 04/28/2020   PFIZER(Purple Top)SARS-COV-2 Vaccination 03/21/2019, 04/15/2019, 11/02/2019   Pfizer Covid-19 Vaccine Bivalent Booster 60yrs & up 10/21/2020   Pneumococcal Polysaccharide-23 06/15/2013   Respiratory Syncytial Virus Vaccine,Recomb Aduvanted(Arexvy) 10/13/2021   Tdap 07/09/2013, 12/10/2019, 10/01/2021   Zoster Recombinat (Shingrix) 09/26/2019, 12/10/2019    Past Medical History:  Diagnosis Date   Allergy    Anginal pain (HCC)    Anxiety    Arthritis    Bradycardia    CHF (congestive heart failure) (HCC)    Chronic chest pain    COPD (chronic obstructive pulmonary disease) ([place])    Depression  Emphysema of lung (HCC)    GERD (gastroesophageal reflux disease)    History of nuclear stress test 06/14/2011   lexiscan; mild perfusion defect in basal inferosetpal & apical inferior region; negative for ischemia    Hypothyroidism    Pacemaker     Pneumonia    Presence of permanent cardiac pacemaker April 2005   For bradycardia   Seizure disorder ([place])    Seizures ([place])    as a baby   Shortness of breath    Tobacco abuse     Tobacco History: Social History   Tobacco Use  Smoking Status Every Day   Packs/day: 1.00   Years: 44.00   Additional pack years: 0.00   Total pack years: 44.00   Types: Cigarettes  Smokeless Tobacco Never   Ready to quit: Not Answered Counseling given: Not Answered   Outpatient Medications Prior to Visit  Medication Sig Dispense Refill   aspirin EC 81 MG tablet Take 81 mg by mouth daily. Swallow whole.     Blood Pressure KIT Use daily as needed to check blood pressure 1 kit 0   doxycycline (VIBRA-TABS) 100 MG tablet Take 1 tablet (100 mg total) by mouth 2 (two) times daily. (Patient not taking: Reported on 03/31/2022) 14 tablet 0   EPINEPHrine 0.3 mg/0.3 mL IJ SOAJ injection Inject 0.3 mg into the muscle as needed for anaphylaxis. 1 each 1   FLUoxetine (PROZAC) 40 MG capsule Take 2 capsules (80 mg total) by mouth daily. 180 capsule 3   fluticasone (FLONASE) 50 MCG/ACT nasal spray Place 2 sprays into both nostrils as needed. 16 g 11   omeprazole (PRILOSEC) 20 MG capsule Take 1 capsule (20 mg total) by mouth daily. 90 capsule 3   ondansetron (ZOFRAN) 4 MG tablet Take 1 tablet (4 mg total) by mouth every 8 (eight) hours as needed for nausea or vomiting. 20 tablet 0   Vitamin D, Ergocalciferol, (DRISDOL) 1.25 MG (50000 UNIT) CAPS capsule Take 1 capsule (50,000 Units total) by mouth every Sunday. 12 capsule 3   albuterol (ACCUNEB) 1.25 MG/3ML nebulizer solution 2.5 mg every 4 hours as needed for cough, shortness of breath and/or wheezing 75 mL 3   SYMBICORT 160-4.5 MCG/ACT inhaler Take 2 puffs first thing in am and then another 2 puffs about 12 hours later. 30.6 g 3   Tiotropium Bromide Monohydrate (SPIRIVA RESPIMAT) 2.5 MCG/ACT AERS Inhale 2 puffs into the lungs daily. 12 g 3   VENTOLIN HFA 108 (90  Base) MCG/ACT inhaler INHALE UP TO  2 PUFFS BY MOUTH EVERY 4 HOURS AS NEEDED 54 g 3   azithromycin (ZITHROMAX) 250 MG tablet Take 2 tabs day 1, then 1 tab daily (Patient not taking: Reported on 03/31/2022) 6 each 0   predniSONE (DELTASONE) 50 MG tablet Take 1 tablet daily for 5 days. Then take 1/2 tablet daily for 2 days. (Patient not taking: Reported on 03/31/2022) 6 tablet 0   No facility-administered medications prior to visit.     Review of Systems:   Constitutional: No weight loss or gain, night sweats, fevers, chills. +fatigue, lassitude. HEENT: No headaches, difficulty swallowing, tooth/dental problems, or sore throat. No sneezing, itching, ear ache, nasal congestion, or post nasal drip CV:  No chest pain, orthopnea, PND, swelling in lower extremities, anasarca, dizziness, palpitations, syncope Resp: +shortness of breath with exertion; productive cough; hemoptysis; wheezing. No chest wall deformity GI:  +decreased appetite. No heartburn, indigestion, abdominal pain, nausea, vomiting, diarrhea, change in bowel habits, bloody stools.  GU: No dysuria, change in color of urine, urgency or frequency.  Skin: No rash, lesions, ulcerations MSK:  No joint pain or swelling.   Neuro: No dizziness or lightheadedness.  Psych: No depression or anxiety. Mood stable.     Physical Exam:  BP 126/82   Pulse 73   Temp 97.6 F (36.4 C) (Oral)   Ht 5\' 9"  (1.753 m)   Wt 132 lb 3.2 oz (60 kg)   SpO2 98%   BMI 19.52 kg/m   GEN: Pleasant, interactive, well-kempt; non-toxic and in no acute distress. HEENT:  Normocephalic and atraumatic. PERRLA. Sclera white. Nasal turbinates pink, moist and patent bilaterally. No rhinorrhea present. Oropharynx pink and moist, without exudate or edema. No lesions, ulcerations, or postnasal drip.  NECK:  Supple w/ fair ROM. No JVD present. Normal carotid impulses w/o bruits. Thyroid symmetrical with no goiter or nodules palpated. No lymphadenopathy.   CV: RRR, no  m/r/g, no peripheral edema. Pulses intact, +2 bilaterally. No cyanosis, pallor or clubbing. PULMONARY:  Unlabored, regular breathing. Scattered rhonchi bilaterally A&P. No accessory muscle use.  GI: BS present and normoactive. Soft, non-tender to palpation. No organomegaly or masses detected.  MSK: No erythema, warmth or tenderness. Cap refil <2 sec all extrem. No deformities or joint swelling noted.  Neuro: A/Ox3. No focal deficits noted.   Skin: Warm, no lesions or rashe Psych: Normal affect and behavior. Judgement and thought content appropriate.     Lab Results:  CBC    Component Value Date/Time   WBC 7.8 03/31/2022 1223   RBC 4.06 (L) 03/31/2022 1223   HGB 13.6 03/31/2022 1223   HCT 39.9 03/31/2022 1223   PLT 255.0 03/31/2022 1223   MCV 98.1 03/31/2022 1223   MCH 33.3 10/02/2021 0429   MCHC 34.2 03/31/2022 1223   RDW 13.4 03/31/2022 1223   LYMPHSABS 1.8 03/31/2022 1223   MONOABS 0.7 03/31/2022 1223   EOSABS 0.2 03/31/2022 1223   BASOSABS 0.1 03/31/2022 1223    BMET    Component Value Date/Time   NA 136 03/31/2022 1223   K 4.2 03/31/2022 1223   CL 102 03/31/2022 1223   CO2 30 03/31/2022 1223   GLUCOSE 75 03/31/2022 1223   BUN 19 03/31/2022 1223   CREATININE 0.86 03/31/2022 1223   CREATININE 1.03 07/28/2015 1202   CALCIUM 9.5 03/31/2022 1223   GFRNONAA >60 10/02/2021 0429   GFRNONAA 82 07/28/2015 1202   GFRAA >60 03/18/2017 0254   GFRAA >89 07/28/2015 1202    BNP No results found for: "BNP"   Imaging:  CT Angio Chest Pulmonary Embolism (PE) W or WO Contrast  Addendum Date: 03/31/2022   ADDENDUM REPORT: 03/31/2022 16:16 ADDENDUM: Nodule identified in right upper lobe on prior exam not well visualized currently, perhaps due to the fact that thin-section imaging was not utilized. Short-term follow-up in 6 months is recommended with repeat low-dose chest CT without contrast as recommended on prior exam. Electronically Signed   By: Marijo Conception M.D.   On:  03/31/2022 16:16   Result Date: 03/31/2022 CLINICAL DATA:  Cough, hemoptysis. EXAM: CT ANGIOGRAPHY CHEST WITH CONTRAST TECHNIQUE: Multidetector CT imaging of the chest was performed using the standard protocol during bolus administration of intravenous contrast. Multiplanar CT image reconstructions and MIPs were obtained to evaluate the vascular anatomy. RADIATION DOSE REDUCTION: This exam was performed according to the departmental dose-optimization program which includes automated exposure control, adjustment of the mA and/or kV according to patient size and/or use of iterative reconstruction  technique. CONTRAST:  10mL OMNIPAQUE IOHEXOL 350 MG/ML SOLN COMPARISON:  January 05, 2022. FINDINGS: Cardiovascular: Satisfactory opacification of the pulmonary arteries to the segmental level. No evidence of pulmonary embolism. Normal heart size. No pericardial effusion. 4.0 cm ascending thoracic aortic aneurysm is noted. Mediastinum/Nodes: No enlarged mediastinal, hilar, or axillary lymph nodes. Thyroid gland, trachea, and esophagus demonstrate no significant findings. Lungs/Pleura: No pneumothorax or pleural effusion is noted. Emphysematous disease is noted predominantly in the upper lobes. Stable scarring is noted anteriorly in the left upper lobe. Upper Abdomen: No acute abnormality. Musculoskeletal: No chest wall abnormality. No acute or significant osseous findings. Review of the MIP images confirms the above findings. IMPRESSION: No definite evidence of pulmonary embolus. 4 cm ascending thoracic aortic aneurysm. Recommend annual imaging followup by CTA or MRA. This recommendation follows 2010 ACCF/AHA/AATS/ACR/ASA/SCA/SCAI/SIR/STS/SVM Guidelines for the Diagnosis and Management of Patients with Thoracic Aortic Disease. Circulation. 2010; [id number]. Aortic aneurysm NOS (ICD10-I71.9). Stable probable scarring is seen in the anterior portion of left upper lobe. Given the history of tobacco use, attention to this  abnormality on follow-up imaging is recommended to ensure stability. Aortic Atherosclerosis (ICD10-I70.0) and Emphysema (ICD10-J43.9). Electronically Signed: By: Marijo Conception M.D. On: 03/31/2022 14:17   DG Chest 2 View  Result Date: 03/31/2022 CLINICAL DATA:  Acute exacerbation of COPD, persistent cough despite antibiotics EXAM: CHEST - 2 VIEW COMPARISON:  04/15/2021; correlation CT chest on 03/24 FINDINGS: LEFT subclavian sequential transvenous pacemaker leads project at RIGHT atrium and RIGHT ventricle. Normal heart size, mediastinal contours, and pulmonary vascularity. Emphysematous and bronchitic changes consistent with COPD. Artifact from a button projects over the RIGHT upper lobe. Mild biapical scarring. No acute infiltrate, pleural effusion, or pneumothorax. Osseous structures unremarkable. IMPRESSION: COPD changes without acute abnormalities. Emphysema (ICD10-J43.9). Electronically Signed   By: Lavonia Dana M.D.   On: 03/31/2022 11:55          No data to display          No results found for: "NITRICOXIDE"      Assessment & Plan:   Acute bronchitis with COPD ([place]) Possible unresolved AECOPD/bronchitic like illness. Non-toxic and VS stable on room air. Neb treatment in office with some improvement in airflow. He had a few episodes of hemoptysis that seem to have resolved with doxycycline course. No anticoagulation. Will check CBC today. CXR today without acute process. Since he has persistent symptoms, we will obtain STAT CTA to rule out PE, acute infectious process, or suspicious pulmonary nodules/masses given his smoking history. If no acute process is identified, will hold off on further antimicrobial therapy, collect sputum cultures, and treat him with steroid taper. Optimize bronchodilator/mucociliary clearance regimens. Push fluids. Strict return/ED precautions. Action plan in place.   Patient Instructions  Continue Albuterol inhaler 2 puffs or 3 mL neb every 6 hours as  needed for shortness of breath or wheezing. Use 2-4 times a day until symptoms improve Continue Symbicort 2 puffs Twice daily. Brush tongue and rinse mouth afterwards Continue Spiriva 2 puffs daily Continue flonase nasal spray 2 sprays each nostril daily  Labs today  Collect sputum cultures and return   CT chest ordered of your chest to rule out a blood clot and assess your nodule in your lung  Follow up in 7-10 days with Dr. Melvyn Novas or Alanson Aly. If symptoms do not improve or worsen, please contact office for sooner follow up or seek emergency care.    Hemoptysis See above.   Cigarette smoker Active, heavy smoker.  7.5 mm nodule on LDCT chest from January 2024 with plans to repeat in 6 months. See above plan.  I spent 45 minutes of dedicated to the care of this patient on the date of this encounter to include pre-visit review of records, face-to-face time with the patient discussing conditions above, post visit ordering of testing, clinical documentation with the electronic health record, making appropriate referrals as documented, and communicating necessary findings to members of the patients care team.  Clayton Bibles, NP 04/01/2022  Pt aware and understands NP's role.

## 2022-03-31 NOTE — Patient Instructions (Addendum)
Continue Albuterol inhaler 2 puffs or 3 mL neb every 6 hours as needed for shortness of breath or wheezing. Use 2-4 times a day until symptoms improve Continue Symbicort 2 puffs Twice daily. Brush tongue and rinse mouth afterwards Continue Spiriva 2 puffs daily Continue flonase nasal spray 2 sprays each nostril daily  Labs today  Collect sputum cultures and return   CT chest ordered of your chest to rule out a blood clot and assess your nodule in your lung  Follow up in 7-10 days with Dr. Melvyn Novas or Alanson Aly. If symptoms do not improve or worsen, please contact office for sooner follow up or seek emergency care.

## 2022-03-31 NOTE — Progress Notes (Signed)
[name], can you please call [name] radiology and have them follow up on the 7.5 mm nodular density that was identified on his LDCT from 01/2022? I do not see any mention of this on the current read and want to ensure it hasn't increased in size. Thanks.

## 2022-03-31 NOTE — Progress Notes (Signed)
[facility] and spoke with Shauna Hugh, advised that the provider saw no mention of the 7.5 mm nodular density that was identified on his LDCT from 01/2022 on his CT Angio that was read today.  Diane stated she would send a message to Dr. [name] to do a read comparing the LDCT from 01/2022 to the CT angio that was done today that did not mention the nodular density.  Joellen Jersey is concerned about it increasing in size.  Diane stated we should see an addendum shortly.

## 2022-04-01 ENCOUNTER — Other Ambulatory Visit: Payer: Medicare HMO

## 2022-04-01 ENCOUNTER — Other Ambulatory Visit: Payer: Self-pay | Admitting: Nurse Practitioner

## 2022-04-01 ENCOUNTER — Encounter: Payer: Self-pay | Admitting: Nurse Practitioner

## 2022-04-01 DIAGNOSIS — J209 Acute bronchitis, unspecified: Secondary | ICD-10-CM

## 2022-04-01 DIAGNOSIS — J441 Chronic obstructive pulmonary disease with (acute) exacerbation: Secondary | ICD-10-CM | POA: Diagnosis not present

## 2022-04-01 DIAGNOSIS — R042 Hemoptysis: Secondary | ICD-10-CM | POA: Insufficient documentation

## 2022-04-01 DIAGNOSIS — J449 Chronic obstructive pulmonary disease, unspecified: Secondary | ICD-10-CM | POA: Insufficient documentation

## 2022-04-01 MED ORDER — PROMETHAZINE-DM 6.25-15 MG/5ML PO SYRP: 5.0000 mL | ORAL_SOLUTION | Freq: Four times a day (QID) | ORAL | 0 refills | Status: DC | PRN

## 2022-04-01 MED ORDER — PREDNISONE 10 MG PO TABS: ORAL_TABLET | ORAL | 0 refills | Status: DC

## 2022-04-01 MED ORDER — BENZONATATE 200 MG PO CAPS: 200.0000 mg | ORAL_CAPSULE | Freq: Three times a day (TID) | ORAL | 1 refills | Status: DC | PRN

## 2022-04-01 NOTE — Assessment & Plan Note (Signed)
Active, heavy smoker. 7.5 mm nodule on LDCT chest from January 2024 with plans to repeat in 6 months. See above plan.

## 2022-04-01 NOTE — Progress Notes (Signed)
ATC pt mother - LVM for her to call office back

## 2022-04-01 NOTE — Progress Notes (Signed)
CTA negative for PE and pneumonia. The nodule in his RUL is not well visualized but there are no large nodules/masses that would explain his hemoptysis. He should keep his next LDCT for better assessment of this area with the lung cancer screening program. I am going to treat him for AECOPD with steroid taper. Take in AM with food. I have also sent in tessalon perles and a cough syrup for him to use to help suppress his cough and limit further inflammation/irritation. Do not drive after taking cough syrup. May cause drowsiness.  He has been adequately covered from an antimicrobial standpoint. Possible that he had a small infection that is clearing with the doxy since his hemoptysis had resolved once I saw him. He should monitor his symptoms closely and go to the ED if hemoptysis returns over the weekend.

## 2022-04-01 NOTE — Assessment & Plan Note (Addendum)
Possible unresolved AECOPD/bronchitic like illness. Non-toxic and VS stable on room air. Neb treatment in office with some improvement in airflow. He had a few episodes of hemoptysis that seem to have resolved with doxycycline course. No anticoagulation. Will check CBC today. CXR today without acute process. Since he has persistent symptoms, we will obtain STAT CTA to rule out PE, acute infectious process, or suspicious pulmonary nodules/masses given his smoking history. If no acute process is identified, will hold off on further antimicrobial therapy, collect sputum cultures, and treat him with steroid taper. Optimize bronchodilator/mucociliary clearance regimens. Push fluids. Strict return/ED precautions. Action plan in place.   Patient Instructions  Continue Albuterol inhaler 2 puffs or 3 mL neb every 6 hours as needed for shortness of breath or wheezing. Use 2-4 times a day until symptoms improve Continue Symbicort 2 puffs Twice daily. Brush tongue and rinse mouth afterwards Continue Spiriva 2 puffs daily Continue flonase nasal spray 2 sprays each nostril daily  Labs today  Collect sputum cultures and return   CT chest ordered of your chest to rule out a blood clot and assess your nodule in your lung  Follow up in 7-10 days with Dr. Melvyn Novas or Alanson Aly. If symptoms do not improve or worsen, please contact office for sooner follow up or seek emergency care.

## 2022-04-01 NOTE — Assessment & Plan Note (Signed)
See above

## 2022-04-05 LAB — RESPIRATORY CULTURE OR RESPIRATORY AND SPUTUM CULTURE
MICRO NUMBER:: 14760621
RESULT:: NORMAL
SPECIMEN QUALITY:: ADEQUATE

## 2022-04-13 ENCOUNTER — Ambulatory Visit: Payer: Medicare HMO | Admitting: Internal Medicine

## 2022-04-14 ENCOUNTER — Encounter: Payer: Self-pay | Admitting: Nurse Practitioner

## 2022-04-14 ENCOUNTER — Ambulatory Visit: Payer: Medicare HMO | Admitting: Nurse Practitioner

## 2022-04-14 VITALS — BP 110/70 | HR 69 | Ht 69.0 in | Wt 126.2 lb

## 2022-04-14 DIAGNOSIS — F17210 Nicotine dependence, cigarettes, uncomplicated: Secondary | ICD-10-CM

## 2022-04-14 DIAGNOSIS — J441 Chronic obstructive pulmonary disease with (acute) exacerbation: Secondary | ICD-10-CM

## 2022-04-14 DIAGNOSIS — R634 Abnormal weight loss: Secondary | ICD-10-CM | POA: Diagnosis not present

## 2022-04-14 MED ORDER — PREDNISONE 10 MG PO TABS: ORAL_TABLET | ORAL | 0 refills | Status: DC

## 2022-04-14 MED ORDER — PREDNISONE 10 MG PO TABS: 10.0000 mg | ORAL_TABLET | Freq: Every day | ORAL | 1 refills | Status: DC

## 2022-04-14 NOTE — Patient Instructions (Addendum)
Continue Albuterol inhaler 2 puffs or 3 mL neb every 6 hours as needed for shortness of breath or wheezing. Use at least 2 times a day until symptoms improve Continue Symbicort 2 puffs Twice daily. Brush tongue and rinse mouth afterwards Continue Spiriva 2 puffs daily Continue flonase nasal spray 2 sprays each nostril daily  Prednisone taper. 4 tabs for 3 days, then 3 tabs for 3 days, 2 tabs for 3 days, then 1 tab (10 mg) daily. Take in AM with food Guaifenesin 600 mg Twice daily over the counter as needed for cough/congestion  Referral to GI  Work on small, frequent, high protein meals    Follow up in 6 weeks with [name] (1st) or Katie Jalynn Waddell,NP. If symptoms do not improve or worsen, please contact office for sooner follow up or seek emergency care.

## 2022-04-14 NOTE — Progress Notes (Signed)
@Patient  ID: Michael [name], male    DOB: 1960-11-05, 62 y.o.   MRN: [id number]  Chief Complaint  Patient presents with   Follow-up    Not eating, SOB, Wheezing, Cough.    Referring provider: [name], MD  HPI: 62 year old male, active smoker followed for COPD mixed type. He is a patient of Dr. Thurston Hole and last seen in office 03/31/2022 by [facility] NP. He is also followed by the lung cancer screening program. Past medical history significant for PAF, sinus node dysfunction s/p pacemaker, HTN, GERD, hypothyroid, PTSD, anxiety, depression, chronic pain syndrome.   TEST/EVENTS:  12/08/2015 spirometry: FVC 87, FEV1 46, ratio 40 01/05/2022 LDCT chest: Atherosclerosis.  Ascending thoracic 4 cm aneurysm.  Severe centrilobular and paraseptal emphysema.  Postinflammatory/infectious confluent scarring with architectural distortion and volume loss.  Tiny nodule in the right upper lobe, 4 mm.  Right upper lobe nodular density with surrounding scarring measuring 7.5 mm.  Lung RADS 3, probably benign. 03/31/2022 CTA chest: No PE. 4 cm ascending thoracic aneurysm, stable.  No LAD.  Emphysematous disease in upper lobes.  Stable scarring in the left upper lobe.  Previously visualized 7.5 mm nodule not well-visualized on current exam.    10/13/2021: OV with [name]. Maintained on Symbicort/Spiriva. Clear mucus in AM. Takes several hours to clear it. Referred to lung cancer screening program. Recounseled on smoking cessation.   03/31/2022: OV with [name] for acute visit with his mom. They tell me that over the past 3 weeks, he has been struggling with an increased productive cough and shortness of breath. He was treated by his PCP with azithromycin and prednisone with minimal change. He continued to have a productive cough with brown phlegm. He was put on doxycycline, which he completed today. He still hasn't noticed much of a difference in his symptoms. He is still producing sputum, feeling more short winded, and  having chest congestion. He also has had a few episodes of hemoptysis. Last occurrence was 3 days ago with a large amount of bright red blood. He takes a baby aspirin but no anticoagulation therapy. His appetite is decreased and he feels very fatigued. He has noticed some more wheezing. He denies fevers, chills, calf pain, leg swelling, orthopnea. He is using his Symbicort and Spiriva. Only uses his nebs if he feels like he really needs it. Hasn't in a few days. He's not taking any mucinex. He did reach out to cardiology as they weren't sure if something was wrong with his pacemaker. They were assured that it seemed to be functioning appropriately.   04/14/2022: Today - follow up Patient presents today for follow up with his mom. He was feeling better when he was on the steroids and abx but since coming off, he's started having more trouble with his breathing again. Feels shorter and he's having more trouble with walking longer distances. His cough has improved; decreased frequency and sputum is now clear to white. No more episodes of hemoptysis. Denies fevers, chills, leg swelling, orthopnea, night sweats. Not using his nebs. He is still having trouble with his appetite. His mom tells me that he's not eating much. He says he feels nauseous every time he eats and sometimes will vomit. Will occasionally drink a boost. He has lost about 6 pounds since he was here last, 3/28. Denies any changes in bowel habits or bloody stools. He occasionally has trouble swallowing but no pain. No mouth sores/lesions. This has been a concern for a  few months now. CT chest without evidence of malignancy.    Allergies  Allergen Reactions   Shellfish Allergy Anaphylaxis    Immunization History  Administered Date(s) Administered   COVID-19, mRNA, vaccine(Comirnaty)12 years and older 10/25/2021   Influenza Whole 09/13/2021   Influenza, High Dose Seasonal PF 10/11/2017   Influenza, Quadrivalent, Recombinant, Inj, Pf  11/02/2018   Influenza,inj,Quad PF,6+ Mos 09/02/2013, 08/25/2015, 10/29/2016, 10/06/2020   Influenza,inj,quad, With Preservative 11/02/2018   Influenza-Unspecified 09/23/2019   PFIZER Comirnaty(Gray Top)Covid-19 Tri-Sucrose Vaccine 04/28/2020   PFIZER(Purple Top)SARS-COV-2 Vaccination 03/21/2019, 04/15/2019, 11/02/2019   Pfizer Covid-19 Vaccine Bivalent Booster 3448yrs & up 10/21/2020   Pneumococcal Polysaccharide-23 06/15/2013   Respiratory Syncytial Virus Vaccine,Recomb Aduvanted(Arexvy) 10/13/2021   Tdap 07/09/2013, 12/10/2019, 10/01/2021   Zoster Recombinat (Shingrix) 09/26/2019, 12/10/2019    Past Medical History:  Diagnosis Date   Allergy    Anginal pain    Anxiety    Arthritis    Bradycardia    CHF (congestive heart failure)    Chronic chest pain    COPD (chronic obstructive pulmonary disease)    Depression    Emphysema of lung    GERD (gastroesophageal reflux disease)    History of nuclear stress test 06/14/2011   lexiscan; mild perfusion defect in basal inferosetpal & apical inferior region; negative for ischemia    Hypothyroidism    Pacemaker    Pneumonia    Presence of permanent cardiac pacemaker April 2005   For bradycardia   Seizure disorder    Seizures    as a baby   Shortness of breath    Tobacco abuse     Tobacco History: Social History   Tobacco Use  Smoking Status Every Day   Packs/day: 1.00   Years: 44.00   Additional pack years: 0.00   Total pack years: 44.00   Types: Cigarettes  Smokeless Tobacco Never  Tobacco Comments   Smoking everyday 04/14/2022 [name]   Ready to quit: Not Answered Counseling given: Not Answered Tobacco comments: Smoking everyday 04/14/2022 [name]   Outpatient Medications Prior to Visit  Medication Sig Dispense Refill   albuterol (ACCUNEB) 1.25 MG/3ML nebulizer solution 2.5 mg every 4 hours as needed for cough, shortness of breath and/or wheezing 75 mL 3   aspirin EC 81 MG tablet Take 81 mg by mouth daily. Swallow whole.      benzonatate (TESSALON) 200 MG capsule Take 1 capsule (200 mg total) by mouth 3 (three) times daily as needed. 30 capsule 1   Blood Pressure KIT Use daily as needed to check blood pressure 1 kit 0   doxycycline (VIBRA-TABS) 100 MG tablet Take 1 tablet (100 mg total) by mouth 2 (two) times daily. (Patient not taking: Reported on 04/14/2022) 14 tablet 0   EPINEPHrine 0.3 mg/0.3 mL IJ SOAJ injection Inject 0.3 mg into the muscle as needed for anaphylaxis. 1 each 1   FLUoxetine (PROZAC) 40 MG capsule Take 2 capsules (80 mg total) by mouth daily. 180 capsule 3   fluticasone (FLONASE) 50 MCG/ACT nasal spray Place 2 sprays into both nostrils as needed. 16 g 11   omeprazole (PRILOSEC) 20 MG capsule Take 1 capsule (20 mg total) by mouth daily. 90 capsule 3   ondansetron (ZOFRAN) 4 MG tablet Take 1 tablet (4 mg total) by mouth every 8 (eight) hours as needed for nausea or vomiting. 20 tablet 0   promethazine-dextromethorphan (PROMETHAZINE-DM) 6.25-15 MG/5ML syrup Take 5 mLs by mouth 4 (four) times daily as needed for cough. 180 mL 0  SYMBICORT 160-4.5 MCG/ACT inhaler Take 2 puffs first thing in am and then another 2 puffs about 12 hours later. 30.6 g 3   Tiotropium Bromide Monohydrate (SPIRIVA RESPIMAT) 2.5 MCG/ACT AERS Inhale 2 puffs into the lungs daily. 12 g 3   VENTOLIN HFA 108 (90 Base) MCG/ACT inhaler INHALE UP TO  2 PUFFS BY MOUTH EVERY 4 HOURS AS NEEDED 54 g 3   Vitamin D, Ergocalciferol, (DRISDOL) 1.25 MG (50000 UNIT) CAPS capsule Take 1 capsule (50,000 Units total) by mouth every Sunday. 12 capsule 3   azithromycin (ZITHROMAX) 250 MG tablet Take 2 tabs day 1, then 1 tab daily (Patient not taking: Reported on 04/14/2022) 6 each 0   predniSONE (DELTASONE) 10 MG tablet 4 tabs for 2 days, then 3 tabs for 2 days, 2 tabs for 2 days, then 1 tab for 2 days, then stop (Patient not taking: Reported on 04/14/2022) 20 tablet 0   No facility-administered medications prior to visit.     Review of Systems:    Constitutional: No weight loss or gain, night sweats, fevers, chills. +fatigue, lassitude. HEENT: No headaches, tooth/dental problems, or sore throat. No sneezing, itching, ear ache, nasal congestion, or post nasal drip. +difficulty swallowing  CV:  No chest pain, orthopnea, PND, swelling in lower extremities, anasarca, dizziness, palpitations, syncope Resp: +shortness of breath with exertion; improved cough; occasional wheezing. No hemoptysis. No chest wall deformity GI:  +decreased appetite; nausea. No heartburn, indigestion, abdominal pain, diarrhea, change in bowel habits, bloody stools.  GU: No dysuria, change in color of urine, urgency or frequency.  Skin: No rash, lesions, ulcerations MSK:  No joint pain or swelling.   Neuro: No dizziness or lightheadedness.  Psych: No depression or anxiety. Mood stable.     Physical Exam:  BP 110/70 (BP Location: Left Arm)   Pulse 69   Ht 5\' 9"  (1.753 m)   Wt 126 lb 3.2 oz (57.2 kg)   SpO2 98%   BMI 18.64 kg/m   GEN: Pleasant, interactive, well-kempt; non-toxic and in no acute distress. HEENT:  Normocephalic and atraumatic. PERRLA. Sclera white. Nasal turbinates pink, moist and patent bilaterally. No rhinorrhea present. Oropharynx pink and moist, without exudate or edema. No lesions, ulcerations, or postnasal drip.  NECK:  Supple w/ fair ROM. No JVD present. Normal carotid impulses w/o bruits. Thyroid symmetrical with no goiter or nodules palpated. No lymphadenopathy.   CV: RRR, no m/r/g, no peripheral edema. Pulses intact, +2 bilaterally. No cyanosis, pallor or clubbing. PULMONARY:  Unlabored, regular breathing. Mild end expiratory wheezes b/l A&P. No accessory muscle use.  GI: BS present and normoactive. Soft, non-tender to palpation. No organomegaly or masses detected.  MSK: No erythema, warmth or tenderness. Cap refil <2 sec all extrem. No deformities or joint swelling noted. Muscle wasting Neuro: A/Ox3. No focal deficits noted.   Skin:  Warm, no lesions or rashe Psych: Normal affect and behavior. Judgement and thought content appropriate.     Lab Results:  CBC    Component Value Date/Time   WBC 7.8 03/31/2022 1223   RBC 4.06 (L) 03/31/2022 1223   HGB 13.6 03/31/2022 1223   HCT 39.9 03/31/2022 1223   PLT 255.0 03/31/2022 1223   MCV 98.1 03/31/2022 1223   MCH 33.3 10/02/2021 0429   MCHC 34.2 03/31/2022 1223   RDW 13.4 03/31/2022 1223   LYMPHSABS 1.8 03/31/2022 1223   MONOABS 0.7 03/31/2022 1223   EOSABS 0.2 03/31/2022 1223   BASOSABS 0.1 03/31/2022 1223    BMET  Component Value Date/Time   NA 136 03/31/2022 1223   K 4.2 03/31/2022 1223   CL 102 03/31/2022 1223   CO2 30 03/31/2022 1223   GLUCOSE 75 03/31/2022 1223   BUN 19 03/31/2022 1223   CREATININE 0.86 03/31/2022 1223   CREATININE 1.03 07/28/2015 1202   CALCIUM 9.5 03/31/2022 1223   GFRNONAA >60 10/02/2021 0429   GFRNONAA 82 07/28/2015 1202   GFRAA >60 03/18/2017 0254   GFRAA >89 07/28/2015 1202    BNP No results found for: "BNP"   Imaging:  CT Angio Chest Pulmonary Embolism (PE) W or WO Contrast  Addendum Date: 03/31/2022   ADDENDUM REPORT: 03/31/2022 16:16 ADDENDUM: Nodule identified in right upper lobe on prior exam not well visualized currently, perhaps due to the fact that thin-section imaging was not utilized. Short-term follow-up in 6 months is recommended with repeat low-dose chest CT without contrast as recommended on prior exam. Electronically Signed   By: Lupita Raider M.D.   On: 03/31/2022 16:16   Result Date: 03/31/2022 CLINICAL DATA:  Cough, hemoptysis. EXAM: CT ANGIOGRAPHY CHEST WITH CONTRAST TECHNIQUE: Multidetector CT imaging of the chest was performed using the standard protocol during bolus administration of intravenous contrast. Multiplanar CT image reconstructions and MIPs were obtained to evaluate the vascular anatomy. RADIATION DOSE REDUCTION: This exam was performed according to the departmental dose-optimization  program which includes automated exposure control, adjustment of the mA and/or kV according to patient size and/or use of iterative reconstruction technique. CONTRAST:  75mL OMNIPAQUE IOHEXOL 350 MG/ML SOLN COMPARISON:  January 05, 2022. FINDINGS: Cardiovascular: Satisfactory opacification of the pulmonary arteries to the segmental level. No evidence of pulmonary embolism. Normal heart size. No pericardial effusion. 4.0 cm ascending thoracic aortic aneurysm is noted. Mediastinum/Nodes: No enlarged mediastinal, hilar, or axillary lymph nodes. Thyroid gland, trachea, and esophagus demonstrate no significant findings. Lungs/Pleura: No pneumothorax or pleural effusion is noted. Emphysematous disease is noted predominantly in the upper lobes. Stable scarring is noted anteriorly in the left upper lobe. Upper Abdomen: No acute abnormality. Musculoskeletal: No chest wall abnormality. No acute or significant osseous findings. Review of the MIP images confirms the above findings. IMPRESSION: No definite evidence of pulmonary embolus. 4 cm ascending thoracic aortic aneurysm. Recommend annual imaging followup by CTA or MRA. This recommendation follows 2010 ACCF/AHA/AATS/ACR/ASA/SCA/SCAI/SIR/STS/SVM Guidelines for the Diagnosis and Management of Patients with Thoracic Aortic Disease. Circulation. 2010; 121: Z610-R604. Aortic aneurysm NOS (ICD10-I71.9). Stable probable scarring is seen in the anterior portion of left upper lobe. Given the history of tobacco use, attention to this abnormality on follow-up imaging is recommended to ensure stability. Aortic Atherosclerosis (ICD10-I70.0) and Emphysema (ICD10-J43.9). Electronically Signed: By: Lupita Raider M.D. On: 03/31/2022 14:17   DG Chest 2 View  Result Date: 03/31/2022 CLINICAL DATA:  Acute exacerbation of COPD, persistent cough despite antibiotics EXAM: CHEST - 2 VIEW COMPARISON:  04/15/2021; correlation CT chest on 03/24 FINDINGS: LEFT subclavian sequential transvenous  pacemaker leads project at RIGHT atrium and RIGHT ventricle. Normal heart size, mediastinal contours, and pulmonary vascularity. Emphysematous and bronchitic changes consistent with COPD. Artifact from a button projects over the RIGHT upper lobe. Mild biapical scarring. No acute infiltrate, pleural effusion, or pneumothorax. Osseous structures unremarkable. IMPRESSION: COPD changes without acute abnormalities. Emphysema (ICD10-J43.9). Electronically Signed   By: Ulyses Southward M.D.   On: 03/31/2022 11:55          No data to display          No results found for: "NITRICOXIDE"  Assessment & Plan:   COPD with acute exacerbation Severe COPD with recurrent exacerbation after completing previous steroid course. Infectious symptoms have improved. No evidence of acute process on CTA chest. Sputum culture with normal flora. We will restart steroid course. Hold off on further antimicrobial therapy at this point given improvement. Maximize bronchodilator regimen. Action plan in place.  Patient Instructions  Continue Albuterol inhaler 2 puffs or 3 mL neb every 6 hours as needed for shortness of breath or wheezing. Use at least 2 times a day until symptoms improve Continue Symbicort 2 puffs Twice daily. Brush tongue and rinse mouth afterwards Continue Spiriva 2 puffs daily Continue flonase nasal spray 2 sprays each nostril daily  Prednisone taper. 4 tabs for 3 days, then 3 tabs for 3 days, 2 tabs for 3 days, then 1 tab (10 mg) daily. Take in AM with food Guaifenesin 600 mg Twice daily over the counter as needed for cough/congestion  Referral to GI  Work on small, frequent, high protein meals    Follow up in 6 weeks with [name] (1st) or Katie Latessa Tillis,NP. If symptoms do not improve or worsen, please contact office for sooner follow up or seek emergency care.    Weight loss Unintentional weight loss r/t anorexia. Unclear if this is related to AECOPD given length of time. I am going to send him  to GI for further workup/eval. Encouraged him to focus on high protein diet with small, frequent meals and monitor weights at home.   Cigarette smoker No findings concerning for lung malignancy on recent CTA chest. He will continue LDCT with lung cancer screening program. Smoking cessation strongly advised.    I spent 45 minutes of dedicated to the care of this patient on the date of this encounter to include pre-visit review of records, face-to-face time with the patient discussing conditions above, post visit ordering of testing, clinical documentation with the electronic health record, making appropriate referrals as documented, and communicating necessary findings to members of the patients care team.  Noemi Chapel, NP 04/15/2022  Pt aware and understands NP's role.

## 2022-04-15 ENCOUNTER — Encounter: Payer: Self-pay | Admitting: Nurse Practitioner

## 2022-04-15 NOTE — Assessment & Plan Note (Signed)
Severe COPD with recurrent exacerbation after completing previous steroid course. Infectious symptoms have improved. No evidence of acute process on CTA chest. Sputum culture with normal flora. We will restart steroid course. Hold off on further antimicrobial therapy at this point given improvement. Maximize bronchodilator regimen. Action plan in place.  Patient Instructions  Continue Albuterol inhaler 2 puffs or 3 mL neb every 6 hours as needed for shortness of breath or wheezing. Use at least 2 times a day until symptoms improve Continue Symbicort 2 puffs Twice daily. Brush tongue and rinse mouth afterwards Continue Spiriva 2 puffs daily Continue flonase nasal spray 2 sprays each nostril daily  Prednisone taper. 4 tabs for 3 days, then 3 tabs for 3 days, 2 tabs for 3 days, then 1 tab (10 mg) daily. Take in AM with food Guaifenesin 600 mg Twice daily over the counter as needed for cough/congestion  Referral to GI  Work on small, frequent, high protein meals    Follow up in 6 weeks with [name] (1st) or Katie Chundra Sauerwein,NP. If symptoms do not improve or worsen, please contact office for sooner follow up or seek emergency care.

## 2022-04-15 NOTE — Assessment & Plan Note (Addendum)
Unintentional weight loss r/t anorexia. Unclear if this is related to AECOPD given length of time. I am going to send him to GI for further workup/eval. Encouraged him to focus on high protein diet with small, frequent meals and monitor weights at home.

## 2022-04-15 NOTE — Assessment & Plan Note (Signed)
No findings concerning for lung malignancy on recent CTA chest. He will continue LDCT with lung cancer screening program. Smoking cessation strongly advised.

## 2022-04-16 ENCOUNTER — Ambulatory Visit
Admission: EM | Admit: 2022-04-16 | Discharge: 2022-04-16 | Disposition: A | Discharge status: Home / Self Care | Payer: Medicare HMO | Attending: Nurse Practitioner | Admitting: Nurse Practitioner

## 2022-04-16 DIAGNOSIS — S61214A Laceration without foreign body of right ring finger without damage to nail, initial encounter: Secondary | ICD-10-CM

## 2022-04-16 NOTE — Discharge Instructions (Signed)
4 sutures were placed to the right ring finger. Keep the dressing in place for 24 hours. Remove the dressing and clean the area with warm water in 24 hours.  When you are at home, may leave the area open to air.  When you are out, recommend keeping the area covered. May apply Neosporin to the lacerations as needed. May take over-the-counter ibuprofen or Tylenol for pain or discomfort. Follow-up immediately if you develop swelling, increased redness that goes into the hand or up the arm, drainage, or if you develop fever, chills, or other concerns. Keep the sutures in place for 10 days.  You will follow-up on 04/26/22 for suture removal. Follow-up as needed.

## 2022-04-16 NOTE — ED Triage Notes (Signed)
Pt reports he cut his right ring finger on an oil can about 45 min ago. Reports it is painful.

## 2022-04-16 NOTE — ED Provider Notes (Signed)
[facility]    CSN: [id number] Arrival date & time: 04/16/22  1457      History   Chief Complaint No chief complaint on file.   HPI Michael [name] is a 62 y.o. male.   The history is provided by the patient.   The patient presents with a laceration to the right ring finger.  Patient states he cut his finger on an old can approximately 45 minutes of prior to arrival.  Patient denies numbness, tingling, or radiation of pain, but states that he does have pain in the right ring finger.  Patient states his last tetanus shot was approximately 1 to 2 years ago.  Patient states that he currently takes a baby aspirin daily.  Past Medical History:  Diagnosis Date   Allergy    Anginal pain    Anxiety    Arthritis    Bradycardia    CHF (congestive heart failure)    Chronic chest pain    COPD (chronic obstructive pulmonary disease)    Depression    Emphysema of lung    GERD (gastroesophageal reflux disease)    History of nuclear stress test 06/14/2011   lexiscan; mild perfusion defect in basal inferosetpal & apical inferior region; negative for ischemia    Hypothyroidism    Pacemaker    Pneumonia    Presence of permanent cardiac pacemaker April 2005   For bradycardia   Seizure disorder    Seizures    as a baby   Shortness of breath    Tobacco abuse     Patient Active Problem List   Diagnosis Date Noted   COPD with acute exacerbation 04/01/2022   Hemoptysis 04/01/2022   Senile purpura 10/12/2021   Osteoarthritis 04/13/2020   Chronic pain syndrome 04/13/2020   Vitamin D deficiency 11/11/2019   History of cardiac pacemaker in situ 11/11/2019   Asthma 11/11/2019   Seizure disorder 03/17/2017   Hypothyroidism 03/17/2017   GERD (gastroesophageal reflux disease) 03/17/2017   Anxiety 03/17/2017   Depression 03/17/2017   HTN (hypertension) 03/17/2017   Cigarette smoker 04/13/2016   PTSD (post-traumatic stress disorder) 01/27/2016   Chronic respiratory failure with  hypercapnia 12/09/2015   Paroxysmal atrial fibrillation 10/22/2013   Sinus node dysfunction 10/22/2013   COPD GOLD III  07/08/2013   Weight loss 09/14/2012   Symptomatic bradycardia 09/06/2012   Pacemaker - Medtronic 2005, generator change September 2014 09/06/2012    Past Surgical History:  Procedure Laterality Date   CARDIAC CATHETERIZATION  2002, 2003   normal coronaries   EYE SURGERY  1/62 y.o.   GALLBLADDER SURGERY  2003   HAND SURGERY  1998   x3   OPEN REDUCTION INTERNAL FIXATION (ORIF) DISTAL RADIAL FRACTURE Right 09/07/2016   Procedure: RIGHT WRIST OPEN REDUCTION INTERNAL FIXATION (ORIF) REPAIR AS INDICATED;  Surgeon: Bradly Bienenstock, MD;  Location: [facility] OR;  Service: Orthopedics;  Laterality: Right;   PACEMAKER INSERTION  04/23/2003   Medtronic Kappa; [facility] - symptomatic bradycardia   PERMANENT PACEMAKER GENERATOR CHANGE N/A 09/06/2012   Procedure: PERMANENT PACEMAKER GENERATOR CHANGE;  Surgeon: Thurmon Fair, MD;  Location: [facility] CATH LAB;  Service: Cardiovascular;  Laterality: N/A;   TONSILLECTOMY     TRANSTHORACIC ECHOCARDIOGRAM  05/14/2012   EF 50-55%, normal systolic function; mild MR (ordered for mitral valve disease 424.0)       Home Medications    Prior to Admission medications   Medication Sig Start Date End Date Taking? Authorizing Provider  doxycycline (  VIBRA-TABS) 100 MG tablet Take 1 tablet (100 mg total) by mouth 2 (two) times daily. Patient not taking: Reported on 04/14/2022 03/25/22   [name], MD  albuterol (ACCUNEB) 1.25 MG/3ML nebulizer solution 2.5 mg every 4 hours as needed for cough, shortness of breath and/or wheezing 03/31/22   Noemi Chapel, NP  aspirin EC 81 MG tablet Take 81 mg by mouth daily. Swallow whole.    [provider]  azithromycin (ZITHROMAX) 250 MG tablet Take 2 tabs day 1, then 1 tab daily Patient not taking: Reported on 04/14/2022 03/14/22   [name], MD  benzonatate (TESSALON) 200 MG capsule  Take 1 capsule (200 mg total) by mouth 3 (three) times daily as needed. 04/01/22   Noemi Chapel, NP  Blood Pressure KIT Use daily as needed to check blood pressure 10/12/21   [name], MD  EPINEPHrine 0.3 mg/0.3 mL IJ SOAJ injection Inject 0.3 mg into the muscle as needed for anaphylaxis. 10/26/21   [name], MD  FLUoxetine (PROZAC) 40 MG capsule Take 2 capsules (80 mg total) by mouth daily. 08/02/21   [name], MD  fluticasone [facility]) 50 MCG/ACT nasal spray Place 2 sprays into both nostrils as needed. 10/26/21   [name], MD  omeprazole (PRILOSEC) 20 MG capsule Take 1 capsule (20 mg total) by mouth daily. 10/02/21   Shon Hale, MD  ondansetron (ZOFRAN) 4 MG tablet Take 1 tablet (4 mg total) by mouth every 8 (eight) hours as needed for nausea or vomiting. 03/14/22   [name], MD  predniSONE (DELTASONE) 10 MG tablet 4 tabs for 3 days, then 3 tabs for 3 days, 2 tabs for 3 days, then 1 tab for 3 days, then stop 04/14/22   Cobb, Ruby Cola, NP  predniSONE (DELTASONE) 10 MG tablet Take 1 tablet (10 mg total) by mouth daily with breakfast. 04/25/22   Cobb, Ruby Cola, NP  promethazine-dextromethorphan (PROMETHAZINE-DM) 6.25-15 MG/5ML syrup Take 5 mLs by mouth 4 (four) times daily as needed for cough. 04/01/22   Cobb, Ruby Cola, NP  SYMBICORT 160-4.5 MCG/ACT inhaler Take 2 puffs first thing in am and then another 2 puffs about 12 hours later. 03/31/22   Cobb, Ruby Cola, NP  Tiotropium Bromide Monohydrate (SPIRIVA RESPIMAT) 2.5 MCG/ACT AERS Inhale 2 puffs into the lungs daily. 03/31/22   Cobb, Ruby Cola, NP  VENTOLIN HFA 108 (90 Base) MCG/ACT inhaler INHALE UP TO  2 PUFFS BY MOUTH EVERY 4 HOURS AS NEEDED 03/31/22   Cobb, Ruby Cola, NP  Vitamin D, Ergocalciferol, (DRISDOL) 1.25 MG (50000 UNIT) CAPS capsule Take 1 capsule (50,000 Units total) by mouth every Sunday. 10/03/21   Shon Hale, MD    Family History Family History  Problem Relation Age of Onset    Heart Problems Mother        MVP   Heart disease Mother    CAD Father    COPD Father    Heart disease Father    Other Sister        crib dealth   Heart attack Maternal Grandmother    Hypertension Maternal Grandmother    Heart attack Paternal Grandmother    Hypertension Paternal Grandmother    Heart attack Paternal Grandfather    Hypertension Paternal Grandfather    Heart Problems Other        aunts x 2 died of heart problems     Social History Social History   Tobacco Use  Smoking status: Every Day    Packs/day: 1.00    Years: 44.00    Additional pack years: 0.00    Total pack years: 44.00    Types: Cigarettes   Smokeless tobacco: Never   Tobacco comments:    Smoking everyday 04/14/2022 Dellis Filbert  Vaping Use   Vaping Use: Never used  Substance Use Topics   Alcohol use: No   Drug use: Never     Allergies   Shellfish allergy   Review of Systems Review of Systems Per HPI  Physical Exam Triage Vital Signs ED Triage Vitals  Enc Vitals Group     BP 04/16/22 1510 130/76     Pulse Rate 04/16/22 1510 85     Resp 04/16/22 1510 20     Temp 04/16/22 1510 97.7 F (36.5 C)     Temp Source 04/16/22 1510 Oral     SpO2 04/16/22 1510 95 %     Weight --      Height --      Head Circumference --      Peak Flow --      Pain Score 04/16/22 1507 5     Pain Loc --      Pain Edu? --      Excl. in GC? --    No data found.  Updated Vital Signs BP 130/76 (BP Location: Right Arm)   Pulse 85   Temp 97.7 F (36.5 C) (Oral)   Resp 20   SpO2 95%   Visual Acuity Right Eye Distance:   Left Eye Distance:   Bilateral Distance:    Right Eye Near:   Left Eye Near:    Bilateral Near:     Physical Exam Vitals and nursing note reviewed.  Constitutional:      Appearance: Normal appearance. He is not toxic-appearing.  HENT:     Head: Normocephalic.  Eyes:     Pupils: Pupils are equal, round, and reactive to light.  Pulmonary:     Effort: Pulmonary effort is normal.   Musculoskeletal:     Cervical back: Normal range of motion.  Skin:    General: Skin is warm and dry.     Findings: Laceration present.     Comments: Laceration noted to the palmar aspect of the right ring finger.  Laceration measures approximately 1 cm in length.  Laceration is noted to the proximal phalanges of the right ring finger.  Active bleeding at this time.  Bleeding was controlled with a pressure dressing.  Neurological:     General: No focal deficit present.     Mental Status: He is alert and oriented to person, place, and time.  Psychiatric:        Mood and Affect: Mood normal.        Behavior: Behavior normal.      UC Treatments / Results  Labs (all labs ordered are listed, but only abnormal results are displayed) Labs Reviewed - No data to display  EKG   Radiology No results found.  Procedures Laceration Repair  Date/Time: 04/16/2022 3:45 PM  Performed by: Abran Cantor, NP Authorized by: Abran Cantor, NP   Consent:    Consent obtained:  Verbal   Consent given by:  Patient   Risks discussed:  Infection, need for additional repair, poor wound healing and poor cosmetic result Universal protocol:    Procedure explained and questions answered to patient or proxy's satisfaction: yes     Patient identity confirmed:  Verbally with  patient Anesthesia:    Anesthesia method:  Local infiltration   Local anesthetic:  Lidocaine 2% w/o epi (3mL) Laceration details:    Location:  Finger   Finger location:  R ring finger Pre-procedure details:    Preparation:  Patient was prepped and draped in usual sterile fashion Exploration:    Contaminated: no   Treatment:    Wound cleansed with: HIbiclens and NS.   Irrigation method:  Tap   Debridement:  None Skin repair:    Repair method:  Sutures   Suture size:  3-0   Suture technique:  Simple interrupted   Number of sutures:  4 Approximation:    Approximation:  Loose Repair type:    Repair  type:  Simple Post-procedure details:    Dressing:  Antibiotic ointment and non-adherent dressing   Procedure completion:  Tolerated well, no immediate complications  (including critical care time)  Medications Ordered in UC Medications - No data to display  Initial Impression / Assessment and Plan / UC Course  I have reviewed the triage vital signs and the nursing notes.  Pertinent labs & imaging results that were available during my care of the patient were reviewed by me and considered in my medical decision making (see chart for details).  The patient is well-appearing, he is in no acute distress, vital signs are stable.  Patient with a laceration noted to the palmar aspect of the right ring finger.  Laceration was repaired with 4 simple interrupted sutures using 3 point 0 Ethilon.  Patient's tetanus shot was up-to-date as he received it over the past 1 to 2 years.  Topical antibiotic ointment was applied to the suture line with nonadherent dressing, gauze, and Coban.  Finger splint was also provided for protection.  Patient was advised that he should return to this clinic for removal of the sutures in 10 days.  Patient was given supportive care recommendations along with indications of when follow-up, specifically for signs of infection, would be indicated.  Patient is in agreement with this plan of care and verbalizes understanding.  All questions were answered.  Patient stable for discharge.  Final Clinical Impressions(s) / UC Diagnoses   Final diagnoses:  Laceration of right ring finger without foreign body without damage to nail, initial encounter     Discharge Instructions      4 sutures were placed to the right ring finger. Keep the dressing in place for 24 hours. Remove the dressing and clean the area with warm water in 24 hours.  When you are at home, may leave the area open to air.  When you are out, recommend keeping the area covered. May apply Neosporin to the  lacerations as needed. May take over-the-counter ibuprofen or Tylenol for pain or discomfort. Follow-up immediately if you develop swelling, increased redness that goes into the hand or up the arm, drainage, or if you develop fever, chills, or other concerns. Keep the sutures in place for 10 days.  You will follow-up on 04/26/22 for suture removal. Follow-up as needed.      ED Prescriptions   None    PDMP not reviewed this encounter.   Abran Cantor, NP 04/16/22 1629

## 2022-05-03 ENCOUNTER — Ambulatory Visit: Payer: Medicare HMO

## 2022-05-06 ENCOUNTER — Encounter: Payer: Self-pay | Admitting: Family Medicine

## 2022-05-06 ENCOUNTER — Ambulatory Visit (INDEPENDENT_AMBULATORY_CARE_PROVIDER_SITE_OTHER): Payer: Medicare HMO | Admitting: Family Medicine

## 2022-05-06 VITALS — BP 113/74 | HR 77 | Temp 97.8°F | Ht 69.0 in | Wt 127.8 lb

## 2022-05-06 DIAGNOSIS — R109 Unspecified abdominal pain: Secondary | ICD-10-CM

## 2022-05-06 DIAGNOSIS — K219 Gastro-esophageal reflux disease without esophagitis: Secondary | ICD-10-CM | POA: Diagnosis not present

## 2022-05-06 DIAGNOSIS — J45909 Unspecified asthma, uncomplicated: Secondary | ICD-10-CM

## 2022-05-06 DIAGNOSIS — J449 Chronic obstructive pulmonary disease, unspecified: Secondary | ICD-10-CM

## 2022-05-06 NOTE — Assessment & Plan Note (Signed)
On omeprazole 20 mg daily.  Concern for possible PUD as above.  Will be referring to GI as above.  If he cannot get into see GI soon would be reasonable to try switching to alternative PPI such as Protonix 40 mg daily.

## 2022-05-06 NOTE — Assessment & Plan Note (Signed)
Reassuring lung exam today.  Doing well status post his acute flare.  He will continue management per pulmonology.  Currently on Spiriva and albuterol.

## 2022-05-06 NOTE — Patient Instructions (Signed)
It was very nice to see you today!  We will refer you to see GI.  Please let us know if it will be a while before you can see them.  No medication changes today.   Take care, Dr Jimmey Ralph  PLEASE NOTE:  If you had any lab tests, please let us know if you have not heard back within a few days. You may see your results on mychart before we have a chance to review them but we will give you a call once they are reviewed by [place].   If we ordered any referrals today, please let us know if you have not heard from their office within the next week.   If you had any urgent prescriptions sent in today, please check with the pharmacy within an hour of our visit to make sure the prescription was transmitted appropriately.   Please try these tips to maintain a healthy lifestyle:  Eat at least 3 REAL meals and 1-2 snacks per day.  Aim for no more than 5 hours between eating.  If you eat breakfast, please do so within one hour of getting up.   Each meal should contain half fruits/vegetables, one quarter protein, and one quarter carbs (no bigger than a computer mouse)  Cut down on sweet beverages. This includes juice, soda, and sweet tea.   Drink at least 1 glass of water with each meal and aim for at least 8 glasses per day  Exercise at least 150 minutes every week.

## 2022-05-06 NOTE — Progress Notes (Signed)
   Michael [name] is a 62 y.o. male who presents today for an office visit.  Assessment/Plan:  New/Acute Problems: Postprandial Abdominal Pain Concern for possible PUD given that symptoms only really occur after eating.  He does have a few other worrisome findings such as early satiety and unintentional weight loss.  Reassuring exam today.  He is already on PPI.  Will place referral to gastroenterology for further evaluation.  He does have a history of cholecystectomy in the past -do not need to rule out biliary pathology at this point.  If he cannot get into see GI for several months would consider CT scan soon.  We discussed reasons to return to care and seek emergent care.  Chronic Problems Addressed Today: GERD (gastroesophageal reflux disease) On omeprazole 20 mg daily.  Concern for possible PUD as above.  Will be referring to GI as above.  If he cannot get into see GI soon would be reasonable to try switching to alternative PPI such as Protonix 40 mg daily.  COPD GOLD III  Reassuring lung exam today.  Doing well status post his acute flare.  He will continue management per pulmonology.  Currently on Spiriva and albuterol.     Subjective:  HPI:  Patient here today for follow up.  He was last seen here about 6 weeks ago.  Last time he was here he was having some issues with abdominal pain and bloating in setting of a COPD exacerbation.we treated for COPD with course of azithromycin and prednisone.  Ultimately saw pulmonology after our visit and was given another round of antibiotics and steroids.  This is much better today.  Still has had persistent abdominal pain and bloating.  This has been going on for months to years. Getting worse the last few months. Gets a lot of worse when eating. Gets full very quick. Some nausea. No vomiting. Some constipation and diarrhea. He had his gallbladder taken out about 20 years ago.        Objective:  Physical Exam: BP 113/74   Pulse 77   Temp 97.8 F  (36.6 C) (Temporal)   Ht 5\' 9"  (1.753 m)   Wt 127 lb 12.8 oz (58 kg)   SpO2 95%   BMI 18.87 kg/m   Wt Readings from Last 3 Encounters:  05/06/22 127 lb 12.8 oz (58 kg)  04/14/22 126 lb 3.2 oz (57.2 kg)  03/31/22 132 lb 3.2 oz (60 kg)    Gen: No acute distress, resting comfortably CV: Regular rate and rhythm with no murmurs appreciated Pulm: Normal work of breathing, clear to auscultation bilaterally with no crackles, wheezes, or rhonchi GI: Bowel sounds present, soft, nontender, nondistended.  No rebound or guarding. Neuro: Grossly normal, moves all extremities Psych: Normal affect and thought content      Cheril Slattery M. Jimmey Ralph, MD 05/06/2022 1:29 PM

## 2022-05-25 NOTE — Progress Notes (Signed)
Subjective:     Patient ID: Michael [name], male   DOB: 1961-01-03      MRN: [id number]    Brief patient profile:  76 yowm MM/active smoker referred to pulmonary clinic 12/08/2015 by Dr  Ilsa Iha with GOLD III copd documented 08/19/13     History of Present Illness  10/11/2017  f/u ov/[name] re: COPD III/ still smoking maint on symb/ spiriva dpi  Chief Complaint  Patient presents with   Follow-up    Breathing "depends on the heat outside"- he is unsure of meds and states "needs refills on everything".    Dyspnea:  MMRC3 = can't walk 100 yards even at a slow pace at a flat grade s stopping due to sob   Cough: provoked  with active smoking and ? From dpi spiriva also/ min mucoid  Sleeping: bed flat with a couple of pillows  SABA use: way too much when weather hot  02: none  rec Change spiriva to Respimat form  and use 2 pffs first thing in am only to follow the symbicort x 2 puffs only in the am, then take the other 2 pffs of symbicort 12 hours later  Work on inhaler technique:  The key is to stop smoking completely before smoking completely stops you!  Please schedule a follow up visit in 3 months but call sooner if needed  with all medications      10/13/2021  f/u ov/[name] office/Neita Landrigan re: GOLD 3  maint on symb/spiriva    Chief Complaint  Patient presents with   Follow-up    Feels breathing may be the same since last ov. Had a recent hospital admission 9/29-9/30   Dyspnea:  still doing grocery shopping until admitted  Cough: mucus clear in am / takes several hours to clear it. Sleeping: occ needs saba noct15 y  SABA use: not needing neb  02: none  Rec No change rx       05/26/2022  f/u ov/[place] office/[name] re: GOLD 3 copd maint on symbicort /spiriva/ pred 10 mg daily   still smoking Chief Complaint  Patient presents with   Follow-up    Pt f/u states that he is still wheezing in the morning and having a productive cough w/ ranging colors. Currently smoking 1ppd -  requesting refills on all inhalers along w/ flonase, omeprazole &   Dyspnea:  walking around property slow pace  Cough: none in the last week  prior to OV   Sleeping: couch propped up on arm and pillow SABA use: one or twice every night / neb once at night  02: none    Lung cancer screening: scheduled for 07/2022    No obvious day to day or daytime variability or assoc  purulent sputum or mucus plugs or hemoptysis or cp or chest tightness,   or overt sinus or hb symptoms.    Also denies any obvious fluctuation of symptoms with weather or environmental changes or other aggravating or alleviating factors except as outlined above   No unusual exposure hx or h/o childhood pna/ asthma or knowledge of premature birth.  Current Allergies, Complete Past Medical History, Past Surgical History, Family History, and Social History were reviewed in [employer] record.  ROS  The following are not active complaints unless bolded Hoarseness, sore throat, dysphagia, dental problems, itching, sneezing,  nasal congestion or discharge of excess mucus or purulent secretions, ear ache,   fever, chills, sweats, unintended wt loss or wt gain, classically  pleuritic or exertional cp,  orthopnea pnd or arm/hand swelling  or leg swelling, presyncope, palpitations, abdominal pain= bloating /belching on ppi , anorexia, nausea, vomiting, diarrhea  or change in bowel habits or change in bladder habits, change in stools or change in urine, dysuria, hematuria,  rash, arthralgias, visual complaints, headache, numbness, weakness or ataxia or problems with walking or coordination,  change in mood or  memory.        Current Meds  Medication Sig   albuterol (ACCUNEB) 1.25 MG/3ML nebulizer solution 2.5 mg every 4 hours as needed for cough, shortness of breath and/or wheezing   aspirin EC 81 MG tablet Take 81 mg by mouth daily. Swallow whole.   EPINEPHrine 0.3 mg/0.3 mL IJ SOAJ injection Inject 0.3 mg into  the muscle as needed for anaphylaxis.   FLUoxetine (PROZAC) 40 MG capsule Take 2 capsules (80 mg total) by mouth daily.   fluticasone (FLONASE) 50 MCG/ACT nasal spray Place 2 sprays into both nostrils as needed.   omeprazole (PRILOSEC) 20 MG capsule Take 1 capsule (20 mg total) by mouth daily.   ondansetron (ZOFRAN) 4 MG tablet Take 1 tablet (4 mg total) by mouth every 8 (eight) hours as needed for nausea or vomiting.   predniSONE (DELTASONE) 10 MG tablet Take 10 mg by mouth daily with breakfast.   SYMBICORT 160-4.5 MCG/ACT inhaler Take 2 puffs first thing in am and then another 2 puffs about 12 hours later.   Tiotropium Bromide Monohydrate (SPIRIVA RESPIMAT) 2.5 MCG/ACT AERS Inhale 2 puffs into the lungs daily.   VENTOLIN HFA 108 (90 Base) MCG/ACT inhaler INHALE UP TO  2 PUFFS BY MOUTH EVERY 4 HOURS AS NEEDED   Vitamin D, Ergocalciferol, (DRISDOL) 1.25 MG (50000 UNIT) CAPS capsule Take 1 capsule (50,000 Units total) by mouth every Sunday.                   Objective:   Physical Exam  Wts  05/26/2022      125  10/13/2021       133  04/15/2021         136  10/06/2020         135  10/04/2019         126  02/19/2019         137  01/12/2018       137  10/11/2017       131  07/11/2017         125  01/11/2017         139  07/11/2016         144   04/11/2016        147          12 /05/17 157 lb 3.2 oz (71.3 kg)  11/04/15 153 lb 12.8 oz (69.8 kg)  08/25/15 160 lb 12.8 oz (72.9 kg)    Vital signs reviewed  05/26/2022  - Note at rest 02 sats  95% on RA   General appearance:    disheveled thin chronically ill appearing ambwm nad  HEENT :  Oropharynx  clear/ edentulous     NECK :  without JVD/Nodes/TM/ nl carotid upstrokes bilaterally   LUNGS: no acc muscle use,  Mod barrel  contour chest wall with bilateral  Distant bs s audible wheeze and  with  cough on insp   maneuvers and mod  Hyperresonant  to  percussion bilaterally     CV:  RRR  no s3 or murmur or increase in  P2, and no edema   ABD:   soft and nontender with pos mid insp Hoover's  in the supine position. No bruits or organomegaly appreciated, bowel sounds nl  MS:   Ext warm without deformities or   obvious joint restrictions , calf tenderness, cyanosis or clubbing  SKIN: warm and dry without lesions    NEURO:  alert, approp, nl sensorium with  no motor or cerebellar deficits apparent.          I personally reviewed images and agree with radiology impression as follows:   Chest CTa    03/31/22  No evidence of PE, Ca  Pos emphysema              Assessment:

## 2022-05-26 ENCOUNTER — Ambulatory Visit: Payer: Medicare HMO | Admitting: Internal Medicine

## 2022-05-26 ENCOUNTER — Encounter: Payer: Self-pay | Admitting: Internal Medicine

## 2022-05-26 VITALS — BP 121/71 | HR 72 | Ht 69.0 in | Wt 125.9 lb

## 2022-05-26 DIAGNOSIS — F17210 Nicotine dependence, cigarettes, uncomplicated: Secondary | ICD-10-CM

## 2022-05-26 DIAGNOSIS — J9612 Chronic respiratory failure with hypercapnia: Secondary | ICD-10-CM | POA: Diagnosis not present

## 2022-05-26 DIAGNOSIS — J449 Chronic obstructive pulmonary disease, unspecified: Secondary | ICD-10-CM | POA: Diagnosis not present

## 2022-05-26 DIAGNOSIS — G8929 Other chronic pain: Secondary | ICD-10-CM

## 2022-05-26 DIAGNOSIS — R109 Unspecified abdominal pain: Secondary | ICD-10-CM

## 2022-05-26 MED ORDER — STIOLTO RESPIMAT 2.5-2.5 MCG/ACT IN AERS: 2.0000 | INHALATION_SPRAY | Freq: Every day | RESPIRATORY_TRACT | 0 refills | Status: DC

## 2022-05-26 MED ORDER — STIOLTO RESPIMAT 2.5-2.5 MCG/ACT IN AERS: 2.0000 | INHALATION_SPRAY | Freq: Every day | RESPIRATORY_TRACT | 11 refills | Status: DC

## 2022-05-26 MED ORDER — FAMOTIDINE 20 MG PO TABS: ORAL_TABLET | ORAL | 11 refills | Status: DC

## 2022-05-26 MED ORDER — PREDNISONE 10 MG PO TABS: ORAL_TABLET | ORAL | 2 refills | Status: DC

## 2022-05-26 NOTE — Patient Instructions (Addendum)
Pepcid 20 mg after 1st and last meals  GERD (REFLUX)  is an extremely common cause of respiratory symptoms just like yours , many times with no obvious heartburn at all.    It can be treated with medication, but also with lifestyle changes including elevation of the head of your bed (ideally with 6 -8inch blocks under the headboard of your bed),  Smoking cessation, avoidance of late meals, excessive alcohol, and avoid fatty foods, chocolate, peppermint, colas, red wine, and acidic juices such as orange juice.  NO MINT OR MENTHOL PRODUCTS SO NO COUGH DROPS  USE SUGARLESS CANDY INSTEAD (Jolley ranchers or Stover's or Life Savers) or even ice chips will also do - the key is to swallow to prevent all throat clearing. NO OIL BASED VITAMINS - use powdered substitutes.  Avoid fish oil when coughing.   Plan A = Automatic = Always=    stiolto 2 puffs each am and prednisone 10 mg 2 daily until better then one daily   Work on inhaler technique:  relax and gently blow all the way out then take a nice smooth full deep breath back in, triggering the inhaler at same time you start breathing in.  Hold breath in for at least  5 seconds if you can.   Rinse and gargle with water when done.  If mouth or throat bother you at all,  try brushing teeth/gums/tongue with arm and hammer toothpaste/ make a slurry and gargle and spit out.   >>>  Remember how golfers warm up by taking practice swings - do this with an empty inhaler   Plan B = Backup (to supplement plan A, not to replace it) Only use your albuterol inhaler as a rescue medication to be used if you can't catch your breath by resting or doing a relaxed purse lip breathing pattern.  - The less you use it, the better it will work when you need it. - Ok to use the inhaler up to 2 puffs  every 4 hours if you must but call for appointment if use goes up over your usual need - Don't leave home without it !!  (think of it like the spare tire for your car)   Plan C =  Crisis (instead of Plan B but only if Plan B stops working) - only use your albuterol nebulizer if you first try Plan B and it fails to help > ok to use the nebulizer up to every 4 hours but if start needing it regularly call for immediate appointment   Please schedule a follow up visit in 3 months but call sooner if needed

## 2022-05-27 ENCOUNTER — Encounter: Payer: Self-pay | Admitting: Internal Medicine

## 2022-05-27 DIAGNOSIS — G8929 Other chronic pain: Secondary | ICD-10-CM | POA: Insufficient documentation

## 2022-05-27 NOTE — Assessment & Plan Note (Signed)
4-5 min discussion re active cigarette smoking in addition to office E&M  Ask about tobacco use:   on going  Advise quitting   I took an extended  opportunity with this patient to outline the consequences of continued cigarette use  in airway disorders based on all the data we have from the multiple national lung health studies (perfomed over decades at millions of dollars in cost)  indicating that smoking cessation, not choice of inhalers or physicians, is the most important aspect of his care and should result in improvement in appetite.  Assess willingness:  Not committed at this point Assist in quit attempt:  Per PCP when ready Arrange follow up:   Follow up per Primary Care planned

## 2022-05-27 NOTE — Assessment & Plan Note (Signed)
HCO3 12/08/2015 = 34  HC03   03/18/17    = 27 -   10/04/2019   Walked RA  3 laps @ approx 270ft each @ nl pace  stopped due to end of study,  Mild sob at end with sats still 95%  - HC03  01/27/20  = 32  - HC03   03/31/22 = 30   Improved from prior studies

## 2022-05-27 NOTE — Assessment & Plan Note (Signed)
Active smoker - reports quit smokng 06/2013> resumed by ov 04/11/2016   - Spirometry 08/19/2013  FEV1 1.85 ( 47%) ratio 44  - Spirometry 12/08/2015   FEV1 1.53 (43%)  Ratio 40 off maint rx  - 12/08/2015  After extensive coaching HFA effectiveness =    75% > start symb 160/atrovent qid   - Allergy profile 12/08/15  >  Eos 0.0 /  IgE  347  RAST pos dust/ mold - [facility] AT screen 12/08/15 > MM/ levels ok   - 10/11/2017    changed to spiriva smi   - 05/26/2022  After extensive coaching inhaler device,  effectiveness =  80% from baseline of 50%with smi and severe cough on insp hfa  > try stiolto 2 each am and pred 20 until better then 10 mg daily    Group D (now reclassified as E) in terms of symptom/risk and laba/lama/ICS  therefore appropriate rx at this point >>>  stiolto plus pred 10 mg daily with option for 20mg  daily for flares and more approp use for saba:  Re SABA :  I spent extra time with pt today reviewing appropriate use of albuterol for prn use on exertion with the following points: 1) saba is for relief of sob that does not improve by walking a slower pace or resting but rather if the pt does not improve after trying this first. 2) If the pt is convinced, as many are, that saba helps recover from activity faster then it's easy to tell if this is the case by re-challenging : ie stop, take the inhaler, then p 5 minutes try the exact same activity (intensity of workload) that just caused the symptoms and see if they are substantially diminished or not after saba 3) if there is an activity that reproducibly causes the symptoms, try the saba 15 min before the activity on alternate days   If in fact the saba really does help, then fine to continue to use it prn but advised may need to look closer at the maintenance regimen being used to achieve better control of airways disease with exertion.

## 2022-05-27 NOTE — Assessment & Plan Note (Signed)
Assoc with bloating /belching on ppi as of 05/26/2022 > try pepcid 20 mg bid and gerd diet and off ppi pending GI eval          Each maintenance medication was reviewed in detail including emphasizing most importantly the difference between maintenance and prns and under what circumstances the prns are to be triggered using an action plan format where appropriate.  Total time for H and P, chart review, counseling, reviewing hfa/smi(respimat) device(s) and generating customized AVS unique to this office visit / same day charting = > 30 min

## 2022-06-08 ENCOUNTER — Ambulatory Visit (INDEPENDENT_AMBULATORY_CARE_PROVIDER_SITE_OTHER): Payer: Medicare HMO

## 2022-06-08 DIAGNOSIS — I495 Sick sinus syndrome: Secondary | ICD-10-CM | POA: Diagnosis not present

## 2022-06-09 LAB — CUP PACEART REMOTE DEVICE CHECK
Battery Impedance: 1421 Ohm
Battery Remaining Longevity: 58 mo
Battery Voltage: 2.76 V
Brady Statistic AP VP Percent: 0 %
Brady Statistic AP VS Percent: 42 %
Brady Statistic AS VP Percent: 0 %
Brady Statistic AS VS Percent: 57 %
Date Time Interrogation Session: 20240605164127
Implantable Lead Connection Status: 753985
Implantable Lead Connection Status: 753985
Implantable Lead Implant Date: 20050421
Implantable Lead Implant Date: 20050421
Implantable Lead Location: 753859
Implantable Lead Location: 753860
Implantable Lead Model: 5076
Implantable Lead Model: 5076
Implantable Pulse Generator Implant Date: 20140904
Lead Channel Impedance Value: 480 Ohm
Lead Channel Impedance Value: 582 Ohm
Lead Channel Pacing Threshold Amplitude: 0.625 V
Lead Channel Pacing Threshold Amplitude: 1 V
Lead Channel Pacing Threshold Pulse Width: 0.4 ms
Lead Channel Pacing Threshold Pulse Width: 0.4 ms
Lead Channel Setting Pacing Amplitude: 1.5 V
Lead Channel Setting Pacing Amplitude: 2 V
Lead Channel Setting Pacing Pulse Width: 0.4 ms
Lead Channel Setting Sensing Sensitivity: 5.6 mV
Zone Setting Status: 755011
Zone Setting Status: 755011

## 2022-06-22 ENCOUNTER — Encounter: Payer: Self-pay | Admitting: Emergency Medicine

## 2022-06-22 ENCOUNTER — Ambulatory Visit
Admission: EM | Admit: 2022-06-22 | Discharge: 2022-06-22 | Disposition: A | Discharge status: Home / Self Care | Payer: Medicare HMO | Attending: Family Medicine | Admitting: Family Medicine

## 2022-06-22 ENCOUNTER — Other Ambulatory Visit: Payer: Self-pay

## 2022-06-22 ENCOUNTER — Ambulatory Visit (INDEPENDENT_AMBULATORY_CARE_PROVIDER_SITE_OTHER): Payer: Medicare HMO

## 2022-06-22 DIAGNOSIS — J441 Chronic obstructive pulmonary disease with (acute) exacerbation: Secondary | ICD-10-CM | POA: Diagnosis not present

## 2022-06-22 DIAGNOSIS — R059 Cough, unspecified: Secondary | ICD-10-CM | POA: Diagnosis not present

## 2022-06-22 MED ORDER — AZITHROMYCIN 250 MG PO TABS: 250.0000 mg | ORAL_TABLET | Freq: Every day | ORAL | 0 refills | Status: DC

## 2022-06-22 MED ORDER — PREDNISONE 10 MG (21) PO TBPK: ORAL_TABLET | Freq: Every day | ORAL | 0 refills | Status: DC

## 2022-06-22 NOTE — ED Triage Notes (Addendum)
Pt family reports nasal congestion,productive cough with intermittent bloody sputum that has progressively gotten worse over last 3 weeks.   Pt also reports bee sting to RLQ and reports pain ever since.

## 2022-06-22 NOTE — Discharge Instructions (Signed)
DG Chest 2 View  Result Date: 06/22/2022 CLINICAL DATA:  Persistent cough EXAM: CHEST - 2 VIEW COMPARISON:  03/31/2018 for FINDINGS: Left chest wall pacer noted with leads in the right atrial appendage and right ventricle. There is no pleural fluid, interstitial edema or airspace disease. The lungs are hyperinflated and there diffuse coarsened interstitial markings of COPD/emphysema. Again seen is left upper confluent scarring and volume loss with architectural distortion. No acute osseous abnormality. IMPRESSION: 1. No acute cardiopulmonary abnormalities. 2. COPD/emphysema. 3. Left upper lobe scarring and volume loss. Electronically Signed   By: Signa Kell M.D.   On: 06/22/2022 17:22

## 2022-06-23 NOTE — ED Provider Notes (Signed)
[facility]   [id number] 06/22/22 Arrival Time: 1549  ASSESSMENT & PLAN:  1. COPD exacerbation (HCC)    I have personally viewed and independently interpreted the imaging studies ordered this visit. COPD changes. No acute changes; no PNA.  No resp distress. Begin: Meds ordered this encounter  Medications   azithromycin (ZITHROMAX) 250 MG tablet    Sig: Take 1 tablet (250 mg total) by mouth daily. Take first 2 tablets together, then 1 every day until finished.    Dispense:  6 tablet    Refill:  0   predniSONE (STERAPRED UNI-PAK 21 TAB) 10 MG (21) TBPK tablet    Sig: Take by mouth daily. Take as directed.    Dispense:  21 tablet    Refill:  0   OTC symptom care as needed.  Recommend:  Follow-up Information     [name], MD.   Specialty: Family Medicine Why: As needed. Contact information: [address] [place] [address] [phone]                 Reviewed expectations re: course of current medical issues. Questions answered. Outlined signs and symptoms indicating need for more acute intervention. Patient verbalized understanding. After Visit Summary given.  SUBJECTIVE: History from: patient.  Michael [name] is a 62 y.o. male who presents with complaint of pt family reports nasal congestion, productive cough with intermittent bloody sputum that has progressively gotten worse over last 3 weeks. No current SOB except baseline. Denies CP. Social History   Tobacco Use  Smoking Status Every Day   Packs/day: 1.00   Years: 44.00   Additional pack years: 0.00   Total pack years: 44.00   Types: Cigarettes  Smokeless Tobacco Never  Tobacco Comments   1ppd, verified by [facility] 05/26/2022     OBJECTIVE:  Vitals:   06/22/22 1654  BP: 137/89  Pulse: 69  Resp: 20  Temp: (!) 97.5 F (36.4 C)  TempSrc: Oral  SpO2: 98%     General appearance: alert; NAD HEENT: [place]; AT; with mild nasal congestion Neck: supple without LAD Cv: RRR without  murmer Lungs: unlabored respirations, moderate bilateral expiratory wheezing; cough: moderate; no significant respiratory distress Skin: warm and dry Psychological: alert and cooperative; normal mood and affect  Imaging: DG Chest 2 View  Result Date: 06/22/2022 CLINICAL DATA:  Persistent cough EXAM: CHEST - 2 VIEW COMPARISON:  03/31/2018 for FINDINGS: Left chest wall pacer noted with leads in the right atrial appendage and right ventricle. There is no pleural fluid, interstitial edema or airspace disease. The lungs are hyperinflated and there diffuse coarsened interstitial markings of COPD/emphysema. Again seen is left upper confluent scarring and volume loss with architectural distortion. No acute osseous abnormality. IMPRESSION: 1. No acute cardiopulmonary abnormalities. 2. COPD/emphysema. 3. Left upper lobe scarring and volume loss. Electronically Signed   By: Signa Kell M.D.   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   Seizure disorder (HCC)    Seizures (HCC)    as a baby   Shortness of breath    Tobacco abuse    Family History  Problem Relation Age of Onset   Heart Problems Mother        MVP   Heart disease Mother    CAD Father    COPD Father    Heart disease Father    Other Sister        crib dealth   Heart attack Maternal Grandmother     Hypertension Maternal Grandmother    Heart attack Paternal Grandmother    Hypertension Paternal Grandmother    Heart attack Paternal Grandfather    Hypertension Paternal Grandfather    Heart Problems Other        aunts x 2 died of heart problems    Social History   Socioeconomic History   Marital status: Divorced    Spouse name: Not on file   Number of children: 2   Years of education: 12   Highest education level: Not on file  Occupational History   Occupation: Disabled  Tobacco Use   Smoking status: Every Day    Packs/day: 1.00    Years: 44.00    Additional pack years: 0.00    Total pack years: 44.00    Types: Cigarettes   Smokeless tobacco: Never   Tobacco comments:    1ppd, verified by [facility] 05/26/2022  Vaping Use   Vaping Use: Never used  Substance and Sexual Activity   Alcohol use: No   Drug use: Never   Sexual activity: Not Currently  Other Topics Concern   Not on file  Social History Narrative   Not on file   Social Determinants of Health   Financial Resource Strain: Low Risk  (10/21/2021)   Overall Financial Resource Strain (CARDIA)    Difficulty of Paying Living Expenses: Not hard at all  Food Insecurity: No Food Insecurity (10/21/2021)   Hunger Vital Sign    Worried About Running Out of Food in the Last Year: Never true    Ran Out of Food in the Last Year: Never true  Transportation Needs: No Transportation Needs (10/21/2021)   PRAPARE - Administrator, Civil Service (Medical): No    Lack of Transportation (Non-Medical): No  Physical Activity: Inactive (10/21/2021)   Exercise Vital Sign    Days of Exercise per Week: 0 days    Minutes of Exercise per Session: 0 min  Stress: No Stress Concern Present (10/21/2021)   [employer] of Occupational Health - Occupational Stress Questionnaire    Feeling of Stress : Only a little  Social Connections: Moderately Isolated (10/21/2021)   Social Connection and Isolation Panel [NHANES]     Frequency of Communication with Friends and Family: More than three times a week    Frequency of Social Gatherings with Friends and Family: Twice a week    Attends Religious Services: Never    Database administrator or Organizations: Yes    Attends Banker Meetings: Never    Marital Status: Divorced  Catering manager Violence: Not At Risk (10/21/2021)   Humiliation, Afraid, Rape, and Kick questionnaire    Fear of Current or Ex-Partner: No    Emotionally Abused: No    Physically  Abused: No    Sexually Abused: No             Mardella Layman, MD 06/23/22 0930

## 2022-06-28 ENCOUNTER — Ambulatory Visit (INDEPENDENT_AMBULATORY_CARE_PROVIDER_SITE_OTHER): Payer: Medicare HMO | Admitting: Family Medicine

## 2022-06-28 VITALS — BP 126/80 | HR 68 | Temp 97.5°F | Ht 69.0 in | Wt 124.0 lb

## 2022-06-28 DIAGNOSIS — J449 Chronic obstructive pulmonary disease, unspecified: Secondary | ICD-10-CM

## 2022-06-28 DIAGNOSIS — F419 Anxiety disorder, unspecified: Secondary | ICD-10-CM

## 2022-06-28 DIAGNOSIS — K219 Gastro-esophageal reflux disease without esophagitis: Secondary | ICD-10-CM

## 2022-06-28 DIAGNOSIS — G8929 Other chronic pain: Secondary | ICD-10-CM

## 2022-06-28 DIAGNOSIS — R109 Unspecified abdominal pain: Secondary | ICD-10-CM | POA: Diagnosis not present

## 2022-06-28 DIAGNOSIS — R3911 Hesitancy of micturition: Secondary | ICD-10-CM

## 2022-06-28 DIAGNOSIS — F32A Depression, unspecified: Secondary | ICD-10-CM

## 2022-06-28 LAB — URINALYSIS, ROUTINE W REFLEX MICROSCOPIC
Bilirubin Urine: NEGATIVE
Hgb urine dipstick: NEGATIVE
Ketones, ur: NEGATIVE
Leukocytes,Ua: NEGATIVE
Nitrite: NEGATIVE
RBC / HPF: NONE SEEN (ref 0–?)
Specific Gravity, Urine: <=1.005 — AB (ref 1.000–1.030)
Total Protein, Urine: NEGATIVE
Urine Glucose: NEGATIVE
Urobilinogen, UA: 0.2 (ref 0.0–1.0)
WBC, UA: NONE SEEN (ref 0–?)
pH: 6.5 (ref 5.0–8.0)

## 2022-06-28 MED ORDER — FLUOXETINE HCL 40 MG PO CAPS: 80.0000 mg | ORAL_CAPSULE | Freq: Every day | ORAL | 3 refills | Status: DC

## 2022-06-28 MED ORDER — OMEPRAZOLE 20 MG PO CPDR: 20.0000 mg | DELAYED_RELEASE_CAPSULE | Freq: Every day | ORAL | 3 refills | Status: DC

## 2022-06-28 MED ORDER — PREDNISONE 50 MG PO TABS: ORAL_TABLET | ORAL | 0 refills | Status: DC

## 2022-06-28 MED ORDER — AMOXICILLIN-POT CLAVULANATE 875-125 MG PO TABS: 1.0000 | ORAL_TABLET | Freq: Two times a day (BID) | ORAL | 0 refills | Status: DC

## 2022-06-28 NOTE — Assessment & Plan Note (Signed)
Will be restarting Prozac as above.  Was most recently on 80 mg daily however he ran out about a month ago and has not had it refilled.

## 2022-06-28 NOTE — Patient Instructions (Signed)
It was very nice to see you today!  I think you probably have a sinus infection. Please start the Augmentin.  Also start the prednisone.  Let us know if not improving.  I will refill your omeprazole.  We will restart Prozac.  Please take 1 pill daily for a week or so and then increase to 2 pills daily.  Will check a urine culture and urine test today to make sure we do not have any signs of an infection.  Will also check a CT scan to look for any other causes of your abdominal pain.  No follow-ups on file.   Take care, Dr Jimmey Ralph  PLEASE NOTE:  If you had any lab tests, please let us know if you have not heard back within a few days. You may see your results on mychart before we have a chance to review them but we will give you a call once they are reviewed by [place].   If we ordered any referrals today, please let us know if you have not heard from their office within the next week.   If you had any urgent prescriptions sent in today, please check with the pharmacy within an hour of our visit to make sure the prescription was transmitted appropriately.   Please try these tips to maintain a healthy lifestyle:  Eat at least 3 REAL meals and 1-2 snacks per day.  Aim for no more than 5 hours between eating.  If you eat breakfast, please do so within one hour of getting up.   Each meal should contain half fruits/vegetables, one quarter protein, and one quarter carbs (no bigger than a computer mouse)  Cut down on sweet beverages. This includes juice, soda, and sweet tea.   Drink at least 1 glass of water with each meal and aim for at least 8 glasses per day  Exercise at least 150 minutes every week.

## 2022-06-28 NOTE — Assessment & Plan Note (Signed)
He has been on Prozac 80 mg daily.  However he ran out of prescription about a month ago and has been off since.  He has had worsening anxiety since then.  He would like to restart.  Will refill his prescription today however advised patient to ramp up slowly over the next few weeks.  He can follow-up with [place] in acute weeks if any new issues arise.

## 2022-06-28 NOTE — Assessment & Plan Note (Signed)
Recently switched to famotidine however this has not been as effective as the omeprazole.  He would like to go back to omeprazole.  Will refill today.

## 2022-06-28 NOTE — Progress Notes (Signed)
   Michael [name] is a 62 y.o. male who presents today for an office visit.  Assessment/Plan:  New/Acute Problems: Sinusitis  Given length of symptoms will start course of antibiotics.  Did not have any improvement with azithromycin.  Will start Augmentin.  Also start prednisone burst.  Encouraged hydration.  He can also use over-the-counter meds as needed.  If no improvement with this we will need to be referred to ENT.  Urinary Hesitancy May have BPH or LUTS how ever we will check UA and urine culture to rule out infection or other possible causes.  Depending on results may consider empiric trial of Flomax versus referral to urology.  Chronic Problems Addressed Today: GERD (gastroesophageal reflux disease) Recently switched to famotidine however this has not been as effective as the omeprazole.  He would like to go back to omeprazole.  Will refill today.  Anxiety He has been on Prozac 80 mg daily.  However he ran out of prescription about a month ago and has been off since.  He has had worsening anxiety since then.  He would like to restart.  Will refill his prescription today however advised patient to ramp up slowly over the next few weeks.  He can follow-up with [place] in acute weeks if any new issues arise.  Depression Will be restarting Prozac as above.  Was most recently on 80 mg daily however he ran out about a month ago and has not had it refilled.  Chronic abdominal pain He has follow-up with GI planned in a couple of months.  Still has persistent symptoms.  Will be switching back to omeprazole as he felt like this was doing better than the Pepcid.  Given chronicity of symptoms and prolonged wait till see GI we will check CT abdomen and pelvis to rule out any other possible causes.     Subjective:  HPI:  See Assessment / plan for status of chronic conditions.  Main concern today is cough and congestion. This has been going on for a few weeks. He did go to the Emergency Department a  week ago and was diagnosed with a COPD exacerbation. They gave him azithromycin and prednisone. Azithromycin did not help and he did not starting prednisone due to concern about how to take the medication.  Has a lot of ear fullness.  Difficult time breathing through his nose.  Has tried allergy meds without much improvement.  He is also having some urinary frequency. This has been going on for months. No fevers or chills. No dysuria. Sometimes hard to push urine through        Objective:  Physical Exam: BP 126/80   Pulse 68   Temp (!) 97.5 F (36.4 C) (Temporal)   Ht 5\' 9"  (1.753 m)   Wt 124 lb (56.2 kg)   SpO2 97%   BMI 18.31 kg/m   Gen: No acute distress, resting comfortably HEENT: TMs with clear effusion.  OP erythematous.  Nasal mucosa erythematous and boggy bilaterally with clear discharge. CV: Regular rate and rhythm with no murmurs appreciated Pulm: Normal work of breathing, clear to auscultation bilaterally with no crackles, wheezes, or rhonchi Neuro: Grossly normal, moves all extremities Psych: Normal affect and thought content      Michael Belmares M. Jimmey Ralph, MD 06/28/2022 12:53 PM

## 2022-06-28 NOTE — Assessment & Plan Note (Signed)
He has follow-up with GI planned in a couple of months.  Still has persistent symptoms.  Will be switching back to omeprazole as he felt like this was doing better than the Pepcid.  Given chronicity of symptoms and prolonged wait till see GI we will check CT abdomen and pelvis to rule out any other possible causes.

## 2022-06-29 LAB — URINE CULTURE
MICRO NUMBER:: 15125142
Result:: NO GROWTH
SPECIMEN QUALITY:: ADEQUATE

## 2022-06-30 ENCOUNTER — Other Ambulatory Visit: Payer: Self-pay

## 2022-06-30 DIAGNOSIS — R3911 Hesitancy of micturition: Secondary | ICD-10-CM

## 2022-06-30 MED ORDER — TAMSULOSIN HCL 0.4 MG PO CAPS: 0.4000 mg | ORAL_CAPSULE | Freq: Every day | ORAL | 3 refills | Status: DC

## 2022-06-30 NOTE — Progress Notes (Signed)
Urine culture is negative.  He likely has enlarged prostate causing his symptoms.  We can refer him to urology.  Please place referral if he is agreeable.  If he wishes to try medication first we can try Flomax 0.4 mg daily.

## 2022-07-05 NOTE — Progress Notes (Signed)
Remote pacemaker transmission.   

## 2022-07-06 ENCOUNTER — Ambulatory Visit (HOSPITAL_COMMUNITY)
Admission: RE | Admit: 2022-07-06 | Discharge: 2022-07-06 | Disposition: A | Discharge status: Home / Self Care | Payer: Medicare HMO | Source: Ambulatory Visit | Attending: Acute Care | Admitting: Acute Care

## 2022-07-06 DIAGNOSIS — R911 Solitary pulmonary nodule: Secondary | ICD-10-CM | POA: Diagnosis not present

## 2022-07-06 DIAGNOSIS — Z87891 Personal history of nicotine dependence: Secondary | ICD-10-CM

## 2022-07-06 DIAGNOSIS — F17210 Nicotine dependence, cigarettes, uncomplicated: Secondary | ICD-10-CM | POA: Diagnosis not present

## 2022-07-18 ENCOUNTER — Ambulatory Visit: Payer: Medicare HMO | Admitting: Family Medicine

## 2022-07-19 ENCOUNTER — Ambulatory Visit (INDEPENDENT_AMBULATORY_CARE_PROVIDER_SITE_OTHER): Payer: Medicare HMO | Admitting: Physician Assistant

## 2022-07-19 ENCOUNTER — Encounter: Payer: Self-pay | Admitting: Physician Assistant

## 2022-07-19 VITALS — BP 118/73 | HR 79 | Ht 69.0 in | Wt 127.4 lb

## 2022-07-19 DIAGNOSIS — H6993 Unspecified Eustachian tube disorder, bilateral: Secondary | ICD-10-CM | POA: Diagnosis not present

## 2022-07-19 DIAGNOSIS — J329 Chronic sinusitis, unspecified: Secondary | ICD-10-CM

## 2022-07-19 MED ORDER — FLUTICASONE PROPIONATE 50 MCG/ACT NA SUSP: 2.0000 | Freq: Every day | NASAL | 3 refills | Status: DC

## 2022-07-19 NOTE — Progress Notes (Signed)
Subjective:    Patient ID: Michael [name], male    DOB: 02-05-1960, 62 y.o.   MRN: [id number]  Chief Complaint  Patient presents with   Medical Management of Chronic Issues    Bilateral ears are clogged, says he thinks that it could be wax build up or allergies, has been a problem for months    HPI Patient is in today for ears and nose feeling "really stopped up." Here with his mother today.  Coughing up brown / yellow mucous, ongoing.  Stays wheezy per patient.   Just finished Augmentin, felt better, but ears and sinuses still feel clogged, no longer having pain in sinuses or headaches.   Follows with pulmonology. Stays on prednisone 10 mg daily. Uses inhalers.   Says he thinks he might be using Flonase daily ?unsure if expired, poor historian.  Not using any saline spray.  Uses Mucinex twice daily.  Takes some type of allergy medication in the evening time.     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   GALLBLADDER SURGERY  2003   HAND SURGERY  1998   x3   OPEN REDUCTION INTERNAL FIXATION (ORIF) DISTAL RADIAL FRACTURE Right 09/07/2016   Procedure: RIGHT WRIST OPEN REDUCTION INTERNAL FIXATION (ORIF) REPAIR AS  INDICATED;  Surgeon: Bradly Bienenstock, MD;  Location: [facility] OR;  Service: Orthopedics;  Laterality: Right;   PACEMAKER INSERTION  04/23/2003   Medtronic Kappa; [facility] - symptomatic bradycardia   PERMANENT PACEMAKER GENERATOR CHANGE N/A 09/06/2012   Procedure: PERMANENT PACEMAKER GENERATOR CHANGE;  Surgeon: Thurmon Fair, MD;  Location: [facility] CATH LAB;  Service: Cardiovascular;  Laterality: N/A;   TONSILLECTOMY     TRANSTHORACIC ECHOCARDIOGRAM  05/14/2012   EF 50-55%, normal systolic function; mild MR (ordered for mitral valve disease 424.0)    Family History  Problem Relation Age of Onset   Heart Problems Mother        MVP   Heart disease Mother    CAD Father    COPD Father    Heart disease Father    Other Sister        crib dealth   Heart attack Maternal Grandmother    Hypertension Maternal Grandmother    Heart attack Paternal Grandmother    Hypertension Paternal Grandmother    Heart attack Paternal Grandfather    Hypertension Paternal Grandfather    Heart Problems Other        aunts x 2 died of heart problems     Social History  Tobacco Use   Smoking status: Every Day    Current packs/day: 1.00    Average packs/day: 1 pack/day for 44.0 years (44.0 ttl pk-yrs)    Types: Cigarettes   Smokeless tobacco: Never   Tobacco comments:    1ppd, verified by [facility] 05/26/2022  Vaping Use   Vaping status: Never Used  Substance Use Topics   Alcohol use: No   Drug use: Never     Allergies  Allergen Reactions   Shellfish Allergy Anaphylaxis    Review of Systems NEGATIVE UNLESS OTHERWISE INDICATED IN HPI      Objective:     BP 118/73 (BP Location: Right Arm, Patient Position: Sitting, Cuff Size: Normal)   Pulse 79   Ht 5\' 9"  (1.753 m)   Wt 127 lb 6.4 oz (57.8 kg)   SpO2 96%   BMI 18.81 kg/m   Wt Readings from Last 3 Encounters:  07/19/22 127 lb 6.4 oz (57.8 kg)  06/28/22 124 lb (56.2 kg)  05/26/22 125 lb 14.4 oz (57.1 kg)    BP Readings from Last 3  Encounters:  07/19/22 118/73  06/28/22 126/80  06/22/22 137/89     Physical Exam Vitals reviewed.  Constitutional:      Comments: thin  HENT:     Right Ear: A middle ear effusion is present. There is no impacted cerumen.     Left Ear: A middle ear effusion is present. There is no impacted cerumen.     Nose:     Right Turbinates: Enlarged.     Left Turbinates: Enlarged.     Right Sinus: No maxillary sinus tenderness or frontal sinus tenderness.     Left Sinus: No maxillary sinus tenderness or frontal sinus tenderness.  Cardiovascular:     Rate and Rhythm: Normal rate and regular rhythm.     Heart sounds: No murmur heard. Pulmonary:     Breath sounds: Wheezing (scattered, expiratory) present. No rhonchi.  Neurological:     Mental Status: He is alert.        Assessment & Plan:  Dysfunction of Eustachian tube, bilateral -     Ambulatory referral to ENT  Recurrent sinus infections -     Ambulatory referral to ENT  Other orders -     Fluticasone Propionate; Place 2 sprays into both nostrils daily. Spray upward and outward.  Dispense: 16 g; Refill: 3   Pick-up "Ayr" nasal saline from over the counter.  Use this spray in the nose at least three times daily.   Use the Flonase once daily as directed.  Referral to ENT.  DO NOT USE AFRIN NASAL SPRAY.  QUIT SMOKING.     No follow-ups on file.   Davit Vassar M Lowen Barringer, PA-C

## 2022-07-19 NOTE — Patient Instructions (Addendum)
Pick-up "Ayr" nasal saline from over the counter.  Use this spray in the nose at least three times daily.   Use the Flonase once daily as directed.  Referral to ENT.  DO NOT USE AFRIN NASAL SPRAY.  QUIT SMOKING.

## 2022-08-02 ENCOUNTER — Ambulatory Visit: Payer: Medicare HMO

## 2022-08-04 ENCOUNTER — Encounter: Payer: Medicare HMO | Admitting: Family Medicine

## 2022-08-07 IMAGING — DX DG CHEST 2V
2 series · 2 of 2 positions shown · non-contrast
Comparison: 07/11/2017 chest radiograph.

CLINICAL DATA: Dyspnea, COPD

EXAM:
CHEST - 2 VIEW

[chest pa]
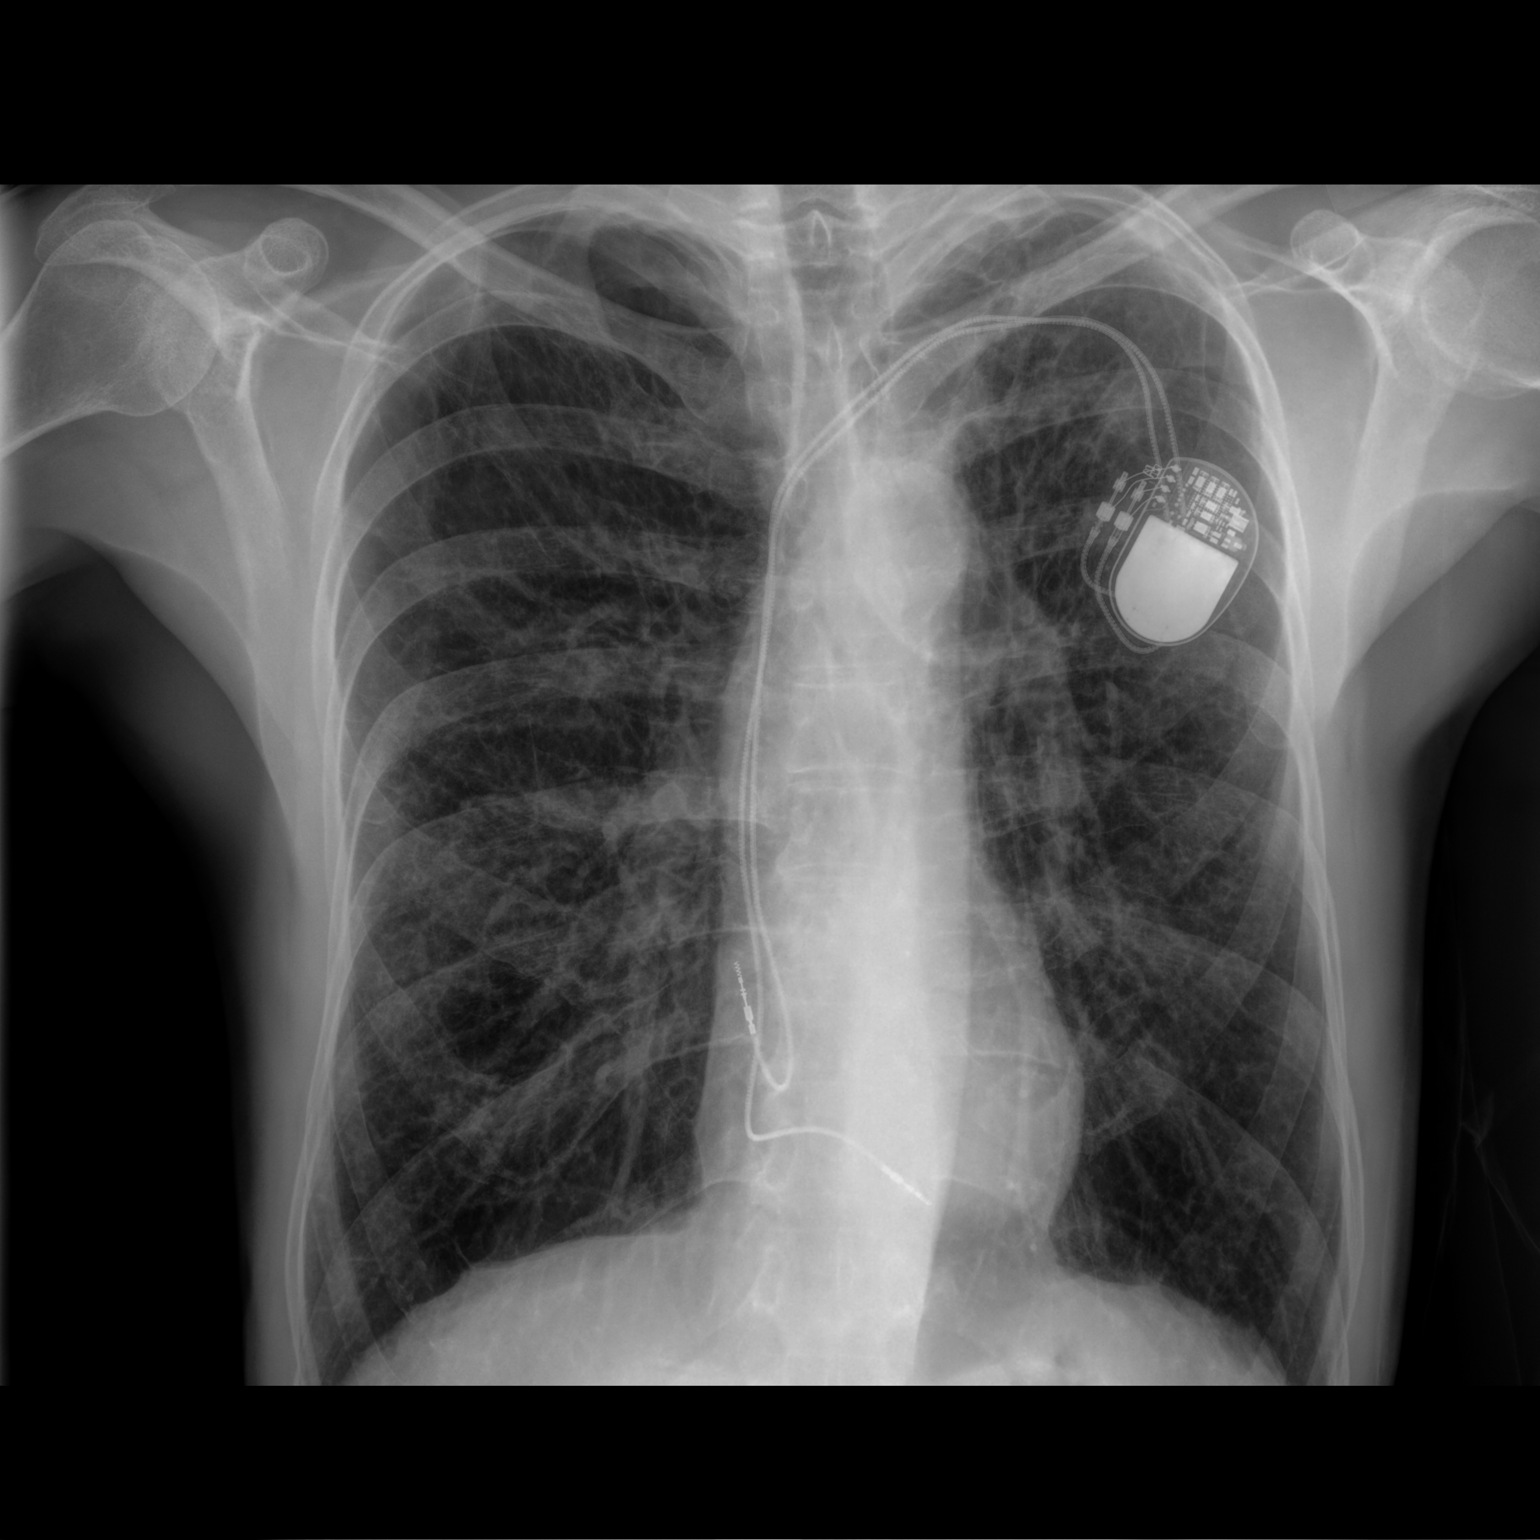

[chest lat]
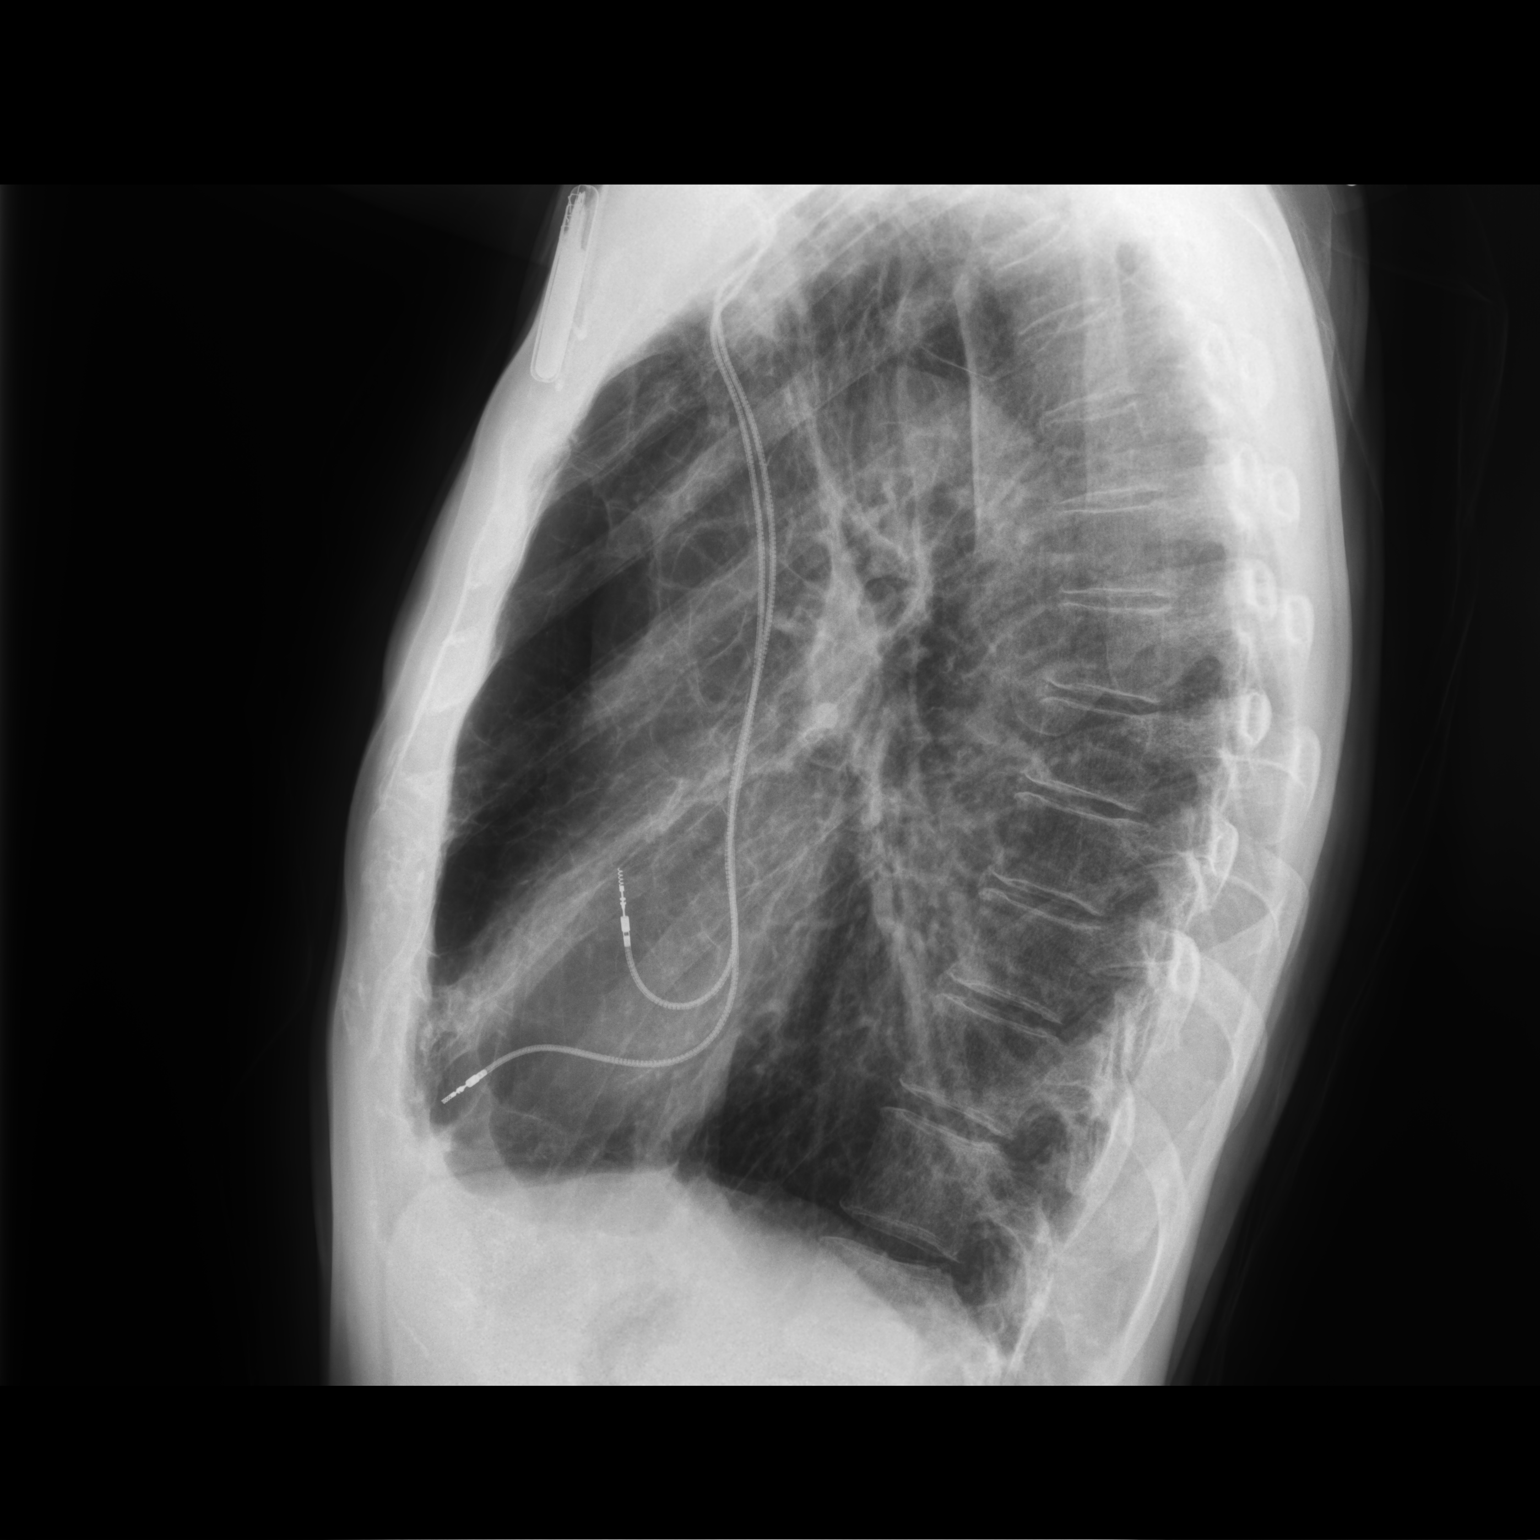

[2 of 2 positions shown; findings below may reference images not displayed]

FINDINGS: Two lead left subclavian pacemaker with lead tips overlying the
right atrium and right ventricle. Stable cardiomediastinal
silhouette with normal heart size. No pneumothorax. No pleural
effusion. Mildly hyperinflated lungs. Emphysema. No pulmonary edema.
No acute consolidative airspace disease.
IMPRESSION: 1. No acute cardiopulmonary disease.
2. Mildly hyperinflated lungs and emphysema, compatible with
reported COPD.

## 2022-08-10 DIAGNOSIS — H524 Presbyopia: Secondary | ICD-10-CM | POA: Diagnosis not present

## 2022-08-10 DIAGNOSIS — H53021 Refractive amblyopia, right eye: Secondary | ICD-10-CM | POA: Diagnosis not present

## 2022-08-15 ENCOUNTER — Ambulatory Visit (HOSPITAL_COMMUNITY)
Admission: RE | Admit: 2022-08-15 | Discharge: 2022-08-15 | Disposition: A | Discharge status: Home / Self Care | Payer: Medicare HMO | Source: Ambulatory Visit | Attending: Family Medicine | Admitting: Family Medicine

## 2022-08-15 DIAGNOSIS — R109 Unspecified abdominal pain: Secondary | ICD-10-CM | POA: Insufficient documentation

## 2022-08-15 DIAGNOSIS — R1031 Right lower quadrant pain: Secondary | ICD-10-CM | POA: Diagnosis not present

## 2022-08-15 DIAGNOSIS — N400 Enlarged prostate without lower urinary tract symptoms: Secondary | ICD-10-CM | POA: Diagnosis not present

## 2022-08-15 MED ORDER — IOHEXOL 300 MG/ML  SOLN
100.0000 mL | Freq: Once | INTRAMUSCULAR | Status: AC | PRN
  Administered 2022-08-15: 100 mL via INTRAVENOUS

## 2022-08-18 ENCOUNTER — Encounter: Payer: Self-pay | Admitting: Cardiovascular Disease

## 2022-08-18 ENCOUNTER — Ambulatory Visit: Payer: Medicare HMO | Attending: Cardiology | Admitting: Cardiovascular Disease

## 2022-08-18 VITALS — BP 112/72 | HR 85 | Ht 69.0 in | Wt 126.8 lb

## 2022-08-18 DIAGNOSIS — I4719 Other supraventricular tachycardia: Secondary | ICD-10-CM

## 2022-08-18 DIAGNOSIS — R64 Cachexia: Secondary | ICD-10-CM | POA: Diagnosis not present

## 2022-08-18 DIAGNOSIS — I495 Sick sinus syndrome: Secondary | ICD-10-CM | POA: Diagnosis not present

## 2022-08-18 DIAGNOSIS — I480 Paroxysmal atrial fibrillation: Secondary | ICD-10-CM

## 2022-08-18 DIAGNOSIS — J449 Chronic obstructive pulmonary disease, unspecified: Secondary | ICD-10-CM

## 2022-08-18 NOTE — Progress Notes (Signed)
Cardiology Office Note:    Date:  08/18/2022   ID:  Michael [name], DOB Feb 25, 1960, MRN [id number]  PCP:  [name], MD  [facility] Cardiologist:  None  [facility] Electrophysiologist:  None   Referring MD: [name], MD   CC: Pacemaker follow up  History of Present Illness:    Michael [name] is a 62 y.o. male with a hx of sinus node dysfunction s/p pacemaker, atrial fibrillation, tobacco use disorder who presents for follow up of pacemaker.  He has been losing weight recently.  He eats very little.  Every time he eats he gets abdominal pain that radiates up into his chest.  He is frequently short of breath, just walking from the home to his shed.  He wheezes.  He continues to smoke about 2 packs of cigarettes a day.  His mother says that he is not eating anything, just drinks [facility] all day.  He has occasional palpitations that are brief and are not associated with dizziness or syncope or worsening dyspnea.  He has not had problems with lower extremity edema, orthopnea, PND, focal neurological deficits or intermittent claudication.  Pacemaker function is normal.  He has a Medtronic Adapta dual-chamber device implanted in 2014 but still has 4.5 years of estimated longevity.  Lead parameters are excellent.  He only has roughly 37% atrial pacing and does not require ventricular pacing.  In the last 15 months he has had about 50 episodes of atrial mode switch the vast majority of which are episodes of sustained but brief atrial tachycardia (lasting between 4 seconds and 5 minutes), sometimes with very fast heart rates in the 200s.  He is also had a couple of episodes of true atrial fibrillation in the last 12 months, 1 lasting for 8 minutes the other lasting for 11 minutes and 30 seconds.  The overall burden of atrial mode switches well under 0.1%.  Pacemaker was interrogated today. Patient is not pacemaker dependent.  1% ventricular pacing.  Lead parameters are all normal. He is  40% atrial paced and 0.5% ventricular paced. All lead parameters were checked and within normal limits.   Nuclear stress test in 2018 was a low risk study, describing diffuse hypokinesis (EF 49%) and possibly a very small amount of apical ischemia.  Reviewed the CT of his abdomen pelvis performed 08/15/2022.  There is no official report yet.  He has some relatively scanty atherosclerosis in the abdominal aorta, particularly in the inferior portion and the iliac branches, but he does not have any evidence of plaque or obstruction to the celiac artery or superior mesenteric artery.  His stomach is very full of fluid content.  Raises a concern about pyloric pathology such as ulcer or neoplasm causing gastric outlet obstruction.  Pending official interpretation.  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   GALLBLADDER SURGERY  2003   HAND SURGERY  1998   x3   OPEN REDUCTION INTERNAL FIXATION (ORIF) DISTAL RADIAL FRACTURE Right 09/07/2016   Procedure: RIGHT WRIST OPEN REDUCTION INTERNAL FIXATION (ORIF) REPAIR AS INDICATED;  Surgeon: Bradly Bienenstock, MD;  Location: [facility] OR;  Service: Orthopedics;  Laterality: Right;   PACEMAKER INSERTION   04/23/2003   Medtronic Kappa; [facility] - symptomatic bradycardia   PERMANENT PACEMAKER GENERATOR CHANGE N/A 09/06/2012   Procedure: PERMANENT PACEMAKER GENERATOR CHANGE;  Surgeon: Thurmon Fair, MD;  Location: [facility] CATH LAB;  Service: Cardiovascular;  Laterality: N/A;   TONSILLECTOMY     TRANSTHORACIC ECHOCARDIOGRAM  05/14/2012   EF 50-55%, normal systolic function; mild MR (ordered for mitral valve disease 424.0)    Current Medications: Current Meds  Medication Sig   albuterol (ACCUNEB) 1.25 MG/3ML nebulizer solution 2.5 mg every 4 hours as needed for cough, shortness of breath and/or wheezing   aspirin EC 81 MG tablet Take 81 mg by mouth daily. Swallow whole.   FLUoxetine (PROZAC) 40 MG capsule Take 2 capsules (80 mg total) by mouth daily.   fluticasone (FLONASE) 50 MCG/ACT nasal spray Place 2 sprays into both nostrils daily. Spray upward and outward.   omeprazole (PRILOSEC) 20 MG capsule Take 1 capsule (20 mg total) by mouth daily.   predniSONE (DELTASONE) 10 MG tablet 2 until better then 1 daily   tamsulosin (FLOMAX) 0.4 MG CAPS capsule Take 1 capsule (0.4 mg total) by mouth daily.   Tiotropium Bromide-Olodaterol (STIOLTO RESPIMAT) 2.5-2.5 MCG/ACT AERS Inhale 2 puffs into the lungs daily.   VENTOLIN HFA 108 (90 Base) MCG/ACT inhaler INHALE UP TO  2 PUFFS BY MOUTH EVERY 4 HOURS AS NEEDED   Vitamin D, Ergocalciferol, (DRISDOL) 1.25 MG (50000 UNIT) CAPS capsule Take 1 capsule (50,000 Units total) by mouth every Sunday.     Allergies:   Shellfish allergy   Social History   Socioeconomic History   Marital status: Divorced    Spouse name: Not on file   Number of children: 2   Years of education: 12   Highest education level: Not on file  Occupational History   Occupation: Disabled  Tobacco Use   Smoking status: Every Day    Current packs/day: 1.00    Average packs/day: 1 pack/day for 44.0 years (44.0 ttl pk-yrs)    Types: Cigarettes   Smokeless tobacco: Never    Tobacco comments:    1ppd, verified by [facility] 05/26/2022  Vaping Use   Vaping status: Never Used  Substance and Sexual Activity   Alcohol use: No   Drug use: Never   Sexual activity: Not Currently  Other Topics Concern   Not on file  Social History Narrative   Not on file   Social Determinants of Health   Financial Resource Strain: Low Risk  (10/21/2021)   Overall Financial Resource Strain (CARDIA)    Difficulty of Paying Living Expenses: Not hard at all  Food Insecurity: No Food Insecurity (10/21/2021)   Hunger Vital Sign    Worried About Running Out of Food in the Last Year: Never true    Ran Out of Food in the  Last Year: Never true  Transportation Needs: No Transportation Needs (10/21/2021)   PRAPARE - Administrator, Civil Service (Medical): No    Lack of Transportation (Non-Medical): No  Physical Activity: Inactive (10/21/2021)   Exercise Vital Sign    Days of Exercise per Week: 0 days    Minutes of Exercise per Session: 0 min  Stress: No Stress Concern Present (10/21/2021)   [employer] of Occupational Health - Occupational Stress Questionnaire    Feeling of Stress : Only a little  Social Connections: Moderately Isolated (10/21/2021)   Social Connection and Isolation Panel [NHANES]    Frequency of Communication with Friends and Family: More than three times a week    Frequency of Social Gatherings with Friends and Family: Twice a week    Attends Religious Services: Never    Database administrator or Organizations: Yes    Attends Engineer, structural: Never    Marital Status: Divorced     Family History: The patient's *family history includes CAD in his father; COPD in his father; Heart Problems in his mother and another family member; Heart attack in his maternal grandmother, paternal grandfather, and paternal grandmother; Heart disease in his father and mother; Hypertension in his maternal grandmother, paternal grandfather, and paternal  grandmother; Other in his sister.  ROS:   Please see the history of present illness.    All other systems reviewed and are negative.  EKGs/Labs/Other Studies Reviewed:    EKG: Ordered today shows atrial paced, ventricular sensed rhythm with early repolarization but otherwise a normal tracing.  Recent Labs: Last lab work in 2019 showed creatinine of .78, potassium of 3.8, hemoglobin of 12.2.   Recent Lipid Panel    Component Value Date/Time   CHOL 151 08/02/2021 1407   TRIG 78.0 08/02/2021 1407   HDL 45.50 08/02/2021 1407   CHOLHDL 3 08/02/2021 1407   VLDL 15.6 08/02/2021 1407   LDLCALC 90 08/02/2021 1407   Physical Exam:    VS:  BP 112/72 (BP Location: Left Arm, Patient Position: Sitting, Cuff Size: Normal)   Pulse 85   Ht 5\' 9"  (1.753 m)   Wt 126 lb 12.8 oz (57.5 kg)   SpO2 95%   BMI 18.73 kg/m     Wt Readings from Last 3 Encounters:  08/18/22 126 lb 12.8 oz (57.5 kg)  07/19/22 127 lb 6.4 oz (57.8 kg)  06/28/22 124 lb (56.2 kg)     General: Alert, oriented x3, no distress, he is clearly underweight, borderline cachectic.  The left subclavian pacemaker site looks healthy. Head: no evidence of trauma, PERRL, EOMI, no exophtalmos or lid lag, no myxedema, no xanthelasma; normal ears, nose and oropharynx Neck: normal jugular venous pulsations and no hepatojugular reflux; brisk carotid pulses without delay and no carotid bruits Chest: Diminished sounds throughout and bilateral wheezing. Cardiovascular: normal position and quality of the apical impulse, regular rhythm, normal first and second heart sounds, no murmurs, rubs or gallops Abdomen: Slightly tender to palpation in the epigastrium. , no masses by palpation, no abnormal pulsatility or arterial bruits, normal bowel sounds, no hepatosplenomegaly Extremities: no clubbing, cyanosis or edema; 2+ radial, ulnar and brachial pulses bilaterally; 2+ right femoral, posterior tibial and dorsalis pedis pulses; 2+ left femoral,  posterior tibial and dorsalis pedis pulses; no subclavian or femoral bruits Neurological: grossly nonfocal Psych: Normal mood and affect    ASSESSMENT:    1. Sinus node dysfunction (HCC)   2. Paroxysmal atrial fibrillation (HCC)  3. PAT (paroxysmal atrial tachycardia)   4. COPD GOLD III    5. Cachexia (HCC)    PLAN:    In order of problems listed above:  Sinus Node Dysfunction:  Asymptomatic.  Good heart rate histogram on current settings Atrial Fibrillation: These episodes are very infrequent and brief.  In the last 15 months she has had a total of less than 20 minutes of atrial fibrillation.  No history of embolic events, [id number] score is 1 (HTN only, even this has resolved).  I do not think he should start anticoagulation.  Continue to monitor via his pacemaker. PAT: There is occasional palpitations, but otherwise his episodes are asymptomatic.  Is likely that the spells of atrial tachycardia are related to chronic lung disease and/or use of bronchodilators. COPD: His physical exam is consistent with emphysema, he has smoked more than 2 packs a day since his teenage years.  Has an appointment with Pulmonary in a week's time. History of hypertension: Resolved after substantial weight loss. Malnutrition/cachexia: He refers abdominal pain and chest discomfort shortly after eating.  On my review of his CT there is no significant atherosclerosis involving the major visceral arteries, and his symptoms sound more suggestive of gastric outlet obstruction rather than intestinal angina.  Concerned that he has gastric pathology such as a pyloric ulcer, but he is also at risk for GI malignancy due to longstanding history of smoking.  Has a GI appointment in a few days time.  Medication Adjustments/Labs and Tests Ordered: Current medicines are reviewed at length with the patient today.  Concerns regarding medicines are outlined above.  Orders Placed This Encounter  Procedures   EKG 12-Lead    No orders of the defined types were placed in this encounter.   Patient Instructions  Medication Instructions:  No changes *If you need a refill on your cardiac medications before your next appointment, please call your pharmacy*  Follow-Up: At [facility], you and your health needs are our priority.  As part of our continuing mission to provide you with exceptional heart care, we have created designated Provider Care Teams.  These Care Teams include your primary Cardiologist (physician) and Advanced Practice Providers (APPs -  Physician Assistants and Nurse Practitioners) who all work together to provide you with the care you need, when you need it.  We recommend signing up for the patient portal called "MyChart".  Sign up information is provided on this After Visit Summary.  MyChart is used to connect with patients for Virtual Visits (Telemedicine).  Patients are able to view lab/test results, encounter notes, upcoming appointments, etc.  Non-urgent messages can be sent to your provider as well.   To learn more about what you can do with MyChart, go to [email or web address].    Your next appointment:   1 year(s)  Provider:   Dr Royann Shivers

## 2022-08-18 NOTE — Patient Instructions (Signed)

## 2022-08-22 ENCOUNTER — Other Ambulatory Visit (INDEPENDENT_AMBULATORY_CARE_PROVIDER_SITE_OTHER): Payer: Medicare HMO

## 2022-08-22 ENCOUNTER — Telehealth: Payer: Self-pay

## 2022-08-22 ENCOUNTER — Ambulatory Visit: Payer: Medicare HMO | Admitting: Physician Assistant

## 2022-08-22 VITALS — BP 102/60 | HR 80 | Ht 69.0 in | Wt 130.0 lb

## 2022-08-22 DIAGNOSIS — J449 Chronic obstructive pulmonary disease, unspecified: Secondary | ICD-10-CM | POA: Diagnosis not present

## 2022-08-22 DIAGNOSIS — E0590 Thyrotoxicosis, unspecified without thyrotoxic crisis or storm: Secondary | ICD-10-CM | POA: Diagnosis not present

## 2022-08-22 DIAGNOSIS — R1319 Other dysphagia: Secondary | ICD-10-CM

## 2022-08-22 DIAGNOSIS — I480 Paroxysmal atrial fibrillation: Secondary | ICD-10-CM

## 2022-08-22 DIAGNOSIS — K921 Melena: Secondary | ICD-10-CM

## 2022-08-22 DIAGNOSIS — R1013 Epigastric pain: Secondary | ICD-10-CM | POA: Diagnosis not present

## 2022-08-22 DIAGNOSIS — K5904 Chronic idiopathic constipation: Secondary | ICD-10-CM | POA: Diagnosis not present

## 2022-08-22 DIAGNOSIS — E538 Deficiency of other specified B group vitamins: Secondary | ICD-10-CM

## 2022-08-22 DIAGNOSIS — K219 Gastro-esophageal reflux disease without esophagitis: Secondary | ICD-10-CM

## 2022-08-22 LAB — CBC WITH DIFFERENTIAL/PLATELET
Basophils Absolute: 0 10*3/uL (ref 0.0–0.1)
Basophils Relative: 0.2 % (ref 0.0–3.0)
Eosinophils Absolute: 0 10*3/uL (ref 0.0–0.7)
Eosinophils Relative: 0 % (ref 0.0–5.0)
HCT: 44.3 % (ref 39.0–52.0)
Hemoglobin: 14.6 g/dL (ref 13.0–17.0)
Lymphocytes Relative: 5.2 % — ABNORMAL LOW (ref 12.0–46.0)
Lymphs Abs: 0.5 10*3/uL — ABNORMAL LOW (ref 0.7–4.0)
MCHC: 32.9 g/dL (ref 30.0–36.0)
MCV: 101.9 fl — ABNORMAL HIGH (ref 78.0–100.0)
Monocytes Absolute: 0.3 10*3/uL (ref 0.1–1.0)
Monocytes Relative: 3.5 % (ref 3.0–12.0)
Neutro Abs: 8.8 10*3/uL — ABNORMAL HIGH (ref 1.4–7.7)
Neutrophils Relative %: 91.1 % — ABNORMAL HIGH (ref 43.0–77.0)
Platelets: 259 10*3/uL (ref 150.0–400.0)
RBC: 4.35 Mil/uL (ref 4.22–5.81)
RDW: 13.2 % (ref 11.5–15.5)
WBC: 9.6 10*3/uL (ref 4.0–10.5)

## 2022-08-22 LAB — TSH: TSH: 0.35 u[IU]/mL (ref 0.35–5.50)

## 2022-08-22 LAB — SEDIMENTATION RATE: Sed Rate: 7 mm/h (ref 0–20)

## 2022-08-22 MED ORDER — POLYETHYLENE GLYCOL 3350 17 G PO PACK: 17.0000 g | PACK | Freq: Two times a day (BID) | ORAL | 3 refills | Status: DC

## 2022-08-22 MED ORDER — NA SULFATE-K SULFATE-MG SULF 17.5-3.13-1.6 GM/177ML PO SOLN: 1.0000 | Freq: Once | ORAL | 0 refills | Status: AC

## 2022-08-22 MED ORDER — BENEFIBER DRINK MIX PO PACK: PACK | ORAL | 3 refills | Status: DC

## 2022-08-22 MED ORDER — OMEPRAZOLE 40 MG PO CPDR: 40.0000 mg | DELAYED_RELEASE_CAPSULE | Freq: Two times a day (BID) | ORAL | 1 refills | Status: DC

## 2022-08-22 NOTE — Progress Notes (Signed)
CT scan shows constipation.  This is probably causing his symptoms.  Recommend he start a stool softener such as Colace or MiraLAX 1-2 times daily.  He should be having 1-2 soft bowel movements daily.  He should let us know if symptoms do not improve with this.  He does have an enlarged prostate which we had discussed at previous office visits.  He also has some underlying emphysema as well as calcification in his aorta.  He needs to cut down on smoking as we have discussed previously however neither of these should cause any of his symptoms he is experiencing currently and we do not need to do any specific follow up for this.

## 2022-08-22 NOTE — Progress Notes (Signed)
08/22/2022 Michael [name] [id number] April 07, 1960  Referring provider: [name], MD Primary GI doctor: Dr. Leonides [name] ( sooner appointment)  ASSESSMENT AND PLAN:   Abdominal pain, epigastric with melena, dysphagia and weight loss in smoker with COPD Patient has been taking excessive ibuprofen and prednisone Declined rectal exam to evaluate for active bleeding Showed patient to stop ibuprofen, will increase Prilosec from 20 mg twice daily to 40 mg twice daily for at least 2 to 3 months Schedule EGD with colonoscopy in about a month with soonest Dr. Leonides [name], with patient's smoking history and some dysphagia we will plan for barium swallow to evaluate for mass/stricture in the meantime May benefit from modified barium swallow ER precautions discussed with patient and his mom I discussed risks of EGD with patient today, including risk of sedation, bleeding or perforation.  Patient provides understanding and gave verbal consent to proceed.  Chronic idiopathic constipation with hematochezia and weight loss 07/2013 colonoscopy unremarkable unable to see report supposedly due 07/2023 CT abdomen and pelvis with constipation otherwise benign abdomen Discussed with patient and his mother, with hematochezia, weight loss would suggest repeating colonoscopy with endoscopy. Will do 2-day prep with patient's constipation, start on MiraLAX daily with fiber I believe patient is appropriate for L[facility], has had cardiac clearance, will get medical clearance from Michael [name], if not appropriate for LEC will schedule at hospital We have discussed the risks of bleeding, infection, perforation, medication reactions, and remote risk of death associated with colonoscopy. All questions were answered and the patient acknowledges these risk and wishes to proceed.  COPD GOLD III  Not on oxygen, continues to smoke a pack a day, 50 pack year smoking history Recent exacerbation 06/22/2022 given Z-Pak and still on the  prednisone CT chest low-dose 07/06/2018 for benign appearance mild diffuse bronchial wall thickening severe emphysema Will follow up with Dr. Sherene [name] on 8/22, will get medical clearance for procedures at [facility]  Paroxysmal atrial fibrillation (HCC) Has pacemaker, CHADSVASC 1, only on bASA Has cardiac clearance  B12 deficiency Previously low normal, will recheck B12 with iron/ferritin  Hyperthyroidism with weight loss Last TSH 0.25 a year ago, will repeat TSH with free T3, could be contributing to weight loss   Patient Care Team: Michael Dark, MD as PCP - General (Family Medicine) Michael [name], Michael Hora, MD as Consulting Physician (Cardiology) Michael Cowden, MD as Consulting Physician (Pulmonary Disease)  HISTORY OF PRESENT ILLNESS: 62 y.o. male with a past medical history of COPD, tobacco use, sinus node dysfunction status post pacemaker, atrial fibrillation CHA2DS2-VASc 1 not on anticoagulation, GERD, nuclear stress test 2018 low risk study EF 49%, hypothyroidism, seizure disorder and others listed below presents for evaluation of postprandial abdominal discomfort with weight loss.[name]   07/09/2013 Colonoscopy screening, unremarkable, unable to see report 03/31/2022 Hgb 13.6, no leukocytosis.   Compared to 10/02/2021 patient did have an anemia Hgb 12.6, normal MCV of 97.4, hypokalemia 3.3, BUN was elevated at 23, creatinine 1.13, elevated sed rate and CRP at 35 and 8.8 but patient had wound infection and was in the hospital. 08/02/2021 B12 low normal at 256, thyroid 0.24 Previous liver functions  for 7 years very mildly elevated alk phos at 126 130 normal AST ALT total bilirubin. 07/06/2022 CT chest low-dose for nodule showed benign appearance mild diffuse bronchial wall thickening severe emphysema ascending thoracic aorta 4.1 cm 08/15/2022 CT abdomen pelvis with contrast for right lower quadrant abdominal discomfort showed distal esophagus grossly unremarkable, normal liver, status post cholecystectomy  normal pancreas normal spleen, normal stomach small bowel appendix and colon.  Did show large stool burden and constipation, enlarged prostate.  Patient follows with Dr. Sherene [name] for COPD Gold 3 last seen 05/26/2022 continues to smoke, not on maintenance medications, 06/22/2022 had COPD exacerbation given Z-Pak and prednisone.  Has follow up with Dr. Sherene [name] 08/22.  08/18/2022 office visit with Dr. Vickii [name] cardiologist, infrequent episodes of atrial fibrillation low CHA2DS2-VASc not on anticoagulation has upcoming appointment with pulmonary.  Mom, Michael [name], is here with him and provides some of the history.  He has been having issues with his stomach for years, but has gotten worse in the last year.  He has had weight loss secondary to bloating and epigastric AB pain after meals. Can have AB pain all the time. Feels he has air on his chest and stomach where he can not breath, makes it worse.  He has occ trouble with swallowing pills, will need to drink, occ with liquids will gag and have it go wrong way.  He has history of constipation, can alternate with diarrhea, feels like it is a vicious cycle where he will not have a BM and then diarrhea.  He has seen [name] black stools in the past, will occ take pepto, last saw [name] black stools today, denies recent pepto/iron.  He has rectal pain with wiping and can have pain with wiping, can have BRB on TP and in toilet after he uses rest room several times. Can have rectal burping/itching.  He has been on the omeprazole 20 mg twice a day. Denies GERD as long as he is on the pills.  He is still on prednisone 10 mg since May.  He is not on oxygen at night or at home.  He continues to smoke a pack a day, he has 50 pack year smoking history.   Wt Readings from Last 3 Encounters:  08/22/22 130 lb (59 kg)  08/18/22 126 lb 12.8 oz (57.5 kg)  07/19/22 127 lb 6.4 oz (57.8 kg)   He reports NSAID use,Ibuprofen 200 mg 5 pills at a time, can take up to 3 x a day, was taking  more frequently/daily but now taking as needed but at least 3-4 x a week, took it last week.  He denies ETOH use.   He denies drug use.    He  reports that he has been smoking cigarettes. He has a 44 pack-year smoking history. He has never used smokeless tobacco. He reports that he does not drink alcohol and does not use drugs.  RELEVANT LABS AND IMAGING: CBC    Component Value Date/Time   WBC 7.8 03/31/2022 1223   RBC 4.06 (L) 03/31/2022 1223   HGB 13.6 03/31/2022 1223   HCT 39.9 03/31/2022 1223   PLT 255.0 03/31/2022 1223   MCV 98.1 03/31/2022 1223   MCH 33.3 10/02/2021 0429   MCHC 34.2 03/31/2022 1223   RDW 13.4 03/31/2022 1223   LYMPHSABS 1.8 03/31/2022 1223   MONOABS 0.7 03/31/2022 1223   EOSABS 0.2 03/31/2022 1223   BASOSABS 0.1 03/31/2022 1223   Recent Labs    10/01/21 1413 10/02/21 0429 03/31/22 1223  HGB 14.2 12.6* 13.6    CMP     Component Value Date/Time   NA 136 03/31/2022 1223   K 4.2 03/31/2022 1223   CL 102 03/31/2022 1223   CO2 30 03/31/2022 1223   GLUCOSE 75 03/31/2022 1223   BUN 19 03/31/2022 1223   CREATININE 0.86 03/31/2022 1223  CREATININE 1.03 07/28/2015 1202   CALCIUM 9.5 03/31/2022 1223   PROT 6.8 08/02/2021 1407   ALBUMIN 4.2 08/02/2021 1407   AST 19 08/02/2021 1407   ALT 12 08/02/2021 1407   ALKPHOS 98 08/02/2021 1407   BILITOT 0.6 08/02/2021 1407   GFRNONAA >60 10/02/2021 0429   GFRNONAA 82 07/28/2015 1202   GFRAA >60 03/18/2017 0254   GFRAA >89 07/28/2015 1202      Latest Ref Rng & Units 08/02/2021    2:07 PM 01/27/2020    2:53 PM 11/11/2019    2:56 PM  Hepatic Function  Total Protein 6.0 - 8.3 g/dL 6.8  7.1  6.7   Albumin 3.5 - 5.2 g/dL 4.2  4.3  4.3   AST 0 - 37 U/L 19  13  13    ALT 0 - 53 U/L 12  8  9    Alk Phosphatase 39 - 117 U/L 98  112  130   Total Bilirubin 0.2 - 1.2 mg/dL 0.6  0.3  0.7       Current Medications:   Current Outpatient Medications (Endocrine & Metabolic):    predniSONE (DELTASONE) 10 MG tablet, 2  until better then 1 daily  Current Outpatient Medications (Cardiovascular):    EPINEPHrine 0.3 mg/0.3 mL IJ SOAJ injection, Inject 0.3 mg into the muscle as needed for anaphylaxis.  Current Outpatient Medications (Respiratory):    albuterol (ACCUNEB) 1.25 MG/3ML nebulizer solution, 2.5 mg every 4 hours as needed for cough, shortness of breath and/or wheezing   fluticasone (FLONASE) 50 MCG/ACT nasal spray, Place 2 sprays into both nostrils daily. Spray upward and outward.   Tiotropium Bromide-Olodaterol (STIOLTO RESPIMAT) 2.5-2.5 MCG/ACT AERS, Inhale 2 puffs into the lungs daily.   VENTOLIN HFA 108 (90 Base) MCG/ACT inhaler, INHALE UP TO  2 PUFFS BY MOUTH EVERY 4 HOURS AS NEEDED  Current Outpatient Medications (Analgesics):    aspirin EC 81 MG tablet, Take 81 mg by mouth daily. Swallow whole.   Current Outpatient Medications (Other):    FLUoxetine (PROZAC) 40 MG capsule, Take 2 capsules (80 mg total) by mouth daily.   ondansetron (ZOFRAN) 4 MG tablet, Take 1 tablet (4 mg total) by mouth every 8 (eight) hours as needed for nausea or vomiting.   tamsulosin (FLOMAX) 0.4 MG CAPS capsule, Take 1 capsule (0.4 mg total) by mouth daily.   Vitamin D, Ergocalciferol, (DRISDOL) 1.25 MG (50000 UNIT) CAPS capsule, Take 1 capsule (50,000 Units total) by mouth every Sunday.   omeprazole (PRILOSEC) 40 MG capsule, Take 1 capsule (40 mg total) by mouth 2 (two) times daily before a meal.  Medical History:  Past Medical History:  Diagnosis Date   Allergy    Anginal pain (HCC)    Anxiety    Arthritis    Bradycardia    CHF (congestive heart failure) (HCC)    Chronic chest pain    COPD (chronic obstructive pulmonary disease) (HCC)    Depression    Emphysema of lung (HCC)    GERD (gastroesophageal reflux disease)    History of nuclear stress test 06/14/2011   lexiscan; mild perfusion defect in basal inferosetpal & apical inferior region; negative for ischemia    Hypothyroidism    Pacemaker    Pneumonia     Presence of permanent cardiac pacemaker April 2005   For bradycardia   Seizure disorder (HCC)    Seizures (HCC)    as a baby   Shortness of breath    Tobacco abuse    Allergies:  Allergies  Allergen Reactions   Shellfish Allergy Anaphylaxis     Surgical History:  He  has a past surgical history that includes Pacemaker insertion (04/23/2003); Cardiac catheterization (2002, 2003); transthoracic echocardiogram (05/14/2012); Hand surgery (1998); Eye surgery ([age] y.o.); Gallbladder surgery (2003); Permanent pacemaker generator change (N/A, 09/06/2012); Tonsillectomy; and Open reduction internal fixation (orif) distal radial fracture (Right, 09/07/2016). Family History:  His family history includes CAD in his father; COPD in his father; Heart Problems in his mother and another family member; Heart attack in his maternal grandmother, paternal grandfather, and paternal grandmother; Heart disease in his father and mother; Hypertension in his maternal grandmother, paternal grandfather, and paternal grandmother; Other in his sister.  REVIEW OF SYSTEMS  : All other systems reviewed and negative except where noted in the History of Present Illness.  PHYSICAL EXAM: BP 102/60   Pulse 80   Ht 5\' 9"  (1.753 m)   Wt 130 lb (59 kg)   SpO2 96%   BMI 19.20 kg/m  General Appearance: Thin appearing chronically ill male without acute distress Head:   Normocephalic and atraumatic. Eyes:  sclerae anicteric,conjunctive pink  Respiratory: Diffuse decreased breath sounds, mild bilateral expiratory wheeze, barrel chest Cardio: RRR with no MRGs. Peripheral pulses intact.  Abdomen: Soft,  Flat ,active bowel sounds. mild tenderness in the epigastrium and in the LLQ. Without guarding and Without rebound. No masses. Rectal: declines Musculoskeletal: Full ROM, Normal gait. Without edema. Skin:  Dry and intact without significant lesions or rashes Neuro: Alert and  oriented x4;  No focal deficits. Psych:   Cooperative. Normal mood and affect.  Some delayed responses to questions, possible cognitive issues.   Doree Albee, PA-C 2:41 PM

## 2022-08-22 NOTE — Telephone Encounter (Signed)
.  CL

## 2022-08-22 NOTE — Patient Instructions (Signed)
We have sent the following medications to your pharmacy for you to pick up at your convenience: PRILOSEC SUPREP( please pick up atleast 3 days from this appointment)   You have been scheduled for a Barium Esophogram at [facility] (1st floor of the hospital) on 08/26/2022 at 9:00am. Please arrive 30 minutes prior to your appointment for registration. Make certain not to have anything to eat or drink 3 hours prior to your test. If you need to reschedule for any reason, please contact radiology at [phone] to do so. __________________________________________________________________ A barium swallow is an examination that concentrates on views of the esophagus. This tends to be a double contrast exam (barium and two liquids which, when combined, create a gas to distend the wall of the oesophagus) or single contrast (non-ionic iodine based). The study is usually tailored to your symptoms so a good history is essential. Attention is paid during the study to the form, structure and configuration of the esophagus, looking for functional disorders (such as aspiration, dysphagia, achalasia, motility and reflux) EXAMINATION You may be asked to change into a gown, depending on the type of swallow being performed. A radiologist and radiographer will perform the procedure. The radiologist will advise you of the type of contrast selected for your procedure and direct you during the exam. You will be asked to stand, sit or lie in several different positions and to hold a small amount of fluid in your mouth before being asked to swallow while the imaging is performed .In some instances you may be asked to swallow barium coated marshmallows to assess the motility of a solid food bolus. The exam can be recorded as a digital or video fluoroscopy procedure. POST PROCEDURE It will take 1-2 days for the barium to pass through your system. To facilitate this, it is important, unless otherwise directed, to increase  your fluids for the next 24-48hrs and to resume your normal diet.  This test typically takes about 30 minutes to perform. __________________________________________________________________________________   Bonita Quin have been scheduled for a colonoscopy/egd. Please follow written instructions given to you at your visit today.   Please pick up your prep supplies at the pharmacy within the next 1-3 days.  If you use inhalers (even only as needed), please bring them with you on the day of your procedure.  DO NOT TAKE 7 DAYS PRIOR TO TEST- Trulicity (dulaglutide) Ozempic, Wegovy (semaglutide) Mounjaro (tirzepatide) Bydureon Bcise (exanatide extended release)  DO NOT TAKE 1 DAY PRIOR TO YOUR TEST Rybelsus (semaglutide) Adlyxin (lixisenatide) Victoza (liraglutide) Byetta (exanatide) ___________________________________________________________________________   Your provider has requested that you go to the basement level for lab work before leaving today. Press "B" on the elevator. The lab is located at the first door on the left as you exit the elevator.  Miralax is an osmotic laxative.  It only brings more water into the stool.  This is safe to take daily.  Can take up to 17 gram of miralax twice a day.  Mix with juice or coffee.  Start 1 capful at night for 3-4 days and reassess your response in 3-4 days.  You can increase and decrease the dose based on your response.  Remember, it can take up to 3-4 days to take effect OR for the effects to wear off.   I often pair this with benefiber in the morning to help assure the stool is not too loose.   Toileting tips to help with your constipation - Drink at least 64-80 ounces  of water/liquid per day. - Establish a time to try to move your bowels every day.  For many people, this is after a cup of coffee or after a meal such as breakfast. - Sit all of the way back on the toilet keeping your back fairly straight and while sitting up, try to  rest the tops of your forearms on your upper thighs.   - Raising your feet with a step stool/squatty potty can be helpful to improve the angle that allows your stool to pass through the rectum. - Relax the rectum feeling it bulge toward the toilet water.  If you feel your rectum raising toward your body, you are contracting rather than relaxing. - Breathe in and slowly exhale. "Belly breath" by expanding your belly towards your belly button. Keep belly expanded as you gently direct pressure down and back to the anus.  A low pitched GRRR sound can assist with increasing intra-abdominal pressure.  (Can also trying to blow on a pinwheel and make it move, this helps with the same belly breathing) - Repeat 3-4 times. If unsuccessful, contract the pelvic floor to restore normal tone and get off the toilet.  Avoid excessive straining. - To reduce excessive wiping by teaching your anus to normally contract, place hands on outer aspect of knees and resist knee movement outward.  Hold 5-10 second then place hands just inside of knees and resist inward movement of knees.  Hold 5 seconds.  Repeat a few times each way.  Go to the ER if unable to pass gas, severe AB pain, unable to hold down food, any shortness of breath of chest pain.  Please take your proton pump inhibitor medication, increase omeprazole 40 mg twice a day for at least 2 months  Please take this medication 30 minutes to 1 hour before meals- this makes it more effective.  Avoid spicy and acidic foods Avoid fatty foods Limit your intake of coffee, tea, alcohol, and carbonated drinks Work to maintain a healthy weight Keep the head of the bed elevated at least 3 inches with blocks or a wedge pillow if you are having any nighttime symptoms Stay upright for 2 hours after eating Avoid meals and snacks three to four hours before bedtime Stop smoking  B12 is mainly in meat, so increase meat can help this but often people have a deficiency that they  are just not absorbing it well with meat or pills, so get the sublingual/melt in your mouth one.   If the sublingual one dose not increase your level, we will discuss shots.   Vitamin B12 Deficiency Vitamin B12 deficiency occurs when the body does not have enough vitamin B12, which is an important vitamin. The body needs this vitamin: To make red blood cells. To make DNA. This is the genetic material inside cells. To help the nerves work properly so they can carry messages from the brain to the body. Vitamin B12 deficiency can cause various health problems, such as a low red blood cell count (anemia) or nerve damage. What are the causes? This condition may be caused by: Not eating enough foods that contain vitamin B12. Not having enough stomach acid and digestive fluids to properly absorb vitamin B12 from the food that you eat. Certain digestive system diseases that make it hard to absorb vitamin B12. These diseases include Crohn's disease, chronic pancreatitis, and cystic fibrosis. A condition in which the body does not make enough of a protein (intrinsic factor), resulting in too few red  blood cells (pernicious anemia). Having a surgery in which part of the stomach or small intestine is removed. Taking certain medicines that make it hard for the body to absorb vitamin B12. These medicines include: Heartburn medicines (antacids and proton pump inhibitors). Certain antibiotic medicines. Some medicines that are used to treat diabetes, tuberculosis, gout, or high cholesterol. What increases the risk? The following factors may make you more likely to develop a B12 deficiency: Being older than age 30. Eating a vegetarian or vegan diet, especially while you are pregnant. Eating a poor diet while you are pregnant. Taking certain medicines. Having alcoholism. What are the signs or symptoms? In some cases, there are no symptoms of this condition. If the condition leads to anemia or nerve damage,  various symptoms can occur, such as: Weakness. Fatigue. Loss of appetite. Weight loss. Numbness or tingling in your hands and feet. Redness and burning of the tongue. Confusion or memory problems. Depression. Sensory problems, such as color blindness, ringing in the ears, or loss of taste. Diarrhea or constipation. Trouble walking. If anemia is severe, symptoms can include: Shortness of breath. Dizziness. Rapid heart rate (tachycardia). How is this diagnosed? This condition may be diagnosed with a blood test to measure the level of vitamin B12 in your blood. You may also have other tests, including: A group of tests that measure certain characteristics of blood cells (complete blood count, CBC). A blood test to measure intrinsic factor. A procedure where a thin tube with a camera on the end is used to look into your stomach or intestines (endoscopy). Other tests may be needed to discover the cause of B12 deficiency. How is this treated? Treatment for this condition depends on the cause. This condition may be treated by: Changing your eating and drinking habits, such as: Eating more foods that contain vitamin B12. Drinking less alcohol or no alcohol. Getting vitamin B12 injections. Taking vitamin B12 supplements. Your health care provider will tell you which dosage is best for you. Follow these instructions at home: Eating and drinking  Eat lots of healthy foods that contain vitamin B12, including: Meats and poultry. This includes beef, pork, chicken, [place], and organ meats, such as liver. Seafood. This includes clams, rainbow trout, salmon, tuna, and haddock. Eggs. Cereal and dairy products that are fortified. This means that vitamin B12 has been added to the food. Check the label on the package to see if the food is fortified. The items listed above may not be a complete list of recommended foods and beverages. Contact a dietitian for more information. General  instructions Get any injections that are prescribed by your health care provider. Take supplements only as told by your health care provider. Follow the directions carefully. Do not drink alcohol if your health care provider tells you not to. In some cases, you may only be asked to limit alcohol use. Keep all follow-up visits as told by your health care provider. This is important. Contact a health care provider if: Your symptoms come back. Get help right away if you: Develop shortness of breath. Have a rapid heart rate. Have chest pain. Become dizzy or lose consciousness. Summary Vitamin B12 deficiency occurs when the body does not have enough vitamin B12. The main causes of vitamin B12 deficiency include dietary deficiency, digestive diseases, pernicious anemia, and having a surgery in which part of the stomach or small intestine is removed. In some cases, there are no symptoms of this condition. If the condition leads to anemia or nerve  damage, various symptoms can occur, such as weakness, shortness of breath, and numbness. Treatment may include getting vitamin B12 injections or taking vitamin B12 supplements. Eat lots of healthy foods that contain vitamin B12. This information is not intended to replace advice given to you by your health care provider. Make sure you discuss any questions you have with your health care provider. Document Revised: 06/08/2018 Document Reviewed: 08/29/2017 Elsevier Patient Education  2020 [employer].  _______________________________________________________  If your blood pressure at your visit was 140/90 or greater, please contact your primary care physician to follow up on this.  _______________________________________________________  If you are age 75 or older, your body mass index should be between 23-30. Your Body mass index is 19.2 kg/m. If this is out of the aforementioned range listed, please consider follow up with your Primary Care  Provider.  If you are age 94 or younger, your body mass index should be between 19-25. Your Body mass index is 19.2 kg/m. If this is out of the aformentioned range listed, please consider follow up with your Primary Care Provider.   ________________________________________________________  The [facility] providers would like to encourage you to use [facility] to communicate with providers for non-urgent requests or questions.  Due to long hold times on the telephone, sending your provider a message by [facility] may be a faster and more efficient way to get a response.  Please allow 48 business hours for a response.  Please remember that this is for non-urgent requests.  _______________________________________________________ It was a pleasure to see you today!  Thank you for trusting me with your gastrointestinal care!

## 2022-08-23 ENCOUNTER — Other Ambulatory Visit: Payer: Self-pay | Admitting: Nurse Practitioner

## 2022-08-23 DIAGNOSIS — J209 Acute bronchitis, unspecified: Secondary | ICD-10-CM

## 2022-08-23 LAB — COMPREHENSIVE METABOLIC PANEL
ALT: 22 U/L (ref 0–53)
AST: 19 U/L (ref 0–37)
Albumin: 4 g/dL (ref 3.5–5.2)
Alkaline Phosphatase: 84 U/L (ref 39–117)
BUN: 17 mg/dL (ref 6–23)
CO2: 29 mEq/L (ref 19–32)
Calcium: 9.2 mg/dL (ref 8.4–10.5)
Chloride: 104 meq/L (ref 96–112)
Creatinine, Ser: 1.06 mg/dL (ref 0.40–1.50)
GFR: 75.68 mL/min (ref >60.00)
Glucose, Bld: 151 mg/dL — ABNORMAL HIGH (ref 70–99)
Potassium: 4.6 meq/L (ref 3.5–5.1)
Sodium: 140 mEq/L (ref 135–145)
Total Bilirubin: 0.3 mg/dL (ref 0.2–1.2)
Total Protein: 6.5 g/dL (ref 6.0–8.3)

## 2022-08-23 LAB — IBC + FERRITIN
Ferritin: 29.3 ng/mL (ref 22.0–322.0)
Iron: 123 ug/dL (ref 42–165)
Saturation Ratios: 34.9 % (ref 20.0–50.0)
TIBC: 352.8 ug/dL (ref 250.0–450.0)
Transferrin: 252 mg/dL (ref 212.0–360.0)

## 2022-08-23 LAB — VITAMIN B12: Vitamin B-12: 363 pg/mL (ref 211–911)

## 2022-08-23 LAB — T3, FREE: T3, Free: 3 pg/mL (ref 2.3–4.2)

## 2022-08-23 LAB — TISSUE TRANSGLUTAMINASE, IGA: (tTG) Ab, IgA: <1 U/mL

## 2022-08-23 LAB — IGA: Immunoglobulin A: 314 mg/dL (ref 70–320)

## 2022-08-24 NOTE — Progress Notes (Signed)
I agree with the assessment and plan as outlined by Ms. Collier. 

## 2022-08-24 NOTE — Progress Notes (Unsigned)
Subjective:     Patient ID: Michael [name], male   DOB: 10/24/1960      MRN: [id number]    Brief patient profile:  69 yowm MM/active smoker referred to pulmonary clinic 12/08/2015 by Dr  Ilsa Iha with GOLD III copd documented 08/19/13     History of Present Illness  10/11/2017  f/u ov/Michael [name] re: COPD III/ still smoking maint on symb/ spiriva dpi  Chief Complaint  Patient presents with   Follow-up    Breathing "depends on the heat outside"- he is unsure of meds and states "needs refills on everything".    Dyspnea:  MMRC3 = can't walk 100 yards even at a slow pace at a flat grade s stopping due to sob   Cough: provoked  with active smoking and ? From dpi spiriva also/ min mucoid  Sleeping: bed flat with a couple of pillows  SABA use: way too much when weather hot  02: none  rec Change spiriva to Respimat form  and use 2 pffs first thing in am only to follow the symbicort x 2 puffs only in the am, then take the other 2 pffs of symbicort 12 hours later  Work on inhaler technique:  The key is to stop smoking completely before smoking completely stops you!  Please schedule a follow up visit in 3 months but call sooner if needed  with all medications      05/26/2022  f/u ov/[place] office/Michael [name] re: GOLD 3 copd maint on symbicort /spiriva/ pred 10 mg daily   still smoking Chief Complaint  Patient presents with   Follow-up    Pt f/u states that he is still wheezing in the morning and having a productive cough w/ ranging colors. Currently smoking 1ppd - requesting refills on all inhalers along w/ flonase, omeprazole &   Dyspnea:  walking around property slow pace  Cough: none in the last week  prior to OV   Sleeping: couch propped up on arm and pillow SABA use: one or twice every night / neb once at night  02: none  Rec Pepcid 20 mg after 1st and last meals GERD diet reviewed, bed blocks rec  Plan A = Automatic = Always=    stiolto 2 puffs each am and prednisone 10 mg 2 daily until better  then one daily  Work on inhaler technique: >>>  Remember how golfers warm up by taking practice swings - do this with an empty inhaler  Plan B = Backup (to supplement plan A, not to replace it) Only use your albuterol inhaler as a rescue medication  Plan C = Crisis (instead of Plan B but only if Plan B stops working) - only use your albuterol nebulizer if you first try Plan B and it fails to help > ok to use the nebulizer up to every 4 hours but if start needing it regularly call for immediate appointment    08/25/2022 3 m  f/u ov/[place] office/Michael [name] re: GOLD 3 copd maint on Stiolto  / pred 20 mg until better, then 10 mg maint  Chief Complaint  Patient presents with   COPD    Gold 3  Dyspnea:  slow pace walking  Cough: bad noct x month variably colored  Sleeping: x months has breathing attacks nightly of coughing/sob calm down p an hour, has never tried neb saba   SABA use: hfa only  02: none     No obvious day to day or daytime variability or assoc  mucus plugs or hemoptysis or cp or chest tightness, subjective wheeze or overt sinus or hb symptoms.    Also denies any obvious fluctuation of symptoms with weather or environmental changes or other aggravating or alleviating factors except as outlined above   No unusual exposure hx or h/o childhood pna/ asthma or knowledge of premature birth.  Current Allergies, Complete Past Medical History, Past Surgical History, Family History, and Social History were reviewed in [employer] record.  ROS  The following are not active complaints unless bolded Hoarseness, sore throat, dysphagia, dental problems, itching, sneezing,  nasal congestion or discharge of excess mucus or purulent secretions, ear ache,   fever, chills, sweats, unintended wt loss or wt gain, classically pleuritic or exertional cp,  orthopnea pnd or arm/hand swelling  or leg swelling, presyncope, palpitations, abdominal pain, anorexia, nausea, vomiting,  diarrhea  or change in bowel habits or change in bladder habits, change in stools or change in urine, dysuria, hematuria,  rash, arthralgias, visual complaints, headache, numbness, weakness or ataxia or problems with walking or coordination,  change in mood or  memory.        Current Meds  Medication Sig   albuterol (ACCUNEB) 1.25 MG/3ML nebulizer solution 2.5 mg every 4 hours as needed for cough, shortness of breath and/or wheezing   aspirin EC 81 MG tablet Take 81 mg by mouth daily. Swallow whole.   benzonatate (TESSALON) 200 MG capsule Take 1 capsule by mouth three times daily as needed   EPINEPHrine 0.3 mg/0.3 mL IJ SOAJ injection Inject 0.3 mg into the muscle as needed for anaphylaxis.   FLUoxetine (PROZAC) 40 MG capsule Take 2 capsules (80 mg total) by mouth daily.   fluticasone (FLONASE) 50 MCG/ACT nasal spray Place 2 sprays into both nostrils daily. Spray upward and outward.   Na Sulfate-K Sulfate-Mg Sulf 17.5-3.13-1.6 GM/177ML SOLN Take by mouth as directed.   omeprazole (PRILOSEC) 40 MG capsule Take 1 capsule (40 mg total) by mouth 2 (two) times daily before a meal.   ondansetron (ZOFRAN) 4 MG tablet Take 1 tablet (4 mg total) by mouth every 8 (eight) hours as needed for nausea or vomiting.   polyethylene glycol (MIRALAX / GLYCOLAX) 17 g packet Take 17 g by mouth 2 (two) times daily.   predniSONE (DELTASONE) 10 MG tablet 2 until better then 1 daily   tamsulosin (FLOMAX) 0.4 MG CAPS capsule Take 1 capsule (0.4 mg total) by mouth daily.   Tiotropium Bromide-Olodaterol (STIOLTO RESPIMAT) 2.5-2.5 MCG/ACT AERS Inhale 2 puffs into the lungs daily.   VENTOLIN HFA 108 (90 Base) MCG/ACT inhaler INHALE UP TO  2 PUFFS BY MOUTH EVERY 4 HOURS AS NEEDED   Vitamin D, Ergocalciferol, (DRISDOL) 1.25 MG (50000 UNIT) CAPS capsule Take 1 capsule (50,000 Units total) by mouth every Sunday.   Wheat Dextrin (BENEFIBER DRINK MIX) PACK Once daily use benefier with miralax                       Objective:   Physical Exam  Wts  08/25/2022      126   05/26/2022      125  10/13/2021       133  04/15/2021         136  10/06/2020         135  10/04/2019         126  02/19/2019         13 7  01/12/2018  137  10/11/2017       131  07/11/2017         125  01/11/2017         139  07/11/2016         144   04/11/2016        147          12/08/15 157 lb 3.2 oz (71.3 kg)  11/04/15 153 lb 12.8 oz (69.8 kg)  08/25/15 160 lb 12.8 oz (72.9 kg)    Vital signs reviewed  08/25/2022  - Note at rest 02 sats  95% on RA   General appearance:    clear    HEENT :  Oropharynx  clear      NECK :  without JVD/Nodes/TM/ nl carotid upstrokes bilaterally   LUNGS: no acc muscle use,  Mod barrel  contour chest wall with bilateral  Distant bs s audible wheeze and  without cough on insp or exp maneuvers and mod  Hyperresonant  to  percussion bilaterally     CV:  RRR  no s3 or murmur or increase in P2, and no edema   ABD:  soft and nontender with pos mid insp Hoover's  in the supine position. No bruits or organomegaly appreciated, bowel sounds nl  MS:   Ext warm without deformities or   obvious joint restrictions , calf tenderness, cyanosis or clubbing  SKIN: warm and dry without lesions    NEURO:  alert, approp, nl sensorium with  no motor or cerebellar deficits apparent.               I personally reviewed images and agree with radiology impression as follows:   Chest LDSCT     07/06/22      Mild diffuse bronchial wall thickening with severe centrilobular and paraseptal emphysema        Assessment:

## 2022-08-25 ENCOUNTER — Encounter: Payer: Self-pay | Admitting: Internal Medicine

## 2022-08-25 ENCOUNTER — Other Ambulatory Visit: Payer: Self-pay | Admitting: *Deleted

## 2022-08-25 ENCOUNTER — Ambulatory Visit: Payer: Medicare HMO | Admitting: Internal Medicine

## 2022-08-25 VITALS — BP 108/72 | HR 73 | Ht 69.0 in | Wt 126.0 lb

## 2022-08-25 DIAGNOSIS — F17210 Nicotine dependence, cigarettes, uncomplicated: Secondary | ICD-10-CM | POA: Diagnosis not present

## 2022-08-25 DIAGNOSIS — Z122 Encounter for screening for malignant neoplasm of respiratory organs: Secondary | ICD-10-CM

## 2022-08-25 DIAGNOSIS — J449 Chronic obstructive pulmonary disease, unspecified: Secondary | ICD-10-CM

## 2022-08-25 DIAGNOSIS — J9612 Chronic respiratory failure with hypercapnia: Secondary | ICD-10-CM

## 2022-08-25 DIAGNOSIS — J157 Pneumonia due to Mycoplasma pneumoniae: Secondary | ICD-10-CM | POA: Insufficient documentation

## 2022-08-25 DIAGNOSIS — Z87891 Personal history of nicotine dependence: Secondary | ICD-10-CM

## 2022-08-25 MED ORDER — VENTOLIN HFA 108 (90 BASE) MCG/ACT IN AERS: INHALATION_SPRAY | RESPIRATORY_TRACT | 3 refills | Status: DC

## 2022-08-25 MED ORDER — ALBUTEROL SULFATE 1.25 MG/3ML IN NEBU: INHALATION_SOLUTION | RESPIRATORY_TRACT | 11 refills | Status: DC

## 2022-08-25 NOTE — Assessment & Plan Note (Signed)
Active smoker - reports quit smokng 06/2013> resumed by ov 04/11/2016   - Spirometry 08/19/2013  FEV1 1.85 ( 47%) ratio 44  - Spirometry 12/08/2015   FEV1 1.53 (43%)  Ratio 40 off maint rx  - 12/08/2015  After extensive coaching HFA effectiveness =    75% > start symb 160/atrovent qid   - Allergy profile 12/08/15  >  Eos 0.0 /  IgE  347  RAST pos dust/ mold - [employer] [facility] AT screen 12/08/15 > MM/ levels ok   - 10/11/2017    changed to spiriva smi   - 05/26/2022  After extensive coaching inhaler device,  effectiveness =  80% from baseline of 50%with smi and severe cough on insp hfa  > try stiolto 2 each am and pred 20 until better then 10 mg daily    Group D (now reclassified as E) in terms of symptom/risk and laba/lama/ICS  therefore appropriate rx at this point >>>  stiolto and pred 20 until better then 10 mg daily

## 2022-08-25 NOTE — Assessment & Plan Note (Signed)
Counseled re importance of smoking cessation but did not meet time criteria for separate billing            Each maintenance medication was reviewed in detail including emphasizing most importantly the difference between maintenance and prns and under what circumstances the prns are to be triggered using an action plan format where appropriate.  Total time for H and P, chart review, counseling, reviewing hfa/neb device(s) and generating customized AVS unique to this office visit / same day charting > 30 min       

## 2022-08-25 NOTE — Patient Instructions (Signed)
The key is to stop smoking completely before smoking completely stops you!  My office will be contacting you by phone for referral for overnight oxygen level   - if you don't hear back from my office within one week please call us back or notify us thru MyChart and we'll address it right away.   No change in recommendations   Please schedule a follow up visit in 3 months but call sooner if needed

## 2022-08-25 NOTE — Assessment & Plan Note (Signed)
HCO3 12/08/2015 = 34  HC03   03/18/17    = 27 -   10/04/2019   Walked RA  3 laps @ approx 21ft each @ nl pace  stopped due to end of study,  Mild sob at end with sats still 95%  - HC03  01/27/20  = 32  - HC03   03/31/22 = 30  - HC03   08/22/22 = 29  -  ONO RA 08/25/2022 >>>   Noct spells may be hypoxemia or panic so   1) ONO on RA 2) add neb as instructed for any "spell" that doesn't improve with saba

## 2022-08-26 ENCOUNTER — Encounter: Payer: Self-pay | Admitting: Cardiovascular Disease

## 2022-08-26 ENCOUNTER — Ambulatory Visit (HOSPITAL_COMMUNITY)
Admission: RE | Admit: 2022-08-26 | Discharge: 2022-08-26 | Disposition: A | Discharge status: Home / Self Care | Payer: Medicare HMO | Source: Ambulatory Visit | Attending: Physician Assistant | Admitting: Physician Assistant

## 2022-08-26 ENCOUNTER — Encounter: Payer: Medicare HMO | Admitting: Family Medicine

## 2022-08-26 ENCOUNTER — Telehealth: Payer: Self-pay

## 2022-08-26 DIAGNOSIS — R1319 Other dysphagia: Secondary | ICD-10-CM | POA: Insufficient documentation

## 2022-08-26 DIAGNOSIS — R1013 Epigastric pain: Secondary | ICD-10-CM | POA: Insufficient documentation

## 2022-08-26 DIAGNOSIS — R1310 Dysphagia, unspecified: Secondary | ICD-10-CM | POA: Diagnosis not present

## 2022-08-26 NOTE — Progress Notes (Addendum)
PERIOPERATIVE PRESCRIPTION FOR IMPLANTED CARDIAC DEVICE PROGRAMMING   Patient Information:  Patient: Michael [name]  MRN: [id number]  Date of Birth: May 26, 1960      Planned Procedure:  EGD and Colonoscopy  Surgeon:  [name] Date of Procedure:  09/14/2022  Practice name and name of physician performing surgery? [facility] What is your office phone and fax number? Phone- [phone] Fax- [phone]    Device Information:   Clinic EP Physician:   Dr. [name] Croitoru Device Type:  Pacemaker Manufacturer and Phone #:  Medtronic: [phone] Pacemaker Dependent?:  No Date of Last Device Check:  06/08/2022        Normal Device Function?:  Yes     Electrophysiologist's Recommendations:   Have magnet available. Provide continuous ECG monitoring when magnet is used or reprogramming is to be performed.  Procedure may interfere with device function.  Magnet should be placed over device during procedure.  Per Device Clinic Standing Orders, Lenor Coffin  08/26/2022 2:21 PM

## 2022-08-26 NOTE — Telephone Encounter (Addendum)
   Patient Name: Michael [name]  DOB: Nov 28, 1960 MRN: [id number]  Primary Cardiologist: None  Chart reviewed as part of pre-operative protocol coverage. Given past medical history and time since last visit, based on ACC/AHA guidelines, per Dr. Royann Shivers will [name] is at acceptable risk for the planned procedure without further cardiovascular testing.  Patient is not pacemaker dependent and request will be forwarded to device clinic for recommendations and management during procedure.  Patient can hold aspirin 5 to 7 days prior to procedure and should restart postprocedure when surgically safe and hemostasis is achieved.   I will route this recommendation to the requesting party via Epic fax function and remove from pre-op pool.  Please call with questions.  Napoleon Form, Leodis Rains, NP 08/26/2022, 1:09 PM

## 2022-08-26 NOTE — Telephone Encounter (Signed)
Cleared for GI procedures

## 2022-08-26 NOTE — Telephone Encounter (Signed)
Pt not on anticoagulation. This clearance is for medical

## 2022-08-26 NOTE — Telephone Encounter (Signed)
[facility] Pre-operative Risk Assessment     Request for surgical clearance:     Endoscopy Procedure  What type of surgery is being performed?     Endo/colon   When is this surgery scheduled?     09/14/2022  What type of clearance is required ?   Pharmacy  Are there any medications that need to be held prior to surgery and how long? Would like medical clearance that patient is okay to have procedure in office.   Practice name and name of physician performing surgery?      [facility]  What is your office phone and fax number?      Phone- [phone]  Fax- [phone]  Anesthesia type (None, local, MAC, general) ?       MAC

## 2022-08-26 NOTE — Telephone Encounter (Signed)
Please include exact procedure to be performed and MD's name.

## 2022-08-26 NOTE — Telephone Encounter (Signed)
OK from cardiac point of view, I would worry more about his lungs. Not pacemaker dependent, normal device function.

## 2022-08-26 NOTE — Telephone Encounter (Signed)
Dr. Royann Shivers,  We have received a surgical clearance request for a colonoscopy for Mr. Polmanteer.  He has a PMH of HFpEF, COPD, GERD, symptomatic bradycardia s/p PPM 2005. They were seen recently in clinic on 08/18/2022. Can you please comment on surgical clearance. Please forward you guidance and recommendations to [facility] PREOP.  Thank you, Robin Searing, NP

## 2022-08-29 ENCOUNTER — Telehealth: Payer: Self-pay

## 2022-08-29 ENCOUNTER — Ambulatory Visit (INDEPENDENT_AMBULATORY_CARE_PROVIDER_SITE_OTHER): Payer: Medicare HMO | Admitting: Otolaryngology

## 2022-08-29 ENCOUNTER — Encounter (INDEPENDENT_AMBULATORY_CARE_PROVIDER_SITE_OTHER): Payer: Self-pay | Admitting: Otolaryngology

## 2022-08-29 VITALS — BP 106/74 | HR 83 | Ht 69.0 in | Wt 126.0 lb

## 2022-08-29 DIAGNOSIS — J3489 Other specified disorders of nose and nasal sinuses: Secondary | ICD-10-CM

## 2022-08-29 DIAGNOSIS — H6993 Unspecified Eustachian tube disorder, bilateral: Secondary | ICD-10-CM

## 2022-08-29 DIAGNOSIS — F17209 Nicotine dependence, unspecified, with unspecified nicotine-induced disorders: Secondary | ICD-10-CM

## 2022-08-29 DIAGNOSIS — R0981 Nasal congestion: Secondary | ICD-10-CM

## 2022-08-29 DIAGNOSIS — J3089 Other allergic rhinitis: Secondary | ICD-10-CM | POA: Diagnosis not present

## 2022-08-29 DIAGNOSIS — J449 Chronic obstructive pulmonary disease, unspecified: Secondary | ICD-10-CM

## 2022-08-29 DIAGNOSIS — H9313 Tinnitus, bilateral: Secondary | ICD-10-CM | POA: Diagnosis not present

## 2022-08-29 DIAGNOSIS — F17210 Nicotine dependence, cigarettes, uncomplicated: Secondary | ICD-10-CM

## 2022-08-29 DIAGNOSIS — H9193 Unspecified hearing loss, bilateral: Secondary | ICD-10-CM | POA: Diagnosis not present

## 2022-08-29 DIAGNOSIS — K219 Gastro-esophageal reflux disease without esophagitis: Secondary | ICD-10-CM

## 2022-08-29 DIAGNOSIS — J329 Chronic sinusitis, unspecified: Secondary | ICD-10-CM | POA: Diagnosis not present

## 2022-08-29 DIAGNOSIS — J343 Hypertrophy of nasal turbinates: Secondary | ICD-10-CM

## 2022-08-29 DIAGNOSIS — J342 Deviated nasal septum: Secondary | ICD-10-CM | POA: Diagnosis not present

## 2022-08-29 MED ORDER — FLUTICASONE PROPIONATE 50 MCG/ACT NA SUSP: 2.0000 | Freq: Every day | NASAL | 6 refills | Status: AC

## 2022-08-29 MED ORDER — DESLORATADINE 5 MG PO TABS: 5.0000 mg | ORAL_TABLET | Freq: Every day | ORAL | 3 refills | Status: DC

## 2022-08-29 NOTE — Telephone Encounter (Signed)
Patient received medical clearance for procedure.
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
## 2022-08-29 NOTE — Patient Instructions (Addendum)
Continue Flonase and allergy pill  Lloyd Huger Med Nasal Saline Rinse  - schedule hearing test and CT sinuses  -     - start nasal saline rinses with NeilMed Bottle available over the counter or online to help with nasal congestion

## 2022-09-01 ENCOUNTER — Encounter: Payer: Self-pay | Admitting: Internal Medicine

## 2022-09-01 ENCOUNTER — Telehealth: Payer: Self-pay

## 2022-09-01 NOTE — Telephone Encounter (Signed)
CPT code 28413 authorized# [id number] good from 09/01/2022-10/31/2022

## 2022-09-06 DIAGNOSIS — R0902 Hypoxemia: Secondary | ICD-10-CM | POA: Diagnosis not present

## 2022-09-06 DIAGNOSIS — G4730 Sleep apnea, unspecified: Secondary | ICD-10-CM | POA: Diagnosis not present

## 2022-09-07 ENCOUNTER — Ambulatory Visit: Payer: Medicare HMO

## 2022-09-07 ENCOUNTER — Other Ambulatory Visit: Payer: Self-pay | Admitting: Nurse Practitioner

## 2022-09-07 DIAGNOSIS — J209 Acute bronchitis, unspecified: Secondary | ICD-10-CM

## 2022-09-10 ENCOUNTER — Telehealth: Payer: Self-pay | Admitting: Internal Medicine

## 2022-09-10 NOTE — Telephone Encounter (Signed)
Sleep study neg for desats> no noct 02 needed

## 2022-09-12 NOTE — Telephone Encounter (Signed)
Called and lvm for patient to call us back regarding results. ?

## 2022-09-14 ENCOUNTER — Ambulatory Visit (AMBULATORY_SURGERY_CENTER): Payer: Medicare HMO | Admitting: Internal Medicine

## 2022-09-14 ENCOUNTER — Encounter: Payer: Self-pay | Admitting: Internal Medicine

## 2022-09-14 VITALS — BP 115/79 | HR 71 | Temp 98.2°F | Resp 20 | Ht 69.0 in | Wt 130.0 lb

## 2022-09-14 DIAGNOSIS — K2990 Gastroduodenitis, unspecified, without bleeding: Secondary | ICD-10-CM | POA: Diagnosis not present

## 2022-09-14 DIAGNOSIS — R634 Abnormal weight loss: Secondary | ICD-10-CM

## 2022-09-14 DIAGNOSIS — R1013 Epigastric pain: Secondary | ICD-10-CM | POA: Diagnosis not present

## 2022-09-14 DIAGNOSIS — D123 Benign neoplasm of transverse colon: Secondary | ICD-10-CM

## 2022-09-14 DIAGNOSIS — K921 Melena: Secondary | ICD-10-CM | POA: Diagnosis not present

## 2022-09-14 DIAGNOSIS — I509 Heart failure, unspecified: Secondary | ICD-10-CM | POA: Diagnosis not present

## 2022-09-14 DIAGNOSIS — J449 Chronic obstructive pulmonary disease, unspecified: Secondary | ICD-10-CM | POA: Diagnosis not present

## 2022-09-14 DIAGNOSIS — F419 Anxiety disorder, unspecified: Secondary | ICD-10-CM | POA: Diagnosis not present

## 2022-09-14 DIAGNOSIS — K5904 Chronic idiopathic constipation: Secondary | ICD-10-CM

## 2022-09-14 DIAGNOSIS — R1310 Dysphagia, unspecified: Secondary | ICD-10-CM

## 2022-09-14 DIAGNOSIS — K319 Disease of stomach and duodenum, unspecified: Secondary | ICD-10-CM | POA: Diagnosis not present

## 2022-09-14 MED ORDER — HYDROCORTISONE (PERIANAL) 2.5 % EX CREA
1.0000 | TOPICAL_CREAM | Freq: Two times a day (BID) | CUTANEOUS | 0 refills | Status: AC
Start: 2022-09-14 — End: 2022-09-21

## 2022-09-14 MED ORDER — SODIUM CHLORIDE 0.9 % IV SOLN: 500.0000 mL | Freq: Once | INTRAVENOUS | Status: DC

## 2022-09-14 NOTE — Patient Instructions (Signed)
Handouts provided about gastritis, hiatal hernia, hemorrhoids, and polyps.  Anusol HC cream twice a day for 7 days.  Return to GI clinic in 2-3 months.  Await pathology results.  YOU HAD AN ENDOSCOPIC PROCEDURE TODAY AT THE [facility]:   Refer to the procedure report that was given to you for any specific questions about what was found during the examination.  If the procedure report does not answer your questions, please call your gastroenterologist to clarify.  If you requested that your care partner not be given the details of your procedure findings, then the procedure report has been included in a sealed envelope for you to review at your convenience later.  YOU SHOULD EXPECT: Some feelings of bloating in the abdomen. Passage of more gas than usual.  Walking can help get rid of the air that was put into your GI tract during the procedure and reduce the bloating. If you had a lower endoscopy (such as a colonoscopy or flexible sigmoidoscopy) you may notice spotting of blood in your stool or on the toilet paper. If you underwent a bowel prep for your procedure, you may not have a normal bowel movement for a few days.  Please Note:  You might notice some irritation and congestion in your nose or some drainage.  This is from the oxygen used during your procedure.  There is no need for concern and it should clear up in a day or so.  SYMPTOMS TO REPORT IMMEDIATELY:  Following lower endoscopy (colonoscopy or flexible sigmoidoscopy):  Excessive amounts of blood in the stool  Significant tenderness or worsening of abdominal pains  Swelling of the abdomen that is new, acute  Fever of 100F or higher  Following upper endoscopy (EGD)  Vomiting of blood or coffee ground material  New chest pain or pain under the shoulder blades  Painful or persistently difficult swallowing  New shortness of breath  Fever of 100F or higher  Black, tarry-looking stools  For urgent or emergent issues, a  gastroenterologist can be reached at any hour by calling [phone]. Do not use MyChart messaging for urgent concerns.    DIET:  We do recommend a small meal at first, but then you may proceed to your regular diet.  Drink plenty of fluids but you should avoid alcoholic beverages for 24 hours.  ACTIVITY:  You should plan to take it easy for the rest of today and you should NOT DRIVE or use heavy machinery until tomorrow (because of the sedation medicines used during the test).    FOLLOW UP: Our staff will call the number listed on your records the next business day following your procedure.  We will call around 7:15- 8:00 am to check on you and address any questions or concerns that you may have regarding the information given to you following your procedure. If we do not reach you, we will leave a message.     If any biopsies were taken you will be contacted by phone or by letter within the next 1-3 weeks.  Please call us at [phone] if you have not heard about the biopsies in 3 weeks.    SIGNATURES/CONFIDENTIALITY: You and/or your care partner have signed paperwork which will be entered into your electronic medical record.  These signatures attest to the fact that that the information above on your After Visit Summary has been reviewed and is understood.  Full responsibility of the confidentiality of this discharge information lies with you and/or your  care-partner.

## 2022-09-14 NOTE — Op Note (Signed)
[facility] Patient Name: Michael [name] Procedure Date: 09/14/2022 10:57 AM MRN: [id number] Endoscopist: Madelyn Brunner [place] , , [id number] Age: 62 Referring MD:  Date of Birth: 1960-08-08 Gender: Male Account #: [id number] Procedure:                Upper GI endoscopy Indications:              Epigastric abdominal pain, Dysphagia, Melena Medicines:                Monitored Anesthesia Care Procedure:                Pre-Anesthesia Assessment:                           - Prior to the procedure, a History and Physical                            was performed, and patient medications and                            allergies were reviewed. The patient's tolerance of                            previous anesthesia was also reviewed. The risks                            and benefits of the procedure and the sedation                            options and risks were discussed with the patient.                            All questions were answered, and informed consent                            was obtained. Prior Anticoagulants: The patient has                            taken no anticoagulant or antiplatelet agents. ASA                            Grade Assessment: III - A patient with severe                            systemic disease. After reviewing the risks and                            benefits, the patient was deemed in satisfactory                            condition to undergo the procedure.                           After obtaining informed consent, the endoscope was  passed under direct vision. Throughout the                            procedure, the patient's blood pressure, pulse, and                            oxygen saturations were monitored continuously. The                            GIF [id number] #[id number] was introduced through the                            mouth, and advanced to the second part of duodenum.                            The  upper GI endoscopy was accomplished without                            difficulty. The patient tolerated the procedure                            well. Scope In: Scope Out: Findings:                 The examined esophagus was normal. A guidewire was                            placed and the scope was withdrawn. Dilation was                            performed with a Savary dilator with mild                            resistance at 19 mm. Biopsies were taken with a                            cold forceps for histology.                           A small hiatal hernia was present.                           Localized mild inflammation characterized by                            congestion (edema), erosions and erythema was found                            in the gastric antrum. Biopsies were taken with a                            cold forceps for histology.                           Localized mild inflammation characterized by  congestion (edema) and erythema was found in the                            duodenal bulb. Complications:            No immediate complications. Estimated Blood Loss:     Estimated blood loss was minimal. Impression:               - Normal esophagus. Dilated. Biopsied.                           - Small hiatal hernia.                           - Gastritis. Biopsied.                           - Duodenitis. Recommendation:           - Await pathology results.                           - Return to GI clinic in 2-3 months.                           - Perform a colonoscopy today. Dr Particia Lather "[name]" [name],  09/14/2022 11:50:01 AM

## 2022-09-14 NOTE — Op Note (Signed)
[facility] Patient Name: Michael [name] Procedure Date: 09/14/2022 10:51 AM MRN: [id number] Endoscopist: Madelyn Brunner [place] , , [phone] Age: 62 Referring MD:  Date of Birth: [birth year] Gender: Male Account #: [id number] Procedure:                Colonoscopy Indications:              Hematochezia, Weight loss Medicines:                Monitored Anesthesia Care Procedure:                Pre-Anesthesia Assessment:                           - Prior to the procedure, a History and Physical                            was performed, and patient medications and                            allergies were reviewed. The patient's tolerance of                            previous anesthesia was also reviewed. The risks                            and benefits of the procedure and the sedation                            options and risks were discussed with the patient.                            All questions were answered, and informed consent                            was obtained. Prior Anticoagulants: The patient has                            taken no anticoagulant or antiplatelet agents. ASA                            Grade Assessment: III - A patient with severe                            systemic disease. After reviewing the risks and                            benefits, the patient was deemed in satisfactory                            condition to undergo the procedure.                           After obtaining informed consent, the colonoscope  was passed under direct vision. Throughout the                            procedure, the patient's blood pressure, pulse, and                            oxygen saturations were monitored continuously. The                            Olympus Scope SN: [id number] was introduced through                            the anus and advanced to the the terminal ileum.                            The colonoscopy was performed  without difficulty.                            The patient tolerated the procedure well. The                            quality of the bowel preparation was excellent. The                            terminal ileum, ileocecal valve, appendiceal                            orifice, and rectum were photographed. Scope In: 11:26:07 AM Scope Out: 11:40:44 AM Scope Withdrawal Time: 0 hours 10 minutes 54 seconds  Total Procedure Duration: 0 hours 14 minutes 37 seconds  Findings:                 The terminal ileum appeared normal.                           A 5 mm polyp was found in the transverse colon. The                            polyp was sessile. The polyp was removed with a                            cold snare. Resection and retrieval were complete.                           Non-bleeding internal hemorrhoids were found during                            retroflexion. Complications:            No immediate complications. Estimated Blood Loss:     Estimated blood loss was minimal. Impression:               - The examined portion of the ileum was normal.                           -  One 5 mm polyp in the transverse colon, removed                            with a cold snare. Resected and retrieved.                           - Non-bleeding internal hemorrhoids. Recommendation:           - Discharge patient to home (with escort).                           - Await pathology results.                           - Anusol HC cream BID for 7 days.                           - The findings and recommendations were discussed                            with the patient. Dr Particia Lather "[name]" [name],  09/14/2022 11:52:13 AM

## 2022-09-14 NOTE — Progress Notes (Signed)
Vss nad trans to pacu 

## 2022-09-14 NOTE — Progress Notes (Signed)
GASTROENTEROLOGY PROCEDURE H&P NOTE   Primary Care Physician: [name], MD    Reason for Procedure:   Epigastric ab pain, dysphagia, melena, weight loss, hematochezia  Plan:    EGD/colonoscopy  Patient is appropriate for endoscopic procedure(s) in the ambulatory ([facility]) setting.  The nature of the procedure, as well as the risks, benefits, and alternatives were carefully and thoroughly reviewed with the patient. Ample time for discussion and questions allowed. The patient understood, was satisfied, and agreed to proceed.     HPI: Michael [name] is a 62 y.o. male who presents for EGD/colonoscopy for evaluation of epigastric ab pain, dysphagia, melena, weight loss, hematochezia .  Patient was most recently seen in the Gastroenterology Clinic on 08/22/22.  No interval change in medical history since that appointment. Please refer to that note for full details regarding GI history and clinical presentation.   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   GALLBLADDER SURGERY  2003   HAND SURGERY  1998   x3   OPEN REDUCTION INTERNAL FIXATION (ORIF) DISTAL RADIAL FRACTURE Right 09/07/2016   Procedure: RIGHT WRIST OPEN  REDUCTION INTERNAL FIXATION (ORIF) REPAIR AS INDICATED;  Surgeon: Bradly Bienenstock, MD;  Location: [facility] OR;  Service: Orthopedics;  Laterality: Right;   PACEMAKER INSERTION  04/23/2003   [employer]tr[employer] Kappa; [facility] - symptomatic bradycardia   PERMANENT PACEMAKER GENERATOR CHANGE N/A 09/06/2012   Procedure: PERMANENT PACEMAKER GENERATOR CHANGE;  Surgeon: Thurmon Fair, MD;  Location: [facility] CATH LAB;  Service: Cardiovascular;  Laterality: N/A;   TONSILLECTOMY     TRANSTHORACIC ECHOCARDIOGRAM  05/14/2012   EF 50-55%, normal systolic function; mild MR (ordered for mitral valve disease 424.0)    Prior to Admission medications   Medication Sig Start Date End Date Taking? Authorizing Provider  albuterol (ACCUNEB) 1.25 MG/3ML nebulizer solution 2.5 mg every 4 hours as needed for cough, shortness of breath and/or wheezing 08/25/22   [name], MD  aspirin EC 81 MG tablet Take 81 mg by mouth daily. Swallow whole.    [provider]  benzonatate (TESSALON) 200 MG capsule Take 1 capsule by mouth three times daily as needed 09/09/22   Cobb, Ruby Cola, NP  desloratadine (CLARINEX) 5 MG tablet Take 1 tablet (5 mg total) by mouth daily. 08/29/22   Ashok Croon, MD  EPINEPHrine 0.3 mg/0.3 mL IJ SOAJ injection Inject 0.3 mg into the muscle as needed for anaphylaxis. 10/26/21   [name], MD  FLUoxetine (PROZAC) 40 MG capsule Take 2  capsules (80 mg total) by mouth daily. 06/28/22   [name], MD  fluticasone [facility]) 50 MCG/ACT nasal spray Place 2 sprays into both nostrils daily. 08/29/22   Ashok Croon, MD  Na Sulfate-K Sulfate-Mg Sulf 17.5-3.13-1.6 GM/177ML SOLN Take by mouth as directed. 08/22/22   [provider]  omeprazole (PRILOSEC) 40 MG capsule Take 1 capsule (40 mg total) by mouth 2 (two) times daily before a meal. 08/22/22   Doree Albee, PA-C  ondansetron (ZOFRAN) 4 MG tablet Take 1 tablet (4 mg total) by mouth every 8 (eight) hours as needed for  nausea or vomiting. 03/14/22   [name], MD  polyethylene glycol (MIRALAX / GLYCOLAX) 17 g packet Take 17 g by mouth 2 (two) times daily. 08/22/22   Doree Albee, PA-C  predniSONE (DELTASONE) 10 MG tablet 2 until better then 1 daily 05/26/22   [name], MD  tamsulosin (FLOMAX) 0.4 MG CAPS capsule Take 1 capsule (0.4 mg total) by mouth daily. 06/30/22   [name], MD  Tiotropium Bromide-Olodaterol (STIOLTO RESPIMAT) 2.5-2.5 MCG/ACT AERS Inhale 2 puffs into the lungs daily. 05/26/22   [name], MD  VENTOLIN HFA 108 (90 Base) MCG/ACT inhaler INHALE UP TO  2 PUFFS BY MOUTH EVERY 4 HOURS AS NEEDED 08/25/22   [name], MD  Vitamin D, Ergocalciferol, (DRISDOL) 1.25 MG (50000 UNIT) CAPS capsule Take 1 capsule (50,000 Units total) by mouth every Sunday. 10/03/21   Shon Hale, MD  Wheat Dextrin (BENEFIBER DRINK MIX) PACK Once daily use benefier with miralax 08/22/22   Doree Albee, PA-C    Current Outpatient Medications  Medication Sig Dispense Refill   albuterol (ACCUNEB) 1.25 MG/3ML nebulizer solution 2.5 mg every 4 hours as needed for cough, shortness of breath and/or wheezing 75 mL 11   aspirin EC 81 MG tablet Take 81 mg by mouth daily. Swallow whole.     benzonatate (TESSALON) 200 MG capsule Take 1 capsule by mouth three times daily as needed 30 capsule 0   desloratadine (CLARINEX) 5 MG tablet Take 1 tablet (5 mg total) by mouth daily. 90 tablet 3   EPINEPHrine 0.3 mg/0.3 mL IJ SOAJ injection Inject 0.3 mg into the muscle as needed for anaphylaxis. 1 each 1   FLUoxetine (PROZAC) 40 MG capsule Take 2 capsules (80 mg total) by mouth daily. 180 capsule 3   fluticasone (FLONASE) 50 MCG/ACT nasal spray Place 2 sprays into both nostrils daily. 16 g 6   Na Sulfate-K Sulfate-Mg Sulf 17.5-3.13-1.6 GM/177ML SOLN Take by mouth as directed.     omeprazole (PRILOSEC) 40 MG capsule Take 1 capsule (40 mg total) by mouth 2 (two) times daily before a meal. 60 capsule 1    ondansetron (ZOFRAN) 4 MG tablet Take 1 tablet (4 mg total) by mouth every 8 (eight) hours as needed for nausea or vomiting. 20 tablet 0   polyethylene glycol (MIRALAX / GLYCOLAX) 17 g packet Take 17 g by mouth 2 (two) times daily. 60 packet 3   predniSONE (DELTASONE) 10 MG tablet 2 until better then 1 daily 100 tablet 2   tamsulosin (FLOMAX) 0.4 MG CAPS capsule Take 1 capsule (0.4 mg total) by mouth daily. 90 capsule 3   Tiotropium Bromide-Olodaterol (STIOLTO RESPIMAT) 2.5-2.5 MCG/ACT AERS Inhale 2 puffs into the lungs daily. 1 each 11   VENTOLIN HFA 108 (90 Base) MCG/ACT inhaler INHALE UP TO  2 PUFFS BY MOUTH EVERY 4 HOURS AS NEEDED 54 g 3  Vitamin D, Ergocalciferol, (DRISDOL) 1.25 MG (50000 UNIT) CAPS capsule Take 1 capsule (50,000 Units total) by mouth every Sunday. 12 capsule 3   Wheat Dextrin (BENEFIBER DRINK MIX) PACK Once daily use benefier with miralax 28 each 3   Current Facility-Administered Medications  Medication Dose Route Frequency Provider Last Rate Last Admin   0.9 %  sodium chloride infusion  500 mL Intravenous Once Imogene Burn, MD        Allergies as of 09/14/2022 - Review Complete 09/14/2022  Allergen Reaction Noted   Shellfish allergy Anaphylaxis 10/23/2012    Family History  Problem Relation Age of Onset   Heart Problems Mother        MVP   Heart disease Mother    CAD Father    COPD Father    Heart disease Father    Other Sister        crib dealth   Heart attack Maternal Grandmother    Hypertension Maternal Grandmother    Heart attack Paternal Grandmother    Hypertension Paternal Grandmother    Heart attack Paternal Grandfather    Hypertension Paternal Grandfather    Heart Problems Other        aunts x 2 died of heart problems     Social History   Socioeconomic History   Marital status: Divorced    Spouse name: Not on file   Number of children: 2   Years of education: 12   Highest education level: Not on file  Occupational History    Occupation: Disabled  Tobacco Use   Smoking status: Every Day    Current packs/day: 1.00    Average packs/day: 1 pack/day for 44.0 years (44.0 ttl pk-yrs)    Types: Cigarettes   Smokeless tobacco: Never   Tobacco comments:    1ppd, verified by [facility] 05/26/2022  Vaping Use   Vaping status: Never Used  Substance and Sexual Activity   Alcohol use: No   Drug use: Never   Sexual activity: Not Currently  Other Topics Concern   Not on file  Social History Narrative   Not on file   Social Determinants of Health   Financial Resource Strain: Low Risk  (10/21/2021)   Overall Financial Resource Strain (CARDIA)    Difficulty of Paying Living Expenses: Not hard at all  Food Insecurity: No Food Insecurity (10/21/2021)   Hunger Vital Sign    Worried About Running Out of Food in the Last Year: Never true    Ran Out of Food in the Last Year: Never true  Transportation Needs: No Transportation Needs (10/21/2021)   PRAPARE - Administrator, Civil Service (Medical): No    Lack of Transportation (Non-Medical): No  Physical Activity: Inactive (10/21/2021)   Exercise Vital Sign    Days of Exercise per Week: 0 days    Minutes of Exercise per Session: 0 min  Stress: No Stress Concern Present (10/21/2021)   [employer] of Occupational Health - Occupational Stress Questionnaire    Feeling of Stress : Only a little  Social Connections: Moderately Isolated (10/21/2021)   Social Connection and Isolation Panel [NHANES]    Frequency of Communication with Friends and Family: More than three times a week    Frequency of Social Gatherings with Friends and Family: Twice a week    Attends Religious Services: Never    Database administrator or Organizations: Yes    Attends Banker Meetings: Never    Marital Status: Divorced  Intimate Partner Violence: Not At Risk (10/21/2021)   Humiliation, Afraid, Rape, and Kick questionnaire    Fear of Current or Ex-Partner: No    Emotionally  Abused: No    Physically Abused: No    Sexually Abused: No    Physical Exam: Vital signs in last 24 hours: BP 119/78   Pulse 93   Temp 98.2 F (36.8 C) (Temporal)   Ht 5\' 9"  (1.753 m)   Wt 130 lb (59 kg)   SpO2 95%   BMI 19.20 kg/m  GEN: NAD EYE: Sclerae anicteric ENT: MMM CV: Non-tachycardic Pulm: No increased WOB GI: Soft NEURO:  Alert & Oriented   Eulah Pont, MD [facility]   09/14/2022 10:52 AM

## 2022-09-14 NOTE — Progress Notes (Signed)
Called to room to assist during endoscopic procedure.  Patient ID and intended procedure confirmed with present staff. Received instructions for my participation in the procedure from the performing physician.  

## 2022-09-14 NOTE — Progress Notes (Signed)
Pt's states no medical or surgical changes since previsit or office visit. 

## 2022-09-15 ENCOUNTER — Telehealth: Payer: Self-pay

## 2022-09-15 NOTE — Telephone Encounter (Signed)
Follow up call to pt, lm for pt to call if having any difficulty with normal activities or eating and drinking.  Also to call if any other questions or concerns.  

## 2022-09-16 LAB — SURGICAL PATHOLOGY

## 2022-09-21 ENCOUNTER — Encounter: Payer: Self-pay | Admitting: Internal Medicine

## 2022-09-21 ENCOUNTER — Ambulatory Visit (HOSPITAL_COMMUNITY)
Admission: RE | Admit: 2022-09-21 | Discharge: 2022-09-21 | Disposition: A | Discharge status: Home / Self Care | Payer: Medicare HMO | Source: Ambulatory Visit | Attending: Otolaryngology | Admitting: Otolaryngology

## 2022-09-21 DIAGNOSIS — J342 Deviated nasal septum: Secondary | ICD-10-CM | POA: Diagnosis not present

## 2022-09-21 DIAGNOSIS — J329 Chronic sinusitis, unspecified: Secondary | ICD-10-CM | POA: Diagnosis not present

## 2022-10-04 ENCOUNTER — Ambulatory Visit: Payer: Medicare HMO | Admitting: Family Medicine

## 2022-10-04 ENCOUNTER — Telehealth: Payer: Self-pay | Admitting: Physician Assistant

## 2022-10-04 ENCOUNTER — Encounter: Payer: Self-pay | Admitting: Family Medicine

## 2022-10-04 VITALS — BP 108/69 | HR 65 | Temp 98.0°F | Ht 69.0 in | Wt 124.6 lb

## 2022-10-04 DIAGNOSIS — F32A Depression, unspecified: Secondary | ICD-10-CM

## 2022-10-04 DIAGNOSIS — E785 Hyperlipidemia, unspecified: Secondary | ICD-10-CM

## 2022-10-04 DIAGNOSIS — G8929 Other chronic pain: Secondary | ICD-10-CM | POA: Diagnosis not present

## 2022-10-04 DIAGNOSIS — F419 Anxiety disorder, unspecified: Secondary | ICD-10-CM | POA: Diagnosis not present

## 2022-10-04 DIAGNOSIS — I480 Paroxysmal atrial fibrillation: Secondary | ICD-10-CM

## 2022-10-04 DIAGNOSIS — J441 Chronic obstructive pulmonary disease with (acute) exacerbation: Secondary | ICD-10-CM

## 2022-10-04 DIAGNOSIS — R109 Unspecified abdominal pain: Secondary | ICD-10-CM

## 2022-10-04 DIAGNOSIS — E039 Hypothyroidism, unspecified: Secondary | ICD-10-CM | POA: Diagnosis not present

## 2022-10-04 DIAGNOSIS — Z23 Encounter for immunization: Secondary | ICD-10-CM | POA: Diagnosis not present

## 2022-10-04 DIAGNOSIS — R739 Hyperglycemia, unspecified: Secondary | ICD-10-CM

## 2022-10-04 DIAGNOSIS — K219 Gastro-esophageal reflux disease without esophagitis: Secondary | ICD-10-CM | POA: Diagnosis not present

## 2022-10-04 DIAGNOSIS — Z125 Encounter for screening for malignant neoplasm of prostate: Secondary | ICD-10-CM | POA: Diagnosis not present

## 2022-10-04 DIAGNOSIS — Z0000 Encounter for general adult medical examination without abnormal findings: Secondary | ICD-10-CM | POA: Diagnosis not present

## 2022-10-04 LAB — TSH: TSH: 0.13 u[IU]/mL — ABNORMAL LOW (ref 0.35–5.50)

## 2022-10-04 LAB — COMPREHENSIVE METABOLIC PANEL
ALT: 10 U/L (ref 0–53)
AST: 14 U/L (ref 0–37)
Albumin: 3.9 g/dL (ref 3.5–5.2)
Alkaline Phosphatase: 96 U/L (ref 39–117)
BUN: 16 mg/dL (ref 6–23)
CO2: 33 meq/L — ABNORMAL HIGH (ref 19–32)
Calcium: 9.1 mg/dL (ref 8.4–10.5)
Chloride: 102 meq/L (ref 96–112)
Creatinine, Ser: 0.98 mg/dL (ref 0.40–1.50)
GFR: 83.09 mL/min (ref >60.00)
Glucose, Bld: 68 mg/dL — ABNORMAL LOW (ref 70–99)
Potassium: 4 meq/L (ref 3.5–5.1)
Sodium: 140 meq/L (ref 135–145)
Total Bilirubin: 0.4 mg/dL (ref 0.2–1.2)
Total Protein: 6.2 g/dL (ref 6.0–8.3)

## 2022-10-04 LAB — LIPID PANEL
Cholesterol: 127 mg/dL (ref 0–200)
HDL: 43.7 mg/dL (ref >39.00)
LDL Cholesterol: 67 mg/dL (ref 0–99)
NonHDL: 83.2
Total CHOL/HDL Ratio: 3
Triglycerides: 82 mg/dL (ref 0.0–149.0)
VLDL: 16.4 mg/dL (ref 0.0–40.0)

## 2022-10-04 LAB — CBC
HCT: 42.2 % (ref 39.0–52.0)
Hemoglobin: 14.1 g/dL (ref 13.0–17.0)
MCHC: 33.5 g/dL (ref 30.0–36.0)
MCV: 100.4 fL — ABNORMAL HIGH (ref 78.0–100.0)
Platelets: 262 10*3/uL (ref 150.0–400.0)
RBC: 4.2 Mil/uL — ABNORMAL LOW (ref 4.22–5.81)
RDW: 12 % (ref 11.5–15.5)
WBC: 5.5 10*3/uL (ref 4.0–10.5)

## 2022-10-04 LAB — PSA: PSA: 1.77 ng/mL (ref 0.10–4.00)

## 2022-10-04 LAB — HEMOGLOBIN A1C: Hgb A1c MFr Bld: 5.7 % (ref 4.6–6.5)

## 2022-10-04 NOTE — Assessment & Plan Note (Signed)
Stable on Prozac 80 mg daily. 

## 2022-10-04 NOTE — Telephone Encounter (Signed)
PT has concerns as to why the PT cannot eat. Would like to discuss symptoms with nurse.

## 2022-10-04 NOTE — Patient Instructions (Addendum)
It was very nice to see you today!  We gave you your flu shot today.  We will check blood work.  Please continue to work on diet and exercise.  Please call to schedule appointment with gastroenterology soon.  Will see you back in a year for your next physical.  Come back sooner if needed.  Return in about 1 year (around 10/04/2023) for Annual Physical.   Take care, Dr Jimmey Ralph  PLEASE NOTE:  If you had any lab tests, please let us know if you have not heard back within a few days. You may see your results on mychart before we have a chance to review them but we will give you a call once they are reviewed by [place].   If we ordered any referrals today, please let us know if you have not heard from their office within the next week.   If you had any urgent prescriptions sent in today, please check with the pharmacy within an hour of our visit to make sure the prescription was transmitted appropriately.   Please try these tips to maintain a healthy lifestyle:  Eat at least 3 REAL meals and 1-2 snacks per day.  Aim for no more than 5 hours between eating.  If you eat breakfast, please do so within one hour of getting up.   Each meal should contain half fruits/vegetables, one quarter protein, and one quarter carbs (no bigger than a computer mouse)  Cut down on sweet beverages. This includes juice, soda, and sweet tea.   Drink at least 1 glass of water with each meal and aim for at least 8 glasses per day  Exercise at least 150 minutes every week.    Preventive Care 36-11 Years Old, Male Preventive care refers to lifestyle choices and visits with your health care provider that can promote health and wellness. Preventive care visits are also called wellness exams. What can I expect for my preventive care visit? Counseling During your preventive care visit, your health care provider may ask about your: Medical history, including: Past medical problems. Family medical history. Current health,  including: Emotional well-being. Home life and relationship well-being. Sexual activity. Lifestyle, including: Alcohol, nicotine or tobacco, and drug use. Access to firearms. Diet, exercise, and sleep habits. Safety issues such as seatbelt and bike helmet use. Sunscreen use. Work and work Astronomer. Physical exam Your health care provider will check your: Height and weight. These may be used to calculate your BMI (body mass index). BMI is a measurement that tells if you are at a healthy weight. Waist circumference. This measures the distance around your waistline. This measurement also tells if you are at a healthy weight and may help predict your risk of certain diseases, such as type 2 diabetes and high blood pressure. Heart rate and blood pressure. Body temperature. Skin for abnormal spots. What immunizations do I need?  Vaccines are usually given at various ages, according to a schedule. Your health care provider will recommend vaccines for you based on your age, medical history, and lifestyle or other factors, such as travel or where you work. What tests do I need? Screening Your health care provider may recommend screening tests for certain conditions. This may include: Lipid and cholesterol levels. Diabetes screening. This is done by checking your blood sugar (glucose) after you have not eaten for a while (fasting). Hepatitis B test. Hepatitis C test. HIV (human immunodeficiency virus) test. STI (sexually transmitted infection) testing, if you are at risk. Lung cancer  screening. Prostate cancer screening. Colorectal cancer screening. Talk with your health care provider about your test results, treatment options, and if necessary, the need for more tests. Follow these instructions at home: Eating and drinking  Eat a diet that includes fresh fruits and vegetables, whole grains, lean protein, and low-fat dairy products. Take vitamin and mineral supplements as recommended  by your health care provider. Do not drink alcohol if your health care provider tells you not to drink. If you drink alcohol: Limit how much you have to 0-2 drinks a day. Know how much alcohol is in your drink. In the [place] one drink equals one 12 oz bottle of beer (355 mL), one 5 oz glass of wine (148 mL), or one 1 oz glass of hard liquor (44 mL). Lifestyle Brush your teeth every morning and night with fluoride toothpaste. Floss one time each day. Exercise for at least 30 minutes 5 or more days each week. Do not use any products that contain nicotine or tobacco. These products include cigarettes, chewing tobacco, and vaping devices, such as e-cigarettes. If you need help quitting, ask your health care provider. Do not use drugs. If you are sexually active, practice safe sex. Use a condom or other form of protection to prevent STIs. Take aspirin only as told by your health care provider. Make sure that you understand how much to take and what form to take. Work with your health care provider to find out whether it is safe and beneficial for you to take aspirin daily. Find healthy ways to manage stress, such as: Meditation, yoga, or listening to music. Journaling. Talking to a trusted person. Spending time with friends and family. Minimize exposure to UV radiation to reduce your risk of skin cancer. Safety Always wear your seat belt while driving or riding in a vehicle. Do not drive: If you have been drinking alcohol. Do not ride with someone who has been drinking. When you are tired or distracted. While texting. If you have been using any mind-altering substances or drugs. Wear a helmet and other protective equipment during sports activities. If you have firearms in your house, make sure you follow all gun safety procedures. What's next? Go to your health care provider once a year for an annual wellness visit. Ask your health care provider how often you should have your eyes and teeth  checked. Stay up to date on all vaccines. This information is not intended to replace advice given to you by your health care provider. Make sure you discuss any questions you have with your health care provider. Document Revised: 06/17/2020 Document Reviewed: 06/17/2020 Elsevier Patient Education  2024 [employer].

## 2022-10-04 NOTE — Progress Notes (Signed)
Chief Complaint:  Michael [name] is a 62 y.o. male who presents today for his annual comprehensive physical exam.    Assessment/Plan:  Chronic Problems Addressed Today: Hypothyroidism Check TSH.  COPD with acute exacerbation (HCC) Follows with pulmonology for this.  Discussed smoking cessation.  He will continue regimen per pulmonology with albuterol and stioloto.   Chronic abdominal pain Recently has been working with GI for this.  He did have EGD done a few weeks ago which showed reactive gastropathy.  Still has significant pain especially with eating.  Advised him to call to schedule appoint with GI for follow-up soon.  He is on Nexium 40 mg daily.  Paroxysmal atrial fibrillation Regular rate and rhythm today.  Follows with cardiology.  Depression Stable on Prozac 80 mg daily.  Anxiety Stable on Prozac 80 mg daily.  GERD (gastroesophageal reflux disease) Recently had EGD with GI.  Still having significant postprandial abdominal pain.  Recommended follow-up with him ASAP.  He is on Nexium 40 mg daily.  Preventative Healthcare: Flu shot given today.  Check labs.  Had colonoscopy earlier this month.   Patient Counseling(The following topics were reviewed and/or handout was given):  -Nutrition: Stressed importance of moderation in sodium/caffeine intake, saturated fat and cholesterol, caloric balance, sufficient intake of fresh fruits, vegetables, and fiber.  -Stressed the importance of regular exercise.   -Substance Abuse: Discussed cessation/primary prevention of tobacco, alcohol, or other drug use; driving or other dangerous activities under the influence; availability of treatment for abuse.   -Injury prevention: Discussed safety belts, safety helmets, smoke detector, smoking near bedding or upholstery.   -Sexuality: Discussed sexually transmitted diseases, partner selection, use of condoms, avoidance of unintended pregnancy and contraceptive alternatives.   -Dental health:  Discussed importance of regular tooth brushing, flossing, and dental visits.  -Health maintenance and immunizations reviewed. Please refer to Health maintenance section.  Return to care in 1 year for next preventative visit.     Subjective:  HPI:  He has no acute complaints today. See Assessment / plan for status of chronic conditions.   Lifestyle Diet: Limited. Does not eat much due to his chronic abdominal pain.  Exercise: None specific.      10/04/2022   12:54 PM  Depression screen PHQ 2/9  Decreased Interest 0  Down, Depressed, Hopeless 0  PHQ - 2 Score 0    Health Maintenance Due  Topic Date Due   Medicare Annual Wellness (AWV)  10/22/2022     ROS: Per HPI, otherwise a complete review of systems was negative.   PMH:  The following were reviewed and entered/updated in epic: Past Medical History:  Diagnosis Date   Allergy    Anginal pain (HCC)    Anxiety    Arthritis    Bradycardia    CHF (congestive heart failure) (HCC)    Chronic chest pain    COPD (chronic obstructive pulmonary disease) (HCC)    Depression    Emphysema of lung (HCC)    GERD (gastroesophageal reflux disease)    History of nuclear stress test 06/14/2011   lexiscan; mild perfusion defect in basal inferosetpal & apical inferior region; negative for ischemia    Hypothyroidism    Pacemaker    Pneumonia    Presence of permanent cardiac pacemaker April 2005   For bradycardia   Seizure disorder (HCC)    Seizures (HCC)    as a baby   Shortness of breath    Tobacco abuse    Patient Active  Problem List   Diagnosis Date Noted   Pneumonia due to Mycoplasma pneumoniae 08/25/2022   Chronic abdominal pain 05/27/2022   COPD with acute exacerbation (HCC) 04/01/2022   Hemoptysis 04/01/2022   Senile purpura (HCC) 10/12/2021   Osteoarthritis 04/13/2020   Chronic pain syndrome 04/13/2020   Vitamin D deficiency 11/11/2019   History of cardiac pacemaker in situ 11/11/2019   Asthma 11/11/2019    Seizure disorder (HCC) 03/17/2017   Hypothyroidism 03/17/2017   GERD (gastroesophageal reflux disease) 03/17/2017   Anxiety 03/17/2017   Depression 03/17/2017   HTN (hypertension) 03/17/2017   Cigarette smoker 04/13/2016   PTSD (post-traumatic stress disorder) 01/27/2016   Chronic hypercapnic respiratory failure (HCC) 12/09/2015   Paroxysmal atrial fibrillation (HCC) 10/22/2013   Sinus node dysfunction (HCC) 10/22/2013   COPD GOLD III  07/08/2013   Weight loss 09/14/2012   Symptomatic bradycardia 09/06/2012   Pacemaker - Medtronic 2005, generator change September 2014 09/06/2012   Past Surgical History:  Procedure Laterality Date   CARDIAC CATHETERIZATION  2002, 2003   normal coronaries   COLONOSCOPY     EYE SURGERY  [age] y.o.   GALLBLADDER SURGERY  2003   HAND SURGERY  1998   x3   OPEN REDUCTION INTERNAL FIXATION (ORIF) DISTAL RADIAL FRACTURE Right 09/07/2016   Procedure: RIGHT WRIST OPEN REDUCTION INTERNAL FIXATION (ORIF) REPAIR AS INDICATED;  Surgeon: Bradly Bienenstock, MD;  Location: [facility] OR;  Service: Orthopedics;  Laterality: Right;   PACEMAKER INSERTION  04/23/2003   Medtronic Kappa; [facility] - symptomatic bradycardia   PERMANENT PACEMAKER GENERATOR CHANGE N/A 09/06/2012   Procedure: PERMANENT PACEMAKER GENERATOR CHANGE;  Surgeon: Thurmon Fair, MD;  Location: [facility] CATH LAB;  Service: Cardiovascular;  Laterality: N/A;   TONSILLECTOMY     TRANSTHORACIC ECHOCARDIOGRAM  05/14/2012   EF 50-55%, normal systolic function; mild MR (ordered for mitral valve disease 424.0)    Family History  Problem Relation Age of Onset   Heart Problems Mother        MVP   Heart disease Mother    CAD Father    COPD Father    Heart disease Father    Other Sister        crib dealth   Heart attack Maternal Grandmother    Hypertension Maternal Grandmother    Heart attack Paternal Grandmother    Hypertension Paternal Grandmother    Heart attack Paternal Grandfather     Hypertension Paternal Grandfather    Heart Problems Other        aunts x 2 died of heart problems     Medications- reviewed and updated Current Outpatient Medications  Medication Sig Dispense Refill   albuterol (ACCUNEB) 1.25 MG/3ML nebulizer solution 2.5 mg every 4 hours as needed for cough, shortness of breath and/or wheezing 75 mL 11   aspirin EC 81 MG tablet Take 81 mg by mouth daily. Swallow whole.     benzonatate (TESSALON) 200 MG capsule Take 1 capsule by mouth three times daily as needed 30 capsule 0   desloratadine (CLARINEX) 5 MG tablet Take 1 tablet (5 mg total) by mouth daily. 90 tablet 3   EPINEPHrine 0.3 mg/0.3 mL IJ SOAJ injection Inject 0.3 mg into the muscle as needed for anaphylaxis. 1 each 1   esomeprazole (NEXIUM) 40 MG capsule Take 40 mg by mouth daily at 12 noon.     FLUoxetine (PROZAC) 40 MG capsule Take 2 capsules (80 mg total) by mouth daily. 180 capsule 3   fluticasone (FLONASE)  50 MCG/ACT nasal spray Place 2 sprays into both nostrils daily. 16 g 6   omeprazole (PRILOSEC) 40 MG capsule Take 1 capsule (40 mg total) by mouth 2 (two) times daily before a meal. 60 capsule 1   ondansetron (ZOFRAN) 4 MG tablet Take 1 tablet (4 mg total) by mouth every 8 (eight) hours as needed for nausea or vomiting. 20 tablet 0   polyethylene glycol (MIRALAX / GLYCOLAX) 17 g packet Take 17 g by mouth 2 (two) times daily. 60 packet 3   predniSONE (DELTASONE) 10 MG tablet 2 until better then 1 daily 100 tablet 2   tamsulosin (FLOMAX) 0.4 MG CAPS capsule Take 1 capsule (0.4 mg total) by mouth daily. 90 capsule 3   Tiotropium Bromide-Olodaterol (STIOLTO RESPIMAT) 2.5-2.5 MCG/ACT AERS Inhale 2 puffs into the lungs daily. 1 each 11   VENTOLIN HFA 108 (90 Base) MCG/ACT inhaler INHALE UP TO  2 PUFFS BY MOUTH EVERY 4 HOURS AS NEEDED 54 g 3   Vitamin D, Ergocalciferol, (DRISDOL) 1.25 MG (50000 UNIT) CAPS capsule Take 1 capsule (50,000 Units total) by mouth every Sunday. 12 capsule 3   Wheat Dextrin  (BENEFIBER DRINK MIX) PACK Once daily use benefier with miralax 28 each 3   No current facility-administered medications for this visit.    Allergies-reviewed and updated Allergies  Allergen Reactions   Shellfish Allergy Anaphylaxis    Social History   Socioeconomic History   Marital status: Divorced    Spouse name: Not on file   Number of children: 2   Years of education: 12   Highest education level: Not on file  Occupational History   Occupation: Disabled  Tobacco Use   Smoking status: Every Day    Current packs/day: 1.00    Average packs/day: 1 pack/day for 44.0 years (44.0 ttl pk-yrs)    Types: Cigarettes   Smokeless tobacco: Never   Tobacco comments:    1ppd, verified by [facility] 05/26/2022  Vaping Use   Vaping status: Never Used  Substance and Sexual Activity   Alcohol use: No   Drug use: Never   Sexual activity: Not Currently  Other Topics Concern   Not on file  Social History Narrative   Not on file   Social Determinants of Health   Financial Resource Strain: Low Risk  (10/21/2021)   Overall Financial Resource Strain (CARDIA)    Difficulty of Paying Living Expenses: Not hard at all  Food Insecurity: No Food Insecurity (10/21/2021)   Hunger Vital Sign    Worried About Running Out of Food in the Last Year: Never true    Ran Out of Food in the Last Year: Never true  Transportation Needs: No Transportation Needs (10/21/2021)   PRAPARE - Administrator, Civil Service (Medical): No    Lack of Transportation (Non-Medical): No  Physical Activity: Inactive (10/21/2021)   Exercise Vital Sign    Days of Exercise per Week: 0 days    Minutes of Exercise per Session: 0 min  Stress: No Stress Concern Present (10/21/2021)   [employer] of Occupational Health - Occupational Stress Questionnaire    Feeling of Stress : Only a little  Social Connections: Moderately Isolated (10/21/2021)   Social Connection and Isolation Panel [NHANES]    Frequency of  Communication with Friends and Family: More than three times a week    Frequency of Social Gatherings with Friends and Family: Twice a week    Attends Religious Services: Never    Active Member  of Clubs or Organizations: Yes    Attends Banker Meetings: Never    Marital Status: Divorced        Objective:  Physical Exam: BP 108/69   Pulse 65   Temp 98 F (36.7 C) (Temporal)   Ht 5\' 9"  (1.753 m)   Wt 124 lb 9.6 oz (56.5 kg)   SpO2 97%   BMI 18.40 kg/m   Body mass index is 18.4 kg/m. Wt Readings from Last 3 Encounters:  10/04/22 124 lb 9.6 oz (56.5 kg)  09/14/22 130 lb (59 kg)  08/29/22 126 lb (57.2 kg)   Gen: NAD, resting comfortably HEENT: TMs normal bilaterally. OP clear. No thyromegaly noted.  CV: RRR with no murmurs appreciated Pulm: NWOB, CTAB with no crackles, wheezes, or rhonchi GI: Normal bowel sounds present. Soft, Nontender, Nondistended. MSK: no edema, cyanosis, or clubbing noted Skin: warm, dry Neuro: CN2-12 grossly intact. Strength 5/5 in upper and lower extremities. Reflexes symmetric and intact bilaterally.  Psych: Normal affect and thought content     Emaleigh Guimond M. Jimmey Ralph, MD 10/04/2022 1:24 PM

## 2022-10-04 NOTE — Assessment & Plan Note (Signed)
Check TSH 

## 2022-10-04 NOTE — Assessment & Plan Note (Signed)
Recently had EGD with GI.  Still having significant postprandial abdominal pain.  Recommended follow-up with him ASAP.  He is on Nexium 40 mg daily.

## 2022-10-04 NOTE — Assessment & Plan Note (Signed)
Recently has been working with GI for this.  He did have EGD done a few weeks ago which showed reactive gastropathy.  Still has significant pain especially with eating.  Advised him to call to schedule appoint with GI for follow-up soon.  He is on Nexium 40 mg daily.

## 2022-10-04 NOTE — Assessment & Plan Note (Signed)
Regular rate and rhythm today.  Follows with cardiology.

## 2022-10-04 NOTE — Assessment & Plan Note (Signed)
Follows with pulmonology for this.  Discussed smoking cessation.  He will continue regimen per pulmonology with albuterol and stioloto.

## 2022-10-05 NOTE — Telephone Encounter (Signed)
Pts mother called and states that he is still not able to eat much at all and is still losing wt. He was seen by his PCP and was told he needed to follow-up with GI. She was upset because she called and was given an appt for Jan. Scheduled pt to see Doug Sou PA 10/11/22 at 11:30am. Pts mother aware of appt.

## 2022-10-06 ENCOUNTER — Ambulatory Visit: Payer: Medicare HMO | Attending: Otolaryngology | Admitting: Audiologist

## 2022-10-06 DIAGNOSIS — Z570 Occupational exposure to noise: Secondary | ICD-10-CM | POA: Diagnosis not present

## 2022-10-06 DIAGNOSIS — H903 Sensorineural hearing loss, bilateral: Secondary | ICD-10-CM

## 2022-10-06 DIAGNOSIS — H9313 Tinnitus, bilateral: Secondary | ICD-10-CM

## 2022-10-06 NOTE — Procedures (Signed)
  Outpatient Audiology and [facility] [address] [place], [place]  [address] [phone]  AUDIOLOGICAL  EVALUATION  NAME: Michael [name]     DOB:   1960/07/20      MRN: [id number]                                                                                     DATE: 10/06/2022     REFERENT: [name], MD STATUS: Outpatient DIAGNOSIS: Noise-induced hearing loss bilateral, tinnitus bilateral  History: [name] was seen for an audiological evaluation due to ringing in his ears and sinus pressure. 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  Sound routinely was over 85 dB.  He started having ringing in both ears.  He now cannot hear people clearly, needs things repeated often, and is turning up the music loud in his car in order to hear it.  He has had a lot of sinus dysfunction this year, and when his sinuses are acting up he feels his hearing gets worse.  He has a ringing in both ears that sounds both like a high pitch sound and cicadas in the summertime.   Evaluation:  Otoscopy showed a clear view of the tympanic membranes, bilaterally Tympanometry results were consistent with normal middle ear function bilaterally Audiometric testing was completed using Conventional Audiometry techniques with insert earphones and TDH headphones. Test results are consistent with normal sloping to sensorineural moderately severe hearing loss with a noise notch bilaterally. Speech Recognition Thresholds were obtained at 25 dB HL in the right ear and at 20 dB HL in the left ear. Word Recognition Testing was completed at 40 dB SL and Steffen scored 100% in each ear  Results:  The test results were reviewed with Channing Mutters and his mother.  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  He needs hearing aids for both years, this would help with both the awareness of his ringing and his difficulty hearing people.  He  agrees to try hearing aids due to the strain that hearing loss is causing.  He says it gives him a headache every day trying to understand people.   Recommendations: 1.   A copy of the audiogram and a list of local hearing aid providers were given to the patient.  He was encouraged to call his insurance company to check for coverage.  28 minutes spent testing and counseling on results.   If you have any questions please feel free to contact me at [phone].  Ammie Ferrier Audiologist, Au.D., CCC-A 10/06/2022  3:28 PM  Cc: [name], MD

## 2022-10-07 ENCOUNTER — Other Ambulatory Visit: Payer: Self-pay | Admitting: *Deleted

## 2022-10-07 DIAGNOSIS — E039 Hypothyroidism, unspecified: Secondary | ICD-10-CM

## 2022-10-07 NOTE — Progress Notes (Signed)
His thyroid level is off. Please see if we can add on a free T4 and free T3.  If not we may need to have him come back for repeat blood draw.  Please place future orders if needed.  The rest of his labs are all stable and we can recheck everything else in a year.

## 2022-10-10 ENCOUNTER — Other Ambulatory Visit: Payer: Self-pay | Admitting: Nurse Practitioner

## 2022-10-10 DIAGNOSIS — J209 Acute bronchitis, unspecified: Secondary | ICD-10-CM

## 2022-10-11 ENCOUNTER — Encounter: Payer: Self-pay | Admitting: Gastroenterology

## 2022-10-11 ENCOUNTER — Ambulatory Visit: Payer: Medicare HMO | Admitting: Gastroenterology

## 2022-10-11 VITALS — BP 120/80 | HR 60 | Ht 69.0 in | Wt 123.0 lb

## 2022-10-11 DIAGNOSIS — G8929 Other chronic pain: Secondary | ICD-10-CM | POA: Diagnosis not present

## 2022-10-11 DIAGNOSIS — R140 Abdominal distension (gaseous): Secondary | ICD-10-CM | POA: Diagnosis not present

## 2022-10-11 DIAGNOSIS — R109 Unspecified abdominal pain: Secondary | ICD-10-CM | POA: Diagnosis not present

## 2022-10-11 MED ORDER — MIRTAZAPINE 15 MG PO TABS: 15.0000 mg | ORAL_TABLET | Freq: Every day | ORAL | 3 refills | Status: DC

## 2022-10-11 NOTE — Progress Notes (Signed)
10/11/2022 Michael [name] [id number] 02-20-1960   HISTORY OF PRESENT ILLNESS:  This is a 62 year old male who is a patient of Dr. Derek Mound.  He has been evaluated for complaints of abdominal pain, bloating, belching, nausea, and weight loss.  It looks like compared to April of this year his weight is down about 3 pounds.  He has had CT scans, EGD, colonoscopy, esophagram, extensive labs that have all been fairly unrevealing.  TSH is low so that he is having some further lab workup for that tomorrow is when he is post to have the labs drawn.  CT scan suggested some constipation but he says he is moving his bowels has not been using MiraLAX or Benefiber.  Continues with the same symptoms.  Had some gastritis and small hiatal hernia, but is on omeprazole 40 mg twice daily.  He is burping throughout the visit today.  His mother is with him today.  He says that he feels like he cannot eat because of the "air on his stomach".   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   GALLBLADDER SURGERY  2003   HAND SURGERY  1998   x3   OPEN REDUCTION INTERNAL FIXATION (ORIF) DISTAL RADIAL FRACTURE Right 09/07/2016   Procedure:  RIGHT WRIST OPEN REDUCTION INTERNAL FIXATION (ORIF) REPAIR AS INDICATED;  Surgeon: Bradly Bienenstock, MD;  Location: [facility] OR;  Service: Orthopedics;  Laterality: Right;   PACEMAKER INSERTION  04/23/2003   Medtronic Kappa; [facility] - symptomatic bradycardia   PERMANENT PACEMAKER GENERATOR CHANGE N/A 09/06/2012   Procedure: PERMANENT PACEMAKER GENERATOR CHANGE;  Surgeon: Thurmon Fair, MD;  Location: [facility] CATH LAB;  Service: Cardiovascular;  Laterality: N/A;   TONSILLECTOMY     TRANSTHORACIC ECHOCARDIOGRAM  05/14/2012   EF 50-55%, normal systolic function; mild MR (ordered for mitral valve disease 424.0)    reports that he has been smoking cigarettes. He has a 44 pack-year smoking history. He has never used smokeless tobacco. He reports that he does not drink alcohol and does not use drugs. family history includes CAD in his father; COPD in his father; Heart Problems in his mother and another family member; Heart attack in his maternal grandmother, paternal grandfather, and paternal grandmother; Heart disease in his father and mother; Hypertension in his maternal grandmother, paternal grandfather, and paternal grandmother; Other in his sister. Allergies  Allergen Reactions   Shellfish Allergy Anaphylaxis      Outpatient Encounter Medications as of 10/11/2022  Medication Sig  albuterol (ACCUNEB) 1.25 MG/3ML nebulizer solution 2.5 mg every 4 hours as needed for cough, shortness of breath and/or wheezing   aspirin EC 81 MG tablet Take 81 mg by mouth daily. Swallow whole.   benzonatate (TESSALON) 200 MG capsule Take 1 capsule by mouth three times daily as needed   desloratadine (CLARINEX) 5 MG tablet Take 1 tablet (5 mg total) by mouth daily.   EPINEPHrine 0.3 mg/0.3 mL IJ SOAJ injection Inject 0.3 mg into the muscle as needed for anaphylaxis.   esomeprazole (NEXIUM) 40 MG capsule Take 40 mg by mouth daily at 12 noon.   FLUoxetine (PROZAC) 40 MG capsule Take 2 capsules (80 mg total)  by mouth daily.   fluticasone (FLONASE) 50 MCG/ACT nasal spray Place 2 sprays into both nostrils daily.   omeprazole (PRILOSEC) 40 MG capsule Take 1 capsule (40 mg total) by mouth 2 (two) times daily before a meal.   ondansetron (ZOFRAN) 4 MG tablet Take 1 tablet (4 mg total) by mouth every 8 (eight) hours as needed for nausea or vomiting.   predniSONE (DELTASONE) 10 MG tablet 2 until better then 1 daily   tamsulosin (FLOMAX) 0.4 MG CAPS capsule Take 1 capsule (0.4 mg total) by mouth daily.   Tiotropium Bromide-Olodaterol (STIOLTO RESPIMAT) 2.5-2.5 MCG/ACT AERS Inhale 2 puffs into the lungs daily.   VENTOLIN HFA 108 (90 Base) MCG/ACT inhaler INHALE UP TO  2 PUFFS BY MOUTH EVERY 4 HOURS AS NEEDED   Vitamin D, Ergocalciferol, (DRISDOL) 1.25 MG (50000 UNIT) CAPS capsule Take 1 capsule (50,000 Units total) by mouth every Sunday.   [DISCONTINUED] polyethylene glycol (MIRALAX / GLYCOLAX) 17 g packet Take 17 g by mouth 2 (two) times daily.   [DISCONTINUED] Wheat Dextrin (BENEFIBER DRINK MIX) PACK Once daily use benefier with miralax   No facility-administered encounter medications on file as of 10/11/2022.     REVIEW OF SYSTEMS  : All other systems reviewed and negative except where noted in the History of Present Illness.   PHYSICAL EXAM: BP 120/80   Pulse 60   Ht 5\' 9"  (1.753 m)   Wt 123 lb (55.8 kg)   BMI 18.16 kg/m  General: Thin white male in no acute distress Head: Normocephalic and atraumatic Eyes:  Sclerae anicteric, conjunctiva pink. Ears: Normal auditory acuity Lungs: Some coarse BS are noted. Heart: Regular rate and rhythm; no M/R/G. Abdomen: Soft, non-distended.  BS present.  Mild diffuse TTP. Musculoskeletal: Symmetrical with no gross deformities  Skin: No lesions on visible extremities Extremities: No edema  Neurological: Alert oriented x 4, grossly non-focal Psychological:  Alert and cooperative. Normal mood and affect  ASSESSMENT AND PLAN: 62 year old male with  complaints of abdominal pain, bloating, belching, nausea, and weight loss.  It looks like compared to April of this year his weight is down about 3 pounds.  He has had CT scans, EGD, colonoscopy, esophagram, extensive labs that have all been fairly unrevealing.  TSH is low so that he is having some further lab workup for that tomorrow is when he is post to have the labs drawn.  CT scan suggested some constipation but he says he is moving his bowels has not been using MiraLAX or Benefiber.  Continues with the same symptoms.  Had some gastritis and small hiatal hernia, but is on omeprazole 40 mg twice daily.  Suspect maybe this is functional.  He is burping throughout the visit today.  Will try mirtazapine 15 mg at bedtime.  He already has a follow-up appointment  made with [name] in January so she can see him back at that point which will be 26-month interval and see if we need to increase that medication, change it, etc.  Prescription sent to pharmacy.   CC:  [name], MD

## 2022-10-11 NOTE — Patient Instructions (Addendum)
We have sent the following medications to your pharmacy for you to pick up at your convenience: Mirtazapine 15 mg nightly at bedtime.   Keep follow up with [name].  _______________________________________________________  If your blood pressure at your visit was 140/90 or greater, please contact your primary care physician to follow up on this.  _______________________________________________________  If you are age 61 or older, your body mass index should be between 23-30. Your Body mass index is 18.16 kg/m. If this is out of the aforementioned range listed, please consider follow up with your Primary Care Provider.  If you are age 81 or younger, your body mass index should be between 19-25. Your Body mass index is 18.16 kg/m. If this is out of the aformentioned range listed, please consider follow up with your Primary Care Provider.   ________________________________________________________  The [facility] providers would like to encourage you to use [facility] to communicate with providers for non-urgent requests or questions.  Due to long hold times on the telephone, sending your provider a message by [facility] may be a faster and more efficient way to get a response.  Please allow 48 business hours for a response.  Please remember that this is for non-urgent requests.  _______________________________________________________

## 2022-10-12 ENCOUNTER — Other Ambulatory Visit (INDEPENDENT_AMBULATORY_CARE_PROVIDER_SITE_OTHER): Payer: Medicare HMO

## 2022-10-12 DIAGNOSIS — E039 Hypothyroidism, unspecified: Secondary | ICD-10-CM | POA: Diagnosis not present

## 2022-10-12 LAB — T4, FREE: Free T4: 0.72 ng/dL (ref 0.60–1.60)

## 2022-10-12 LAB — T3, FREE: T3, Free: 2.8 pg/mL (ref 2.3–4.2)

## 2022-10-12 NOTE — Progress Notes (Signed)
I agree with the assessment and plan as outlined by Ms. Zehr. 

## 2022-10-14 NOTE — Progress Notes (Signed)
His repeat thyroid levels are normal.  We can repeat in 6 to 12 months.  Katina Degree. Jimmey Ralph, MD 10/14/2022 8:31 AM

## 2022-10-17 ENCOUNTER — Encounter (INDEPENDENT_AMBULATORY_CARE_PROVIDER_SITE_OTHER): Payer: Self-pay | Admitting: Otolaryngology

## 2022-10-17 ENCOUNTER — Ambulatory Visit (INDEPENDENT_AMBULATORY_CARE_PROVIDER_SITE_OTHER): Payer: Medicare HMO | Admitting: Otolaryngology

## 2022-10-17 VITALS — BP 119/76 | HR 81

## 2022-10-17 DIAGNOSIS — H903 Sensorineural hearing loss, bilateral: Secondary | ICD-10-CM

## 2022-10-17 DIAGNOSIS — F17209 Nicotine dependence, unspecified, with unspecified nicotine-induced disorders: Secondary | ICD-10-CM

## 2022-10-17 DIAGNOSIS — J343 Hypertrophy of nasal turbinates: Secondary | ICD-10-CM

## 2022-10-17 DIAGNOSIS — R0981 Nasal congestion: Secondary | ICD-10-CM

## 2022-10-17 DIAGNOSIS — F17210 Nicotine dependence, cigarettes, uncomplicated: Secondary | ICD-10-CM

## 2022-10-17 DIAGNOSIS — K219 Gastro-esophageal reflux disease without esophagitis: Secondary | ICD-10-CM | POA: Diagnosis not present

## 2022-10-17 DIAGNOSIS — J3489 Other specified disorders of nose and nasal sinuses: Secondary | ICD-10-CM

## 2022-10-17 DIAGNOSIS — J3089 Other allergic rhinitis: Secondary | ICD-10-CM | POA: Diagnosis not present

## 2022-10-17 DIAGNOSIS — R490 Dysphonia: Secondary | ICD-10-CM

## 2022-10-17 DIAGNOSIS — J449 Chronic obstructive pulmonary disease, unspecified: Secondary | ICD-10-CM

## 2022-10-17 DIAGNOSIS — J342 Deviated nasal septum: Secondary | ICD-10-CM | POA: Diagnosis not present
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
## 2022-10-17 NOTE — Patient Instructions (Addendum)
-   use nasal sprays (Flonase and Ocean Spray) and Clarinex (allergy pill) - consider smoking cessation  - continue reflux medications - see your PCP for weight loss - irrigate with nasal saline  - see Hearing aid specialist   [email or web address] Experience the [employer]. Learn more about our discreet & modern hearing aids. [address], [place], [place]? - ?Open today  9:00?AM?-?4:30?PM  [phone]  Lloyd Huger Med Nasal Saline Rinse   - start nasal saline rinses with [employer]le available over the counter or online to help with nasal congestion

## 2022-10-20 ENCOUNTER — Other Ambulatory Visit (HOSPITAL_BASED_OUTPATIENT_CLINIC_OR_DEPARTMENT_OTHER): Payer: Self-pay

## 2022-10-20 MED ORDER — COVID-19 MRNA VAC-TRIS(PFIZER) 30 MCG/0.3ML IM SUSY
0.3000 mL | PREFILLED_SYRINGE | Freq: Once | INTRAMUSCULAR | 0 refills | Status: AC
  Filled 2022-10-20: qty 0.3, 1d supply, fill #0

## 2022-10-26 ENCOUNTER — Other Ambulatory Visit: Payer: Self-pay | Admitting: Nurse Practitioner

## 2022-10-26 DIAGNOSIS — J209 Acute bronchitis, unspecified: Secondary | ICD-10-CM

## 2022-11-01 ENCOUNTER — Ambulatory Visit: Payer: Medicare HMO

## 2022-11-01 ENCOUNTER — Other Ambulatory Visit: Payer: Self-pay | Admitting: Physician Assistant

## 2022-11-01 DIAGNOSIS — R1319 Other dysphagia: Secondary | ICD-10-CM

## 2022-11-26 ENCOUNTER — Other Ambulatory Visit: Payer: Self-pay | Admitting: Nurse Practitioner

## 2022-11-26 DIAGNOSIS — J209 Acute bronchitis, unspecified: Secondary | ICD-10-CM

## 2022-11-28 ENCOUNTER — Other Ambulatory Visit: Payer: Self-pay

## 2022-11-28 MED ORDER — STIOLTO RESPIMAT 2.5-2.5 MCG/ACT IN AERS: 2.0000 | INHALATION_SPRAY | Freq: Every day | RESPIRATORY_TRACT | 11 refills | Status: DC

## 2022-12-04 NOTE — Progress Notes (Unsigned)
Subjective:     Patient ID: Michael [name], male   DOB: 02/20/1960      MRN: [id number]    Brief patient profile:  17 yowm MM/active smoker referred to pulmonary clinic 12/08/2015 by Dr  Ilsa Iha with GOLD III copd documented 08/19/13     History of Present Illness  10/11/2017  f/u ov/Michael [name] re: COPD III/ still smoking maint on symb/ spiriva dpi  Chief Complaint  Patient presents with   Follow-up    Breathing "depends on the heat outside"- he is unsure of meds and states "needs refills on everything".    Dyspnea:  MMRC3 = can't walk 100 yards even at a slow pace at a flat grade s stopping due to sob   Cough: provoked  with active smoking and ? From dpi spiriva also/ min mucoid  Sleeping: bed flat with a couple of pillows  SABA use: way too much when weather hot  02: none  rec Change spiriva to Respimat form  and use 2 pffs first thing in am only to follow the symbicort x 2 puffs only in the am, then take the other 2 pffs of symbicort 12 hours later  Work on inhaler technique:  The key is to stop smoking completely before smoking completely stops you!  Please schedule a follow up visit in 3 months but call sooner if needed  with all medications      05/26/2022  f/u ov/[place] [name]/Michael [name] re: GOLD 3 copd maint on symbicort /spiriva/ pred 10 mg daily   still smoking Chief Complaint  Patient presents with   Follow-up    Pt f/u states that he is still wheezing in the morning and having a productive cough w/ ranging colors. Currently smoking 1ppd - requesting refills on all inhalers along w/ flonase, omeprazole &   Dyspnea:  walking around property slow pace  Cough: none in the last week  prior to OV   Sleeping: couch propped up on arm and pillow SABA use: one or twice every night / neb once at night  02: none  Rec Pepcid 20 mg after 1st and last meals GERD diet reviewed, bed blocks rec  Plan A = Automatic = Always=    stiolto 2 puffs each am and prednisone 10 mg 2 daily until better  then one daily  Work on inhaler technique: >>>  Remember how golfers warm up by taking practice swings - do this with an empty inhaler  Plan B = Backup (to supplement plan A, not to replace it) Only use your albuterol inhaler as a rescue medication  Plan C = Crisis (instead of Plan B but only if Plan B stops working) - only use your albuterol nebulizer if you first try Plan B and it fails to help > ok to use the nebulizer up to every 4 hours but if start needing it regularly call for immediate appointment    08/25/2022 3 m  f/u ov/Michael [name]/[name] re: GOLD 3 copd maint on Stiolto  / pred 20 mg until better, then 10 mg maint  Chief Complaint  Patient presents with   COPD    Gold 3  Dyspnea:  slow pace walking  Cough: bad noct x month variably colored  Sleeping: x months has breathing attacks nightly of coughing/sob calm down p an hour, has never tried neb saba   SABA use: hfa only  02: none   Rec The key is to stop smoking completely before smoking completely stops you!  ONO RA no desats 09/06/22   12/05/2022  3 m f/u ov/[place] [name]/Michael [name] re: GOLD 3 copd  maint on stiolto and pred 10 mg ddaily / still smoking   Chief Complaint  Patient presents with   Shortness of Breath   COPD   Dyspnea:  MMRC2 = can't walk a nl pace on a flat grade s sob but does fine slow and flat eg  Cough: white esp in am  Sleeping  more rarely needing saba  SABA use: varies hfa/ neb s clear plan  02: none   Lung cancer screening: q July    No obvious day to day or daytime variability or assoc excess/ purulent sputum or mucus plugs or hemoptysis or cp or chest tightness, subjective wheeze or overt sinus or hb symptoms.    Also denies any obvious fluctuation of symptoms with weather or environmental changes or other aggravating or alleviating factors except as outlined above   No unusual exposure hx or h/o childhood pna/ asthma or knowledge of premature birth.  Current Allergies, Complete Past  Medical History, Past Surgical History, Family History, and Social History were reviewed in [employer] record.  ROS  The following are not active complaints unless bolded Hoarseness, sore throat, dysphagia, dental problems, itching, sneezing,  nasal congestion or discharge of excess mucus or purulent secretions, ear ache,   fever, chills, sweats, unintended wt loss or wt gain, classically pleuritic or exertional cp,  orthopnea pnd or arm/hand swelling  or leg swelling, presyncope, palpitations, abdominal pain, anorexia, nausea, vomiting, diarrhea  or change in bowel habits or change in bladder habits, change in stools or change in urine, dysuria, hematuria,  rash, arthralgias, visual complaints, headache, numbness, weakness or ataxia or problems with walking or coordination,  change in mood or  memory.        Current Meds  Medication Sig   aspirin EC 81 MG tablet Take 81 mg by mouth daily. Swallow whole.   desloratadine (CLARINEX) 5 MG tablet Take 1 tablet (5 mg total) by mouth daily.   EPINEPHrine 0.3 mg/0.3 mL IJ SOAJ injection Inject 0.3 mg into the muscle as needed for anaphylaxis.   FLUoxetine (PROZAC) 40 MG capsule Take 2 capsules (80 mg total) by mouth daily.   fluticasone (FLONASE) 50 MCG/ACT nasal spray Place 2 sprays into both nostrils daily.   mirtazapine (REMERON) 15 MG tablet Take 1 tablet (15 mg total) by mouth at bedtime.   omeprazole (PRILOSEC) 40 MG capsule TAKE 1 CAPSULE BY MOUTH TWICE DAILY BEFORE A MEAL   ondansetron (ZOFRAN) 4 MG tablet Take 1 tablet (4 mg total) by mouth every 8 (eight) hours as needed for nausea or vomiting.   predniSONE (DELTASONE) 10 MG tablet 2 until better then 1 daily   tamsulosin (FLOMAX) 0.4 MG CAPS capsule Take 1 capsule (0.4 mg total) by mouth daily.   Vitamin D, Ergocalciferol, (DRISDOL) 1.25 MG (50000 UNIT) CAPS capsule Take 1 capsule (50,000 Units total) by mouth every Sunday.   [DISCONTINUED] albuterol (ACCUNEB) 1.25  MG/3ML nebulizer solution 2.5 mg every 4 hours as needed for cough, shortness of breath and/or wheezing   [DISCONTINUED] omeprazole (PRILOSEC) 40 MG capsule Take 40 mg by mouth 2 (two) times daily with a meal.   [DISCONTINUED] Tiotropium Bromide-Olodaterol (STIOLTO RESPIMAT) 2.5-2.5 MCG/ACT AERS Inhale 2 puffs into the lungs daily.   [DISCONTINUED] VENTOLIN HFA 108 (90 Base) MCG/ACT inhaler INHALE UP TO  2 PUFFS BY MOUTH EVERY 4 HOURS AS NEEDED  Objective:   Physical Exam  Wts  12/05/2022       127  08/25/2022      126   05/26/2022      125  10/13/2021       133  04/15/2021         136  10/06/2020         135  10/04/2019         126  02/19/2019         137  01/12/2018       137  10/11/2017       131  07/11/2017         125  01/11/2017         139  07/11/2016         144   04/11/2016        147          12/08/15 157 lb 3.2 oz (71.3 kg)  11/04/15 153 lb 12.8 oz (69.8 kg)  08/25/15 160 lb 12.8 oz (72.9 kg)     Vital signs reviewed  12/05/2022  - Note at rest 02 sats  95% on RA   General appearance:    disheveled elderly wm nad / rattling cough    HEENT :  Oropharynx  clear      NECK :  without JVD/Nodes/TM/ nl carotid upstrokes bilaterally   LUNGS: no acc muscle use,  Mod barrel  contour chest wall with bilateral  Distant bs s audible wheeze and  without cough on insp or exp maneuvers and mod  Hyperresonant  to  percussion bilaterally     CV:  RRR  no s3 or murmur or increase in P2, and no edema   ABD:  soft and nontender    MS:   Ext warm without deformities or   obvious joint restrictions , calf tenderness, cyanosis or clubbing  SKIN: warm and dry without lesions    NEURO:  alert, approp, nl sensorium with  no motor or cerebellar deficits apparent.                      I personally reviewed images and agree with radiology impression as follows:   Chest LDSCT     07/06/22      Mild diffuse bronchial wall thickening with severe centrilobular and paraseptal  emphysema        Assessment:

## 2022-12-05 ENCOUNTER — Encounter: Payer: Self-pay | Admitting: Internal Medicine

## 2022-12-05 ENCOUNTER — Ambulatory Visit: Payer: Medicare HMO | Admitting: Internal Medicine

## 2022-12-05 ENCOUNTER — Other Ambulatory Visit: Payer: Self-pay | Admitting: Physician Assistant

## 2022-12-05 VITALS — BP 119/76 | HR 76 | Ht 69.0 in | Wt 127.0 lb

## 2022-12-05 DIAGNOSIS — F17210 Nicotine dependence, cigarettes, uncomplicated: Secondary | ICD-10-CM | POA: Diagnosis not present

## 2022-12-05 DIAGNOSIS — J449 Chronic obstructive pulmonary disease, unspecified: Secondary | ICD-10-CM | POA: Diagnosis not present

## 2022-12-05 DIAGNOSIS — R1319 Other dysphagia: Secondary | ICD-10-CM

## 2022-12-05 MED ORDER — ALBUTEROL SULFATE 1.25 MG/3ML IN NEBU: INHALATION_SOLUTION | RESPIRATORY_TRACT | 5 refills | Status: AC

## 2022-12-05 MED ORDER — STIOLTO RESPIMAT 2.5-2.5 MCG/ACT IN AERS: 2.0000 | INHALATION_SPRAY | Freq: Every day | RESPIRATORY_TRACT | 3 refills | Status: DC

## 2022-12-05 MED ORDER — VENTOLIN HFA 108 (90 BASE) MCG/ACT IN AERS: INHALATION_SPRAY | RESPIRATORY_TRACT | 3 refills | Status: DC

## 2022-12-05 NOTE — Patient Instructions (Signed)
No change in recommendations  The key is to stop smoking completely before smoking completely stops you!  Please schedule a follow up visit in 6  months but call sooner if needed

## 2022-12-06 NOTE — Assessment & Plan Note (Signed)
Counseled re importance of smoking cessation but did not meet time criteria for separate billing    Reviewed LDSCT with him including major emphysema findings and likelihood of progression if continues smoking   F/u q 6 m, sooner prn  Each maintenance medication was reviewed in detail including emphasizing most importantly the difference between maintenance and prns and under what circumstances the prns are to be triggered using an action plan format where appropriate.  Total time for H and P, chart review, counseling, reviewing hfa/ neb  device(s) and generating customized AVS unique to this office visit / same day charting = 31 min

## 2022-12-06 NOTE — Assessment & Plan Note (Addendum)
Active smoker - reports quit smokng 06/2013> resumed by ov 04/11/2016   - Spirometry 08/19/2013  FEV1 1.85 ( 47%) ratio 44  - Spirometry 12/08/2015   FEV1 1.53 (43%)  Ratio 40 off maint rx  - 12/08/2015  After extensive coaching HFA effectiveness =    75% > start symb 160/atrovent qid   - Allergy profile 12/08/15  >  Eos 0.0 /  IgE  347  RAST pos dust/ mold - [facility] AT screen 12/08/15 > MM/ levels ok   - 10/11/2017    changed to spiriva smi   - 05/26/2022  After extensive coaching inhaler device,  effectiveness =  80% from baseline of 50%with smi and severe cough on insp hfa  > try stiolto 2 each am and pred 20 until better then 10 mg daily  Chest LDSCT     07/06/22      Mild diffuse bronchial wall thickening with severe centrilobular and paraseptal emphysema   Group D (now reclassified as E) in terms of symptom/risk and laba/lama/ICS  therefore appropriate rx at this point >>>  stiolto and daily predisone seem to work the best here.  Re SABA :  I spent extra time with pt today reviewing appropriate use of albuterol for prn use on exertion with the following points: 1) saba is for relief of sob that does not improve by walking a slower pace or resting but rather if the pt does not improve after trying this first. 2) If the pt is convinced, as many are, that saba helps recover from activity faster then it's easy to tell if this is the case by re-challenging : ie stop, take the inhaler, then p 5 minutes try the exact same activity (intensity of workload) that just caused the symptoms and see if they are substantially diminished or not after saba 3) if there is an activity that reproducibly causes the symptoms, try the saba 15 min before the activity on alternate days   If in fact the saba really does help, then fine to continue to use it prn but advised may need to look closer at the maintenance regimen being used to achieve better control of airways disease with exertion.   Re prednisone: The goal with a  chronic steroid dependent illness is always arriving at the lowest effective dose that controls the disease/symptoms and not accepting a set "formula" which is based on statistics or guidelines that don't always take into account patient  variability or the natural hx of the dz in every individual patient, which may well vary over time.  For now therefore I recommend the patient maintain  floor of 10 mg and ceiling of 20 mg daily for now

## 2022-12-07 ENCOUNTER — Ambulatory Visit (INDEPENDENT_AMBULATORY_CARE_PROVIDER_SITE_OTHER): Payer: Medicare HMO

## 2022-12-07 DIAGNOSIS — I495 Sick sinus syndrome: Secondary | ICD-10-CM

## 2022-12-12 LAB — CUP PACEART REMOTE DEVICE CHECK
Battery Impedance: 1559 Ohm
Battery Remaining Longevity: 49 mo
Battery Voltage: 2.76 V
Brady Statistic AP VP Percent: 0 %
Brady Statistic AP VS Percent: 26 %
Brady Statistic AS VP Percent: 1 %
Brady Statistic AS VS Percent: 73 %
Date Time Interrogation Session: 20241207103334
Implantable Lead Connection Status: 753985
Implantable Lead Connection Status: 753985
Implantable Lead Implant Date: 20050421
Implantable Lead Implant Date: 20050421
Implantable Lead Location: 753859
Implantable Lead Location: 753860
Implantable Lead Model: 5076
Implantable Lead Model: 5076
Implantable Pulse Generator Implant Date: 20140904
Lead Channel Impedance Value: 460 Ohm
Lead Channel Impedance Value: 536 Ohm
Lead Channel Pacing Threshold Amplitude: 0.875 V
Lead Channel Pacing Threshold Amplitude: 0.875 V
Lead Channel Pacing Threshold Pulse Width: 0.4 ms
Lead Channel Pacing Threshold Pulse Width: 0.4 ms
Lead Channel Setting Pacing Amplitude: 1.75 V
Lead Channel Setting Pacing Amplitude: 3.25 V
Lead Channel Setting Pacing Pulse Width: 0.52 ms
Lead Channel Setting Sensing Sensitivity: 5.6 mV
Zone Setting Status: 755011
Zone Setting Status: 755011

## 2022-12-15 ENCOUNTER — Other Ambulatory Visit: Payer: Self-pay | Admitting: Nurse Practitioner

## 2022-12-15 ENCOUNTER — Other Ambulatory Visit: Payer: Self-pay | Admitting: Physician Assistant

## 2022-12-15 DIAGNOSIS — J209 Acute bronchitis, unspecified: Secondary | ICD-10-CM

## 2022-12-15 DIAGNOSIS — R1319 Other dysphagia: Secondary | ICD-10-CM

## 2023-01-11 ENCOUNTER — Ambulatory Visit: Payer: Medicare HMO | Admitting: Internal Medicine

## 2023-01-11 ENCOUNTER — Other Ambulatory Visit (INDEPENDENT_AMBULATORY_CARE_PROVIDER_SITE_OTHER): Payer: Medicare HMO

## 2023-01-11 VITALS — BP 120/84 | HR 76 | Ht 69.0 in | Wt 123.2 lb

## 2023-01-11 DIAGNOSIS — R634 Abnormal weight loss: Secondary | ICD-10-CM

## 2023-01-11 DIAGNOSIS — R1310 Dysphagia, unspecified: Secondary | ICD-10-CM | POA: Diagnosis not present

## 2023-01-11 DIAGNOSIS — R630 Anorexia: Secondary | ICD-10-CM

## 2023-01-11 DIAGNOSIS — K219 Gastro-esophageal reflux disease without esophagitis: Secondary | ICD-10-CM | POA: Diagnosis not present

## 2023-01-11 DIAGNOSIS — R140 Abdominal distension (gaseous): Secondary | ICD-10-CM

## 2023-01-11 DIAGNOSIS — K449 Diaphragmatic hernia without obstruction or gangrene: Secondary | ICD-10-CM | POA: Diagnosis not present

## 2023-01-11 DIAGNOSIS — R112 Nausea with vomiting, unspecified: Secondary | ICD-10-CM

## 2023-01-11 DIAGNOSIS — R109 Unspecified abdominal pain: Secondary | ICD-10-CM | POA: Diagnosis not present

## 2023-01-11 DIAGNOSIS — R110 Nausea: Secondary | ICD-10-CM | POA: Diagnosis not present

## 2023-01-11 LAB — VITAMIN D 25 HYDROXY (VIT D DEFICIENCY, FRACTURES): VITD: 56.87 ng/mL (ref 30.00–100.00)

## 2023-01-11 LAB — TSH: TSH: 0.23 u[IU]/mL — ABNORMAL LOW (ref 0.35–5.50)

## 2023-01-11 LAB — MAGNESIUM: Magnesium: 1.8 mg/dL (ref 1.5–2.5)

## 2023-01-11 LAB — FOLATE: Folate: 10.9 ng/mL (ref >5.9)

## 2023-01-11 LAB — CORTISOL: Cortisol, Plasma: 5.7 ug/dL

## 2023-01-11 MED ORDER — ONDANSETRON 4 MG PO TBDP: 4.0000 mg | ORAL_TABLET | Freq: Three times a day (TID) | ORAL | 0 refills | Status: AC | PRN

## 2023-01-11 MED ORDER — MIRTAZAPINE 30 MG PO TABS: 30.0000 mg | ORAL_TABLET | Freq: Every day | ORAL | 3 refills | Status: DC

## 2023-01-11 NOTE — Patient Instructions (Signed)
 Your provider has requested that you go to the basement level for lab work before leaving today. Press B on the elevator. The lab is located at the first door on the left as you exit the elevator.  We have sent the following medications to your pharmacy for you to pick up at your convenience: Zofran   Start cutting down on [facility]  Continue drinking Boost supplements aim for 1 a day  Continue taking Omeprazole    You have been given a testing kit to check for small intestine bacterial overgrowth (SIBO) which is completed by a company named [facility]. Make sure to return your test in the mail using the return mailing label given to you along with the kit. The test order, your demographic and insurance information have all already been sent to the company. [facility] will collect an upfront charge of $99.74 for commercial insurance plans and $209.74 if you are paying cash. Make sure to discuss with [facility] PRIOR to having the test to see if they have gotten information from your insurance company as to how much your testing will cost out of pocket, if any. Please contact [facility] at phone number [phone] to get instructions regarding how to perform the test as our office is unable to give specific testing instructions.  You have been scheduled for a gastric emptying scan at [facility] on 01/17/23 at 8 am. Please arrive at least 30 minutes prior to your appointment for registration. Please make certain not to have anything to eat or drink after midnight the night before your test. Hold all stomach medications (ex: Zofran , phenergan , Reglan) 24 hours prior to your test. If you need to reschedule your appointment, please contact radiology scheduling at [phone]. _____________________________________________________________________ A gastric-emptying study measures how long it takes for food to move through your stomach. There are several ways to  measure stomach emptying. In the most common test, you eat food that contains a small amount of radioactive material. A scanner that detects the movement of the radioactive material is placed over your abdomen to monitor the rate at which food leaves your stomach. This test normally takes about 4 hours to complete.    If your blood pressure at your visit was 140/90 or greater, please contact your primary care physician to follow up on this.  _______________________________________________________  If you are age 44 or older, your body mass index should be between 23-30. Your Body mass index is 18.19 kg/m. If this is out of the aforementioned range listed, please consider follow up with your Primary Care Provider.  If you are age 53 or younger, your body mass index should be between 19-25. Your Body mass index is 18.19 kg/m. If this is out of the aformentioned range listed, please consider follow up with your Primary Care Provider.   ________________________________________________________  The [facility] providers would like to encourage you to use MYCHART to communicate with providers for non-urgent requests or questions.  Due to long hold times on the telephone, sending your provider a message by [facility] may be a faster and more efficient way to get a response.  Please allow 48 business hours for a response.  Please remember that this is for non-urgent requests.  _______________________________________________________  Due to recent changes in healthcare laws, you may see the results of your imaging and laboratory studies on MyChart before your provider has had a chance to review them.  We understand that in some cases there may be results that are confusing or  concerning to you. Not all laboratory results come back in the same time frame and the provider may be waiting for multiple results in order to interpret others.  Please give us  48 hours in order for your provider to thoroughly review all  the results before contacting the office for clarification of your results.   Thank you for entrusting me with your care and for choosing [facility], Dr. Estefana Kidney

## 2023-01-11 NOTE — Progress Notes (Signed)
 01/11/2023 Michael [name] [id number] 10-07-1960   HISTORY OF PRESENT ILLNESS:  This is a 63 year old male with history of COPD, anxiety and bradycardia s/p PM who presents for follow-up of abdominal pain, bloating, belching, nausea, and weight loss.  He has had CT scans, EGD, colonoscopy, esophagram, extensive labs that have all been fairly unrevealing.  TSH has previously been low but follow-up free T4 and free T3 have been normal.  Interval History: Patient presents with his mother to clinic today.  His abdominal pain has gotten better. Patient still has poor appetite. Regardless of what he eats, he will feel sick.  He thinks that part of the reason his abdominal pain has gotten better is that he is not eating as much.  Endorses bloating if he eats. Endorse nausea if he eats. He has not vomited recently. He is still losing weight. He might have some dysphagia on occasion. Mirtazapine  has not helped with his appetite yet. Bowel habits are normal. He is having one BM every day. He takes omeprazole  40 mg BID, which seems to keep his acid reflux.  He does like the dosing of twice daily.  Acid reflux is under good control. He is not sure if the omeprazole  has helped with his abdominal pain.  Denies marijuana use. He used to drink 12-24 [facility] and chocolate milkshakes daily. He has tried to cut back on the [facility] and is now drinking 6-12 [employer] per day. He has tried to drink Boost once every few days. Denies alcohol use. Sometimes he has headaches. Endorses feeling lightheaded on occasion. Also has some balance issues. Endorses generalized weakness. Denies dysuria or fevers. He did stop his Flomax  due to some urinary issues and this seemed to correlate with the timing of his abdominal pain resolution.  Patient's father did die in 10/2022, which has been difficult on him.  Wt Readings from Last 3 Encounters:  01/11/23 123 lb 3.2 oz (55.9 kg)  12/05/22 127 lb (57.6 kg)  10/11/22 123 lb  (55.8 kg)   Past Medical History:  Diagnosis Date   Allergy     Anginal pain (HCC)    Anxiety    Arthritis    Bradycardia    CHF (congestive heart failure) (HCC)    Chronic chest pain    COPD (chronic obstructive pulmonary disease) (HCC)    Depression    Emphysema of lung (HCC)    GERD (gastroesophageal reflux disease)    History of nuclear stress test 06/14/2011   lexiscan ; mild perfusion defect in basal inferosetpal & apical inferior region; negative for ischemia    Hypothyroidism    Pacemaker    Pneumonia    Presence of permanent cardiac pacemaker April 2005   For bradycardia   Seizure disorder (HCC)    Seizures (HCC)    as a baby   Shortness of breath    Tobacco abuse    Past Surgical History:  Procedure Laterality Date   CARDIAC CATHETERIZATION  2002, 2003   normal coronaries   COLONOSCOPY     EYE SURGERY  [age] y.o.   GALLBLADDER SURGERY  2003   HAND SURGERY  1998   x3   OPEN REDUCTION INTERNAL FIXATION (ORIF) DISTAL RADIAL FRACTURE Right 09/07/2016   Procedure: RIGHT WRIST OPEN REDUCTION INTERNAL FIXATION (ORIF) REPAIR AS INDICATED;  Surgeon: Shari Easter, MD;  Location: [facility] OR;  Service: Orthopedics;  Laterality: Right;   PACEMAKER INSERTION  04/23/2003   [employer]; [facility] -  symptomatic bradycardia   PERMANENT PACEMAKER GENERATOR CHANGE N/A 09/06/2012   Procedure: PERMANENT PACEMAKER GENERATOR CHANGE;  Surgeon: Jerel Balding, MD;  Location: [facility] CATH LAB;  Service: Cardiovascular;  Laterality: N/A;   TONSILLECTOMY     TRANSTHORACIC ECHOCARDIOGRAM  05/14/2012   EF 50-55%, normal systolic function; mild MR (ordered for mitral valve disease 424.0)    reports that he has been smoking cigarettes. He has a 44 pack-year smoking history. He has never used smokeless tobacco. He reports that he does not drink alcohol and does not use drugs. family history includes CAD in his father; COPD in his father; Heart Problems in his mother and another  family member; Heart attack in his maternal grandmother, paternal grandfather, and paternal grandmother; Heart disease in his father and mother; Hypertension in his maternal grandmother, paternal grandfather, and paternal grandmother; Other in his sister. Allergies  Allergen Reactions   Shellfish Allergy  Anaphylaxis      Outpatient Encounter Medications as of 01/11/2023  Medication Sig   albuterol  (ACCUNEB ) 1.25 MG/3ML nebulizer solution 2.5 mg every 4 hours as needed for cough, shortness of breath and/or wheezing   aspirin  EC 81 MG tablet Take 81 mg by mouth daily. Swallow whole.   benzonatate  (TESSALON ) 200 MG capsule Take 1 capsule by mouth three times daily as needed   desloratadine  (CLARINEX ) 5 MG tablet Take 1 tablet (5 mg total) by mouth daily.   EPINEPHrine  0.3 mg/0.3 mL IJ SOAJ injection Inject 0.3 mg into the muscle as needed for anaphylaxis.   FLUoxetine  (PROZAC ) 40 MG capsule Take 2 capsules (80 mg total) by mouth daily.   fluticasone  (FLONASE ) 50 MCG/ACT nasal spray Place 2 sprays into both nostrils daily.   mirtazapine  (REMERON ) 15 MG tablet Take 1 tablet (15 mg total) by mouth at bedtime.   omeprazole  (PRILOSEC) 40 MG capsule TAKE 1 CAPSULE BY MOUTH TWICE DAILY BEFORE A MEAL   ondansetron  (ZOFRAN ) 4 MG tablet Take 1 tablet (4 mg total) by mouth every 8 (eight) hours as needed for nausea or vomiting.   predniSONE  (DELTASONE ) 10 MG tablet 2 until better then 1 daily   tamsulosin  (FLOMAX ) 0.4 MG CAPS capsule Take 1 capsule (0.4 mg total) by mouth daily.   Tiotropium Bromide -Olodaterol (STIOLTO RESPIMAT ) 2.5-2.5 MCG/ACT AERS Inhale 2 puffs into the lungs daily.   VENTOLIN  HFA 108 (90 Base) MCG/ACT inhaler INHALE UP TO  2 PUFFS BY MOUTH EVERY 4 HOURS AS NEEDED   Vitamin D , Ergocalciferol , (DRISDOL ) 1.25 MG (50000 UNIT) CAPS capsule Take 1 capsule (50,000 Units total) by mouth every Sunday.   No facility-administered encounter medications on file as of 01/11/2023.    PHYSICAL  EXAM: BP 120/84   Pulse 76   Ht 5' 9 (1.753 m)   Wt 123 lb 3.2 oz (55.9 kg)   BMI 18.19 kg/m  General: Thin white male in no acute distress Head: Normocephalic and atraumatic Lungs: No increased work of breathing Heart: Regular rate Abdomen: Soft, non-distended.  BS present.  Nontender. Musculoskeletal: Symmetrical with no gross deformities  Skin: No lesions on visible extremities Extremities: No edema  Neurological: Alert oriented x 4, grossly non-focal Psychological:  Alert and cooperative. Normal mood and affect  Labs 10/2022: TSH low at 0.13. CMP with low glucose of 68. CBC with elevated MCV of 100.4. HbA1C 5.7%.  Free T4 and free T3 were normal.  CT A/P w/contrast 08/15/22: IMPRESSION: 1. No findings to explain the patient's pain other than the possibility of constipation. 2. Enlarged prostate. 3.  Emphysema (ICD10-J43.9). 4.  Aortic atherosclerosis (ICD10-I70.0).  Barium swallow 08/26/22: IMPRESSION: 1. Normal esophagram.  EGD 09/14/22: - Normal esophagus. Dilated. Biopsied. - Small hiatal hernia. - Gastritis. Biopsied. - Duodenitis. Path: 1. Surgical [P], gastric :       - MILD REACTIVE GASTROPATHY.       - NEGATIVE FOR H. PYLORI ON H&E STAIN       - NO INTESTINAL METAPLASIA, DYSPLASIA, OR MALIGNANCY.       2. Surgical [P], esophagus :       - BENIGN SQUAMOUS MUCOSA WITH NO SPECIFIC PATHOLOGIC CHANGES       - NEGATIVE FOR INCREASED INTRAEPITHELIAL EOSINOPHILS       - NEGATIVE FOR DYSPLASIA OR MALIGNANCY   Colonoscopy 09/14/22: - The examined portion of the ileum was normal. - One 5 mm polyp in the transverse colon, removed with a cold snare. Resected and retrieved. - Non- bleeding internal hemorrhoids. Path: 3. Surgical [P], colon, transverse, polyp (1) :       - TUBULAR ADENOMA.       - NO HIGH GRADE DYSPLASIA OR MALIGNANCY.   ASSESSMENT AND PLAN: Abdominal pain -improved Poor appetite Weight loss Bloating Nausea Small hiatal hernia GERD Patient has  continued to have a poor appetite with limited ability to ingest foods further due to nausea, bloating, and abdominal discomfort.  Overall his abdominal pain has improved.  Prior workup has not clearly revealed a source of his symptoms.  He has not responded to mirtazapine  therapy yet so we will increase this to 30 mg nightly to see if this will help further with his symptoms.  Patient has noted improvement in his acid reflux on omeprazole  twice daily so we will maintain this dose for now.  Will also give him some Zofran  to help with nausea symptoms.  Patient does have a very limited diet and drinks extensive amounts of [facility] and chocolate milkshakes.  Thus he may be at risk for malnutrition.  Will check some vitamin levels and other sources of nausea for today.  Will also get him scheduled for gastric emptying study and plan for SIBO breath test for further evaluation.  I did inquire whether or not the patient may be depressed, but the patient declined a referral to a therapist today.  Patient also does note some issues with headaches and balance, but he would like to wait on a referral to neurology. - Will plan for gastric emptying study. If possible, add cheese to the eggs. - SIBO breath test - Check folate, vitamin D , thiamine , cortisol, TSH, free T4, free T3, magnesium  - Cut down on [facility]. Try alternative beverages - Continue on Boost. Aim for 1 per day.  - Start Zofran  ODT 4 mg TID PRN - Continue omeprazole  40 mg BID. Patient was not able to tolerate decreasing every day. Will refill - Increase mirtazapine  from 15 mg to 30 mg QHS - Declined referral to therapist - Consider referral to neurology in the future - RTC in 2-3 months  Estefana Kidney, MD  I spent 45 minutes of time, including in depth chart review, independent review of results as outlined above, communicating results with the patient directly, face-to-face time with the patient, coordinating care, ordering studies and  medications as appropriate, and documentation.

## 2023-01-16 ENCOUNTER — Telehealth: Payer: Self-pay | Admitting: Internal Medicine

## 2023-01-16 NOTE — Telephone Encounter (Signed)
 Patient's mother Michael [name]) states there has been a mix up with Michael [name]'s Ventolin  HFA inhaler prescription.  He used to get a 3 month supply, but now he is only getting 1 inhaler at a time.  She is wondering if we can call the pharmacy and try to straighten this out.  Right now, [name] is completely out of this inhaler.  [facility] on [address]--[phone]    Patient call back [phone]

## 2023-01-16 NOTE — Telephone Encounter (Signed)
 The prescription is written for 54G per dispense. This would be between the pharmacy and insurance if this is not what the patient is receiving.  Last fill 11/08/22 Pending fill 01/15/23 (per pharm not picked up yet)  Attempted to contact patient. Gentleman answered the phone and advised patient is not available at this time. Only person on DPR is mother, Erminio. Asked for him to give patient message to return call.  If patient/mother return call please advise them of above information. Thank you!

## 2023-01-17 ENCOUNTER — Other Ambulatory Visit (HOSPITAL_COMMUNITY): Payer: Medicare HMO

## 2023-01-17 LAB — T4, FREE: Free T4: 1.1 ng/dL (ref 0.8–1.8)

## 2023-01-17 LAB — VITAMIN B1: Vitamin B1 (Thiamine): 8 nmol/L (ref 8–30)

## 2023-01-23 ENCOUNTER — Encounter (HOSPITAL_COMMUNITY)
Admission: RE | Admit: 2023-01-23 | Discharge: 2023-01-23 | Disposition: A | Discharge status: Home / Self Care | Payer: Medicare HMO | Source: Ambulatory Visit | Attending: Internal Medicine | Admitting: Internal Medicine

## 2023-01-23 ENCOUNTER — Other Ambulatory Visit (HOSPITAL_COMMUNITY): Payer: Medicare HMO

## 2023-01-23 DIAGNOSIS — K219 Gastro-esophageal reflux disease without esophagitis: Secondary | ICD-10-CM | POA: Diagnosis not present

## 2023-01-23 DIAGNOSIS — R630 Anorexia: Secondary | ICD-10-CM | POA: Diagnosis not present

## 2023-01-23 DIAGNOSIS — R112 Nausea with vomiting, unspecified: Secondary | ICD-10-CM | POA: Insufficient documentation

## 2023-01-23 DIAGNOSIS — R634 Abnormal weight loss: Secondary | ICD-10-CM | POA: Insufficient documentation

## 2023-01-23 DIAGNOSIS — R1310 Dysphagia, unspecified: Secondary | ICD-10-CM | POA: Insufficient documentation

## 2023-01-23 MED ORDER — TECHNETIUM TC 99M SULFUR COLLOID
2.0000 | Freq: Once | INTRAVENOUS | Status: AC | PRN
  Administered 2023-01-23: 2 via INTRAVENOUS

## 2023-01-26 ENCOUNTER — Telehealth: Payer: Self-pay | Admitting: Internal Medicine

## 2023-01-26 ENCOUNTER — Other Ambulatory Visit: Payer: Self-pay | Admitting: Nurse Practitioner

## 2023-01-26 DIAGNOSIS — J209 Acute bronchitis, unspecified: Secondary | ICD-10-CM

## 2023-01-26 NOTE — Telephone Encounter (Signed)
PT returning call to discuss results. Please advise

## 2023-01-26 NOTE — Telephone Encounter (Signed)
Pt is requesting refill on Tessalon 200mg , this medication is not listed in the last OV note. [name] please advise.

## 2023-01-30 ENCOUNTER — Other Ambulatory Visit: Payer: Self-pay | Admitting: Physician Assistant

## 2023-01-30 DIAGNOSIS — R1319 Other dysphagia: Secondary | ICD-10-CM

## 2023-01-31 ENCOUNTER — Ambulatory Visit: Payer: Medicare HMO

## 2023-02-03 ENCOUNTER — Encounter: Payer: Self-pay | Admitting: Internal Medicine

## 2023-02-06 ENCOUNTER — Other Ambulatory Visit: Payer: Self-pay | Admitting: Gastroenterology

## 2023-02-22 ENCOUNTER — Encounter: Payer: Self-pay | Admitting: Internal Medicine

## 2023-02-28 ENCOUNTER — Other Ambulatory Visit (HOSPITAL_BASED_OUTPATIENT_CLINIC_OR_DEPARTMENT_OTHER): Payer: Self-pay

## 2023-02-28 ENCOUNTER — Other Ambulatory Visit: Payer: Self-pay | Admitting: Gastroenterology

## 2023-03-08 ENCOUNTER — Ambulatory Visit: Payer: Medicare HMO

## 2023-03-08 DIAGNOSIS — I495 Sick sinus syndrome: Secondary | ICD-10-CM | POA: Diagnosis not present

## 2023-03-11 ENCOUNTER — Other Ambulatory Visit: Payer: Self-pay | Admitting: Internal Medicine

## 2023-03-11 DIAGNOSIS — J440 Chronic obstructive pulmonary disease with acute lower respiratory infection: Secondary | ICD-10-CM

## 2023-03-13 LAB — CUP PACEART REMOTE DEVICE CHECK
Battery Impedance: 1584 Ohm
Battery Remaining Longevity: 45 mo
Battery Voltage: 2.76 V
Brady Statistic AP VP Percent: 0 %
Brady Statistic AP VS Percent: 31 %
Brady Statistic AS VP Percent: 1 %
Brady Statistic AS VS Percent: 68 %
Date Time Interrogation Session: 20250307160722
Implantable Lead Connection Status: 753985
Implantable Lead Connection Status: 753985
Implantable Lead Implant Date: 20050421
Implantable Lead Implant Date: 20050421
Implantable Lead Location: 753859
Implantable Lead Location: 753860
Implantable Lead Model: 5076
Implantable Lead Model: 5076
Implantable Pulse Generator Implant Date: 20140904
Lead Channel Impedance Value: 472 Ohm
Lead Channel Impedance Value: 542 Ohm
Lead Channel Pacing Threshold Amplitude: 0.75 V
Lead Channel Pacing Threshold Amplitude: 2 V
Lead Channel Pacing Threshold Pulse Width: 0.4 ms
Lead Channel Pacing Threshold Pulse Width: 0.4 ms
Lead Channel Setting Pacing Amplitude: 1.5 V
Lead Channel Setting Pacing Amplitude: 4 V
Lead Channel Setting Pacing Pulse Width: 0.52 ms
Lead Channel Setting Sensing Sensitivity: 5.6 mV
Zone Setting Status: 755011
Zone Setting Status: 755011

## 2023-04-07 ENCOUNTER — Other Ambulatory Visit: Payer: Self-pay

## 2023-04-07 MED ORDER — MIRTAZAPINE 30 MG PO TABS: 30.0000 mg | ORAL_TABLET | Freq: Every day | ORAL | 1 refills | Status: DC

## 2023-04-17 ENCOUNTER — Ambulatory Visit (INDEPENDENT_AMBULATORY_CARE_PROVIDER_SITE_OTHER): Payer: Medicare HMO

## 2023-04-17 VITALS — BP 126/78 | HR 82 | Temp 98.5°F | Ht 69.0 in | Wt 118.8 lb

## 2023-04-17 DIAGNOSIS — Z0000 Encounter for general adult medical examination without abnormal findings: Secondary | ICD-10-CM | POA: Diagnosis not present

## 2023-04-17 NOTE — Progress Notes (Addendum)
 Subjective:   Michael [name] is a 63 y.o. who presents for a Medicare Wellness preventive visit.  Visit Complete: In person    Persons Participating in Visit: Patient assisted by Mother [name] .  AWV Questionnaire: No: Patient Medicare AWV questionnaire was not completed prior to this visit.  Cardiac Risk Factors include: advanced age (>48men, >86 women);hypertension;male gender     Objective:    Today's Vitals   04/17/23 1107 04/17/23 1108  BP:  126/78  Pulse:  82  Temp:  98.5 F (36.9 C)  SpO2:  96%  Weight:  118 lb 12.8 oz (53.9 kg)  Height:  5\' 9"  (1.753 m)  PainSc: 9     Body mass index is 17.54 kg/m.     04/17/2023   11:17 AM 10/21/2021    3:45 PM 10/01/2021    1:15 PM 09/26/2020   10:09 AM 03/17/2017    7:00 PM 03/16/2017   10:22 PM 09/07/2016   10:19 PM  Advanced Directives  Does Patient Have a Medical Advance Directive? No No No Yes Yes No Yes  Type of [employer] Power of Attorney  Does patient want to make changes to medical advance directive?    No - Patient declined   No - Patient declined  Copy of Healthcare Power of Attorney in Chart?    No - copy requested No - copy requested  No - copy requested  Would patient like information on creating a medical advance directive? No - Patient declined No - Patient declined No - Patient declined        Current Medications (verified) Outpatient Encounter Medications as of 04/17/2023  Medication Sig   albuterol (ACCUNEB) 1.25 MG/3ML nebulizer solution 2.5 mg every 4 hours as needed for cough, shortness of breath and/or wheezing   aspirin EC 81 MG tablet Take 81 mg by mouth daily. Swallow whole.   benzonatate (TESSALON) 200 MG capsule Take 1 capsule by mouth three times daily as needed   desloratadine (CLARINEX) 5 MG tablet Take 1 tablet (5 mg total) by mouth daily.   fluticasone (FLONASE) 50 MCG/ACT nasal spray Place 2 sprays into both  nostrils daily.   mirtazapine (REMERON) 30 MG tablet Take 1 tablet (30 mg total) by mouth at bedtime.   omeprazole (PRILOSEC) 40 MG capsule Take 1 capsule (40 mg total) by mouth 2 (two) times daily.   ondansetron (ZOFRAN-ODT) 4 MG disintegrating tablet Take 1 tablet (4 mg total) by mouth every 8 (eight) hours as needed for nausea or vomiting.   Tiotropium Bromide-Olodaterol (STIOLTO RESPIMAT) 2.5-2.5 MCG/ACT AERS Inhale 2 puffs into the lungs daily.   VENTOLIN HFA 108 (90 Base) MCG/ACT inhaler INHALE UP TO  2 PUFFS BY MOUTH EVERY 4 HOURS AS NEEDED   Vitamin D, Ergocalciferol, (DRISDOL) 1.25 MG (50000 UNIT) CAPS capsule Take 1 capsule (50,000 Units total) by mouth every Sunday.   EPINEPHrine 0.3 mg/0.3 mL IJ SOAJ injection Inject 0.3 mg into the muscle as needed for anaphylaxis. (Patient not taking: Reported on 04/17/2023)   predniSONE (DELTASONE) 10 MG tablet 2 until better then 1 daily (Patient not taking: Reported on 04/17/2023)   [DISCONTINUED] FLUoxetine (PROZAC) 40 MG capsule Take 2 capsules (80 mg total) by mouth daily.   [DISCONTINUED] mirtazapine (REMERON) 15 MG tablet TAKE 1 TABLET BY MOUTH AT BEDTIME   [DISCONTINUED] tamsulosin (FLOMAX) 0.4 MG CAPS capsule Take 1 capsule (0.4 mg total) by mouth  daily.   No facility-administered encounter medications on file as of 04/17/2023.    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   GALLBLADDER SURGERY  2003   HAND SURGERY  1998   x3   OPEN REDUCTION INTERNAL FIXATION (ORIF) DISTAL RADIAL FRACTURE Right 09/07/2016   Procedure: RIGHT WRIST OPEN REDUCTION INTERNAL FIXATION (ORIF) REPAIR AS INDICATED;  Surgeon: Arvil Birks, MD;  Location: [facility] OR;  Service: Orthopedics;  Laterality: Right;   PACEMAKER INSERTION  04/23/2003   [employer] Kappa; [facility] - symptomatic bradycardia   PERMANENT PACEMAKER GENERATOR CHANGE N/A 09/06/2012   Procedure: PERMANENT PACEMAKER GENERATOR CHANGE;  Surgeon: Luana Rumple, MD;  Location: [facility] CATH LAB;  Service: Cardiovascular;  Laterality: N/A;   TONSILLECTOMY     TRANSTHORACIC ECHOCARDIOGRAM  05/14/2012   EF 50-55%, normal systolic function; mild MR (ordered for mitral valve disease 424.0)   Family History  Problem Relation Age of Onset   Heart Problems Mother        MVP   Heart disease Mother    CAD Father    COPD Father    Heart disease Father    Other Sister        crib dealth   Heart attack Maternal Grandmother    Hypertension Maternal Grandmother    Heart attack Paternal Grandmother    Hypertension Paternal Grandmother    Heart attack Paternal Grandfather    Hypertension Paternal Grandfather    Heart Problems Other        aunts x 2 died of heart problems    Social History   Socioeconomic History   Marital status: Divorced    Spouse name: Not on file   Number of children: 2   Years of education: 12   Highest education level: Not on file  Occupational History   Occupation: Disabled  Tobacco Use   Smoking status: Every Day    Current packs/day: 1.00    Average packs/day: 1 pack/day for 44.0 years (44.0 ttl pk-yrs)     Types: Cigarettes   Smokeless tobacco: Never   Tobacco comments:    1ppd, verified by [facility] 05/26/2022  Vaping Use   Vaping status: Never Used  Substance and Sexual Activity   Alcohol use: No   Drug use: Never   Sexual activity: Not Currently  Other Topics Concern   Not on file  Social History Narrative   Not on file   Social Drivers of Health  Financial Resource Strain: Low Risk  (04/17/2023)   Overall Financial Resource Strain (CARDIA)    Difficulty of Paying Living Expenses: Not hard at all  Food Insecurity: No Food Insecurity (04/17/2023)   Hunger Vital Sign    Worried About Running Out of Food in the Last Year: Never true    Ran Out of Food in the Last Year: Never true  Transportation Needs: No Transportation Needs (04/17/2023)   PRAPARE - Administrator, Civil Service (Medical): No    Lack of Transportation (Non-Medical): No  Physical Activity: Inactive (04/17/2023)   Exercise Vital Sign    Days of Exercise per Week: 0 days    Minutes of Exercise per Session: 0 min  Stress: No Stress Concern Present (04/17/2023)   [employer] of Occupational Health - Occupational Stress Questionnaire    Feeling of Stress : Only a little  Social Connections: Socially Isolated (04/17/2023)   Social Connection and Isolation Panel [NHANES]    Frequency of Communication with Friends and Family: More than three times a week    Frequency of Social Gatherings with Friends and Family: More than three times a week    Attends Religious Services: Never    Database administrator or Organizations: No    Attends Engineer, structural: Never    Marital Status: Divorced    Tobacco Counseling Ready to quit: Not Answered Counseling given: Not Answered Tobacco comments: 1ppd, verified by [facility] 05/26/2022    Clinical Intake:  Pre-visit preparation completed: Yes  Pain : 0-10 Pain Score: 9  Pain Type: Chronic pain Pain Location: Wrist Pain Orientation: Right Pain Descriptors  / Indicators: Aching Pain Onset: More than a month ago Pain Frequency: Constant     BMI - recorded: 17.54 Diabetes: No  Lab Results  Component Value Date   HGBA1C 5.7 10/04/2022   HGBA1C 5.7 08/02/2021     How often do you need to have someone help you when you read instructions, pamphlets, or other written materials from your doctor or pharmacy?: 1 - Never  Interpreter Needed?: No  Information entered by :: Lanier Ensign, LPN   Activities of Daily Living     04/17/2023   11:09 AM  In your present state of health, do you have any difficulty performing the following activities:  Hearing? 1  Comment slight HOH  Vision? 0  Difficulty concentrating or making decisions? 0  Walking or climbing stairs? 1  Comment sob and knees  Dressing or bathing? 0  Doing errands, shopping? 0  Preparing Food and eating ? N  Using the Toilet? N  In the past six months, have you accidently leaked urine? N  Do you have problems with loss of bowel control? N  Managing your Medications? N  Managing your Finances? N  Housekeeping or managing your Housekeeping? N    Patient Care Team: [name], MD as PCP - General (Family Medicine) Thurmon Fair, MD as Consulting Physician (Cardiology) [name], MD as Consulting Physician (Pulmonary Disease)  Indicate any recent Medical Services you may have received from other than [facility] providers in the past year (date may be approximate).     Assessment:   This is a routine wellness examination for Flanagan.  Hearing/Vision screen Hearing Screening - Comments:: Pt stated slight HOH  Vision Screening - Comments:: Wears rx glasses - up to date with routine eye exams with Dr Ander Purpura     Goals Addressed  This Visit's Progress    Patient Stated       If you wish to quit smoking, help is available. For free tobacco cessation program offerings call the [facility] at 469-241-9392 or Live Well Line at 214-359-0393. You may also visit [email or web address] or email [email or web address]@ [email or web address] for more information on other programs.   You may also call 1-800-QUIT-NOW (807-548-2936) or visit www.[place].ch or [email or web address] for additional resources on smoking cessation.         Depression Screen     04/17/2023   11:18 AM 10/04/2022   12:54 PM 06/28/2022   11:51 AM 05/06/2022    1:07 PM 05/06/2022    1:05 PM 03/14/2022   11:14 AM 10/21/2021    3:42 PM  PHQ 2/9 Scores  PHQ - 2 Score 1 0 0 0 0 0 0    Fall Risk     04/17/2023   11:20 AM 10/04/2022   12:54 PM 06/28/2022   11:51 AM 05/06/2022    1:05 PM 03/14/2022   11:14 AM  Fall Risk   Falls in the past year? 0 0 0 0 0  Number falls in past yr: 0 0 0 0 0  Injury with Fall? 0 0  0 0  Risk for fall due to : No Fall Risks No Fall Risks No Fall Risks No Fall Risks No Fall Risks  Follow up Falls prevention discussed        MEDICARE RISK AT HOME:  Medicare Risk at Home Any stairs in or around the home?: Yes If so, are there any without handrails?: No Home free of loose throw rugs in walkways, pet beds, electrical cords, etc?: Yes Adequate lighting in your home to reduce risk of falls?: Yes Life alert?: No Use of a cane, walker or w/c?: No Grab bars in the bathroom?: No Shower chair or bench in shower?: No Elevated toilet seat or a handicapped toilet?: No  TIMED UP AND GO:  Was the test performed?  Yes  Length of time to ambulate 10 feet: 10 sec Gait steady and fast without use of assistive device  Cognitive Function: 6CIT completed        04/17/2023   11:20 AM 10/21/2021    3:49 PM  6CIT Screen  What Year? 0 points 0 points  What month? 0 points 0 points  What time? 0 points 0 points  Count back from 20 0 points 0 points  Months in reverse 0 points 0 points  Repeat phrase 0 points 0 points  Total Score 0 points 0 points    Immunizations Immunization History  Administered Date(s) Administered   Influenza Whole  09/13/2021   Influenza, High Dose Seasonal PF 10/11/2017   Influenza, Quadrivalent, Recombinant, Inj, Pf 11/02/2018   Influenza, Seasonal, Injecte, Preservative Fre 10/04/2022   Influenza,inj,Quad PF,6+ Mos 09/02/2013, 08/25/2015, 10/29/2016, 10/06/2020   Influenza,inj,quad, With Preservative 11/02/2018   Influenza-Unspecified 09/23/2019   PFIZER Comirnaty(Gray Top)Covid-19 Tri-Sucrose Vaccine 04/28/2020   PFIZER(Purple Top)SARS-COV-2 Vaccination 03/21/2019, 04/15/2019, 11/02/2019   Pfizer Covid-19 Vaccine Bivalent Booster 48yrs & up 10/21/2020   Pfizer(Comirnaty)Fall Seasonal Vaccine 12 years and older 10/25/2021, 10/20/2022   Pneumococcal Polysaccharide-23 06/15/2013   Respiratory Syncytial Virus Vaccine,Recomb Aduvanted(Arexvy) 10/13/2021   Tdap 07/09/2013, 12/10/2019, 10/01/2021   Zoster Recombinant(Shingrix) 09/26/2019, 12/10/2019    Screening Tests Health Maintenance  Topic Date Due   Pneumococcal Vaccine 36-52 Years old (2 of 2 - PCV) 06/16/2014   COVID-19 Vaccine (8 - Pfizer risk 2024-25 season) 04/20/2023  Lung Cancer Screening  07/06/2023   INFLUENZA VACCINE  08/04/2023   Medicare Annual Wellness (AWV)  04/16/2024   Colonoscopy  09/13/2029   DTaP/Tdap/Td (4 - Td or Tdap) 10/02/2031   Hepatitis C Screening  Completed   HIV Screening  Completed   Zoster Vaccines- Shingrix  Completed   HPV VACCINES  Aged Out   Meningococcal B Vaccine  Aged Out    Health Maintenance  Health Maintenance Due  Topic Date Due   Pneumococcal Vaccine 51-35 Years old (2 of 2 - PCV) 06/16/2014   COVID-19 Vaccine (8 - Pfizer risk 2024-25 season) 04/20/2023   Health Maintenance Items Addressed: See Nurse Notes  Additional Screening:  Vision Screening: Recommended annual ophthalmology exams for early detection of glaucoma and other disorders of the eye.  Dental Screening: Recommended annual dental exams for proper oral hygiene  Community Resource Referral / Chronic Care Management: CRR  required this visit?  No   CCM required this visit?  No     Plan:     I have personally reviewed and noted the following in the patient's chart:   Medical and social history Use of alcohol, tobacco or illicit drugs  Current medications and supplements including opioid prescriptions. Patient is not currently taking opioid prescriptions. Functional ability and status Nutritional status Physical activity Advanced directives List of other physicians Hospitalizations, surgeries, and ER visits in previous 12 months Vitals Screenings to include cognitive, depression, and falls Referrals and appointments  In addition, I have reviewed and discussed with patient certain preventive protocols, quality metrics, and best practice recommendations. A written personalized care plan for preventive services as well as general preventive health recommendations were provided to patient.     Bruno Capri, LPN   04/11/8117   After Visit Summary: (In Person-Printed) AVS printed and given to the patient  Notes: refer to comments in routing

## 2023-04-17 NOTE — Patient Instructions (Signed)
 Michael [name] , Thank you for taking time to come for your Medicare Wellness Visit. I appreciate your ongoing commitment to your health goals. Please review the following plan we discussed and let me know if I can assist you in the future.   Referrals/Orders/Follow-Ups/Clinician Recommendations: If you wish to quit smoking, help is available. For free tobacco cessation program offerings call the [facility] at 4306309605 or Live Well Line at 8187365420. You may also visit [email or web address] or email [email or web address] for more information on other programs.   You may also call 1-800-QUIT-NOW (775-038-7708) or visit www.[place].ch or www.[email or web address] for additional resources on smoking cessation.  Aim for 30 minutes of exercise or brisk walking, 6-8 glasses of water, and 5 servings of fruits and vegetables each day.   This is a list of the screening recommended for you and due dates:  Health Maintenance  Topic Date Due   Pneumococcal Vaccination (2 of 2 - PCV) 06/16/2014   Medicare Annual Wellness Visit  10/22/2022   COVID-19 Vaccine (8 - Pfizer risk 2024-25 season) 04/20/2023   Screening for Lung Cancer  07/06/2023   Flu Shot  08/04/2023   Colon Cancer Screening  09/13/2029   DTaP/Tdap/Td vaccine (4 - Td or Tdap) 10/02/2031   Hepatitis C Screening  Completed   HIV Screening  Completed   Zoster (Shingles) Vaccine  Completed   HPV Vaccine  Aged Out   Meningitis B Vaccine  Aged Out    Advanced directives: (Copy Requested) Please bring a copy of your health care power of attorney and living will to the office to be added to your chart at your convenience. You can mail to [facility] [address] [place], [place] [address] or email to [email or web address]  Next Medicare Annual Wellness Visit scheduled for next year: Yes

## 2023-04-21 NOTE — Progress Notes (Signed)
 Remote pacemaker transmission.

## 2023-04-21 NOTE — Addendum Note (Signed)
 Addended by: Lott Rouleau A on: 04/21/2023 04:12 PM   Modules accepted: Orders

## 2023-05-02 ENCOUNTER — Ambulatory Visit: Payer: Medicare HMO

## 2023-05-11 ENCOUNTER — Emergency Department (HOSPITAL_BASED_OUTPATIENT_CLINIC_OR_DEPARTMENT_OTHER)

## 2023-05-11 ENCOUNTER — Other Ambulatory Visit: Payer: Self-pay

## 2023-05-11 ENCOUNTER — Emergency Department (HOSPITAL_BASED_OUTPATIENT_CLINIC_OR_DEPARTMENT_OTHER)
Admission: EM | Admit: 2023-05-11 | Discharge: 2023-05-11 | Disposition: A | Discharge status: Home / Self Care | Attending: Emergency Medicine | Admitting: Emergency Medicine

## 2023-05-11 ENCOUNTER — Encounter (HOSPITAL_BASED_OUTPATIENT_CLINIC_OR_DEPARTMENT_OTHER): Payer: Self-pay

## 2023-05-11 DIAGNOSIS — F17210 Nicotine dependence, cigarettes, uncomplicated: Secondary | ICD-10-CM | POA: Diagnosis not present

## 2023-05-11 DIAGNOSIS — R0602 Shortness of breath: Secondary | ICD-10-CM | POA: Diagnosis not present

## 2023-05-11 DIAGNOSIS — R079 Chest pain, unspecified: Secondary | ICD-10-CM | POA: Diagnosis not present

## 2023-05-11 DIAGNOSIS — J449 Chronic obstructive pulmonary disease, unspecified: Secondary | ICD-10-CM | POA: Insufficient documentation

## 2023-05-11 DIAGNOSIS — R042 Hemoptysis: Secondary | ICD-10-CM

## 2023-05-11 DIAGNOSIS — J441 Chronic obstructive pulmonary disease with (acute) exacerbation: Secondary | ICD-10-CM | POA: Diagnosis not present

## 2023-05-11 DIAGNOSIS — Z7982 Long term (current) use of aspirin: Secondary | ICD-10-CM | POA: Insufficient documentation

## 2023-05-11 DIAGNOSIS — R918 Other nonspecific abnormal finding of lung field: Secondary | ICD-10-CM | POA: Diagnosis not present

## 2023-05-11 DIAGNOSIS — I77810 Thoracic aortic ectasia: Secondary | ICD-10-CM | POA: Diagnosis not present

## 2023-05-11 HISTORY — DX: Pulmonary mycobacterial infection: A31.0

## 2023-05-11 LAB — CBC WITH DIFFERENTIAL/PLATELET
Abs Immature Granulocytes: 0.02 10*3/uL (ref 0.00–0.07)
Basophils Absolute: 0.1 10*3/uL (ref 0.0–0.1)
Basophils Relative: 1 %
Eosinophils Absolute: 0.1 10*3/uL (ref 0.0–0.5)
Eosinophils Relative: 1 %
HCT: 38.3 % — ABNORMAL LOW (ref 39.0–52.0)
Hemoglobin: 13.2 g/dL (ref 13.0–17.0)
Immature Granulocytes: 0 %
Lymphocytes Relative: 24 %
Lymphs Abs: 2.2 10*3/uL (ref 0.7–4.0)
MCH: 33.8 pg (ref 26.0–34.0)
MCHC: 34.5 g/dL (ref 30.0–36.0)
MCV: 98.2 fL (ref 80.0–100.0)
Monocytes Absolute: 0.7 10*3/uL (ref 0.1–1.0)
Monocytes Relative: 7 %
Neutro Abs: 6.2 10*3/uL (ref 1.7–7.7)
Neutrophils Relative %: 67 %
Platelets: 199 10*3/uL (ref 150–400)
RBC: 3.9 MIL/uL — ABNORMAL LOW (ref 4.22–5.81)
RDW: 12.3 % (ref 11.5–15.5)
WBC: 9.2 10*3/uL (ref 4.0–10.5)
nRBC: 0 % (ref 0.0–0.2)

## 2023-05-11 LAB — BASIC METABOLIC PANEL WITH GFR
Anion gap: 9 (ref 5–15)
BUN: 16 mg/dL (ref 8–23)
CO2: 30 mmol/L (ref 22–32)
Calcium: 9.2 mg/dL (ref 8.9–10.3)
Chloride: 102 mmol/L (ref 98–111)
Creatinine, Ser: 0.97 mg/dL (ref 0.61–1.24)
GFR, Estimated: >60 mL/min (ref >60)
Glucose, Bld: 96 mg/dL (ref 70–99)
Potassium: 3.9 mmol/L (ref 3.5–5.1)
Sodium: 140 mmol/L (ref 135–145)

## 2023-05-11 MED ORDER — IPRATROPIUM-ALBUTEROL 0.5-2.5 (3) MG/3ML IN SOLN
3.0000 mL | Freq: Once | RESPIRATORY_TRACT | Status: AC
  Administered 2023-05-11: 3 mL via RESPIRATORY_TRACT
  Filled 2023-05-11: qty 3

## 2023-05-11 MED ORDER — AEROCHAMBER PLUS FLO-VU MISC
1.0000 | Freq: Once | Status: DC
Start: 2023-05-11 — End: 2023-05-12
  Filled 2023-05-11: qty 1

## 2023-05-11 MED ORDER — ALBUTEROL SULFATE HFA 108 (90 BASE) MCG/ACT IN AERS
2.0000 | INHALATION_SPRAY | RESPIRATORY_TRACT | Status: DC | PRN
  Administered 2023-05-11: 2 via RESPIRATORY_TRACT
  Filled 2023-05-11: qty 6.7

## 2023-05-11 MED ORDER — METHYLPREDNISOLONE SODIUM SUCC 125 MG IJ SOLR
125.0000 mg | Freq: Once | INTRAMUSCULAR | Status: AC
  Administered 2023-05-11: 125 mg via INTRAVENOUS
  Filled 2023-05-11: qty 2

## 2023-05-11 MED ORDER — PREDNISONE 50 MG PO TABS: ORAL_TABLET | ORAL | 0 refills | Status: DC

## 2023-05-11 MED ORDER — ALBUTEROL SULFATE (2.5 MG/3ML) 0.083% IN NEBU
2.5000 mg | INHALATION_SOLUTION | Freq: Once | RESPIRATORY_TRACT | Status: AC
  Administered 2023-05-11: 2.5 mg via RESPIRATORY_TRACT
  Filled 2023-05-11: qty 3

## 2023-05-11 MED ORDER — IOHEXOL 300 MG/ML  SOLN
75.0000 mL | Freq: Once | INTRAMUSCULAR | Status: AC | PRN
  Administered 2023-05-11: 75 mL via INTRAVENOUS

## 2023-05-11 MED ORDER — SODIUM CHLORIDE 0.9 % IV SOLN: INTRAVENOUS | Status: DC

## 2023-05-11 NOTE — ED Provider Notes (Signed)
 [place] EMERGENCY DEPARTMENT AT [facility] Provider Note   CSN: [id number] Arrival date & time: 05/11/23  2104     History  No chief complaint on file.   Michael [name] is a 63 y.o. male.  63 year old male presents with hemoptysis x 24 hours.  Patient states he had this before in the past and is continuously gotten worse.  States tonight he coughed up a copious amount of blood.  Slight shortness of breath.  Patient does not take any blood thinners.  Does have a history of COPD.  No prior history of lung cancer or bleeding disorders.       Home Medications Prior to Admission medications   Medication Sig Start Date End Date Taking? Authorizing Provider  albuterol  (ACCUNEB ) 1.25 MG/3ML nebulizer solution 2.5 mg every 4 hours as needed for cough, shortness of breath and/or wheezing 12/05/22   Diamond Formica, MD  aspirin EC 81 MG tablet Take 81 mg by mouth daily. Swallow whole.    [provider]  benzonatate  (TESSALON ) 200 MG capsule Take 1 capsule by mouth three times daily as needed 01/26/23   Diamond Formica, MD  desloratadine  (CLARINEX ) 5 MG tablet Take 1 tablet (5 mg total) by mouth daily. 08/29/22   Artice Last, MD  EPINEPHrine  0.3 mg/0.3 mL IJ SOAJ injection Inject 0.3 mg into the muscle as needed for anaphylaxis. Patient not taking: Reported on 04/17/2023 10/26/21   Rodney Clamp, MD  fluticasone  (FLONASE ) 50 MCG/ACT nasal spray Place 2 sprays into both nostrils daily. 08/29/22   Soldatova, Liuba, MD  mirtazapine  (REMERON ) 30 MG tablet Take 1 tablet (30 mg total) by mouth at bedtime. 04/07/23   Daina Drum, MD  omeprazole  (PRILOSEC) 40 MG capsule Take 1 capsule (40 mg total) by mouth 2 (two) times daily. 01/30/23   Daina Drum, MD  ondansetron  (ZOFRAN -ODT) 4 MG disintegrating tablet Take 1 tablet (4 mg total) by mouth every 8 (eight) hours as needed for nausea or vomiting. 01/11/23   Daina Drum, MD  predniSONE  (DELTASONE ) 10 MG tablet 2 until better  then 1 daily Patient not taking: Reported on 04/17/2023 05/26/22   Diamond Formica, MD  Tiotropium Bromide -Olodaterol (STIOLTO RESPIMAT ) 2.5-2.5 MCG/ACT AERS Inhale 2 puffs into the lungs daily. 12/05/22   Diamond Formica, MD  VENTOLIN  HFA 108 (90 Base) MCG/ACT inhaler INHALE UP TO  2 PUFFS BY MOUTH EVERY 4 HOURS AS NEEDED 12/05/22   Wert, Michael B, MD  Vitamin D , Ergocalciferol , (DRISDOL ) 1.25 MG (50000 UNIT) CAPS capsule Take 1 capsule (50,000 Units total) by mouth every Sunday. 10/03/21   Colin Dawley, MD      Allergies    Shellfish allergy  and Amitriptyline    Review of Systems   Review of Systems  All other systems reviewed and are negative.   Physical Exam Updated Vital Signs BP (!) 145/92   Pulse 77   Resp 15   Ht 1.753 m (5\' 9" )   Wt 53.5 kg   SpO2 94%   BMI 17.43 kg/m  Physical Exam Vitals and nursing note reviewed.  Constitutional:      General: He is not in acute distress.    Appearance: Normal appearance. He is well-developed. He is not toxic-appearing.  HENT:     Head: Normocephalic and atraumatic.  Eyes:     General: Lids are normal.     Conjunctiva/sclera: Conjunctivae normal.     Pupils: Pupils are equal, round, and reactive to  light.  Neck:     Thyroid : No thyroid  mass.     Trachea: No tracheal deviation.  Cardiovascular:     Rate and Rhythm: Normal rate and regular rhythm.     Heart sounds: Normal heart sounds. No murmur heard.    No gallop.  Pulmonary:     Effort: Pulmonary effort is normal. No respiratory distress.     Breath sounds: No stridor. No decreased breath sounds, wheezing, rhonchi or rales.     Comments: Distant breath sounds bilateral Abdominal:     General: There is no distension.     Palpations: Abdomen is soft.     Tenderness: There is no abdominal tenderness. There is no rebound.  Musculoskeletal:        General: No tenderness. Normal range of motion.     Cervical back: Normal range of motion and neck supple.  Skin:    General:  Skin is warm and dry.     Findings: No abrasion or rash.  Neurological:     Mental Status: He is alert and oriented to person, place, and time. Mental status is at baseline.     GCS: GCS eye subscore is 4. GCS verbal subscore is 5. GCS motor subscore is 6.     Cranial Nerves: No cranial nerve deficit.     Sensory: No sensory deficit.     Motor: Motor function is intact.  Psychiatric:        Attention and Perception: Attention normal.        Speech: Speech normal.        Behavior: Behavior normal.     ED Results / Procedures / Treatments   Labs (all labs ordered are listed, but only abnormal results are displayed) Labs Reviewed  CBC WITH DIFFERENTIAL/PLATELET  BASIC METABOLIC PANEL WITH GFR    EKG EKG Interpretation Date/Time:  Thursday May 11 2023 21:13:18 EDT Ventricular Rate:  75 PR Interval:  127 QRS Duration:  88 QT Interval:  397 QTC Calculation: 444 R Axis:   92  Text Interpretation: Sinus rhythm Ventricular premature complex Right axis deviation Nonspecific T abnrm, anterolateral leads Minimal ST elevation, inferior leads Confirmed by Lind Repine ([id number]) on 05/11/2023 9:16:18 PM  Radiology No results found.  Procedures Procedures    Medications Ordered in ED Medications  0.9 %  sodium chloride  infusion (has no administration in time range)    ED Course/ Medical Decision Making/ A&P                                 Medical Decision Making Amount and/or Complexity of Data Reviewed Labs: ordered. Radiology: ordered.  Risk Prescription drug management.   Patient here with hemoptysis.  Concern for possible malignancy due to his continued use of tobacco products.  Chest x-ray showed no acute findings.  Chest CT showed new pulmonary nodules.  This was indicated with patient for follow-up.  Patient continues to smoke cigarettes and will give a butyryl treatment here along with Solu-Medrol .  Suspect mild COPD exacerbation.  He is not on blood thinners.  He  relates to me that he has had this hemoptysis for quite some time.  He does have a pulmonologist, Dr. Waymond Hailey.  He was instructed to follow-up with him.        Final Clinical Impression(s) / ED Diagnoses Final diagnoses:  None    Rx / DC Orders ED Discharge Orders     None  Lind Repine, MD 05/11/23 [phone]

## 2023-05-11 NOTE — Discharge Instructions (Addendum)
 Call your pulmonary doctor to schedule a follow-up visit tomorrow

## 2023-05-11 NOTE — ED Triage Notes (Signed)
 Presents from home c/o 'coughing up thick, dark red blood and blood clots' intermittently since 7pm tonight. Denies thinners. Endorses SHOB, CP, LLQ/LUQ abd pain. Hx emphysema, COPD.

## 2023-05-22 DIAGNOSIS — M79642 Pain in left hand: Secondary | ICD-10-CM | POA: Diagnosis not present

## 2023-05-22 DIAGNOSIS — M1812 Unilateral primary osteoarthritis of first carpometacarpal joint, left hand: Secondary | ICD-10-CM | POA: Diagnosis not present

## 2023-05-22 DIAGNOSIS — M25532 Pain in left wrist: Secondary | ICD-10-CM | POA: Diagnosis not present

## 2023-05-28 NOTE — Progress Notes (Addendum)
 Subjective:     Patient ID: Michael [name], male   DOB: 10/26/1960      MRN: [id number]    Brief patient profile:  63yowm MM/active smoker referred to pulmonary clinic 12/08/2015 by Dr  Michael [name] with GOLD III copd documented 08/19/13     History of Present Illness  10/11/2017  f/u ov/Michael [name] re: COPD III/ still smoking maint on symb/ spiriva  dpi  Chief Complaint  Patient presents with   Follow-up    Breathing "depends on the heat outside"- he is unsure of meds and states "needs refills on everything".    Dyspnea:  MMRC3 = can't walk 100 yards even at a slow pace at a flat grade s stopping due to sob   Cough: provoked  with active smoking and ? From dpi spiriva  also/ min mucoid  Sleeping: bed flat with a couple of pillows  SABA use: way too much when weather hot  02: none  rec Change spiriva  to Respimat form  and use 2 pffs first thing in am only to follow the symbicort  x 2 puffs only in the am, then take the other 2 pffs of symbicort  12 hours later  Work on inhaler technique:  The key is to stop smoking completely before smoking completely stops you!  Please schedule a follow up visit in 3 months but call sooner if needed  with all medications      05/26/2022  f/u ov/Michael [name]/Michael [name] re: GOLD 3 copd maint on symbicort  /spiriva / pred 10 mg daily   still smoking Chief Complaint  Patient presents with   Follow-up    Pt f/u states that he is still wheezing in the morning and having a productive cough w/ ranging colors. Currently smoking 1ppd - requesting refills on all inhalers along w/ flonase , omeprazole  &   Dyspnea:  walking around property slow pace  Cough: none in the last week  prior to OV   Sleeping: couch propped up on arm and pillow SABA use: one or twice every night / neb once at night  02: none  Rec Pepcid  20 mg after 1st and last meals GERD diet reviewed, bed blocks rec  Plan A = Automatic = Always=    stiolto 2 puffs each am and prednisone  10 mg 2 daily until better  then one daily  Work on inhaler technique: >>>  Remember how golfers warm up by taking practice swings - do this with an empty inhaler  Plan B = Backup (to supplement plan A, not to replace it) Only use your albuterol  inhaler as a rescue medication  Plan C = Crisis (instead of Plan B but only if Plan B stops working) - only use your albuterol  nebulizer if you first try Plan B and it fails to help > ok to use the nebulizer up to every 4 hours but if start needing it regularly call for immediate appointment    08/25/2022 3 m  f/u ov/Michael [name]/Michael [name] re: GOLD 3 copd maint on Stiolto  / pred 20 mg until better, then 10 mg maint  Chief Complaint  Patient presents with   COPD    Gold 3  Dyspnea:  slow pace walking  Cough: bad noct x month variably colored  Sleeping: x months has breathing attacks nightly of coughing/sob calm down p an hour, has never tried neb saba   SABA use: hfa only  02: none   Rec The key is to stop smoking completely before smoking completely stops you!  ONO RA no desats 09/06/22   12/05/2022  3 m f/u ov/Michael [name]/Michael [name] re: GOLD 3 copd  maint on stiolto and pred 10 mg ddaily / still smoking   Chief Complaint  Patient presents with   Shortness of Breath   COPD   Dyspnea:  MMRC2 = can't walk a nl pace on a flat grade s sob but does fine slow and flat eg  Cough: white esp in am  Sleeping  more rarely needing saba  SABA use: varies hfa/ neb s clear plan  02: none  Lung cancer screening: q July    06/02/2023  f/u ov/Michael [name]/Michael [name] re: *** maint on ***  No chief complaint on file.   Dyspnea:  *** Cough: *** Sleeping: ***   resp cc  SABA use: *** 02: ***  Lung cancer screening: ***   No obvious day to day or daytime variability or assoc excess/ purulent sputum or mucus plugs or hemoptysis or cp or chest tightness, subjective wheeze or overt sinus or hb symptoms.    Also denies any obvious fluctuation of symptoms with weather or  environmental changes or other aggravating or alleviating factors except as outlined above   No unusual exposure hx or h/o childhood pna/ asthma or knowledge of premature birth.  Current Allergies, Complete Past Medical History, Past Surgical History, Family History, and Social History were reviewed in [employer] record.  ROS  The following are not active complaints unless bolded Hoarseness, sore throat, dysphagia, dental problems, itching, sneezing,  nasal congestion or discharge of excess mucus or purulent secretions, ear ache,   fever, chills, sweats, unintended wt loss or wt gain, classically pleuritic or exertional cp,  orthopnea pnd or arm/hand swelling  or leg swelling, presyncope, palpitations, abdominal pain, anorexia, nausea, vomiting, diarrhea  or change in bowel habits or change in bladder habits, change in stools or change in urine, dysuria, hematuria,  rash, arthralgias, visual complaints, headache, numbness, weakness or ataxia or problems with walking or coordination,  change in mood or  memory.        No outpatient medications have been marked as taking for the 06/02/23 encounter (Appointment) with Michael Formica, MD.                  Objective:   Physical Exam  Wts  06/02/2023        ***  12/05/2022       127  08/25/2022      126   05/26/2022      125  10/13/2021       133  04/15/2021         136  10/06/2020         135  10/04/2019         126  02/19/2019         137  01/12/2018       137  10/11/2017       131  07/11/2017         125  01/11/2017         139  07/11/2016         144   04/11/2016        147          12/08/15 157 lb 3.2 oz (71.3 kg)  11/04/15 153 lb 12.8 oz (69.8 kg)  08/25/15 160 lb 12.8 oz (72.9 kg)    Vital signs reviewed  06/02/2023  - Note at rest 02 sats  ***%  on ***   General appearance:    ***     Mod bar***    Assessment:

## 2023-06-02 ENCOUNTER — Encounter: Payer: Self-pay | Admitting: Internal Medicine

## 2023-06-02 ENCOUNTER — Ambulatory Visit: Admitting: Internal Medicine

## 2023-06-02 VITALS — BP 112/70 | HR 76 | Ht 69.0 in | Wt 115.8 lb

## 2023-06-02 DIAGNOSIS — F17210 Nicotine dependence, cigarettes, uncomplicated: Secondary | ICD-10-CM | POA: Diagnosis not present

## 2023-06-02 DIAGNOSIS — J449 Chronic obstructive pulmonary disease, unspecified: Secondary | ICD-10-CM | POA: Diagnosis not present

## 2023-06-02 MED ORDER — PREDNISONE 10 MG PO TABS: ORAL_TABLET | ORAL | 2 refills | Status: DC

## 2023-06-02 MED ORDER — NICOTINE 7 MG/24HR TD PT24: 7.0000 mg | MEDICATED_PATCH | Freq: Every day | TRANSDERMAL | 1 refills | Status: DC

## 2023-06-02 NOTE — Patient Instructions (Signed)
 Plan A = Automatic = Always=    stiolto 2 puffs each am and prednisone  10 mg 2 daily until better then one daily   Work on inhaler technique:  relax and gently blow all the way out then take a nice smooth full deep breath back in, triggering the inhaler at same time you start breathing in.  Hold breath in for at least  5 seconds if you can.   Rinse and gargle with water when done.  If mouth or throat bother you at all,  try brushing teeth/gums/tongue with arm and hammer toothpaste/ make a slurry and gargle and spit out.   >>>  Remember how golfers warm up by taking practice swings - do this with an empty inhaler   Plan B = Backup (to supplement plan A, not to replace it) Only use your albuterol  inhaler as a rescue medication to be used if you can't catch your breath by resting or doing a relaxed purse lip breathing pattern.  - The less you use it, the better it will work when you need it. - Ok to use the inhaler up to 2 puffs  every 4 hours if you must but call for appointment if use goes up over your usual need - Don't leave home without it !!  (think of it like the spare tire for your car)   Plan C = Crisis (instead of Plan B but only if Plan B stops working) - only use your albuterol  nebulizer if you first try Plan B and it fails to help > ok to use the nebulizer up to every 4 hours but if start needing it regularly call for immediate appointment   Nicotine  7 mg patch  Please schedule a follow up visit in 3 months but call sooner if needed

## 2023-06-02 NOTE — Assessment & Plan Note (Signed)
 4-5 min discussion re active cigarette smoking in addition to office E&M  Ask about tobacco use:   ongoing Advise quitting   I took an extended  opportunity with this patient to outline the consequences of continued cigarette use  in airway disorders based on all the data we have from the multiple national lung health studies (perfomed over decades at millions of dollars in cost)  indicating that smoking cessation, not choice of inhalers or pulmonary physicians, is the most important aspect of his  care.   Assess willingness:  ? committed at this point Assist in quit attempt:   advised on approp use of Nicotine  patches to entirely replace daily cigs when ready to stop Arrange follow up:   Follow up per Primary Care planned

## 2023-06-02 NOTE — Assessment & Plan Note (Addendum)
 Active smoker - reports quit smokng 06/2013> resumed by ov 04/11/2016   - Spirometry 08/19/2013  FEV1 1.85 ( 47%) ratio 44  - Spirometry 12/08/2015   FEV1 1.53 (43%)  Ratio 40 off maint rx  - 12/08/2015  After extensive coaching HFA effectiveness =    75% > start symb 160/atrovent  qid   - Allergy  profile 12/08/15  >  Eos 0.0 /  IgE  347  RAST pos dust/ mold - [facility] AT screen 12/08/15 > MM/ levels ok   - 10/11/2017    changed to spiriva  smi   - 05/26/2022  After extensive coaching inhaler device,  effectiveness =  80% from baseline of 50%with smi and severe cough on insp hfa  > try stiolto 2 each am and pred 20 until better then 10 mg daily  - Chest LDSCT     07/06/22    severe centrilobular  and paraseptal emphysema -   06/02/2023  After extensive coaching inhaler device,  effectiveness =    75% (short Ti) and restart prednisone  x 20 until better and 10 mg daily     Best rx to date:  stiolto 2 each am and maint prednisone  with ceiling of 20 mg and base of 10 mg with approp use of saba  Re SABA :  I spent extra time with pt today reviewing appropriate use of albuterol  for prn use on exertion with the following points: 1) saba is for relief of sob that does not improve by walking a slower pace or resting but rather if the pt does not improve after trying this first. 2) If the pt is convinced, as many are, that saba helps recover from activity faster then it's easy to tell if this is the case by re-challenging : ie stop, take the inhaler, then p 5 minutes try the exact same activity (intensity of workload) that just caused the symptoms and see if they are substantially diminished or not after saba 3) if there is an activity that reproducibly causes the symptoms, try the saba 15 min before the activity on alternate days   If in fact the saba really does help, then fine to continue to use it prn but advised may need to look closer at the maintenance regimen (stiolto/pred)  being used to achieve better control  of airways disease with exertion.          Each maintenance medication was reviewed in detail including emphasizing most importantly the difference between maintenance and prns and under what circumstances the prns are to be triggered using an action plan format where appropriate.  Total time for H and P, chart review, counseling, reviewing  device(s) and generating customized AVS unique to this office visit / same day charting = 35 min

## 2023-06-07 ENCOUNTER — Ambulatory Visit (INDEPENDENT_AMBULATORY_CARE_PROVIDER_SITE_OTHER): Payer: Medicare HMO

## 2023-06-07 DIAGNOSIS — I495 Sick sinus syndrome: Secondary | ICD-10-CM

## 2023-06-15 ENCOUNTER — Other Ambulatory Visit: Payer: Self-pay | Admitting: Family Medicine

## 2023-06-15 DIAGNOSIS — R3911 Hesitancy of micturition: Secondary | ICD-10-CM

## 2023-06-20 LAB — CUP PACEART REMOTE DEVICE CHECK
Battery Impedance: 1845 Ohm
Battery Remaining Longevity: 43 mo
Battery Voltage: 2.75 V
Brady Statistic AP VP Percent: 0 %
Brady Statistic AP VS Percent: 28 %
Brady Statistic AS VP Percent: 1 %
Brady Statistic AS VS Percent: 71 %
Date Time Interrogation Session: 20250617084759
Implantable Lead Connection Status: 753985
Implantable Lead Connection Status: 753985
Implantable Lead Implant Date: 20050421
Implantable Lead Implant Date: 20050421
Implantable Lead Location: 753859
Implantable Lead Location: 753860
Implantable Lead Model: 5076
Implantable Lead Model: 5076
Implantable Pulse Generator Implant Date: 20140904
Lead Channel Impedance Value: 435 Ohm
Lead Channel Impedance Value: 541 Ohm
Lead Channel Pacing Threshold Amplitude: 0.625 V
Lead Channel Pacing Threshold Amplitude: 0.875 V
Lead Channel Pacing Threshold Pulse Width: 0.4 ms
Lead Channel Pacing Threshold Pulse Width: 0.4 ms
Lead Channel Setting Pacing Amplitude: 1.5 V
Lead Channel Setting Pacing Amplitude: 3.25 V
Lead Channel Setting Pacing Pulse Width: 0.4 ms
Lead Channel Setting Sensing Sensitivity: 5.6 mV
Zone Setting Status: 755011
Zone Setting Status: 755011

## 2023-06-28 ENCOUNTER — Ambulatory Visit: Payer: Self-pay | Admitting: Cardiovascular Disease

## 2023-07-01 ENCOUNTER — Encounter (HOSPITAL_BASED_OUTPATIENT_CLINIC_OR_DEPARTMENT_OTHER): Payer: Self-pay | Admitting: Emergency Medicine

## 2023-07-01 ENCOUNTER — Inpatient Hospital Stay (HOSPITAL_BASED_OUTPATIENT_CLINIC_OR_DEPARTMENT_OTHER)
Admission: EM | Admit: 2023-07-01 | Discharge: 2023-07-04 | DRG: 189 | Disposition: A | Discharge status: Home / Self Care | Attending: Internal Medicine | Admitting: Internal Medicine

## 2023-07-01 ENCOUNTER — Emergency Department (HOSPITAL_BASED_OUTPATIENT_CLINIC_OR_DEPARTMENT_OTHER)

## 2023-07-01 ENCOUNTER — Other Ambulatory Visit: Payer: Self-pay

## 2023-07-01 DIAGNOSIS — Z79899 Other long term (current) drug therapy: Secondary | ICD-10-CM | POA: Diagnosis not present

## 2023-07-01 DIAGNOSIS — Z8249 Family history of ischemic heart disease and other diseases of the circulatory system: Secondary | ICD-10-CM

## 2023-07-01 DIAGNOSIS — Z91013 Allergy to seafood: Secondary | ICD-10-CM | POA: Diagnosis not present

## 2023-07-01 DIAGNOSIS — J440 Chronic obstructive pulmonary disease with acute lower respiratory infection: Secondary | ICD-10-CM | POA: Diagnosis present

## 2023-07-01 DIAGNOSIS — J449 Chronic obstructive pulmonary disease, unspecified: Secondary | ICD-10-CM | POA: Diagnosis present

## 2023-07-01 DIAGNOSIS — Z888 Allergy status to other drugs, medicaments and biological substances status: Secondary | ICD-10-CM

## 2023-07-01 DIAGNOSIS — G40909 Epilepsy, unspecified, not intractable, without status epilepticus: Secondary | ICD-10-CM | POA: Diagnosis present

## 2023-07-01 DIAGNOSIS — K219 Gastro-esophageal reflux disease without esophagitis: Secondary | ICD-10-CM | POA: Diagnosis present

## 2023-07-01 DIAGNOSIS — J439 Emphysema, unspecified: Secondary | ICD-10-CM | POA: Diagnosis present

## 2023-07-01 DIAGNOSIS — I509 Heart failure, unspecified: Secondary | ICD-10-CM | POA: Diagnosis present

## 2023-07-01 DIAGNOSIS — R64 Cachexia: Secondary | ICD-10-CM | POA: Diagnosis present

## 2023-07-01 DIAGNOSIS — B9689 Other specified bacterial agents as the cause of diseases classified elsewhere: Secondary | ICD-10-CM | POA: Diagnosis not present

## 2023-07-01 DIAGNOSIS — R0602 Shortness of breath: Secondary | ICD-10-CM | POA: Diagnosis present

## 2023-07-01 DIAGNOSIS — F17210 Nicotine dependence, cigarettes, uncomplicated: Secondary | ICD-10-CM | POA: Diagnosis present

## 2023-07-01 DIAGNOSIS — J441 Chronic obstructive pulmonary disease with (acute) exacerbation: Secondary | ICD-10-CM | POA: Diagnosis present

## 2023-07-01 DIAGNOSIS — R0902 Hypoxemia: Secondary | ICD-10-CM

## 2023-07-01 DIAGNOSIS — F32A Depression, unspecified: Secondary | ICD-10-CM | POA: Diagnosis present

## 2023-07-01 DIAGNOSIS — E43 Unspecified severe protein-calorie malnutrition: Secondary | ICD-10-CM | POA: Diagnosis present

## 2023-07-01 DIAGNOSIS — J929 Pleural plaque without asbestos: Secondary | ICD-10-CM | POA: Diagnosis not present

## 2023-07-01 DIAGNOSIS — E039 Hypothyroidism, unspecified: Secondary | ICD-10-CM | POA: Diagnosis present

## 2023-07-01 DIAGNOSIS — R739 Hyperglycemia, unspecified: Secondary | ICD-10-CM | POA: Diagnosis present

## 2023-07-01 DIAGNOSIS — J9601 Acute respiratory failure with hypoxia: Secondary | ICD-10-CM | POA: Diagnosis present

## 2023-07-01 DIAGNOSIS — Z950 Presence of cardiac pacemaker: Secondary | ICD-10-CM

## 2023-07-01 DIAGNOSIS — Z7952 Long term (current) use of systemic steroids: Secondary | ICD-10-CM

## 2023-07-01 DIAGNOSIS — I110 Hypertensive heart disease with heart failure: Secondary | ICD-10-CM | POA: Diagnosis present

## 2023-07-01 DIAGNOSIS — T380X5A Adverse effect of glucocorticoids and synthetic analogues, initial encounter: Secondary | ICD-10-CM | POA: Diagnosis present

## 2023-07-01 DIAGNOSIS — F418 Other specified anxiety disorders: Secondary | ICD-10-CM | POA: Diagnosis present

## 2023-07-01 DIAGNOSIS — Z1152 Encounter for screening for COVID-19: Secondary | ICD-10-CM

## 2023-07-01 DIAGNOSIS — Z825 Family history of asthma and other chronic lower respiratory diseases: Secondary | ICD-10-CM | POA: Diagnosis not present

## 2023-07-01 DIAGNOSIS — Z681 Body mass index (BMI) 19 or less, adult: Secondary | ICD-10-CM

## 2023-07-01 DIAGNOSIS — J208 Acute bronchitis due to other specified organisms: Secondary | ICD-10-CM | POA: Diagnosis not present

## 2023-07-01 DIAGNOSIS — J479 Bronchiectasis, uncomplicated: Secondary | ICD-10-CM | POA: Diagnosis not present

## 2023-07-01 DIAGNOSIS — F419 Anxiety disorder, unspecified: Secondary | ICD-10-CM | POA: Diagnosis present

## 2023-07-01 DIAGNOSIS — J9612 Chronic respiratory failure with hypercapnia: Secondary | ICD-10-CM | POA: Diagnosis present

## 2023-07-01 LAB — I-STAT ARTERIAL BLOOD GAS, ED
Acid-Base Excess: 3 mmol/L — ABNORMAL HIGH (ref 0.0–2.0)
Bicarbonate: 30 mmol/L — ABNORMAL HIGH (ref 20.0–28.0)
Calcium, Ion: 1.21 mmol/L (ref 1.15–1.40)
HCT: 43 % (ref 39.0–52.0)
Hemoglobin: 14.6 g/dL (ref 13.0–17.0)
O2 Saturation: 92 %
Patient temperature: 98.4
Potassium: 3.6 mmol/L (ref 3.5–5.1)
Sodium: 140 mmol/L (ref 135–145)
TCO2: 32 mmol/L (ref 22–32)
pCO2 arterial: 54.5 mmHg — ABNORMAL HIGH (ref 32–48)
pH, Arterial: 7.348 — ABNORMAL LOW (ref 7.35–7.45)
pO2, Arterial: 69 mmHg — ABNORMAL LOW (ref 83–108)

## 2023-07-01 LAB — CBC
HCT: 41.9 % (ref 39.0–52.0)
Hemoglobin: 13.9 g/dL (ref 13.0–17.0)
MCH: 33.9 pg (ref 26.0–34.0)
MCHC: 33.2 g/dL (ref 30.0–36.0)
MCV: 102.2 fL — ABNORMAL HIGH (ref 80.0–100.0)
Platelets: 195 10*3/uL (ref 150–400)
RBC: 4.1 MIL/uL — ABNORMAL LOW (ref 4.22–5.81)
RDW: 13.4 % (ref 11.5–15.5)
WBC: 15.6 10*3/uL — ABNORMAL HIGH (ref 4.0–10.5)
nRBC: 0 % (ref 0.0–0.2)

## 2023-07-01 LAB — BASIC METABOLIC PANEL WITH GFR
Anion gap: 8 (ref 5–15)
BUN: 22 mg/dL (ref 8–23)
CO2: 30 mmol/L (ref 22–32)
Calcium: 8.9 mg/dL (ref 8.9–10.3)
Chloride: 103 mmol/L (ref 98–111)
Creatinine, Ser: 0.85 mg/dL (ref 0.61–1.24)
GFR, Estimated: >60 mL/min (ref >60)
Glucose, Bld: 90 mg/dL (ref 70–99)
Potassium: 3.8 mmol/L (ref 3.5–5.1)
Sodium: 141 mmol/L (ref 135–145)

## 2023-07-01 LAB — RESP PANEL BY RT-PCR (RSV, FLU A&B, COVID)  RVPGX2
Influenza A by PCR: NEGATIVE
Influenza B by PCR: NEGATIVE
Resp Syncytial Virus by PCR: NEGATIVE
SARS Coronavirus 2 by RT PCR: NEGATIVE

## 2023-07-01 LAB — TROPONIN T, HIGH SENSITIVITY: Troponin T High Sensitivity: <15 ng/L (ref <19)

## 2023-07-01 LAB — PRO BRAIN NATRIURETIC PEPTIDE: Pro Brain Natriuretic Peptide: 128 pg/mL (ref <300.0)

## 2023-07-01 MED ORDER — SODIUM CHLORIDE 0.9 % IV SOLN
1.0000 g | Freq: Once | INTRAVENOUS | Status: AC
  Administered 2023-07-01: 1 g via INTRAVENOUS
  Filled 2023-07-01: qty 10

## 2023-07-01 MED ORDER — ALBUTEROL SULFATE (2.5 MG/3ML) 0.083% IN NEBU
2.5000 mg | INHALATION_SOLUTION | RESPIRATORY_TRACT | Status: AC
  Administered 2023-07-01: 2.5 mg via RESPIRATORY_TRACT
  Filled 2023-07-01: qty 3

## 2023-07-01 MED ORDER — MAGNESIUM SULFATE 2 GM/50ML IV SOLN
2.0000 g | Freq: Once | INTRAVENOUS | Status: AC
  Administered 2023-07-01: 2 g via INTRAVENOUS
  Filled 2023-07-01: qty 50

## 2023-07-01 MED ORDER — LORAZEPAM 2 MG/ML IJ SOLN
0.5000 mg | Freq: Once | INTRAMUSCULAR | Status: AC
  Administered 2023-07-01: 0.5 mg via INTRAVENOUS
  Filled 2023-07-01: qty 1

## 2023-07-01 MED ORDER — SODIUM CHLORIDE 0.9 % IV SOLN
500.0000 mg | Freq: Once | INTRAVENOUS | Status: AC
  Administered 2023-07-01: 500 mg via INTRAVENOUS
  Filled 2023-07-01: qty 5

## 2023-07-01 MED ORDER — METHYLPREDNISOLONE SODIUM SUCC 125 MG IJ SOLR
125.0000 mg | Freq: Once | INTRAMUSCULAR | Status: AC
  Administered 2023-07-01: 125 mg via INTRAVENOUS
  Filled 2023-07-01: qty 2

## 2023-07-01 NOTE — ED Provider Notes (Signed)
 [place] EMERGENCY DEPARTMENT AT [facility] Provider Note   CSN: [id number] Arrival date & time: 07/01/23  1757     Patient presents with: No chief complaint on file.   Michael [name] is a 63 y.o. male.   The history is provided by the patient and medical records. No language interpreter was used.  Shortness of Breath Severity:  Severe Onset quality:  Gradual Duration:  1 week Timing:  Constant Progression:  Worsening Context: URI   Relieved by:  Nothing Worsened by:  Coughing Ineffective treatments:  None tried Associated symptoms: cough, sputum production and wheezing   Associated symptoms: no abdominal pain, no chest pain, no fever, no headaches, no hemoptysis, no neck pain, no rash and no vomiting        Prior to Admission medications   Medication Sig Start Date End Date Taking? Authorizing Provider  albuterol  (ACCUNEB ) 1.25 MG/3ML nebulizer solution 2.5 mg every 4 hours as needed for cough, shortness of breath and/or wheezing 12/05/22   Darlean Ozell NOVAK, MD  aspirin EC 81 MG tablet Take 81 mg by mouth daily. Swallow whole.    [provider]  benzonatate  (TESSALON ) 200 MG capsule Take 1 capsule by mouth three times daily as needed 01/26/23   Darlean Ozell NOVAK, MD  desloratadine  (CLARINEX ) 5 MG tablet Take 1 tablet (5 mg total) by mouth daily. 08/29/22   Okey Burns, MD  EPINEPHrine  0.3 mg/0.3 mL IJ SOAJ injection Inject 0.3 mg into the muscle as needed for anaphylaxis. 10/26/21   Kennyth Worth HERO, MD  fluticasone  (FLONASE ) 50 MCG/ACT nasal spray Place 2 sprays into both nostrils daily. 08/29/22   Soldatova, Liuba, MD  mirtazapine  (REMERON ) 30 MG tablet Take 1 tablet (30 mg total) by mouth at bedtime. 04/07/23   Federico Rosario BROCKS, MD  nicotine  (NICODERM CQ  - DOSED IN MG/24 HR) 7 mg/24hr patch Place 1 patch (7 mg total) onto the skin daily. 06/02/23   Darlean Ozell NOVAK, MD  omeprazole  (PRILOSEC) 40 MG capsule Take 1 capsule (40 mg total) by mouth 2 (two) times  daily. 01/30/23   Federico Rosario BROCKS, MD  ondansetron  (ZOFRAN -ODT) 4 MG disintegrating tablet Take 1 tablet (4 mg total) by mouth every 8 (eight) hours as needed for nausea or vomiting. 01/11/23   Federico Rosario BROCKS, MD  predniSONE  (DELTASONE ) 10 MG tablet 2 until better then 1 daily 06/02/23   Wert, Michael B, MD  Tiotropium Bromide -Olodaterol (STIOLTO RESPIMAT ) 2.5-2.5 MCG/ACT AERS Inhale 2 puffs into the lungs daily. 12/05/22   Darlean Ozell NOVAK, MD  VENTOLIN  HFA 108 (90 Base) MCG/ACT inhaler INHALE UP TO  2 PUFFS BY MOUTH EVERY 4 HOURS AS NEEDED 12/05/22   Wert, Michael B, MD  Vitamin D , Ergocalciferol , (DRISDOL ) 1.25 MG (50000 UNIT) CAPS capsule Take 1 capsule (50,000 Units total) by mouth every Sunday. 10/03/21   Pearlean Manus, MD    Allergies: Shellfish allergy  and Amitriptyline    Review of Systems  Constitutional:  Positive for chills and fatigue. Negative for fever.  HENT:  Positive for congestion.   Respiratory:  Positive for cough, sputum production, chest tightness, shortness of breath and wheezing. Negative for hemoptysis and stridor.   Cardiovascular:  Negative for chest pain, palpitations and leg swelling.  Gastrointestinal:  Negative for abdominal pain, constipation, diarrhea, nausea and vomiting.  Genitourinary:  Negative for dysuria.  Musculoskeletal:  Negative for back pain and neck pain.  Skin:  Negative for rash and wound.  Neurological:  Negative for headaches.  Psychiatric/Behavioral:  Negative for agitation and confusion.   All other systems reviewed and are negative.   Updated Vital Signs BP 121/80   Pulse 84   Temp 98.1 F (36.7 C) (Oral)   Resp 14   Ht 5' 9 (1.753 m)   Wt 54.7 kg   SpO2 95%   BMI 17.81 kg/m   Physical Exam Vitals and nursing note reviewed.  Constitutional:      General: He is not in acute distress.    Appearance: He is well-developed. He is not ill-appearing, toxic-appearing or diaphoretic.  HENT:     Head: Normocephalic and atraumatic.      Nose: Congestion present. No rhinorrhea.     Mouth/Throat:     Mouth: Mucous membranes are moist.     Pharynx: No oropharyngeal exudate or posterior oropharyngeal erythema.   Eyes:     Extraocular Movements: Extraocular movements intact.     Conjunctiva/sclera: Conjunctivae normal.     Pupils: Pupils are equal, round, and reactive to light.    Cardiovascular:     Rate and Rhythm: Normal rate and regular rhythm.     Heart sounds: No murmur heard. Pulmonary:     Effort: Pulmonary effort is normal. No respiratory distress.     Breath sounds: Wheezing and rhonchi present. No rales.  Chest:     Chest wall: No tenderness.  Abdominal:     Palpations: Abdomen is soft.     Tenderness: There is no abdominal tenderness. There is no right CVA tenderness, left CVA tenderness, guarding or rebound.   Musculoskeletal:        General: No swelling or tenderness.     Cervical back: Neck supple. No tenderness.     Right lower leg: No edema.     Left lower leg: No edema.   Skin:    General: Skin is warm and dry.     Capillary Refill: Capillary refill takes less than 2 seconds.     Findings: No erythema or rash.   Neurological:     General: No focal deficit present.     Mental Status: He is alert.   Psychiatric:        Mood and Affect: Mood normal.     (all labs ordered are listed, but only abnormal results are displayed) Labs Reviewed  CBC - Abnormal; Notable for the following components:      Result Value   WBC 15.6 (*)    RBC 4.10 (*)    MCV 102.2 (*)    All other components within normal limits  I-STAT ARTERIAL BLOOD GAS, ED - Abnormal; Notable for the following components:   pH, Arterial 7.348 (*)    pCO2 arterial 54.5 (*)    pO2, Arterial 69 (*)    Bicarbonate 30.0 (*)    Acid-Base Excess 3.0 (*)    All other components within normal limits  RESP PANEL BY RT-PCR (RSV, FLU A&B, COVID)  RVPGX2  BASIC METABOLIC PANEL WITH GFR  PRO BRAIN NATRIURETIC PEPTIDE  TROPONIN T, HIGH  SENSITIVITY  TROPONIN T, HIGH SENSITIVITY    EKG: EKG Interpretation Date/Time:  Saturday July 01 2023 18:34:48 EDT Ventricular Rate:  76 PR Interval:  124 QRS Duration:  86 QT Interval:  360 QTC Calculation: 405 R Axis:   91  Text Interpretation: Normal sinus rhythm Right atrial enlargement Rightward axis Pulmonary disease pattern Abnormal ECG When compared with ECG of 11-May-2023 21:13, PREVIOUS ECG IS PRESENT when compared top rior, similar appearnce No STEMI  Confirmed by Ginger Barefoot ([id number]) on 07/01/2023 8:41:12 PM  Radiology: [facility] Chest Port 1 View Result Date: 07/01/2023 CLINICAL DATA:  10026 Shortness of breath 10026 EXAM: PORTABLE CHEST - 1 VIEW COMPARISON:  May 11, 2023 FINDINGS: Hyperexpanded lungs with architectural distortion in the lung apices, consistent with emphysema. Similar scarring and bronchiectasis in the left lung apex with overlying pleural thickening. No focal airspace consolidation, pleural effusion, or pneumothorax. No cardiomegaly. Left chest pacemaker with leads terminating in the right atrium and right ventricle. No acute fracture or destructive lesion. IMPRESSION: Emphysema.  No acute cardiopulmonary abnormality. Electronically Signed   By: Rogelia Myers M.D.   On: 07/01/2023 19:02     Procedures   CRITICAL CARE Performed by: Lonni PARAS Absalom Aro Total critical care time: 20 minutes Critical care time was exclusive of separately billable procedures and treating other patients. Critical care was necessary to treat or prevent imminent or life-threatening deterioration. Critical care was time spent personally by me on the following activities: development of treatment plan with patient and/or surrogate as well as nursing, discussions with consultants, evaluation of patient's response to treatment, examination of patient, obtaining history from patient or surrogate, ordering and performing treatments and interventions, ordering and review of laboratory  studies, ordering and review of radiographic studies, pulse oximetry and re-evaluation of patient's condition.   Medications Ordered in the ED  albuterol  (PROVENTIL ) (2.5 MG/3ML) 0.083% nebulizer solution 2.5 mg (2.5 mg Nebulization Given 07/01/23 2131)  methylPREDNISolone  sodium succinate (SOLU-MEDROL ) 125 mg/2 mL injection 125 mg (125 mg Intravenous Given 07/01/23 2151)  LORazepam (ATIVAN) injection 0.5 mg (0.5 mg Intravenous Given 07/01/23 2153)  magnesium sulfate IVPB 2 g 50 mL (0 g Intravenous Stopped 07/01/23 2343)  cefTRIAXone  (ROCEPHIN ) 1 g in sodium chloride  0.9 % 100 mL IVPB (0 g Intravenous Stopped 07/01/23 2238)  azithromycin  (ZITHROMAX ) 500 mg in sodium chloride  0.9 % 250 mL IVPB (0 mg Intravenous Stopped 07/01/23 2343)                                    Medical Decision Making Amount and/or Complexity of Data Reviewed Labs: ordered. Radiology: ordered.  Risk Prescription drug management. Decision regarding hospitalization.    BLU LORI is a 63 y.o. male with a past medical history significant for COPD, chronic hypercapnic respiratory failure, pacemaker, anxiety, depression, hypothyroidism, and seizures who presents with shortness of breath.  He reports he has had shortness of breath for the last week or 2 with more congestion and having a productive phlegm.  He reports he was admitted a while back for breathing difficulties and mops this but he has not had to mopped assist here today.  He reports he is wheezing and breathing treatment at home were not working.  He describes chest tightness but no focal pain.  He does not have significant edema in his legs but is having a difficult time breathing.  He feels very anxious.  On my exam, patient has accessory muscle use, he is breathing in the 30s, and has very wheezing and coarse breath sounds.  He is ill-appearing and does somewhat appear in respiratory distress.  He had nontender chest and abdomen and had intact pulses.  Legs are  not critically edematous.  Patient is uncomfortable.  EKG does not show STEMI.  Patient was given breathing treatment, magnesium, and steroids.  He does have a white count of 15.6 but chest x-ray did not  show convincing evidence of pneumonia.  Clinically I suspect he has either a viral infection versus early bacterial infection causing this COPD exacerbation.  Given his ill appearance and his transient hypoxia going 80s during my evaluation do not feel he is safe for discharge home.  Patient received treatments and will need admission.  Will give antibiotics as well.  Will call for admission for COPD exacerbation with hypoxia and increased work of breathing.  Patient has improved his respiratory status since getting treatments but he is still on oxygen  and will need admission.      Final diagnoses:  COPD exacerbation (HCC)  Shortness of breath  Hypoxia     Clinical Impression: 1. COPD exacerbation (HCC)   2. Shortness of breath   3. Hypoxia     Disposition: Admit  This note was prepared with assistance of Dragon voice recognition software. Occasional wrong-word or sound-a-like substitutions may have occurred due to the inherent limitations of voice recognition software.     Jewelianna Pancoast, Lonni PARAS, MD 07/02/23 0001

## 2023-07-01 NOTE — ED Notes (Signed)
 ED Provider at bedside.

## 2023-07-01 NOTE — ED Notes (Signed)
 X-Ray at bedside.

## 2023-07-01 NOTE — ED Triage Notes (Signed)
 Pt has COPD ,pacemaker, heart failure. He  has had some congestion for weeks, coughing up yellow thick phlegm. He is having shortness of breath as well.

## 2023-07-02 DIAGNOSIS — Z79899 Other long term (current) drug therapy: Secondary | ICD-10-CM | POA: Diagnosis not present

## 2023-07-02 DIAGNOSIS — R0602 Shortness of breath: Secondary | ICD-10-CM | POA: Diagnosis present

## 2023-07-02 DIAGNOSIS — I509 Heart failure, unspecified: Secondary | ICD-10-CM | POA: Diagnosis present

## 2023-07-02 DIAGNOSIS — J441 Chronic obstructive pulmonary disease with (acute) exacerbation: Secondary | ICD-10-CM | POA: Diagnosis present

## 2023-07-02 DIAGNOSIS — J208 Acute bronchitis due to other specified organisms: Secondary | ICD-10-CM | POA: Diagnosis not present

## 2023-07-02 DIAGNOSIS — J9601 Acute respiratory failure with hypoxia: Secondary | ICD-10-CM | POA: Diagnosis present

## 2023-07-02 DIAGNOSIS — Z1152 Encounter for screening for COVID-19: Secondary | ICD-10-CM | POA: Diagnosis not present

## 2023-07-02 DIAGNOSIS — B9689 Other specified bacterial agents as the cause of diseases classified elsewhere: Secondary | ICD-10-CM

## 2023-07-02 DIAGNOSIS — F418 Other specified anxiety disorders: Secondary | ICD-10-CM | POA: Diagnosis present

## 2023-07-02 DIAGNOSIS — G40909 Epilepsy, unspecified, not intractable, without status epilepticus: Secondary | ICD-10-CM | POA: Diagnosis present

## 2023-07-02 DIAGNOSIS — Z681 Body mass index (BMI) 19 or less, adult: Secondary | ICD-10-CM | POA: Diagnosis not present

## 2023-07-02 DIAGNOSIS — K219 Gastro-esophageal reflux disease without esophagitis: Secondary | ICD-10-CM | POA: Diagnosis present

## 2023-07-02 DIAGNOSIS — Z888 Allergy status to other drugs, medicaments and biological substances status: Secondary | ICD-10-CM | POA: Diagnosis not present

## 2023-07-02 DIAGNOSIS — Z7952 Long term (current) use of systemic steroids: Secondary | ICD-10-CM | POA: Diagnosis not present

## 2023-07-02 DIAGNOSIS — Z8249 Family history of ischemic heart disease and other diseases of the circulatory system: Secondary | ICD-10-CM | POA: Diagnosis not present

## 2023-07-02 DIAGNOSIS — Z825 Family history of asthma and other chronic lower respiratory diseases: Secondary | ICD-10-CM | POA: Diagnosis not present

## 2023-07-02 DIAGNOSIS — Z950 Presence of cardiac pacemaker: Secondary | ICD-10-CM | POA: Diagnosis not present

## 2023-07-02 DIAGNOSIS — E43 Unspecified severe protein-calorie malnutrition: Secondary | ICD-10-CM | POA: Diagnosis present

## 2023-07-02 DIAGNOSIS — Z91013 Allergy to seafood: Secondary | ICD-10-CM | POA: Diagnosis not present

## 2023-07-02 DIAGNOSIS — F17210 Nicotine dependence, cigarettes, uncomplicated: Secondary | ICD-10-CM | POA: Diagnosis present

## 2023-07-02 DIAGNOSIS — R64 Cachexia: Secondary | ICD-10-CM | POA: Diagnosis present

## 2023-07-02 DIAGNOSIS — I110 Hypertensive heart disease with heart failure: Secondary | ICD-10-CM | POA: Diagnosis present

## 2023-07-02 DIAGNOSIS — J439 Emphysema, unspecified: Secondary | ICD-10-CM | POA: Diagnosis present

## 2023-07-02 DIAGNOSIS — E039 Hypothyroidism, unspecified: Secondary | ICD-10-CM | POA: Diagnosis present

## 2023-07-02 DIAGNOSIS — T380X5A Adverse effect of glucocorticoids and synthetic analogues, initial encounter: Secondary | ICD-10-CM | POA: Diagnosis present

## 2023-07-02 DIAGNOSIS — R739 Hyperglycemia, unspecified: Secondary | ICD-10-CM | POA: Diagnosis present

## 2023-07-02 LAB — COMPREHENSIVE METABOLIC PANEL WITH GFR
ALT: 28 U/L (ref 0–44)
AST: 29 U/L (ref 15–41)
Albumin: 3.3 g/dL — ABNORMAL LOW (ref 3.5–5.0)
Alkaline Phosphatase: 99 U/L (ref 38–126)
Anion gap: 8 (ref 5–15)
BUN: 22 mg/dL (ref 8–23)
CO2: 29 mmol/L (ref 22–32)
Calcium: 8.2 mg/dL — ABNORMAL LOW (ref 8.9–10.3)
Chloride: 100 mmol/L (ref 98–111)
Creatinine, Ser: 0.83 mg/dL (ref 0.61–1.24)
GFR, Estimated: >60 mL/min (ref >60)
Glucose, Bld: 240 mg/dL — ABNORMAL HIGH (ref 70–99)
Potassium: 4.2 mmol/L (ref 3.5–5.1)
Sodium: 137 mmol/L (ref 135–145)
Total Bilirubin: 0.6 mg/dL (ref 0.0–1.2)
Total Protein: 6 g/dL — ABNORMAL LOW (ref 6.5–8.1)

## 2023-07-02 LAB — CBC WITH DIFFERENTIAL/PLATELET
Abs Immature Granulocytes: 0.06 10*3/uL (ref 0.00–0.07)
Basophils Absolute: 0 10*3/uL (ref 0.0–0.1)
Basophils Relative: 0 %
Eosinophils Absolute: 0 10*3/uL (ref 0.0–0.5)
Eosinophils Relative: 0 %
HCT: 42.1 % (ref 39.0–52.0)
Hemoglobin: 13.3 g/dL (ref 13.0–17.0)
Immature Granulocytes: 1 %
Lymphocytes Relative: 2 %
Lymphs Abs: 0.3 10*3/uL — ABNORMAL LOW (ref 0.7–4.0)
MCH: 33.9 pg (ref 26.0–34.0)
MCHC: 31.6 g/dL (ref 30.0–36.0)
MCV: 107.4 fL — ABNORMAL HIGH (ref 80.0–100.0)
Monocytes Absolute: 0.1 10*3/uL (ref 0.1–1.0)
Monocytes Relative: 1 %
Neutro Abs: 10.7 10*3/uL — ABNORMAL HIGH (ref 1.7–7.7)
Neutrophils Relative %: 96 %
Platelets: 179 10*3/uL (ref 150–400)
RBC: 3.92 MIL/uL — ABNORMAL LOW (ref 4.22–5.81)
RDW: 13.5 % (ref 11.5–15.5)
WBC: 11.1 10*3/uL — ABNORMAL HIGH (ref 4.0–10.5)
nRBC: 0 % (ref 0.0–0.2)

## 2023-07-02 LAB — HEMOGLOBIN A1C
Hgb A1c MFr Bld: 5.2 % (ref 4.8–5.6)
Mean Plasma Glucose: 102.54 mg/dL

## 2023-07-02 LAB — MAGNESIUM: Magnesium: 2.3 mg/dL (ref 1.7–2.4)

## 2023-07-02 MED ORDER — NICOTINE 21 MG/24HR TD PT24
21.0000 mg | MEDICATED_PATCH | Freq: Every day | TRANSDERMAL | Status: DC
  Administered 2023-07-02 – 2023-07-04 (×3): 21 mg via TRANSDERMAL
  Filled 2023-07-02 (×3): qty 1

## 2023-07-02 MED ORDER — MELATONIN 3 MG PO TABS: 3.0000 mg | ORAL_TABLET | Freq: Every evening | ORAL | Status: DC | PRN

## 2023-07-02 MED ORDER — MIRTAZAPINE 15 MG PO TABS
30.0000 mg | ORAL_TABLET | Freq: Every day | ORAL | Status: DC
  Administered 2023-07-02 – 2023-07-03 (×2): 30 mg via ORAL
  Filled 2023-07-02 (×2): qty 2

## 2023-07-02 MED ORDER — ENSURE MAX PROTEIN PO LIQD
11.0000 [oz_av] | Freq: Two times a day (BID) | ORAL | Status: DC
  Administered 2023-07-02 – 2023-07-03 (×3): 11 [oz_av] via ORAL
  Filled 2023-07-02 (×4): qty 330

## 2023-07-02 MED ORDER — SODIUM CHLORIDE 0.9 % IV SOLN
1.0000 g | INTRAVENOUS | Status: DC
  Administered 2023-07-02: 1 g via INTRAVENOUS
  Filled 2023-07-02: qty 10

## 2023-07-02 MED ORDER — ONDANSETRON HCL 4 MG/2ML IJ SOLN: 4.0000 mg | Freq: Four times a day (QID) | INTRAMUSCULAR | Status: DC | PRN

## 2023-07-02 MED ORDER — GUAIFENESIN 100 MG/5ML PO LIQD
5.0000 mL | Freq: Four times a day (QID) | ORAL | Status: DC
  Administered 2023-07-02 – 2023-07-04 (×8): 5 mL via ORAL
  Filled 2023-07-02 (×9): qty 10

## 2023-07-02 MED ORDER — ACETAMINOPHEN 325 MG PO TABS: 650.0000 mg | ORAL_TABLET | Freq: Four times a day (QID) | ORAL | Status: DC | PRN

## 2023-07-02 MED ORDER — ALBUTEROL SULFATE (2.5 MG/3ML) 0.083% IN NEBU: 2.5000 mg | INHALATION_SOLUTION | RESPIRATORY_TRACT | Status: DC | PRN

## 2023-07-02 MED ORDER — SODIUM CHLORIDE 0.9% FLUSH
3.0000 mL | Freq: Two times a day (BID) | INTRAVENOUS | Status: DC
  Administered 2023-07-02 – 2023-07-04 (×5): 3 mL via INTRAVENOUS

## 2023-07-02 MED ORDER — ENOXAPARIN SODIUM 40 MG/0.4ML IJ SOSY
40.0000 mg | PREFILLED_SYRINGE | INTRAMUSCULAR | Status: DC
  Administered 2023-07-02 – 2023-07-03 (×2): 40 mg via SUBCUTANEOUS
  Filled 2023-07-02 (×2): qty 0.4

## 2023-07-02 MED ORDER — PANTOPRAZOLE SODIUM 40 MG PO TBEC
40.0000 mg | DELAYED_RELEASE_TABLET | Freq: Every day | ORAL | Status: DC
  Administered 2023-07-02 – 2023-07-03 (×2): 40 mg via ORAL
  Filled 2023-07-02 (×2): qty 1

## 2023-07-02 MED ORDER — ASPIRIN 81 MG PO TBEC
81.0000 mg | DELAYED_RELEASE_TABLET | Freq: Every day | ORAL | Status: DC
  Administered 2023-07-02 – 2023-07-04 (×3): 81 mg via ORAL
  Filled 2023-07-02 (×3): qty 1

## 2023-07-02 MED ORDER — SODIUM CHLORIDE 0.9 % IV SOLN
500.0000 mg | INTRAVENOUS | Status: DC
  Administered 2023-07-02: 500 mg via INTRAVENOUS
  Filled 2023-07-02: qty 5

## 2023-07-02 MED ORDER — ACETAMINOPHEN 650 MG RE SUPP: 650.0000 mg | Freq: Four times a day (QID) | RECTAL | Status: DC | PRN

## 2023-07-02 MED ORDER — HYDROXYZINE HCL 25 MG PO TABS
25.0000 mg | ORAL_TABLET | Freq: Four times a day (QID) | ORAL | Status: DC | PRN
  Administered 2023-07-02 – 2023-07-03 (×2): 25 mg via ORAL
  Filled 2023-07-02 (×2): qty 1

## 2023-07-02 MED ORDER — BUDESONIDE 0.25 MG/2ML IN SUSP
0.2500 mg | Freq: Two times a day (BID) | RESPIRATORY_TRACT | Status: DC
  Administered 2023-07-02 – 2023-07-04 (×5): 0.25 mg via RESPIRATORY_TRACT
  Filled 2023-07-02 (×5): qty 2

## 2023-07-02 MED ORDER — IPRATROPIUM-ALBUTEROL 0.5-2.5 (3) MG/3ML IN SOLN
3.0000 mL | Freq: Four times a day (QID) | RESPIRATORY_TRACT | Status: DC
  Administered 2023-07-02 – 2023-07-04 (×8): 3 mL via RESPIRATORY_TRACT
  Filled 2023-07-02 (×8): qty 3

## 2023-07-02 MED ORDER — ALUM & MAG HYDROXIDE-SIMETH 200-200-20 MG/5ML PO SUSP
30.0000 mL | Freq: Once | ORAL | Status: AC
  Administered 2023-07-02: 30 mL via ORAL
  Filled 2023-07-02: qty 30

## 2023-07-02 MED ORDER — HYDROCODONE BIT-HOMATROP MBR 5-1.5 MG/5ML PO SOLN: 5.0000 mL | Freq: Four times a day (QID) | ORAL | Status: DC | PRN

## 2023-07-02 MED ORDER — METHYLPREDNISOLONE SODIUM SUCC 125 MG IJ SOLR
80.0000 mg | Freq: Two times a day (BID) | INTRAMUSCULAR | Status: DC
  Administered 2023-07-02 (×2): 80 mg via INTRAVENOUS
  Filled 2023-07-02 (×2): qty 2

## 2023-07-02 MED ORDER — LIDOCAINE VISCOUS HCL 2 % MT SOLN
15.0000 mL | Freq: Once | OROMUCOSAL | Status: DC
  Filled 2023-07-02: qty 15

## 2023-07-02 NOTE — Progress Notes (Signed)
 Mobility Specialist - Progress Note   07/02/23 1116  Oxygen  Therapy  SpO2 95 %  O2 Device Nasal Cannula  O2 Flow Rate (L/min) 1 L/min  Patient Activity (if Appropriate) Ambulating  Mobility  Activity Ambulated independently in hallway  Level of Assistance Independent  Assistive Device None  Distance Ambulated (ft) 500 ft  Activity Response Tolerated well  Mobility Referral Yes  Mobility visit 1 Mobility  Mobility Specialist Stop Time (ACUTE ONLY) 1115   Pt received in bed and agreeable to mobility. No complaints during session. Pt to bed after session with all needs met.    Pre-mobility: 97% SpO2 During mobility: 95% SpO2 Post-mobility: 96% SPO2  Chief Technology Officer

## 2023-07-02 NOTE — ED Notes (Signed)
 Awaiting [facility] for transport. Pt resting in room. RR equal and unlabored. No complaints at this time.

## 2023-07-02 NOTE — H&P (Signed)
 History and Physical    Patient: Michael [name] FMW:[id number] DOB: [birth year] DOA: 07/01/2023 DOS: the patient was seen and examined on 07/02/2023 PCP: Kennyth Worth HERO, MD  Patient coming from: Home-lives with his mother  Chief Complaint: No chief complaint on file.  HPI: Michael [name] is a 63 y.o. male with medical history significant of remote seizure disorder with patient stating no longer on AEDs, hypertension, anxiety depression, reported hypothyroidism currently not on medications, GERD and of course COPD with ongoing tobacco abuse.  Presented to [facility] reporting weeks of congestion and coughing up thick yellow phlegm as well as shortness of breath.  Denied sick contacts.  Denies issues with choking or coughing while eating.  Does not use oxygen  at home.  On initial presentation he was very tachypneic with audible wheezing and coarse breath sounds-of steroids, bronchodilators and oxygen  symptoms markedly improved.  At baseline patient does not utilize oxygen .  Chest x-ray without evidence of heart failure or pneumonia.  Hospitalist service was consulted to evaluate the patient for admission   Review of Systems: As mentioned in the history of present illness. All other systems reviewed and are negative.  In addition he has reported significant weight loss over the past 6 to 12 months.  States 1 year ago weight was 168 pounds with the majority of the weight reduction being over the past 6 months.  Patient reports current weight is 112 pounds.  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   GALLBLADDER SURGERY  2003   HAND SURGERY  1998   x3   OPEN REDUCTION INTERNAL FIXATION (ORIF) DISTAL RADIAL FRACTURE Right 09/07/2016   Procedure: RIGHT WRIST OPEN REDUCTION INTERNAL FIXATION (ORIF) REPAIR AS INDICATED;  Surgeon: Shari Easter, MD;  Location: [facility] OR;  Service: Orthopedics;  Laterality: Right;   PACEMAKER INSERTION  04/23/2003   [employer] Kappa; [facility] - symptomatic bradycardia   PERMANENT PACEMAKER GENERATOR CHANGE N/A 09/06/2012   Procedure: PERMANENT PACEMAKER GENERATOR CHANGE;  Surgeon: Jerel Balding, MD;  Location: [facility] CATH LAB;  Service: Cardiovascular;  Laterality: N/A;   TONSILLECTOMY     TRANSTHORACIC ECHOCARDIOGRAM  05/14/2012   EF 50-55%, normal systolic function; mild MR (ordered for mitral valve disease 424.0)   Social History:  reports that he has been smoking cigarettes. He has a 44 pack-year smoking history. He has never used smokeless tobacco. He reports that he does not drink alcohol and does not use drugs.  Allergies  Allergen Reactions   Shellfish Allergy  Anaphylaxis   Amitriptyline     'can't remember what happens'     Family History  Problem Relation Age of Onset   Heart Problems Mother        MVP   Heart disease Mother    CAD Father    COPD Father    Heart disease Father    Other Sister        crib dealth   Heart attack Maternal Grandmother    Hypertension Maternal Grandmother    Heart attack Paternal Grandmother    Hypertension Paternal Grandmother    Heart attack Paternal Grandfather    Hypertension Paternal  Grandfather    Heart Problems Other        aunts x 2 died of heart problems     Prior to Admission medications   Medication Sig Start Date End Date Taking? Authorizing Provider  albuterol  (ACCUNEB ) 1.25 MG/3ML nebulizer solution 2.5 mg every 4 hours as needed for cough, shortness of breath and/or wheezing 12/05/22   Darlean Ozell NOVAK, MD  aspirin EC 81 MG tablet Take 81 mg by mouth daily. Swallow whole.    [provider]  benzonatate  (TESSALON ) 200 MG capsule Take 1 capsule by mouth three times daily as needed 01/26/23   Darlean Ozell NOVAK, MD  desloratadine  (CLARINEX ) 5 MG tablet Take 1 tablet (5 mg total) by mouth daily. 08/29/22   Soldatova, Liuba, MD  EPINEPHrine  0.3 mg/0.3 mL IJ SOAJ injection Inject 0.3 mg into the muscle as needed for anaphylaxis. 10/26/21   Kennyth Worth HERO, MD  fluticasone  (FLONASE ) 50 MCG/ACT nasal spray Place 2 sprays into both nostrils daily. 08/29/22   Soldatova, Liuba, MD  mirtazapine  (REMERON ) 30 MG tablet Take 1 tablet (30 mg total) by mouth at bedtime. 04/07/23   Federico Rosario BROCKS, MD  nicotine  (NICODERM CQ  - DOSED IN MG/24 HR) 7 mg/24hr patch Place 1 patch (7 mg total) onto the skin daily. 06/02/23   Darlean Ozell NOVAK, MD  omeprazole  (PRILOSEC) 40 MG capsule Take 1 capsule (40 mg total) by mouth 2 (two) times daily. 01/30/23   Federico Rosario BROCKS, MD  ondansetron  (ZOFRAN -ODT) 4 MG disintegrating tablet Take 1 tablet (4 mg total) by mouth every 8 (eight) hours as needed for nausea or vomiting. 01/11/23   Federico Rosario BROCKS, MD  predniSONE  (DELTASONE ) 10 MG tablet 2 until better then 1 daily 06/02/23   Wert, Michael B, MD  Tiotropium Bromide -Olodaterol (STIOLTO RESPIMAT ) 2.5-2.5 MCG/ACT AERS Inhale 2 puffs into the lungs daily. 12/05/22   Darlean Ozell NOVAK, MD  VENTOLIN  HFA 108 (90 Base) MCG/ACT inhaler INHALE UP TO  2 PUFFS BY MOUTH EVERY 4 HOURS AS NEEDED 12/05/22   Darlean Ozell NOVAK, MD  Vitamin D , Ergocalciferol , (DRISDOL ) 1.25 MG (50000 UNIT) CAPS capsule Take 1 capsule (50,000 Units  total) by mouth every Sunday. 10/03/21   Pearlean Manus, MD    Physical Exam: Vitals:   07/02/23 0300 07/02/23 0346 07/02/23 0732 07/02/23 0736  BP: 109/66 134/81    Pulse: 84 77    Resp: 15 20    Temp:  98.2 F (36.8 C)    TempSrc:  Oral    SpO2: 98% 93% 97% 97%  Weight:  52.2 kg    Height:  5' 9 (1.753 m)     Constitutional: NAD, calm, comfortable but appears chronically ill and cachectic Respiratory: Diffuse fine inspiratory and expiratory wheezes with a few scattered crackles.  Normal respiratory effort. No accessory  muscle use.  Oxygen  at 1 to 2 L/min. Cardiovascular: Regular rate and rhythm, no murmurs / rubs / gallops. No extremity edema. 2+ pedal pulses.  Abdomen: no tenderness, no masses palpated. No hepatosplenomegaly. Bowel sounds positive.  Musculoskeletal: no clubbing / cyanosis. No joint deformity upper and lower extremities. Good ROM, no contractures. Normal muscle tone.  Skin: no rashes, lesions, ulcers. No induration Neurologic: CN 2-12 grossly intact. Sensation intact,  Strength 5/5 x all 4 extremities.  Psychiatric: Normal judgment and insight. Alert and oriented x 3. Normal mood.     Data Reviewed:  ABG: pH 7.34, pCO2 54.5, pO2 69, bicarb 30, ABE 3  Sodium 137, potassium 4.2, glucose 240, BUN 22, creatinine 0.83  Albumin 3.3, total protein 6  WBC 15,600, hemoglobin 13.9, platelets 195,000  Assessment and Plan: Acute exacerbation of GOLD COPD 3 with bronchitis Subcentimeter RLL pulmonary nodules Ongoing tobacco abuse Continue supportive care with oxygen  therapy, incentive spirometry and early mobilization Appears to have chronic hypoxemia and hypercarbia that is compensated based on ABG Have initiated IV steroids, scheduled DuoNebs along with as needed albuterol  nebs and budesonide  nebs Followed by Dr. Darlean in the outpatient setting.  Home meds include Respimat, Symbicort  and albuterol  MDI.  Patient also states he has nebulizers that he can use but is  reluctant to do so. Having bronchitis-like symptoms with frequent coughing and productive yellow sputum therefore have initiated Rocephin  and Zithromax  IV Add mucolytic guaifenesin  Subcentimeter pulmonary nodules noted on outpatient CT scan in May Continues to smoke 1 to 2 packs of cigarettes per day despite counseling from his primary pulmonologist-I have discussed with him that as long as he smokes he will continue to have damage to his lungs that will be irreversible.  Acute respiratory failure with hypoxemia Not on oxygen  at home and reluctant to initiate in the outpatient setting.  I explained to him that his difficulty eating secondary to shortness of breath may be secondary to need for oxygen .  I have recommended to him that we check ambulatory sats on room air prior to discharge.  Hyperglycemia Likely secondary to steroids although glucoses in the mid 200 Check hemoglobin A1c  Hypertension Not on medications at home  Pulmonary cachexia/protein calorie malnutrition severe Possible food insecurity Patient reports lack of interest in food as well as some issues related to procurement of food as well as difficulty breathing while eating He also states on his disability he has less than $20 per month to pay for food/grocery Nuclear med gastric emptying study from January shows no delayed gastric emptying Continue PPI BMI 16.9-I have consulted nutrition for recommendations-I have also added Ensure protein supplement Patient reports weight loss from 168 pounds to 112 pounds over the past year  Anxiety and depression Continue Remeron  as ordered in the outpatient setting  Permanent pacemaker Last pacemaker check June 20, 2023     Advance Care Planning:   Code Status: Full Code   VTE prophylaxis: Lovenox   Consults: None  Family Communication: Patient only  Severity of Illness: The appropriate patient status for this patient is INPATIENT. Inpatient status is judged to be  reasonable and necessary in order to provide the required intensity of service to ensure the patient's safety. The patient's presenting symptoms, physical exam findings, and initial radiographic and laboratory data in the context of their chronic comorbidities is felt to place them at high risk for further clinical deterioration. Furthermore, it is not anticipated that the patient will be medically stable for discharge from the  hospital within 2 midnights of admission.   * I certify that at the point of admission it is my clinical judgment that the patient will require inpatient hospital care spanning beyond 2 midnights from the point of admission due to high intensity of service, high risk for further deterioration and high frequency of surveillance required.*  Author: Isaiah Lever, NP 07/02/2023 7:41 AM  For on call review [email or web address].

## 2023-07-02 NOTE — Progress Notes (Signed)
(  Carryover admission to the Day Admitter; accepted by Dr.  Shona as transfer from  [facility]  to a  med-tele bed at  [facility]  for  acute copd exacerbation complicated by acute hypoxic respiratory failure. Please see Dr.  Milford transfer documentation in [facility] Communication for additional details).  I have placed some additional preliminary admit orders via the adult multi-morbid admission order set. I have also ordered Solu-Medrol , scheduled duo nebulizer treatments, as needed albuterol  nebulizers and also ordered morning labs to include CMP, CBC, magnesium level.    Eva Pore, DO Hospitalist

## 2023-07-02 NOTE — Plan of Care (Signed)
   Problem: Education: Goal: Knowledge of General Education information will improve Description: Including pain rating scale, medication(s)/side effects and non-pharmacologic comfort measures Outcome: Progressing   Problem: Health Behavior/Discharge Planning: Goal: Ability to manage health-related needs will improve Outcome: Progressing   Problem: Clinical Measurements: Goal: Ability to maintain clinical measurements within normal limits will improve Outcome: Progressing Goal: Diagnostic test results will improve Outcome: Progressing Goal: Respiratory complications will improve Outcome: Progressing   Problem: Activity: Goal: Risk for activity intolerance will decrease Outcome: Progressing   Problem: Nutrition: Goal: Adequate nutrition will be maintained Outcome: Progressing   Problem: Coping: Goal: Level of anxiety will decrease Outcome: Progressing

## 2023-07-02 NOTE — Plan of Care (Signed)
  Problem: Education: Goal: Knowledge of General Education information will improve Description: Including pain rating scale, medication(s)/side effects and non-pharmacologic comfort measures Outcome: Progressing   Problem: Clinical Measurements: Goal: Ability to maintain clinical measurements within normal limits will improve Outcome: Progressing Goal: Will remain free from infection Outcome: Progressing Goal: Diagnostic test results will improve Outcome: Progressing Goal: Respiratory complications will improve Outcome: Progressing   Problem: Nutrition: Goal: Adequate nutrition will be maintained Outcome: Progressing   Problem: Coping: Goal: Level of anxiety will decrease Outcome: Progressing   Problem: Elimination: Goal: Will not experience complications related to bowel motility Outcome: Progressing Goal: Will not experience complications related to urinary retention Outcome: Progressing   Problem: Safety: Goal: Ability to remain free from injury will improve Outcome: Progressing   Problem: Skin Integrity: Goal: Risk for impaired skin integrity will decrease Outcome: Progressing

## 2023-07-03 DIAGNOSIS — J441 Chronic obstructive pulmonary disease with (acute) exacerbation: Secondary | ICD-10-CM | POA: Diagnosis not present

## 2023-07-03 DIAGNOSIS — E43 Unspecified severe protein-calorie malnutrition: Secondary | ICD-10-CM | POA: Insufficient documentation

## 2023-07-03 LAB — COMPREHENSIVE METABOLIC PANEL WITH GFR
ALT: 29 U/L (ref 0–44)
AST: 22 U/L (ref 15–41)
Albumin: 3.3 g/dL — ABNORMAL LOW (ref 3.5–5.0)
Alkaline Phosphatase: 93 U/L (ref 38–126)
Anion gap: 7 (ref 5–15)
BUN: 31 mg/dL — ABNORMAL HIGH (ref 8–23)
CO2: 29 mmol/L (ref 22–32)
Calcium: 8.5 mg/dL — ABNORMAL LOW (ref 8.9–10.3)
Chloride: 101 mmol/L (ref 98–111)
Creatinine, Ser: 0.76 mg/dL (ref 0.61–1.24)
GFR, Estimated: >60 mL/min (ref >60)
Glucose, Bld: 140 mg/dL — ABNORMAL HIGH (ref 70–99)
Potassium: 4.2 mmol/L (ref 3.5–5.1)
Sodium: 137 mmol/L (ref 135–145)
Total Bilirubin: 0.6 mg/dL (ref 0.0–1.2)
Total Protein: 5.9 g/dL — ABNORMAL LOW (ref 6.5–8.1)

## 2023-07-03 LAB — CBC
HCT: 40.7 % (ref 39.0–52.0)
Hemoglobin: 13.4 g/dL (ref 13.0–17.0)
MCH: 34.4 pg — ABNORMAL HIGH (ref 26.0–34.0)
MCHC: 32.9 g/dL (ref 30.0–36.0)
MCV: 104.6 fL — ABNORMAL HIGH (ref 80.0–100.0)
Platelets: 196 10*3/uL (ref 150–400)
RBC: 3.89 MIL/uL — ABNORMAL LOW (ref 4.22–5.81)
RDW: 13.2 % (ref 11.5–15.5)
WBC: 14.1 10*3/uL — ABNORMAL HIGH (ref 4.0–10.5)
nRBC: 0 % (ref 0.0–0.2)

## 2023-07-03 MED ORDER — BOOST PLUS PO LIQD
237.0000 mL | Freq: Three times a day (TID) | ORAL | Status: DC
  Administered 2023-07-03 – 2023-07-04 (×2): 237 mL via ORAL
  Filled 2023-07-03 (×3): qty 237

## 2023-07-03 MED ORDER — ALUM & MAG HYDROXIDE-SIMETH 200-200-20 MG/5ML PO SUSP: 30.0000 mL | Freq: Four times a day (QID) | ORAL | Status: DC | PRN

## 2023-07-03 MED ORDER — PREDNISONE 50 MG PO TABS
50.0000 mg | ORAL_TABLET | Freq: Every day | ORAL | Status: DC
  Administered 2023-07-03 – 2023-07-04 (×2): 50 mg via ORAL
  Filled 2023-07-03 (×2): qty 1

## 2023-07-03 MED ORDER — PANTOPRAZOLE SODIUM 40 MG PO TBEC
40.0000 mg | DELAYED_RELEASE_TABLET | Freq: Two times a day (BID) | ORAL | Status: DC
  Administered 2023-07-03 – 2023-07-04 (×2): 40 mg via ORAL
  Filled 2023-07-03 (×2): qty 1

## 2023-07-03 MED ORDER — AZITHROMYCIN 250 MG PO TABS
500.0000 mg | ORAL_TABLET | Freq: Every day | ORAL | Status: DC
  Administered 2023-07-03: 500 mg via ORAL
  Filled 2023-07-03: qty 2

## 2023-07-03 NOTE — Plan of Care (Signed)
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
## 2023-07-03 NOTE — Progress Notes (Signed)
 PROGRESS NOTE  Michael [name] FMW:[id number] DOB: [birth year] DOA: 07/01/2023 PCP: Kennyth Worth HERO, MD   LOS: 1 day   Brief narrative:   Michael [name] is a 63 y.o. male with medical history significant of remote seizure disorder not on AEDs, hypertension, anxiety depression, reported hypothyroidism not on medications, GERD and COPD with ongoing tobacco abuse presented to hospital with cough congestion and shortness of breath..  In the ED patient was tachypneic with wheezing and coarse breath sounds.  Tachypneic with audible wheezing and coarse breath sounds.  Chest x-ray without any infiltrate.  Patient presented to hospital for further evaluation and treatment     Assessment/Plan: Active Problems:   COPD with acute exacerbation (HCC)   Protein-calorie malnutrition, severe  Acute exacerbation of GOLD COPD 3 with bronchitis Subcentimeter RLL pulmonary nodules Ongoing tobacco abuse  Continue oxygen  therapy, incentive spirometry.  Continue IV steroids nebulizers.  Patient follows up with Dr. Darlean pulmonary as outpatient.  Continue Zithromax  empirically.  Chest x-ray without infiltrate.  Likely has some bronchitis.  Mucolytic's. Subcentimeter pulmonary nodules noted on outpatient CT scan in May. Continues to smoke 1 to 2 packs of cigarettes per day despite counseling from his primary pulmonologist   Acute respiratory failure with hypoxemia Not on oxygen  at home and reluctant to initiate in the outpatient setting.  Will need to check for oxygen  requirement prior to discharge.  Hyperglycemia Secondary steroids.  Hemoglobin A1c of 5.2.   Hypertension Not on medications at home   Pulmonary cachexia/protein calorie malnutrition severe Possible food insecurity Patient reports lack of interest in food as well as some issues related to procurement of food. Nuclear med gastric emptying study from January shows no delayed gastric emptying Continue Ensure supplement.  Continue PPI   Anxiety and  depression Continue Remeron     Permanent pacemaker Last pacemaker check June 20, 2023  DVT prophylaxis: enoxaparin  (LOVENOX ) injection 40 mg Start: 07/02/23 2200   Disposition: Likely home in 1 to 2 days.  Patient lives with his mother at home  Status is: Inpatient Remains inpatient appropriate because: COPD exacerbation, IV steroids, pending clinical improvement    Code Status:     Code Status: Full Code  Family Communication: Spoke with the patient's mother at bedside  Consultants: None  Procedures: None  Anti-infectives:  Zithromax   Anti-infectives (From admission, onward)    Start     Dose/Rate Route Frequency Ordered Stop   07/03/23 2200  azithromycin  (ZITHROMAX ) tablet 500 mg        500 mg Oral Daily at bedtime 07/03/23 0845     07/02/23 2200  azithromycin  (ZITHROMAX ) 500 mg in sodium chloride  0.9 % 250 mL IVPB  Status:  Discontinued        500 mg 250 mL/hr over 60 Minutes Intravenous Every 24 hours 07/02/23 0721 07/03/23 0845   07/02/23 2000  cefTRIAXone  (ROCEPHIN ) 1 g in sodium chloride  0.9 % 100 mL IVPB  Status:  Discontinued        1 g 200 mL/hr over 30 Minutes Intravenous Every 24 hours 07/02/23 0721 07/03/23 0345   07/01/23 2145  cefTRIAXone  (ROCEPHIN ) 1 g in sodium chloride  0.9 % 100 mL IVPB        1 g 200 mL/hr over 30 Minutes Intravenous  Once 07/01/23 2138 07/01/23 2238   07/01/23 2145  azithromycin  (ZITHROMAX ) 500 mg in sodium chloride  0.9 % 250 mL IVPB        500 mg 250 mL/hr over 60 Minutes Intravenous  Once  07/01/23 2138 07/01/23 2343        Subjective: Today, patient was seen and examined at bedside.  States that he has some improvement in shortness of breath but still has dyspnea on exertion.  Patient's brother at bedside.  Has been having some cough.  Mother states that has not been eating well.  Objective: Vitals:   07/03/23 1223 07/03/23 1443  BP: 120/77   Pulse: 90   Resp: 15   Temp: 98.4 F (36.9 C)   SpO2: 96% 95%     Intake/Output Summary (Last 24 hours) at 07/03/2023 1450 Last data filed at 07/03/2023 1039 Gross per 24 hour  Intake 468 ml  Output 400 ml  Net 68 ml   Filed Weights   07/01/23 1829 07/02/23 0346 07/03/23 0546  Weight: 54.7 kg 52.2 kg 55.7 kg   Body mass index is 18.13 kg/m.   Physical Exam: GENERAL: Patient is alert awake and oriented. Not in obvious distress.  Thinly built, appears chronically ill and deconditioned.  Cachectic. HENT: No scleral pallor or icterus. Pupils equally reactive to light. Oral mucosa is moist NECK: is supple, no gross swelling noted. CHEST:   Diminished breath sounds bilaterally.  Coarse breath sounds noted. CVS: S1 and S2 heard, no murmur. Regular rate and rhythm.  ABDOMEN: Soft, non-tender, bowel sounds are present. EXTREMITIES: No edema. CNS: Cranial nerves are intact. No focal motor deficits. SKIN: warm and dry without rashes.  Data Review: I have personally reviewed the following laboratory data and studies,  CBC: Recent Labs  Lab 07/01/23 1950 07/01/23 2150 07/02/23 0617 07/03/23 0633  WBC 15.6*  --  11.1* 14.1*  NEUTROABS  --   --  10.7*  --   HGB 13.9 14.6 13.3 13.4  HCT 41.9 43.0 42.1 40.7  MCV 102.2*  --  107.4* 104.6*  PLT 195  --  179 196   Basic Metabolic Panel: Recent Labs  Lab 07/01/23 1950 07/01/23 2150 07/02/23 0617 07/03/23 0633  NA 141 140 137 137  K 3.8 3.6 4.2 4.2  CL 103  --  100 101  CO2 30  --  29 29  GLUCOSE 90  --  240* 140*  BUN 22  --  22 31*  CREATININE 0.85  --  0.83 0.76  CALCIUM 8.9  --  8.2* 8.5*  MG  --   --  2.3  --    Liver Function Tests: Recent Labs  Lab 07/02/23 0617 07/03/23 0633  AST 29 22  ALT 28 29  ALKPHOS 99 93  BILITOT 0.6 0.6  PROT 6.0* 5.9*  ALBUMIN 3.3* 3.3*   No results for input(s): LIPASE, AMYLASE in the last 168 hours. No results for input(s): AMMONIA in the last 168 hours. Cardiac Enzymes: No results for input(s): CKTOTAL, CKMB, CKMBINDEX,  TROPONINI in the last 168 hours. BNP (last 3 results) No results for input(s): BNP in the last 8760 hours.  ProBNP (last 3 results) Recent Labs    07/01/23 2220  PROBNP 128.0    CBG: No results for input(s): GLUCAP in the last 168 hours. Recent Results (from the past 240 hours)  Resp panel by RT-PCR (RSV, Flu A&B, Covid) Anterior Nasal Swab     Status: None   Collection Time: 07/01/23  7:59 PM   Specimen: Anterior Nasal Swab  Result Value Ref Range Status   SARS Coronavirus 2 by RT PCR NEGATIVE NEGATIVE Final    Comment: (NOTE) SARS-CoV-2 target nucleic acids are NOT DETECTED.  The  SARS-CoV-2 RNA is generally detectable in upper respiratory specimens during the acute phase of infection. The lowest concentration of SARS-CoV-2 viral copies this assay can detect is 138 copies/mL. A negative result does not preclude SARS-Cov-2 infection and should not be used as the sole basis for treatment or other patient management decisions. A negative result may occur with  improper specimen collection/handling, submission of specimen other than nasopharyngeal swab, presence of viral mutation(s) within the areas targeted by this assay, and inadequate number of viral copies(<138 copies/mL). A negative result must be combined with clinical observations, patient history, and epidemiological information. The expected result is Negative.  Fact Sheet for Patients:  BloggerCourse.com  Fact Sheet for Healthcare Providers:  SeriousBroker.it  This test is no t yet approved or cleared by the [place]  FDA and  has been authorized for detection and/or diagnosis of SARS-CoV-2 by FDA under an Emergency Use Authorization (EUA). This EUA will remain  in effect (meaning this test can be used) for the duration of the COVID-19 declaration under Section 564(b)(1) of the Act, 21 U.S.C.section 360bbb-3(b)(1), unless the authorization is terminated   or revoked sooner.       Influenza A by PCR NEGATIVE NEGATIVE Final   Influenza B by PCR NEGATIVE NEGATIVE Final    Comment: (NOTE) The Xpert Xpress SARS-CoV-2/FLU/RSV plus assay is intended as an aid in the diagnosis of influenza from Nasopharyngeal swab specimens and should not be used as a sole basis for treatment. Nasal washings and aspirates are unacceptable for Xpert Xpress SARS-CoV-2/FLU/RSV testing.  Fact Sheet for Patients: BloggerCourse.com  Fact Sheet for Healthcare Providers: SeriousBroker.it  This test is not yet approved or cleared by the [place]  FDA and has been authorized for detection and/or diagnosis of SARS-CoV-2 by FDA under an Emergency Use Authorization (EUA). This EUA will remain in effect (meaning this test can be used) for the duration of the COVID-19 declaration under Section 564(b)(1) of the Act, 21 U.S.C. section 360bbb-3(b)(1), unless the authorization is terminated or revoked.     Resp Syncytial Virus by PCR NEGATIVE NEGATIVE Final    Comment: (NOTE) Fact Sheet for Patients: BloggerCourse.com  Fact Sheet for Healthcare Providers: SeriousBroker.it  This test is not yet approved or cleared by the [place]  FDA and has been authorized for detection and/or diagnosis of SARS-CoV-2 by FDA under an Emergency Use Authorization (EUA). This EUA will remain in effect (meaning this test can be used) for the duration of the COVID-19 declaration under Section 564(b)(1) of the Act, 21 U.S.C. section 360bbb-3(b)(1), unless the authorization is terminated or revoked.  Performed at [facility], [address] [place], [place] [address]      Studies: DG Chest [facility] 1 View Result Date: 07/01/2023 CLINICAL DATA:  10026 Shortness of breath 10026 EXAM: PORTABLE CHEST - 1 VIEW COMPARISON:  May 11, 2023 FINDINGS: Hyperexpanded lungs  with architectural distortion in the lung apices, consistent with emphysema. Similar scarring and bronchiectasis in the left lung apex with overlying pleural thickening. No focal airspace consolidation, pleural effusion, or pneumothorax. No cardiomegaly. Left chest pacemaker with leads terminating in the right atrium and right ventricle. No acute fracture or destructive lesion. IMPRESSION: Emphysema.  No acute cardiopulmonary abnormality. Electronically Signed   By: Rogelia Myers M.D.   On: 07/01/2023 19:02      Vernal Alstrom, MD  [facility] 07/03/2023  If 7PM-7AM, please contact night-coverage

## 2023-07-03 NOTE — Plan of Care (Signed)
  Problem: Clinical Measurements: Goal: Respiratory complications will improve Outcome: Progressing   Problem: Nutrition: Goal: Adequate nutrition will be maintained Outcome: Progressing   Problem: Safety: Goal: Ability to remain free from injury will improve Outcome: Progressing   

## 2023-07-03 NOTE — Discharge Instructions (Signed)

## 2023-07-03 NOTE — Hospital Course (Signed)
 Michael [name] is a 63 y.o. male with medical history significant of remote seizure disorder not on AEDs, hypertension, anxiety depression, reported hypothyroidism not on medications, GERD and COPD with ongoing tobacco abuse presented to hospital with cough congestion and shortness of breath..  In the ED patient was tachypneic with wheezing and coarse breath sounds.  Tachypneic with audible wheezing and coarse breath sounds.  Chest x-ray without any infiltrate.  Acute exacerbation of GOLD COPD 3 with bronchitis Subcentimeter RLL pulmonary nodules Ongoing tobacco abuse  Continue oxygen  therapy, incentive spirometry.  Continue IV steroids nebulizers.  Patient follows up with Dr. Darlean pulmonary as outpatient.  Continue Rocephin  and Zithromax .  Mucolytic's.Subcentimeter pulmonary nodules noted on outpatient CT scan in May Continues to smoke 1 to 2 packs of cigarettes per day despite counseling from his primary pulmonologist   Acute respiratory failure with hypoxemia Not on oxygen  at home and reluctant to initiate in the outpatient setting.  Will need to check for oxygen  requirement prior to discharge.  Hyperglycemia Secondary steroids.  Hemoglobin A1c of 5.2.   Hypertension Not on medications at home   Pulmonary cachexia/protein calorie malnutrition severe Possible food insecurity Patient reports lack of interest in food as well as some issues related to procurement of food. Nuclear med gastric emptying study from January shows no delayed gastric emptying Continue Ensure supplement.  Continue PPI   Anxiety and depression Continue Remeron     Permanent pacemaker Last pacemaker check June 20, 2023

## 2023-07-04 DIAGNOSIS — J441 Chronic obstructive pulmonary disease with (acute) exacerbation: Secondary | ICD-10-CM | POA: Diagnosis not present

## 2023-07-04 MED ORDER — GUAIFENESIN 100 MG/5ML PO LIQD: 5.0000 mL | Freq: Four times a day (QID) | ORAL | 0 refills | Status: AC

## 2023-07-04 MED ORDER — VENTOLIN HFA 108 (90 BASE) MCG/ACT IN AERS: INHALATION_SPRAY | RESPIRATORY_TRACT | 3 refills | Status: AC

## 2023-07-04 MED ORDER — PREDNISONE 50 MG PO TABS
50.0000 mg | ORAL_TABLET | Freq: Every day | ORAL | 0 refills | Status: AC
Start: 2023-07-04 — End: 2023-07-07

## 2023-07-04 MED ORDER — MELATONIN 3 MG PO TABS: 3.0000 mg | ORAL_TABLET | Freq: Every evening | ORAL | 0 refills | Status: AC | PRN

## 2023-07-04 MED ORDER — BOOST PLUS PO LIQD: 237.0000 mL | Freq: Three times a day (TID) | ORAL | Status: AC

## 2023-07-04 MED ORDER — PREDNISONE 10 MG PO TABS: 10.0000 mg | ORAL_TABLET | Freq: Every evening | ORAL | 2 refills | Status: DC

## 2023-07-04 MED ORDER — IPRATROPIUM-ALBUTEROL 0.5-2.5 (3) MG/3ML IN SOLN: 3.0000 mL | Freq: Two times a day (BID) | RESPIRATORY_TRACT | Status: DC

## 2023-07-04 MED ORDER — HYDROXYZINE HCL 25 MG PO TABS: 25.0000 mg | ORAL_TABLET | Freq: Four times a day (QID) | ORAL | 2 refills | Status: AC | PRN

## 2023-07-04 MED ORDER — AZITHROMYCIN 250 MG PO TABS: 250.0000 mg | ORAL_TABLET | Freq: Every day | ORAL | 0 refills | Status: AC

## 2023-07-04 MED ORDER — NICOTINE 7 MG/24HR TD PT24
7.0000 mg | MEDICATED_PATCH | Freq: Every day | TRANSDERMAL | 1 refills | Status: DC
Start: 2023-07-04 — End: 2023-10-04

## 2023-07-04 MED ORDER — STIOLTO RESPIMAT 2.5-2.5 MCG/ACT IN AERS: 2.0000 | INHALATION_SPRAY | Freq: Every day | RESPIRATORY_TRACT | 3 refills | Status: AC

## 2023-07-04 NOTE — Discharge Summary (Addendum)
 Physician Discharge Summary  Michael [name] FMW:[id number] DOB: [birth year] DOA: 07/01/2023  PCP: Kennyth Worth HERO, MD  Admit date: 07/01/2023 Discharge date: 07/04/2023  Admitted From: Home  Discharge disposition: Home   Recommendations for Outpatient Follow-Up:   Follow up with your primary care provider in one week.  Check CBC, BMP, magnesium in the next visit Follow-up with pulmonary Dr. Darlean as outpatient   Discharge Diagnosis:   Active Problems:   COPD with acute exacerbation (HCC)   Protein-calorie malnutrition, severe    Discharge Condition: Improved.  Diet recommendation: Regular.  Wound care: None.  Code status: Full.   History of Present Illness:    Michael [name] is a 63 y.o. male with medical history significant of remote seizure disorder not on AEDs, hypertension, anxiety depression, reported hypothyroidism not on medications, GERD and COPD with ongoing tobacco abuse presented to hospital with cough congestion and shortness of breath..  In the ED, patient was tachypneic with wheezing and coarse breath sounds.  Tachypneic with audible wheezing and coarse breath sounds.  Chest x-ray without any infiltrate.  Patient presented to hospital for further evaluation and treatment    Hospital Course:   Following conditions were addressed during hospitalization as listed below,  Acute exacerbation of GOLD COPD 3 with bronchitis Subcentimeter RLL pulmonary nodules Ongoing tobacco abuse   Has significantly improved at this time.  Initially required supplemental oxygen .  Currently on room air.  Received IV steroids nebulizers.  Patient follows up with Dr. Darlean pulmonary as outpatient.  On prednisone  20 mg at home.  Will continue prednisone  50 for next 3 days followed by his home dose of prednisone  on discharge.  Continue Zithromax  empirically to complete the course..  Chest x-ray without infiltrate.  Likely has some bronchitis.  Continue antitussives.  Subcentimeter pulmonary  nodules noted on outpatient CT scan in May. Continues to smoke 1 to 2 packs of cigarettes per day despite counseling from his primary pulmonologist.  He is motivated to quit.  Will continue nicotine  patch while on discharge.  Extensive counseling was done.   Acute respiratory failure with hypoxemia Resolved at this time.  Not on oxygen  at home and reluctant to initiate in the outpatient setting.  At this time patient is on room air.  Was able to ambulate.    Hyperglycemia Secondary steroids.  Hemoglobin A1c of 5.2.   Hypertension, as per records Not on medications at home.  Latest blood pressure 121/76.   Pulmonary cachexia/protein calorie malnutrition severeBody mass index is 18.36 kg/m. Significant muscle mass loss with weight loss.   Possible food insecurity Patient reports lack of interest in food as well as some issues related to procurement of food. Nuclear med gastric emptying study from January shows no delayed gastric emptying   Anxiety and depression Continue Remeron     Permanent pacemaker Last pacemaker check June 20, 2023  Disposition.  At this time, patient is stable for disposition home with outpatient PCP and pulmonary follow-up.  Medical Consultants:   None.  Procedures:    None Subjective:   Today, patient was seen and examined at bedside.  Complains of mild cough but otherwise significantly improved.  Off supplemental oxygen .  He wants to go home.  Discharge Exam:   Vitals:   07/04/23 0522 07/04/23 0755  BP: 121/76   Pulse: 71   Resp: 16   Temp: (!) 97.3 F (36.3 C)   SpO2: 97% 97%   Vitals:   07/04/23 0225 07/04/23 0500 07/04/23 0522  07/04/23 0755  BP:   121/76   Pulse:   71   Resp:   16   Temp:   (!) 97.3 F (36.3 C)   TempSrc:   Oral   SpO2: 98%  97% 97%  Weight:  56.4 kg    Height:        General: Alert awake, not in obvious distress, thinly built, appears chronically ill HENT: pupils equally reacting to light,  No scleral pallor or  icterus noted. Oral mucosa is moist.  Chest:   Diminished breath sounds bilaterally.  No obvious wheezes noted. CVS: S1 &S2 heard. No murmur.  Regular rate and rhythm. Abdomen: Soft, nontender, nondistended.  Bowel sounds are heard.   Extremities: No cyanosis, clubbing or edema.  Peripheral pulses are palpable. Psych: Alert, awake and oriented, normal mood CNS:  No cranial nerve deficits.  Moves all extremities. Skin: Warm and dry.  No rashes noted.  The results of significant diagnostics from this hospitalization (including imaging, microbiology, ancillary and laboratory) are listed below for reference.     Diagnostic Studies:   DG Chest Port 1 View Result Date: 07/01/2023 CLINICAL DATA:  10026 Shortness of breath 10026 EXAM: PORTABLE CHEST - 1 VIEW COMPARISON:  May 11, 2023 FINDINGS: Hyperexpanded lungs with architectural distortion in the lung apices, consistent with emphysema. Similar scarring and bronchiectasis in the left lung apex with overlying pleural thickening. No focal airspace consolidation, pleural effusion, or pneumothorax. No cardiomegaly. Left chest pacemaker with leads terminating in the right atrium and right ventricle. No acute fracture or destructive lesion. IMPRESSION: Emphysema.  No acute cardiopulmonary abnormality. Electronically Signed   By: Rogelia Myers M.D.   On: 07/01/2023 19:02     Labs:   Basic Metabolic Panel: Recent Labs  Lab 07/01/23 1950 07/01/23 2150 07/02/23 0617 07/03/23 0633  NA 141 140 137 137  K 3.8 3.6 4.2 4.2  CL 103  --  100 101  CO2 30  --  29 29  GLUCOSE 90  --  240* 140*  BUN 22  --  22 31*  CREATININE 0.85  --  0.83 0.76  CALCIUM 8.9  --  8.2* 8.5*  MG  --   --  2.3  --    GFR Estimated Creatinine Clearance: 76.4 mL/min (by C-G formula based on SCr of 0.76 mg/dL). Liver Function Tests: Recent Labs  Lab 07/02/23 0617 07/03/23 0633  AST 29 22  ALT 28 29  ALKPHOS 99 93  BILITOT 0.6 0.6  PROT 6.0* 5.9*  ALBUMIN 3.3* 3.3*    No results for input(s): LIPASE, AMYLASE in the last 168 hours. No results for input(s): AMMONIA in the last 168 hours. Coagulation profile No results for input(s): INR, PROTIME in the last 168 hours.  CBC: Recent Labs  Lab 07/01/23 1950 07/01/23 2150 07/02/23 0617 07/03/23 0633  WBC 15.6*  --  11.1* 14.1*  NEUTROABS  --   --  10.7*  --   HGB 13.9 14.6 13.3 13.4  HCT 41.9 43.0 42.1 40.7  MCV 102.2*  --  107.4* 104.6*  PLT 195  --  179 196   Cardiac Enzymes: No results for input(s): CKTOTAL, CKMB, CKMBINDEX, TROPONINI in the last 168 hours. BNP: Invalid input(s): POCBNP CBG: No results for input(s): GLUCAP in the last 168 hours. D-Dimer No results for input(s): DDIMER in the last 72 hours. Hgb A1c Recent Labs    07/02/23 1207  HGBA1C 5.2   Lipid Profile No results for input(s): CHOL, HDL,  LDLCALC, TRIG, CHOLHDL, LDLDIRECT in the last 72 hours. Thyroid  function studies No results for input(s): TSH, T4TOTAL, T3FREE, THYROIDAB in the last 72 hours.  Invalid input(s): FREET3 Anemia work up No results for input(s): VITAMINB12, FOLATE, FERRITIN, TIBC, IRON, RETICCTPCT in the last 72 hours. Microbiology Recent Results (from the past 240 hours)  Resp panel by RT-PCR (RSV, Flu A&B, Covid) Anterior Nasal Swab     Status: None   Collection Time: 07/01/23  7:59 PM   Specimen: Anterior Nasal Swab  Result Value Ref Range Status   SARS Coronavirus 2 by RT PCR NEGATIVE NEGATIVE Final    Comment: (NOTE) SARS-CoV-2 target nucleic acids are NOT DETECTED.  The SARS-CoV-2 RNA is generally detectable in upper respiratory specimens during the acute phase of infection. The lowest concentration of SARS-CoV-2 viral copies this assay can detect is 138 copies/mL. A negative result does not preclude SARS-Cov-2 infection and should not be used as the sole basis for treatment or other patient management decisions. A negative result  may occur with  improper specimen collection/handling, submission of specimen other than nasopharyngeal swab, presence of viral mutation(s) within the areas targeted by this assay, and inadequate number of viral copies(<138 copies/mL). A negative result must be combined with clinical observations, patient history, and epidemiological information. The expected result is Negative.  Fact Sheet for Patients:  BloggerCourse.com  Fact Sheet for Healthcare Providers:  SeriousBroker.it  This test is no t yet approved or cleared by the [place]  FDA and  has been authorized for detection and/or diagnosis of SARS-CoV-2 by FDA under an Emergency Use Authorization (EUA). This EUA will remain  in effect (meaning this test can be used) for the duration of the COVID-19 declaration under Section 564(b)(1) of the Act, 21 U.S.C.section 360bbb-3(b)(1), unless the authorization is terminated  or revoked sooner.       Influenza A by PCR NEGATIVE NEGATIVE Final   Influenza B by PCR NEGATIVE NEGATIVE Final    Comment: (NOTE) The Xpert Xpress SARS-CoV-2/FLU/RSV plus assay is intended as an aid in the diagnosis of influenza from Nasopharyngeal swab specimens and should not be used as a sole basis for treatment. Nasal washings and aspirates are unacceptable for Xpert Xpress SARS-CoV-2/FLU/RSV testing.  Fact Sheet for Patients: BloggerCourse.com  Fact Sheet for Healthcare Providers: SeriousBroker.it  This test is not yet approved or cleared by the [place]  FDA and has been authorized for detection and/or diagnosis of SARS-CoV-2 by FDA under an Emergency Use Authorization (EUA). This EUA will remain in effect (meaning this test can be used) for the duration of the COVID-19 declaration under Section 564(b)(1) of the Act, 21 U.S.C. section 360bbb-3(b)(1), unless the authorization is terminated  or revoked.     Resp Syncytial Virus by PCR NEGATIVE NEGATIVE Final    Comment: (NOTE) Fact Sheet for Patients: BloggerCourse.com  Fact Sheet for Healthcare Providers: SeriousBroker.it  This test is not yet approved or cleared by the [place]  FDA and has been authorized for detection and/or diagnosis of SARS-CoV-2 by FDA under an Emergency Use Authorization (EUA). This EUA will remain in effect (meaning this test can be used) for the duration of the COVID-19 declaration under Section 564(b)(1) of the Act, 21 U.S.C. section 360bbb-3(b)(1), unless the authorization is terminated or revoked.  Performed at [facility], [address] [place], [place] [address]      Discharge Instructions:   Discharge Instructions     Diet general   Complete by: As directed  Discharge instructions   Complete by: As directed    Follow up with your primary care provider in a week. Seek medical attention for worsening symptoms. Please use nicotine  patch to quit smoking. No overexertion. Take medications as prescribed.   Increase activity slowly   Complete by: As directed       Allergies as of 07/04/2023       Reactions   Shellfish Allergy  Anaphylaxis   Amitriptyline    'can't remember what happens'         Medication List     TAKE these medications    albuterol  1.25 MG/3ML nebulizer solution Commonly known as: ACCUNEB  2.5 mg every 4 hours as needed for cough, shortness of breath and/or wheezing   Ventolin  HFA 108 (90 Base) MCG/ACT inhaler Generic drug: albuterol  INHALE UP TO  2 PUFFS BY MOUTH EVERY 4 HOURS AS NEEDED   aspirin EC 81 MG tablet Take 81 mg by mouth in the morning and at bedtime. Swallow whole.   azithromycin  250 MG tablet Commonly known as: ZITHROMAX  Take 1 tablet (250 mg total) by mouth daily for 3 days.   desloratadine  5 MG tablet Commonly known as: CLARINEX  Take 1 tablet (5 mg  total) by mouth daily. What changed: when to take this   EPINEPHrine  0.3 mg/0.3 mL Soaj injection Commonly known as: EPI-PEN Inject 0.3 mg into the muscle as needed for anaphylaxis.   fluticasone  50 MCG/ACT nasal spray Commonly known as: FLONASE  Place 2 sprays into both nostrils daily.   guaiFENesin  100 MG/5ML liquid Commonly known as: ROBITUSSIN Take 5 mLs by mouth every 6 (six) hours.   hydrOXYzine  25 MG tablet Commonly known as: ATARAX  Take 1 tablet (25 mg total) by mouth every 6 (six) hours as needed for anxiety.   lactose free nutrition Liqd Take 237 mLs by mouth 3 (three) times daily with meals.   melatonin 3 MG Tabs tablet Take 1 tablet (3 mg total) by mouth at bedtime as needed (insomnia).   mirtazapine  30 MG tablet Commonly known as: Remeron  Take 1 tablet (30 mg total) by mouth at bedtime.   MUCINEX  ALLERGY  PO Take 1 tablet by mouth in the morning and at bedtime.   nicotine  7 mg/24hr patch Commonly known as: NICODERM CQ  - dosed in mg/24 hr Place 1 patch (7 mg total) onto the skin daily.   omeprazole  40 MG capsule Commonly known as: PRILOSEC Take 1 capsule (40 mg total) by mouth 2 (two) times daily.   ondansetron  4 MG disintegrating tablet Commonly known as: ZOFRAN -ODT Take 1 tablet (4 mg total) by mouth every 8 (eight) hours as needed for nausea or vomiting.   predniSONE  50 MG tablet Commonly known as: DELTASONE  Take 1 tablet (50 mg total) by mouth daily with breakfast for 3 days. What changed: You were already taking a medication with the same name, and this prescription was added. Make sure you understand how and when to take each.   predniSONE  10 MG tablet Commonly known as: DELTASONE  Take 1 tablet (10 mg total) by mouth at bedtime. 2 until better then 1 daily Start taking on: July 08, 2023 What changed:  how much to take how to take this when to take this These instructions start on July 08, 2023. If you are unsure what to do until then, ask your  doctor or other care provider.   Stiolto Respimat  2.5-2.5 MCG/ACT Aers Generic drug: Tiotropium Bromide -Olodaterol Inhale 2 puffs into the lungs daily.   Vitamin D  (Ergocalciferol ) 1.25  MG (50000 UNIT) Caps capsule Commonly known as: DRISDOL  Take 1 capsule (50,000 Units total) by mouth every Sunday.          Time coordinating discharge: 39 minutes  Signed:  Ernesto Zukowski  [facility] 07/04/2023, 10:18 AM

## 2023-07-04 NOTE — Progress Notes (Signed)
   07/04/23 1001  TOC Brief Assessment  Insurance and Status Reviewed  Patient has primary care physician Yes  Home environment has been reviewed home w/ family  Prior level of function: independent  Prior/Current Home Services No current home services  Social Drivers of Health Review SDOH reviewed no interventions necessary  Readmission risk has been reviewed Yes  Transition of care needs no transition of care needs at this time

## 2023-07-05 ENCOUNTER — Telehealth: Payer: Self-pay | Admitting: *Deleted

## 2023-07-05 NOTE — Transitions of Care (Post Inpatient/ED Visit) (Signed)
   07/05/2023  Name: Michael [name] MRN: [id number] DOB: [birth year]  Today's TOC FU Call Status: Today's TOC FU Call Status:: Unsuccessful Call (1st Attempt) Unsuccessful Call (1st Attempt) Date: 07/05/23  Attempted to reach the patient regarding the most recent Inpatient/ED visit.  Follow Up Plan: Additional outreach attempts will be made to reach the patient to complete the Transitions of Care (Post Inpatient/ED visit) call.   Cathlean Headland BSN RN [facility] Health Care Management Coordinator [email or web address] Direct Dial: [phone]  Fax: [phone] Website: [email or web address]

## 2023-07-06 ENCOUNTER — Other Ambulatory Visit: Payer: Self-pay

## 2023-07-06 DIAGNOSIS — R1319 Other dysphagia: Secondary | ICD-10-CM

## 2023-07-06 MED ORDER — OMEPRAZOLE 40 MG PO CPDR: 40.0000 mg | DELAYED_RELEASE_CAPSULE | Freq: Two times a day (BID) | ORAL | 11 refills | Status: DC

## 2023-07-13 ENCOUNTER — Other Ambulatory Visit (INDEPENDENT_AMBULATORY_CARE_PROVIDER_SITE_OTHER): Payer: Self-pay | Admitting: Otolaryngology

## 2023-07-14 ENCOUNTER — Other Ambulatory Visit: Payer: Self-pay

## 2023-07-14 DIAGNOSIS — Z122 Encounter for screening for malignant neoplasm of respiratory organs: Secondary | ICD-10-CM

## 2023-07-14 DIAGNOSIS — Z87891 Personal history of nicotine dependence: Secondary | ICD-10-CM

## 2023-07-14 DIAGNOSIS — F17210 Nicotine dependence, cigarettes, uncomplicated: Secondary | ICD-10-CM

## 2023-07-14 DIAGNOSIS — R1319 Other dysphagia: Secondary | ICD-10-CM

## 2023-07-14 MED ORDER — OMEPRAZOLE 40 MG PO CPDR: 40.0000 mg | DELAYED_RELEASE_CAPSULE | Freq: Two times a day (BID) | ORAL | 11 refills | Status: AC

## 2023-07-27 NOTE — Progress Notes (Signed)
 Remote pacemaker transmission.

## 2023-07-27 NOTE — Addendum Note (Signed)
 Addended by: VICCI SELLER A on: 07/27/2023 12:06 PM   Modules accepted: Orders

## 2023-08-07 ENCOUNTER — Ambulatory Visit (INDEPENDENT_AMBULATORY_CARE_PROVIDER_SITE_OTHER): Admitting: Family

## 2023-08-07 ENCOUNTER — Ambulatory Visit: Payer: Self-pay | Admitting: Family

## 2023-08-07 ENCOUNTER — Encounter: Payer: Self-pay | Admitting: Family

## 2023-08-07 ENCOUNTER — Ambulatory Visit: Payer: Self-pay

## 2023-08-07 VITALS — BP 115/73 | HR 78 | Temp 97.5°F | Ht 69.0 in | Wt 130.2 lb

## 2023-08-07 DIAGNOSIS — J441 Chronic obstructive pulmonary disease with (acute) exacerbation: Secondary | ICD-10-CM

## 2023-08-07 LAB — CBC WITH DIFFERENTIAL/PLATELET
Basophils Absolute: 0.1 K/uL (ref 0.0–0.1)
Basophils Relative: 1 % (ref 0.0–3.0)
Eosinophils Absolute: 0.2 K/uL (ref 0.0–0.7)
Eosinophils Relative: 2.7 % (ref 0.0–5.0)
HCT: 42.4 % (ref 39.0–52.0)
Hemoglobin: 14.3 g/dL (ref 13.0–17.0)
Lymphocytes Relative: 24 % (ref 12.0–46.0)
Lymphs Abs: 1.7 K/uL (ref 0.7–4.0)
MCHC: 33.9 g/dL (ref 30.0–36.0)
MCV: 99.2 fl (ref 78.0–100.0)
Monocytes Absolute: 0.6 K/uL (ref 0.1–1.0)
Monocytes Relative: 8.7 % (ref 3.0–12.0)
Neutro Abs: 4.4 K/uL (ref 1.4–7.7)
Neutrophils Relative %: 63.6 % (ref 43.0–77.0)
Platelets: 219 K/uL (ref 150.0–400.0)
RBC: 4.27 Mil/uL (ref 4.22–5.81)
RDW: 13.2 % (ref 11.5–15.5)
WBC: 6.9 K/uL (ref 4.0–10.5)

## 2023-08-07 LAB — COMPREHENSIVE METABOLIC PANEL WITH GFR
ALT: 19 U/L (ref 0–53)
AST: 18 U/L (ref 0–37)
Albumin: 3.9 g/dL (ref 3.5–5.2)
Alkaline Phosphatase: 96 U/L (ref 39–117)
BUN: 21 mg/dL (ref 6–23)
CO2: 31 meq/L (ref 19–32)
Calcium: 9.2 mg/dL (ref 8.4–10.5)
Chloride: 103 meq/L (ref 96–112)
Creatinine, Ser: 0.92 mg/dL (ref 0.40–1.50)
GFR: 89.1 mL/min (ref >60.00)
Glucose, Bld: 85 mg/dL (ref 70–99)
Potassium: 4.1 meq/L (ref 3.5–5.1)
Sodium: 140 meq/L (ref 135–145)
Total Bilirubin: 0.5 mg/dL (ref 0.2–1.2)
Total Protein: 6.4 g/dL (ref 6.0–8.3)

## 2023-08-07 MED ORDER — PREDNISONE 10 MG PO TABS: ORAL_TABLET | ORAL | 0 refills | Status: DC

## 2023-08-07 MED ORDER — BENZONATATE 200 MG PO CAPS: 200.0000 mg | ORAL_CAPSULE | Freq: Two times a day (BID) | ORAL | 1 refills | Status: DC | PRN

## 2023-08-07 NOTE — Progress Notes (Signed)
 Patient ID: Michael [name], male    DOB: [birth year], 63 y.o.   MRN: [id number]  Chief Complaint  Patient presents with   Shortness of Breath    Pt c/o SOB, unable to walk for short periods of time.   Discussed the use of AI scribe software for clinical note transcription with the patient, who gave verbal consent to proceed.  History of Present Illness Michael [name] is a 63 year old male with COPD who presents with persistent cough and difficulty breathing after hospitalization from 6/28-07/04/2023.  Cough and sputum production - Daily persistent cough since hospital discharge one month ago - Cough is productive of cloudy phlegm - No improvement in cough since discharge - Possible exacerbation due to family members with URI sx last week. - Reports taking Tessalon  pearles in past, prescribed ongoing? but has no refills  Dyspnea and wheezing - Episodes of dyspnea throughout the day, but worse with exertion - Wheezing occurs with exertion - Uses Ventolin  inhaler during episodes of wheezing - Difficulty breathing has persisted since hospitalization, discharged on prednisone , Stiolto inhaler  Inhaler and nebulizer use - Uses Stiolto inhaler  as directed daily - Uses emergency inhaler as needed, last used yesterday - Not regularly using nebulizer or emergency inhalers, aiming to reduce dependence  Corticosteroid therapy - On daily prednisone  since hospital discharge, initially at a high dose, takes 10mg  qd chronically  Smoking cessation - Quit smoking after hospitalization one month ago - Initially used nicotine  patches to aid cessation  Specialist follow-up - Follow-up with Pulmonology scheduled for end of August  Assessment & Plan Chronic obstructive pulmonary disease (COPD) with chronic cough Chronic cough persists post-hospitalization with cloudy sputum. Wheezing noted, especially with exertion. Possible reinfection considered. - Recommend nebulizer use at least twice daily  for the next week, morning and bedtime. - Prescribe prednisone  with dosage increase for a 7 days, then taper back to daily 10mg  dose. - Order blood work for white blood cell count and neutrophil levels. - Consider antibiotics if infection indicated by blood work. - Encourage emergency inhaler (Ventolin ) use during wheezing or exertion, whenever leaving the house. - Refill Tessalon  Perles for persistent bothersome cough. - Call pulmonology office and move regular f/u up sooner if possible d/t recent hospital visit  Nicotine  dependence, cigarettes, in remission Nicotine  dependence in remission post-hospitalization. No current cravings. - Encourage continued smoking abstinence. - Advise nicotine  patches if cravings return.   Subjective:    Outpatient Medications Prior to Visit  Medication Sig Dispense Refill   albuterol  (ACCUNEB ) 1.25 MG/3ML nebulizer solution 2.5 mg every 4 hours as needed for cough, shortness of breath and/or wheezing 150 mL 5   aspirin  EC 81 MG tablet Take 81 mg by mouth in the morning and at bedtime. Swallow whole.     desloratadine  (CLARINEX ) 5 MG tablet Take 1 tablet by mouth once daily 90 tablet 0   EPINEPHrine  0.3 mg/0.3 mL IJ SOAJ injection Inject 0.3 mg into the muscle as needed for anaphylaxis. 1 each 1   Fexofenadine HCl (MUCINEX  ALLERGY  PO) Take 1 tablet by mouth in the morning and at bedtime.     fluticasone  (FLONASE ) 50 MCG/ACT nasal spray Place 2 sprays into both nostrils daily. 16 g 6   lactose free nutrition (BOOST PLUS) LIQD Take 237 mLs by mouth 3 (three) times daily with meals.     melatonin 3 MG TABS tablet Take 1 tablet (3 mg total) by mouth at bedtime as needed (insomnia). 90  tablet 0   mirtazapine  (REMERON ) 30 MG tablet Take 1 tablet (30 mg total) by mouth at bedtime. 90 tablet 1   omeprazole  (PRILOSEC) 40 MG capsule Take 1 capsule (40 mg total) by mouth 2 (two) times daily. 60 capsule 11   ondansetron  (ZOFRAN -ODT) 4 MG disintegrating tablet Take 1  tablet (4 mg total) by mouth every 8 (eight) hours as needed for nausea or vomiting. 20 tablet 0   predniSONE  (DELTASONE ) 10 MG tablet Take 1 tablet (10 mg total) by mouth at bedtime. 2 until better then 1 daily 90 tablet 2   Tiotropium Bromide -Olodaterol (STIOLTO RESPIMAT ) 2.5-2.5 MCG/ACT AERS Inhale 2 puffs into the lungs daily. 12 g 3   VENTOLIN  HFA 108 (90 Base) MCG/ACT inhaler INHALE UP TO  2 PUFFS BY MOUTH EVERY 4 HOURS AS NEEDED 54 g 3   guaiFENesin  (ROBITUSSIN) 100 MG/5ML liquid Take 5 mLs by mouth every 6 (six) hours. 120 mL 0   nicotine  (NICODERM CQ  - DOSED IN MG/24 HR) 7 mg/24hr patch Place 1 patch (7 mg total) onto the skin daily. 28 patch 1   Vitamin D , Ergocalciferol , (DRISDOL ) 1.25 MG (50000 UNIT) CAPS capsule Take 1 capsule (50,000 Units total) by mouth every Sunday. 12 capsule 3   No facility-administered medications prior to visit.   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   GALLBLADDER SURGERY  2003   HAND SURGERY  1998   x3   OPEN REDUCTION INTERNAL FIXATION (ORIF) DISTAL RADIAL FRACTURE Right 09/07/2016   Procedure: RIGHT WRIST OPEN REDUCTION INTERNAL  FIXATION (ORIF) REPAIR AS INDICATED;  Surgeon: Shari Easter, MD;  Location: [facility] OR;  Service: Orthopedics;  Laterality: Right;   PACEMAKER INSERTION  04/23/2003   [employer] Kappa; [facility] - symptomatic bradycardia   PERMANENT PACEMAKER GENERATOR CHANGE N/A 09/06/2012   Procedure: PERMANENT PACEMAKER GENERATOR CHANGE;  Surgeon: Jerel Balding, MD;  Location: [facility] CATH LAB;  Service: Cardiovascular;  Laterality: N/A;   TONSILLECTOMY     TRANSTHORACIC ECHOCARDIOGRAM  05/14/2012   EF 50-55%, normal systolic function; mild MR (ordered for mitral valve disease 424.0)   Allergies  Allergen Reactions   Shellfish Allergy  Anaphylaxis   Amitriptyline     'can't remember what happens'       Objective:    Physical Exam Vitals and nursing note reviewed.  Constitutional:      General: He is not in acute distress.    Appearance: Normal appearance.  HENT:  Head: Normocephalic.  Cardiovascular:     Rate and Rhythm: Normal rate and regular rhythm.  Pulmonary:     Effort: Pulmonary effort is normal.     Breath sounds: Examination of the right-upper field reveals wheezing. Examination of the left-upper field reveals wheezing. Examination of the right-middle field reveals wheezing. Wheezing (expiratory) present.  Musculoskeletal:        General: Normal range of motion.     Cervical back: Normal range of motion.  Skin:    General: Skin is warm and dry.  Neurological:     Mental Status: He is alert and oriented to person, place, and time.  Psychiatric:        Mood and Affect: Mood normal.    BP 115/73 (BP Location: Left Arm, Patient Position: Sitting, Cuff Size: Large)   Pulse 78   Temp (!) 97.5 F (36.4 C) (Temporal)   Ht 5' 9 (1.753 m)   Wt 130 lb 3.2 oz (59.1 kg)   SpO2 94%   BMI 19.23 kg/m  Wt Readings from Last 3 Encounters:  08/07/23 130 lb 3.2 oz (59.1 kg)  07/04/23 124 lb 5.4 oz (56.4 kg)  06/02/23 115 lb 12.8 oz (52.5 kg)       Lucius Krabbe,  NP

## 2023-08-07 NOTE — Progress Notes (Signed)
 Labs look great, white cell count back down to normal, no bacterial infection noted, most likely has either a viral infection, or just taking longer to get over the COPD exacerbation. The prednisone  I sent in should help - remind him to use the nebulizer 2-3 times per day until cough under control. Keep up the good work with not smoking!

## 2023-08-07 NOTE — Telephone Encounter (Signed)
 Appt today

## 2023-08-07 NOTE — Telephone Encounter (Signed)
 FYI Only or Action Required?: FYI only for provider.  Patient was last seen in primary care on 10/04/2022 by Kennyth Worth HERO, MD.  Called Nurse Triage reporting No chief complaint on file..  Symptoms began several weeks ago.  Interventions attempted: Rest, hydration, or home remedies.  Symptoms are: gradually worsening.  Triage Disposition: See HCP Within 4 Hours (Or PCP Triage)  Patient/caregiver understands and will follow disposition?: Yes  Copied from CRM [id number]. Topic: Clinical - Red Word Triage >> Aug 07, 2023  7:39 AM Michael [name] wrote: Red Word that prompted transfer to Nurse Triage: Sick .Michael [name] Trouble breathing, shortness of breath Reason for Disposition  [1] Longstanding difficulty breathing (e.g., CHF, COPD, emphysema) AND [2] WORSE than normal  Answer Assessment - Initial Assessment Questions 1. RESPIRATORY STATUS: Describe your breathing? (e.g., wheezing, shortness of breath, unable to speak, severe coughing)      Dyspnea with exertion  2. ONSET: When did this breathing problem begin?      Several Weeks ago  3. PATTERN Does the difficult breathing come and go, or has it been constant since it started?      Constant  4. SEVERITY: How bad is your breathing? (e.g., mild, moderate, severe)      Mostly with exertion  5. RECURRENT SYMPTOM: Have you had difficulty breathing before? If Yes, ask: When was the last time? and What happened that time?      Yes  6. CARDIAC HISTORY: Do you have any history of heart disease? (e.g., heart attack, angina, bypass surgery, angioplasty)      Pacemaker  7. LUNG HISTORY: Do you have any history of lung disease?  (e.g., pulmonary embolus, asthma, emphysema)     COPD  8. CAUSE: What do you think is causing the breathing problem?      Unsure, possibly bronchitis  9. OTHER SYMPTOMS: Do you have any other symptoms? (e.g., chest pain, cough, dizziness, fever, runny nose)      Complained about being hot, runny nose  previously  10. O2 SATURATION MONITOR:  Do you use an oxygen  saturation monitor (pulse oximeter) at home? If Yes, ask: What is your reading (oxygen  level) today? What is your usual oxygen  saturation reading? (e.g., 95%)       Unsure  12. TRAVEL: Have you traveled out of the country in the last month? (e.g., travel history, exposures)       Exposed to partner who was sick with Bronchitis  Protocols used: Breathing Difficulty-A-AH

## 2023-08-31 NOTE — Progress Notes (Unsigned)
 Subjective:     Patient ID: Michael [name], male   DOB: February 11, 1960      MRN: [id number]    Brief patient profile:  63yowm MM/active smoker referred to pulmonary clinic 12/08/2015 by Dr  Juli with GOLD III copd documented 08/19/13     History of Present Illness  10/11/2017  f/u ov/Natalija Mavis re: COPD III/ still smoking maint on symb/ spiriva  dpi  Chief Complaint  Patient presents with   Follow-up    Breathing depends on the heat outside- he is unsure of meds and states needs refills on everything.    Dyspnea:  MMRC3 = can't walk 100 yards even at a slow pace at a flat grade s stopping due to sob   Cough: provoked  with active smoking and ? From dpi spiriva  also/ min mucoid  Sleeping: bed flat with a couple of pillows  SABA use: way too much when weather hot  02: none  rec Change spiriva  to Respimat form  and use 2 pffs first thing in am only to follow the symbicort  x 2 puffs only in the am, then take the other 2 pffs of symbicort  12 hours later  Work on inhaler technique:  The key is to stop smoking completely before smoking completely stops you!  Please schedule a follow up visit in 3 months but call sooner if needed  with all medications    06/02/2023  f/u ov/[place] office/Welden Hausmann re: GOLD 3  maint on stiolto and prn saba  off prednsione x  a while/ still smoking  Chief Complaint  Patient presents with   COPD  Dyspnea:  100 ft - never pretreating with saba in any form Much better on prednisone   Cough: dry sounding / mucoid min production  Sleeping: propped up on couch  s  resp cc  SABA use: not using abc plan  02: none  Rec Plan A = Automatic = Always=    stiolto 2 puffs each am and prednisone  10 mg 2 daily until better then one daily   Work on inhaler technique:  relax and gently blow all the way out then take a nice smooth full deep breath back in, triggering the inhaler at same time you start breathing in.  Hold breath in for at least  5 seconds if you can.   Rinse and  gargle with water when done.  If mouth or throat bother you at all,  try brushing teeth/gums/tongue with arm and hammer toothpaste/ make a slurry and gargle and spit out.   >>>  Remember how golfers warm up by taking practice swings - do this with an empty inhaler   Plan B = Backup (to supplement plan A, not to replace it) Only use your albuterol  inhaler as a rescue medication to be used if you can't catch your breath by resting or doing a relaxed purse lip breathing pattern.  - The less you use it, the better it will work when you need it. - Ok to use the inhaler up to 2 puffs  every 4 hours if you must but call for appointment if use goes up over your usual need - Don't leave home without it !!  (think of it like the spare tire for your car)   Plan C = Crisis (instead of Plan B but only if Plan B stops working) - only use your albuterol  nebulizer if you first try Plan B and it fails to help > ok to use the nebulizer up  to every 4 hours but if start needing it regularly call for immediate appointment   Nicotine  7 mg patch  Please schedule a follow up visit in 3 months but call sooner if needed      09/01/2023  f/u ov/[place] office/Rayshawn Maney re: GOLD 3 COPD  maint on ***  No chief complaint on file.   Dyspnea:  *** Cough: *** Sleeping: ***   resp cc  SABA use: *** 02: ***  Lung cancer screening: ***   No obvious day to day or daytime variability or assoc excess/ purulent sputum or mucus plugs or hemoptysis or cp or chest tightness, subjective wheeze or overt sinus or hb symptoms.    Also denies any obvious fluctuation of symptoms with weather or environmental changes or other aggravating or alleviating factors except as outlined above   No unusual exposure hx or h/o childhood pna/ asthma or knowledge of premature birth.  Current Allergies, Complete Past Medical History, Past Surgical History, Family History, and Social History were reviewed in [employer]  record.  ROS  The following are not active complaints unless bolded Hoarseness, sore throat, dysphagia, dental problems, itching, sneezing,  nasal congestion or discharge of excess mucus or purulent secretions, ear ache,   fever, chills, sweats, unintended wt loss or wt gain, classically pleuritic or exertional cp,  orthopnea pnd or arm/hand swelling  or leg swelling, presyncope, palpitations, abdominal pain, anorexia, nausea, vomiting, diarrhea  or change in bowel habits or change in bladder habits, change in stools or change in urine, dysuria, hematuria,  rash, arthralgias, visual complaints, headache, numbness, weakness or ataxia or problems with walking or coordination,  change in mood or  memory.        No outpatient medications have been marked as taking for the 09/01/23 encounter (Appointment) with Darlean Ozell NOVAK, MD.                    Objective:   Physical Exam  Wts  09/01/2023      ***  06/02/2023      115  12/05/2022      127  08/25/2022      126   05/26/2022      125  10/13/2021       133  04/15/2021         136  10/06/2020         135  10/04/2019         126  02/19/2019         137  01/12/2018       137  10/11/2017       131  07/11/2017         125  01/11/2017         139  07/11/2016         144   04/11/2016        147          12/08/15 157 lb 3.2 oz (71.3 kg)  11/04/15 153 lb 12.8 oz (69.8 kg)  08/25/15 160 lb 12.8 oz (72.9 kg)    Vital signs reviewed  09/01/2023  - Note at rest 02 sats  ***% on ***   General appearance:    ***  Mod barre***         Assessment:

## 2023-08-31 NOTE — Patient Instructions (Incomplete)
 SABRA

## 2023-09-01 ENCOUNTER — Ambulatory Visit (HOSPITAL_COMMUNITY)
Admission: RE | Admit: 2023-09-01 | Discharge: 2023-09-01 | Disposition: A | Discharge status: Home / Self Care | Source: Ambulatory Visit | Attending: Internal Medicine | Admitting: Internal Medicine

## 2023-09-01 ENCOUNTER — Ambulatory Visit: Admitting: Internal Medicine

## 2023-09-01 ENCOUNTER — Encounter: Payer: Self-pay | Admitting: Internal Medicine

## 2023-09-01 VITALS — BP 126/81 | HR 87 | Ht 69.0 in | Wt 135.6 lb

## 2023-09-01 DIAGNOSIS — R059 Cough, unspecified: Secondary | ICD-10-CM | POA: Diagnosis not present

## 2023-09-01 DIAGNOSIS — J449 Chronic obstructive pulmonary disease, unspecified: Secondary | ICD-10-CM | POA: Insufficient documentation

## 2023-09-01 DIAGNOSIS — Z23 Encounter for immunization: Secondary | ICD-10-CM | POA: Diagnosis not present

## 2023-09-01 DIAGNOSIS — J439 Emphysema, unspecified: Secondary | ICD-10-CM | POA: Diagnosis not present

## 2023-09-01 MED ORDER — BUDESONIDE 0.25 MG/2ML IN SUSP: RESPIRATORY_TRACT | 12 refills | Status: DC

## 2023-09-01 NOTE — Assessment & Plan Note (Addendum)
 MM/quit smoking 07/01/23  - reports quit smokng 06/2013> resumed by ov 04/11/2016   - Spirometry 08/19/2013  FEV1 1.85 ( 47%) ratio 44  - Spirometry 12/08/2015   FEV1 1.53 (43%)  Ratio 40 off maint rx  - 12/08/2015  After extensive coaching HFA effectiveness =    75% > start symb 160/atrovent  qid   - Allergy  profile 12/08/15  >  Eos 0.0 /  IgE  347  RAST pos dust/ mold - [employer] AT screen 12/08/15 > MM/ levels ok   - 10/11/2017    changed to spiriva  smi   - 05/26/2022  After extensive coaching inhaler device,  effectiveness =  80% from baseline of 50%with smi and severe cough on insp hfa  > try stiolto 2 each am and pred 20 until better then 10 mg daily  Chest LDSCT     07/06/22    severe centrilobular  and paraseptal emphysema -  06/02/2023  After extensive coaching inhaler device,  effectiveness =    75% (short Ti) and restart prednisone  x 20 until better and 10 mg daily  - 09/01/2023 added flutter valve and alb/bud 0.25 neb up to qid    Group D (now reclassified as E) in terms of symptom/risk and laba/lama/ICS  therefore appropriate rx at this point >>>  stiolto plus will add budesonide  per neb and prednisone  weaned to the lowest tolerated dose/ explained to pt and his mother.   For cough / congestion adding flutter today   - see avs for instructions unique to this ov   Very encourage re stopping smoking >> F/u can be q 3 m, sooner prn       Each maintenance medication was reviewed in detail including emphasizing most importantly the difference between maintenance and prns and under what circumstances the prns are to be triggered using an action plan format where appropriate.  Total time for H and P, chart review, counseling, reviewing hfa/neb/ flutter valve  device(s) and generating customized AVS unique to this office visit / same day charting = 42 min with pt with chronic  refractory respiratory  symptoms

## 2023-09-06 ENCOUNTER — Ambulatory Visit: Payer: Medicare HMO

## 2023-09-12 ENCOUNTER — Telehealth: Payer: Self-pay | Admitting: *Deleted

## 2023-09-12 NOTE — Telephone Encounter (Signed)
 Copied from CRM #[id number]. Topic: Clinical - Medication Question >> Sep 12, 2023 10:03 AM Drema MATSU wrote: Reason for CRM: Patient stated that [facility] on [facility] is needing a covid vaccine prescription for him.  Spoke with patient, no Rx needed or an appt  Verbalized understanding  Deangleo Passage,RMA

## 2023-09-13 ENCOUNTER — Ambulatory Visit: Payer: Self-pay | Admitting: Internal Medicine

## 2023-09-13 NOTE — Telephone Encounter (Signed)
 Ok with me. Please place any necessary orders.

## 2023-09-19 ENCOUNTER — Other Ambulatory Visit (HOSPITAL_BASED_OUTPATIENT_CLINIC_OR_DEPARTMENT_OTHER): Payer: Self-pay

## 2023-09-19 MED ORDER — FLUZONE 0.5 ML IM SUSY
0.5000 mL | PREFILLED_SYRINGE | Freq: Once | INTRAMUSCULAR | 0 refills | Status: AC
Start: 2023-09-19 — End: 2023-09-20
  Filled 2023-09-19: qty 0.5, 1d supply, fill #0

## 2023-09-19 MED ORDER — COMIRNATY 30 MCG/0.3ML IM SUSY
0.3000 mL | PREFILLED_SYRINGE | Freq: Once | INTRAMUSCULAR | 0 refills | Status: AC
  Filled 2023-09-19: qty 0.3, 1d supply, fill #0

## 2023-09-28 ENCOUNTER — Other Ambulatory Visit: Payer: Self-pay | Admitting: Family Medicine

## 2023-09-28 NOTE — Telephone Encounter (Unsigned)
 Copied from CRM #[id number]. Topic: Clinical - Medication Refill >> Sep 28, 2023  3:06 PM Shereese L wrote: Medication: Vitamin D , Ergocalciferol , (DRISDOL ) 1.25 MG (50000 UNIT) CAPS capsule  Has the patient contacted their pharmacy? Yes (Agent: If no, request that the patient contact the pharmacy for the refill. If patient does not wish to contact the pharmacy document the reason why and proceed with request.) (Agent: If yes, when and what did the pharmacy advise?)  This is the patient's preferred pharmacy:  [facility] [address] [place] - [address] [place] [address] Phone: [phone] Fax: [phone]  Is this the correct pharmacy for this prescription? Yes If no, delete pharmacy and type the correct one.   Has the prescription been filled recently? Yes  Is the patient out of the medication? Yes  Has the patient been seen for an appointment in the last year OR does the patient have an upcoming appointment? Yes  Can we respond through MyChart? Yes  Agent: Please be advised that Rx refills may take up to 3 business days. We ask that you follow-up with your pharmacy.

## 2023-10-04 ENCOUNTER — Ambulatory Visit (INDEPENDENT_AMBULATORY_CARE_PROVIDER_SITE_OTHER): Admitting: Physician Assistant

## 2023-10-04 ENCOUNTER — Ambulatory Visit: Payer: Self-pay

## 2023-10-04 ENCOUNTER — Encounter: Payer: Self-pay | Admitting: Physician Assistant

## 2023-10-04 VITALS — BP 110/70 | HR 76 | Temp 97.3°F | Ht 69.0 in | Wt 137.5 lb

## 2023-10-04 DIAGNOSIS — N39498 Other specified urinary incontinence: Secondary | ICD-10-CM

## 2023-10-04 DIAGNOSIS — J029 Acute pharyngitis, unspecified: Secondary | ICD-10-CM | POA: Diagnosis not present

## 2023-10-04 DIAGNOSIS — F419 Anxiety disorder, unspecified: Secondary | ICD-10-CM

## 2023-10-04 DIAGNOSIS — J449 Chronic obstructive pulmonary disease, unspecified: Secondary | ICD-10-CM

## 2023-10-04 DIAGNOSIS — F32A Depression, unspecified: Secondary | ICD-10-CM | POA: Diagnosis not present

## 2023-10-04 LAB — POCT RAPID STREP A (OFFICE): Rapid Strep A Screen: POSITIVE — AB

## 2023-10-04 MED ORDER — AMOXICILLIN-POT CLAVULANATE 875-125 MG PO TABS: 1.0000 | ORAL_TABLET | Freq: Two times a day (BID) | ORAL | 0 refills | Status: DC

## 2023-10-04 MED ORDER — MIRTAZAPINE 30 MG PO TABS: 30.0000 mg | ORAL_TABLET | Freq: Every day | ORAL | 0 refills | Status: DC

## 2023-10-04 MED ORDER — BUDESONIDE 0.25 MG/2ML IN SUSP: RESPIRATORY_TRACT | 12 refills | Status: DC

## 2023-10-04 MED ORDER — HYDROXYZINE HCL 25 MG PO TABS: 25.0000 mg | ORAL_TABLET | Freq: Four times a day (QID) | ORAL | 0 refills | Status: DC | PRN

## 2023-10-04 MED ORDER — PREDNISONE 10 MG PO TABS: ORAL_TABLET | ORAL | 0 refills | Status: DC

## 2023-10-04 NOTE — Progress Notes (Signed)
 Michael [name] is a 63 y.o. male here for a follow up of a pre-existing problem.  History of Present Illness:   Chief Complaint  Patient presents with   Sore Throat    Pt c/o sore throat couple weeks, blisters back of throat.   Discussed the use of AI scribe software for clinical note transcription with the patient, who gave verbal consent to proceed.  History of Present Illness   Michael [name] is a 63 year old male with chronic obstructive pulmonary disease (COPD) who presents with a positive strep throat test and breathing difficulties.  He has a positive strep throat test and experiences a sensation of choking that has persisted for about a month. Blisters in his throat were observed by his wife. He is here with his mother today. He speaks less and spends more time lying on the couch due to discomfort.  He experiences significant breathing difficulties associated with COPD. Shortness of breath occurs when sitting up, bending over, kneeling, or rolling over in bed. He uses a Stiolto inhaler every morning and occasionally an emergency albuterol  inhaler. He has used his nebulizer three to four times this week without relief. He takes prednisone  10 mg at night, sometimes increasing to 20 mg, without improvement. He has a history of smoking and exposure to cotton dust but has not smoked since his last hospitalization. He uses Mucinex  twice daily to manage mucus production, described as glue.  He experiences urinary incontinence during episodes of breathing difficulties, losing control and urinating on himself when he cannot breathe. He has a history of urinary tract infections and experiences high anxiety during these episodes, feeling unable to move.   He is currently taking hydroxyzine  and mirtazapine , which need refills.   He has attempted to improve lung function through exercise, using a Bowflex machine for 10-15 minutes, which did not exacerbate his breathing issues. He experienced a  small breathing attack this morning after climbing stairs before using his inhaler.       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   Seizure disorder (HCC)    Seizures (HCC)    as a baby   Shortness of breath    Tobacco abuse      Social History   Tobacco Use   Smoking status: Former    Current packs/day: 0.00    Average packs/day: 1 pack/day for 44.0 years (44.0 ttl pk-yrs)    Types: Cigarettes    Quit date: 07/01/2023    Years since quitting: 0.2   Smokeless tobacco: Never   Tobacco comments:    1ppd, verified by [facility] 05/26/2022  Vaping Use   Vaping status: Never Used  Substance Use Topics   Alcohol use: No   Drug use: Never    Past Surgical History:  Procedure Laterality Date  CARDIAC CATHETERIZATION  2002, 2003   normal coronaries   COLONOSCOPY     EYE SURGERY  [age] y.o.   GALLBLADDER SURGERY  2003   HAND SURGERY  1998   x3   OPEN REDUCTION INTERNAL FIXATION (ORIF) DISTAL RADIAL FRACTURE Right 09/07/2016   Procedure: RIGHT WRIST OPEN REDUCTION INTERNAL FIXATION (ORIF) REPAIR AS INDICATED;  Surgeon: Shari Easter, MD;  Location: [facility] OR;  Service: Orthopedics;  Laterality: Right;   PACEMAKER INSERTION  04/23/2003   [employer] Kappa; [facility] - symptomatic bradycardia   PERMANENT PACEMAKER GENERATOR CHANGE N/A 09/06/2012   Procedure: PERMANENT PACEMAKER GENERATOR CHANGE;  Surgeon:  Jerel Balding, MD;  Location: [facility] CATH LAB;  Service: Cardiovascular;  Laterality: N/A;   TONSILLECTOMY     TRANSTHORACIC ECHOCARDIOGRAM  05/14/2012   EF 50-55%, normal systolic function; mild MR (ordered for mitral valve disease 424.0)    Family History  Problem Relation Age of Onset   Heart Problems Mother        MVP   Heart disease Mother    CAD Father    COPD Father    Heart disease Father    Other Sister        crib dealth   Heart attack Maternal Grandmother    Hypertension Maternal Grandmother    Heart attack Paternal Grandmother    Hypertension Paternal Grandmother    Heart attack Paternal Grandfather    Hypertension Paternal Grandfather    Heart Problems Other        aunts x 2 died of heart problems     Allergies  Allergen Reactions   Shellfish Allergy  Anaphylaxis   Amitriptyline     'can't remember what happens'     Current Medications:   Current Outpatient Medications:    albuterol  (ACCUNEB ) 1.25 MG/3ML nebulizer solution, 2.5 mg every 4 hours as needed for cough, shortness of breath and/or wheezing, Disp: 150 mL, Rfl: 5   amoxicillin -clavulanate (AUGMENTIN ) 875-125 MG tablet, Take 1 tablet by mouth 2 (two) times daily., Disp: 20 tablet, Rfl: 0   aspirin  EC 81 MG tablet, Take 81 mg by mouth in the morning and at bedtime. Swallow whole., Disp: , Rfl:    benzonatate  (TESSALON ) 200 MG capsule, Take 1 capsule (200 mg total) by mouth 2 (two) times daily as needed for cough., Disp: 30 capsule, Rfl: 1   desloratadine  (CLARINEX ) 5 MG tablet, Take 1 tablet by mouth once daily, Disp: 90 tablet, Rfl: 0   EPINEPHrine  0.3 mg/0.3 mL IJ SOAJ injection, Inject 0.3 mg into the muscle as needed for anaphylaxis., Disp: 1 each, Rfl: 1   Fexofenadine HCl (MUCINEX  ALLERGY  PO), Take 1 tablet by mouth in the morning and at bedtime., Disp: , Rfl:    fluticasone  (FLONASE ) 50 MCG/ACT nasal spray, Place 2 sprays into both nostrils daily., Disp: 16 g, Rfl: 6   guaiFENesin  (ROBITUSSIN) 100  MG/5ML liquid, Take 5 mLs by mouth every 6 (six) hours., Disp: 120 mL, Rfl: 0   lactose free nutrition (BOOST PLUS) LIQD, Take 237 mLs by mouth 3 (three) times daily with meals., Disp: , Rfl:    omeprazole  (PRILOSEC) 40 MG capsule, Take 1 capsule (40 mg total) by mouth 2 (two) times daily., Disp: 60 capsule, Rfl: 11   ondansetron  (ZOFRAN -ODT) 4 MG disintegrating tablet, Take 1 tablet (4 mg total) by mouth every 8 (eight) hours as needed for nausea or vomiting., Disp: 20 tablet, Rfl: 0   predniSONE  (DELTASONE ) 10 MG tablet, Take 20 mg daily  x 7 days, then reduce to 10 mg daily., Disp: 30 tablet, Rfl: 0   Tiotropium Bromide -Olodaterol (STIOLTO RESPIMAT ) 2.5-2.5 MCG/ACT AERS, Inhale 2 puffs into the lungs daily., Disp: 12 g, Rfl: 3   VENTOLIN  HFA 108 (90 Base) MCG/ACT inhaler, INHALE UP TO  2 PUFFS BY MOUTH EVERY 4 HOURS AS NEEDED, Disp: 54 g, Rfl: 3   budesonide  (PULMICORT ) 0.25 MG/2ML nebulizer solution, One vial with albuterol  nebuizer up to 4 x daily, Disp: 240 mL, Rfl: 12   hydrOXYzine  (ATARAX ) 25 MG tablet, Take 1 tablet (25 mg total) by mouth every 6 (six) hours as needed., Disp: 30 tablet, Rfl: 0   mirtazapine  (REMERON ) 30 MG tablet, Take 1 tablet (30 mg total) by mouth at bedtime., Disp: 90 tablet, Rfl: 0   Review of Systems:   Negative unless otherwise specified per HPI.  Vitals:   Vitals:   10/04/23 1056  BP: 110/70  Pulse: 76  Temp: (!) 97.3 F (36.3 C)  TempSrc: Temporal  SpO2: 98%  Weight: 137 lb 8 oz (62.4 kg)  Height: 5' 9 (1.753 m)     Body mass index is 20.31 kg/m.  Physical Exam:   Physical Exam Vitals and nursing note reviewed.  Constitutional:      General: He is not in acute distress.    Appearance: He is well-developed. He is not ill-appearing or toxic-appearing.  HENT:     Mouth/Throat:     Lips: Pink.     Mouth: Mucous membranes are moist.     Dentition: Abnormal dentition.     Pharynx: Posterior oropharyngeal erythema present.  Cardiovascular:      Rate and Rhythm: Normal rate and regular rhythm.     Pulses: Normal pulses.     Heart sounds: Normal heart sounds, S1 normal and S2 normal.  Pulmonary:     Effort: Pulmonary effort is normal.     Breath sounds: Normal breath sounds.  Skin:    General: Skin is warm and dry.  Neurological:     Mental Status: He is alert.     GCS: GCS eye subscore is 4. GCS verbal subscore is 5. GCS motor subscore is 6.  Psychiatric:        Speech: Speech normal.        Behavior: Behavior normal. Behavior is cooperative.    Results for orders placed or performed in visit on 10/04/23  POCT rapid strep A  Result Value Ref Range   Rapid Strep A Screen Positive (A) Negative    Assessment and Plan:   Assessment and Plan    Chronic obstructive pulmonary disease GOLD III COPD exacerbation with dyspnea, emergency inhaler use, and non-compliance with inhalers and nebulizer. Current regimen ineffective. - Increase prednisone  to 20 mg daily for one week, then reduce to 10 mg daily. - Provide new nebulizer tubing and equipment. - Emphasize consistent use of inhalers and nebulizer to prevent progression to oxygen  dependency. - Follow up with pulmonologist, Dr. Darlean, on December 2nd -- sooner if treatment(s) is ineffective  Sore throat  Acute streptococcal pharyngitis confirmed by positive strep test, contributing to breathing difficulties. - Prescribe antibiotics for streptococcal pharyngitis.  Frequent urinary incontinence  Urinary incontinence episodes linked to dyspnea and anxiety attacks. No red flag symptom(s) or weakness in lower extremities, severe low back pain, saddle anesthesia - Check urine for possible urinary tract infection.  Depression and anxiety Depression and anxiety managed with mirtazapine  and hydroxyzine . Anxiety worsened by dyspnea. - Refill mirtazapine  for depression and  sleep. - Refill hydroxyzine  for anxiety and insomnia.      I personally spent a total of 52 minutes in the  care of the patient today including preparing to see the patient, getting/reviewing separately obtained history, performing a medically appropriate exam/evaluation, counseling and educating, placing orders, and documenting clinical information in the EHR.     Lucie Buttner, PA-C

## 2023-10-04 NOTE — Telephone Encounter (Signed)
 FYI Only or Action Required?: FYI only for provider.  Patient was last seen in primary care on 08/07/2023 by Lucius Krabbe, NP.  Called Nurse Triage reporting Sore Throat.  Symptoms began x 2 weeks.  Interventions attempted: Nothing.  Symptoms are: gradually worsening.  Triage Disposition: See Physician Within 24 Hours  Patient/caregiver understands and will follow disposition?: Yes    Copied from CRM #[id number]. Topic: Clinical - Red Word Triage >> Oct 04, 2023  7:59 AM Timindy P wrote: [facility] that prompted transfer to Nurse Triage: not feeling like himself not eating, cough, sore throat with blisters in his mouth and throat Reason for Disposition  Urinating more frequently than usual (i.e., frequency) OR new-onset of the feeling of an urgent need to urinate (i.e., urgency)  SEVERE throat pain (e.g., excruciating)    Sore throat with painful blisters in mouth  Answer Assessment - Initial Assessment Questions 1. ONSET: When did the throat start hurting? (Hours or days ago)      X 2 weeks 2. SEVERITY: How bad is the sore throat? (Scale 1-10; mild, moderate or severe)     moderate 3. STREP EXPOSURE: Has there been any exposure to strep within the past week? If Yes, ask: What type of contact occurred?      na 4.  VIRAL SYMPTOMS: Are there any symptoms of a cold, such as a runny nose, cough, hoarse voice or red eyes?      cough 5. FEVER: Do you have a fever? If Yes, ask: What is your temperature, how was it measured, and when did it start?     unknown 6. PUS ON THE TONSILS: Is there pus on the tonsils in the back of your throat?     unknown 7. OTHER SYMPTOMS: Do you have any other symptoms? (e.g., difficulty breathing, headache, rash)     SOB at times, blisters in mouth & throat, eating only a little 8. PREGNANCY: Is there any chance you are pregnant? When was your last menstrual period?     na  Answer Assessment - Initial Assessment Questions 1.  SYMPTOM: What's the main symptom you're concerned about? (e.g., , incontinence)     Frequency, urgency 2. ONSET: When did the    start?     X week 3. PAIN: Is there any pain? If Yes, ask: How bad is it? (Scale: 1-10; mild, moderate, severe)     unknown 4. CAUSE: What do you think is causing the symptoms?     unknown 5. OTHER SYMPTOMS: Do you have any other symptoms? (e.g., blood in urine, fever, flank pain, pain with urination)     no 6. PREGNANCY: Is there any chance you are pregnant? When was your last menstrual period?     na  Protocols used: Sore Throat-A-AH, Urinary Symptoms-A-AH

## 2023-10-04 NOTE — Patient Instructions (Signed)
 It was great to see you!  Continue Stiolto 2 puffs daily  Increase prednisone  to 20 mg daily x 1 week, then reduce to 10 mg  Start antibiotic(s) for your strep throat  Take care,  Lucie Buttner PA-C

## 2023-10-04 NOTE — Telephone Encounter (Signed)
 noted

## 2023-10-05 LAB — URINALYSIS, ROUTINE W REFLEX MICROSCOPIC
Bilirubin Urine: NEGATIVE
Hgb urine dipstick: NEGATIVE
Ketones, ur: NEGATIVE
Leukocytes,Ua: NEGATIVE
Nitrite: NEGATIVE
Specific Gravity, Urine: 1.01 (ref 1.000–1.030)
Total Protein, Urine: NEGATIVE
Urine Glucose: NEGATIVE
Urobilinogen, UA: 0.2 (ref 0.0–1.0)
WBC, UA: NONE SEEN — AB (ref 0–?)
pH: 6 (ref 5.0–8.0)

## 2023-10-05 LAB — URINE CULTURE
MICRO NUMBER:: 17042252
Result:: NO GROWTH
SPECIMEN QUALITY:: ADEQUATE

## 2023-10-06 ENCOUNTER — Ambulatory Visit: Payer: Self-pay | Admitting: Physician Assistant

## 2023-11-03 ENCOUNTER — Encounter: Payer: Self-pay | Admitting: Acute Care

## 2023-11-08 ENCOUNTER — Other Ambulatory Visit: Payer: Self-pay | Admitting: Family Medicine

## 2023-11-08 DIAGNOSIS — J441 Chronic obstructive pulmonary disease with (acute) exacerbation: Secondary | ICD-10-CM

## 2023-11-08 NOTE — Telephone Encounter (Unsigned)
 Copied from CRM [id number]. Topic: Clinical - Medication Refill >> Nov 08, 2023 10:40 AM Berneda FALCON wrote: Medication:  -Vitamin D -2 1.5 MG Cap ET 50,000 IU Capsule (did not see on med list) once per week, -desloratadine  (CLARINEX ) 5 MG tablet -benzonatate  (TESSALON ) 200 MG capsule -hydrOXYzine  (ATARAX ) 25 MG tablet (90-days requested)  Has the patient contacted their pharmacy? Yes, they told her to call us  (Agent: If no, request that the patient contact the pharmacy for the refill. If patient does not wish to contact the pharmacy document the reason why and proceed with request.) (Agent: If yes, when and what did the pharmacy advise?)  This is the patient's preferred pharmacy:  [facility] [address], [place] - [address] [place] [address] Phone: [phone] Fax: [phone]  Is this the correct pharmacy for this prescription? Yes If no, delete pharmacy and type the correct one.   Has the prescription been filled recently? No  Is the patient out of the medication? Yes  Has the patient been seen for an appointment in the last year OR does the patient have an upcoming appointment? Yes, but not by PCP, acute appts.  Can we respond through MyChart? No  Agent: Please be advised that Rx refills may take up to 3 business days. We ask that you follow-up with your pharmacy.

## 2023-11-08 NOTE — Telephone Encounter (Signed)
 Vit D not on current med list, unable to pend

## 2023-11-09 MED ORDER — BENZONATATE 200 MG PO CAPS: 200.0000 mg | ORAL_CAPSULE | Freq: Two times a day (BID) | ORAL | 1 refills | Status: DC | PRN

## 2023-11-09 MED ORDER — HYDROXYZINE HCL 25 MG PO TABS: 25.0000 mg | ORAL_TABLET | Freq: Four times a day (QID) | ORAL | 0 refills | Status: DC | PRN

## 2023-11-09 MED ORDER — DESLORATADINE 5 MG PO TABS: 5.0000 mg | ORAL_TABLET | Freq: Every day | ORAL | 0 refills | Status: AC

## 2023-12-05 ENCOUNTER — Encounter: Payer: Self-pay | Admitting: Internal Medicine

## 2023-12-05 ENCOUNTER — Ambulatory Visit: Admitting: Internal Medicine

## 2023-12-05 VITALS — BP 155/79 | HR 74 | Ht 69.0 in | Wt 150.0 lb

## 2023-12-05 DIAGNOSIS — J449 Chronic obstructive pulmonary disease, unspecified: Secondary | ICD-10-CM

## 2023-12-05 MED ORDER — BUDESONIDE 0.25 MG/2ML IN SUSP: RESPIRATORY_TRACT | 12 refills | Status: AC

## 2023-12-05 NOTE — Patient Instructions (Signed)
 Add albuterol  nebulizer up to 4 x daily with budesonide  0.25 as needed to see if you can start weaning prednisone  down)   Continue stioloto x 2 puffs each am   For cough/ congestion >  Mucinex  dm  up to maximum of  1200 mg every 12 hours and use the flutter valve as much as you can   Handicap parking sticker   Please schedule a follow up visit in 3 months but call sooner if needed

## 2023-12-05 NOTE — Assessment & Plan Note (Addendum)
 MM/quit smoking 07/01/23  - reports quit smokng 06/2013> resumed by ov 04/11/2016   - Spirometry 08/19/2013  FEV1 1.85 ( 47%) ratio 44  - Spirometry 12/08/2015   FEV1 1.53 (43%)  Ratio 40 off maint rx  - 12/08/2015  After extensive coaching HFA effectiveness =    75% > start symb 160/atrovent  qid   - Allergy  profile 12/08/15  >  Eos 0.0 /  IgE  347  RAST pos dust/ mold - [employer] AT screen 12/08/15 > MM/ levels ok   - 10/11/2017    changed to spiriva  smi   - 05/26/2022  After extensive coaching inhaler device,  effectiveness =  80% from baseline of 50%with smi and severe cough on insp hfa  > try stiolto 2 each am and pred 20 until better then 10 mg daily  Chest LDSCT     07/06/22    severe centrilobular  and paraseptal emphysema -  06/02/2023  After extensive coaching inhaler device,  effectiveness =    75% (short Ti) and restart prednisone  x 20 until better and 10 mg daily  - 09/01/2023 added flutter valve and alb/bud 0.25 neb up to qid > dd not do either as of 12/05/2023 > try again with plan to taper pred to 20 a/w 10 mg daily    Group E in terms of symptoms/risk so  laba/lama/ICS  therefore appropriate rx at this point >>>  stiolto plus daily prednisone  for now and  more approp SABA prn.         Each maintenance medication was reviewed in detail including emphasizing most importantly the difference between maintenance and prns and under what circumstances the prns are to be triggered using an action plan format where appropriate.  Total time for H and P, chart review, counseling, reviewing hfa/smi/ neb/ flutter  device(s) and generating customized AVS unique to this office visit / same day charting = 32 min

## 2023-12-05 NOTE — Progress Notes (Signed)
 Subjective:     Patient ID: Michael [name], male   DOB: Feb 28, 1960      MRN: [id number]    Brief patient profile:  53  yowm MM/quit smoking 07/01/23  referred to pulmonary clinic 12/08/2015 by Dr  Juli with GOLD III copd documented 08/19/13     History of Present Illness  10/11/2017  f/u ov/Michael [name] re: COPD III/ still smoking maint on symb/ spiriva  dpi  Chief Complaint  Patient presents with   Follow-up    Breathing depends on the heat outside- he is unsure of meds and states needs refills on everything.    Dyspnea:  MMRC3 = can't walk 100 yards even at a slow pace at a flat grade s stopping due to sob   Cough: provoked  with active smoking and ? From dpi spiriva  also/ min mucoid  Sleeping: bed flat with a couple of pillows  SABA use: way too much when weather hot  02: none  rec Change spiriva  to Respimat form  and use 2 pffs first thing in am only to follow the symbicort  x 2 puffs only in the am, then take the other 2 pffs of symbicort  12 hours later  Work on inhaler technique:  The key is to stop smoking completely before smoking completely stops you!  Please schedule a follow up visit in 3 months but call sooner if needed  with all medications    06/02/2023  f/u ov/[place] office/Michael [name] re: GOLD 3  maint on stiolto and prn saba  off prednsione x  a while/ still smoking  Chief Complaint  Patient presents with   COPD  Dyspnea:  100 ft - never pretreating with saba in any form Much better on prednisone   Cough: dry sounding / mucoid min production  Sleeping: propped up on couch  s  resp cc  SABA use: not using abc plan  02: none  Rec Plan A = Automatic = Always=    stiolto 2 puffs each am and prednisone  10 mg 2 daily until better then one daily  Work on inhaler technique: >>>  Remember how golfers warm up by taking practice swings - do this with an empty inhaler  Plan B = Backup (to supplement plan A, not to replace it) Only use your albuterol  inhaler as a rescue medication   Plan C = Crisis (instead of Plan B but only if Plan B stops working) - only use your albuterol  nebulizer if you first try Plan B a Nicotine  7 mg patch      09/01/2023  3 m  f/u ov/[place] office/Michael [name] re: GOLD 3 COPD  maint on stiolto /20 mg prednisone  / no longer smoking as of 07/01/23   Chief Complaint  Patient presents with   COPD    Shob all the time - gasping for air - coughing   Dyspnea:walks the driveway when cool / some inclines also but just doesn't feel better since stopping smoking Cough: less rattly thick mucus min green tinge  Sleeping: propped up on couch    SABA use: rarely hfa  nor neb  02: none   Patient Instructions  Add albuterol  nebulizer up to 4 x daily with budesonide  0.25 as needed to see if you can start weaning prednisone  down)  Continue stioloto For cough/ congestion >  mucinex  dm  up to maximum of  1200 mg every 12 hours and use the flutter valve as much as you can     12/05/2023 25m f/u ov/  office/Michael [name] re:  GOLD 3 COPD maint on stiolto  and pred 10 mg x 2 each am / has not tried taper  Chief Complaint  Patient presents with   COPD    Cxr 9/10 Doe states nothing is helping and doesn't use neb  Coughing with white mucus. Wants 90 days supply on tessalon  and refills on inhalers/neb solution  Dyspnea:  requesting HC for [employer] shopping using cart not the scooter most dasy  Cough: much less rattling min white mucus / not using mucinex  or flutter as rec   Sleeping: still propped up on arm of couch  s resp cc  SABA use: each am x 2 and again in pm  02: none   Lung cancer screening: in program   No obvious day to day or daytime variability or assoc excess/ purulent sputum or mucus plugs or hemoptysis or cp or chest tightness, subjective wheeze or overt hb symptoms.    Also denies any obvious fluctuation of symptoms with weather or environmental changes or other aggravating or alleviating factors except as outlined above   No unusual  exposure hx or h/o childhood pna/ asthma or knowledge of premature birth.  Current Allergies, Complete Past Medical History, Past Surgical History, Family History, and Social History were reviewed in [employer] record.  ROS  The following are not active complaints unless bolded Hoarseness, sore throat, dysphagia, dental problems, itching, sneezing,  nasal congestion or discharge of excess mucus or purulent secretions, ear ache,   fever, chills, sweats, unintended wt loss or wt gain, classically pleuritic or exertional cp,  orthopnea pnd or arm/hand swelling  or leg swelling, presyncope, palpitations, abdominal pain, anorexia, nausea, vomiting, diarrhea  or change in bowel habits or change in bladder habits, change in stools or change in urine, dysuria, hematuria,  rash, arthralgias, visual complaints, headache, numbness, weakness or ataxia or problems with walking or coordination,  change in mood or  memory.        Current Meds  Medication Sig   albuterol  (ACCUNEB ) 1.25 MG/3ML nebulizer solution 2.5 mg every 4 hours as needed for cough, shortness of breath and/or wheezing   aspirin  EC 81 MG tablet Take 81 mg by mouth in the morning and at bedtime. Swallow whole.   benzonatate  (TESSALON ) 200 MG capsule Take 1 capsule (200 mg total) by mouth 2 (two) times daily as needed for cough.   budesonide  (PULMICORT ) 0.25 MG/2ML nebulizer solution One vial with albuterol  nebuizer up to 4 x daily   desloratadine  (CLARINEX ) 5 MG tablet Take 1 tablet (5 mg total) by mouth daily.   EPINEPHrine  0.3 mg/0.3 mL IJ SOAJ injection Inject 0.3 mg into the muscle as needed for anaphylaxis.   Fexofenadine HCl (MUCINEX  ALLERGY  PO) Take 1 tablet by mouth in the morning and at bedtime.   fluticasone  (FLONASE ) 50 MCG/ACT nasal spray Place 2 sprays into both nostrils daily.   guaiFENesin  (ROBITUSSIN) 100 MG/5ML liquid Take 5 mLs by mouth every 6 (six) hours.   hydrOXYzine  (ATARAX ) 25 MG tablet Take 1  tablet (25 mg total) by mouth every 6 (six) hours as needed.   lactose free nutrition (BOOST PLUS) LIQD Take 237 mLs by mouth 3 (three) times daily with meals.   mirtazapine  (REMERON ) 30 MG tablet Take 1 tablet (30 mg total) by mouth at bedtime.   omeprazole  (PRILOSEC) 40 MG capsule Take 1 capsule (40 mg total) by mouth 2 (two) times daily.   ondansetron  (ZOFRAN -ODT) 4 MG disintegrating tablet Take 1  tablet (4 mg total) by mouth every 8 (eight) hours as needed for nausea or vomiting.   predniSONE  (DELTASONE ) 10 MG tablet Take 20 mg daily x 7 days, then reduce to 10 mg daily.   Tiotropium Bromide -Olodaterol (STIOLTO RESPIMAT ) 2.5-2.5 MCG/ACT AERS Inhale 2 puffs into the lungs daily.   VENTOLIN  HFA 108 (90 Base) MCG/ACT inhaler INHALE UP TO  2 PUFFS BY MOUTH EVERY 4 HOURS AS NEEDED                 Objective:   Physical Exam  Wts  12/05/2023      150  09/01/2023      135  06/02/2023      115  12/05/2022      127  08/25/2022      126   05/26/2022      125  10/13/2021       133  04/15/2021         136  10/06/2020         135  10/04/2019         126  02/19/2019         137  01/12/2018       137  10/11/2017       131  07/11/2017         125  01/11/2017         139  07/11/2016         144   04/11/2016        147          12/08/15 157 lb 3.2 oz (71.3 kg)  11/04/15 153 lb 12.8 oz (69.8 kg)  08/25/15 160 lb 12.8 oz (72.9 kg)    Vital signs reviewed  12/05/2023  - Note at rest 02 sats  94% on RA   General appearance:   Amb chronically ill appearing wm nad     HEENT :  Oropharynx  clear   Nasal turbinates nl    NECK :  without JVD/Nodes/TM/ nl carotid upstrokes bilaterally   LUNGS: no acc muscle use,  Mod barrel  contour chest wall with bilateral  Distant bs s audible wheeze and  without cough on insp or exp maneuvers and mod  Hyperresonant  to  percussion bilaterally     CV:  RRR  no s3 or murmur or increase in P2, and no edema   ABD:  soft and nontender with pos mid insp Hoover's  in the supine  position. No bruits or organomegaly appreciated, bowel sounds nl  MS:   Ext warm without deformities or   obvious joint restrictions , calf tenderness, cyanosis or clubbing  SKIN: warm and dry without lesions    NEURO:  alert, approp, nl sensorium with  no motor or cerebellar deficits apparent.        Assessment:       Assessment & Plan COPD GOLD III   MM/quit smoking 07/01/23  - reports quit smokng 06/2013> resumed by ov 04/11/2016   - Spirometry 08/19/2013  FEV1 1.85 ( 47%) ratio 44  - Spirometry 12/08/2015   FEV1 1.53 (43%)  Ratio 40 off maint rx  - 12/08/2015  After extensive coaching HFA effectiveness =    75% > start symb 160/atrovent  qid   - Allergy  profile 12/08/15  >  Eos 0.0 /  IgE  347  RAST pos dust/ mold - [employer] AT screen 12/08/15 > MM/ levels ok   - 10/11/2017    changed  to spiriva  smi   - 05/26/2022  After extensive coaching inhaler device,  effectiveness =  80% from baseline of 50%with smi and severe cough on insp hfa  > try stiolto 2 each am and pred 20 until better then 10 mg daily  Chest LDSCT     07/06/22    severe centrilobular  and paraseptal emphysema -  06/02/2023  After extensive coaching inhaler device,  effectiveness =    75% (short Ti) and restart prednisone  x 20 until better and 10 mg daily  - 09/01/2023 added flutter valve and alb/bud 0.25 neb up to qid > dd not do either as of 12/05/2023 > try again with plan to taper pred to 20 a/w 10 mg daily    Group E in terms of symptoms/risk so  laba/lama/ICS  therefore appropriate rx at this point >>>  stiolto plus daily prednisone  for now and  more approp SABA prn.         Each maintenance medication was reviewed in detail including emphasizing most importantly the difference between maintenance and prns and under what circumstances the prns are to be triggered using an action plan format where appropriate.  Total time for H and P, chart review, counseling, reviewing hfa/smi/ neb/ flutter  device(s) and generating  customized AVS unique to this office visit / same day charting = 32 min            AVS  Patient Instructions  Add albuterol  nebulizer up to 4 x daily with budesonide  0.25 as needed to see if you can start weaning prednisone  down)   Continue stioloto x 2 puffs each am   For cough/ congestion >  Mucinex  dm  up to maximum of  1200 mg every 12 hours and use the flutter valve as much as you can   Handicap parking sticker   Please schedule a follow up visit in 3 months but call sooner if needed    Ozell America, MD 12/05/2023

## 2023-12-06 ENCOUNTER — Ambulatory Visit: Payer: Medicare HMO

## 2023-12-07 ENCOUNTER — Telehealth: Payer: Self-pay | Admitting: Internal Medicine

## 2023-12-07 NOTE — Telephone Encounter (Signed)
 Copied from CRM #[id number]. Topic: General - Other >> Dec 07, 2023  2:01 PM Daniela A wrote: Reason for CRM: pt's mother calling in stating she lost the paperwork for the handicap placard that the pt had requested for handicap parking - she is requesting to have a copy mailed to her or if she can pick it up - call back with update on how to proceed  Ph# [phone]

## 2023-12-08 NOTE — Telephone Encounter (Signed)
 Called and informed shane we have filled out another paper and it is upfront in the office for pick up

## 2023-12-26 ENCOUNTER — Ambulatory Visit (INDEPENDENT_AMBULATORY_CARE_PROVIDER_SITE_OTHER): Admitting: Family Medicine

## 2023-12-26 ENCOUNTER — Encounter: Payer: Self-pay | Admitting: Family Medicine

## 2023-12-26 ENCOUNTER — Ambulatory Visit: Payer: Self-pay

## 2023-12-26 VITALS — BP 120/60 | HR 80 | Temp 97.4°F | Wt 151.5 lb

## 2023-12-26 DIAGNOSIS — J441 Chronic obstructive pulmonary disease with (acute) exacerbation: Secondary | ICD-10-CM | POA: Diagnosis not present

## 2023-12-26 MED ORDER — DOXYCYCLINE HYCLATE 100 MG PO CAPS: 100.0000 mg | ORAL_CAPSULE | Freq: Two times a day (BID) | ORAL | 0 refills | Status: DC

## 2023-12-26 NOTE — Telephone Encounter (Signed)
" °  FYI Only or Action Required?: Action required by provider: request for appointment.  Patient was last seen in primary care on 10/04/2023 by Job Lukes, PA.  Called Nurse Triage reporting Cough.  Symptoms began a week ago.  Interventions attempted: Nothing.  Symptoms are: gradually worsening.Productive cough, worsening. Wheezing, has COPD.  Triage Disposition: See HCP Within 4 Hours (Or PCP Triage)  Patient/caregiver understands and will follow disposition?: Yes      Copied from CRM [id number]. Topic: Clinical - Red Word Triage >> Dec 26, 2023  7:55 AM Deaijah H wrote: Red Word that prompted transfer to Nurse Triage: Coughing w/ yellow/green mucus, and wheezing. Reason for Disposition  [1] MILD difficulty breathing (e.g., minimal/no SOB at rest, SOB with walking, pulse < 100) AND [2] still present when not coughing  Answer Assessment - Initial Assessment Questions 1. ONSET: When did the cough begin?      1 week 2. SEVERITY: How bad is the cough today?      severe 3. SPUTUM: Describe the color of your sputum (e.g., none, dry cough; clear, white, yellow, green)     yellow 4. HEMOPTYSIS: Are you coughing up any blood? If Yes, ask: How much? (e.g., flecks, streaks, tablespoons, etc.)     no 5. DIFFICULTY BREATHING: Are you having difficulty breathing? If Yes, ask: How bad is it? (e.g., mild, moderate, severe)      mild 6. FEVER: Do you have a fever? If Yes, ask: What is your temperature, how was it measured, and when did it start?     no 7. CARDIAC HISTORY: Do you have any history of heart disease? (e.g., heart attack, congestive heart failure)      no 8. LUNG HISTORY: Do you have any history of lung disease?  (e.g., pulmonary embolus, asthma, emphysema)     COPD 9. PE RISK FACTORS: Do you have a history of blood clots? (or: recent major surgery, recent prolonged travel, bedridden)     no 10. OTHER SYMPTOMS: Do you have any other symptoms? (e.g.,  runny nose, wheezing, chest pain)       wheezing 11. PREGNANCY: Is there any chance you are pregnant? When was your last menstrual period?       N/a 12. TRAVEL: Have you traveled out of the country in the last month? (e.g., travel history, exposures)       no  Protocols used: Cough - Acute Productive-A-AH  "

## 2023-12-26 NOTE — Telephone Encounter (Signed)
 Noted

## 2023-12-26 NOTE — Patient Instructions (Signed)
 Start using your nebulizer regularly  Follow up for any fever or increased shortness of breath.

## 2023-12-26 NOTE — Progress Notes (Signed)
 "  Established Patient Office Visit  Subjective   Patient ID: Michael [name], male    DOB: 1960-11-07  Age: 63 y.o. MRN: [id number]  Chief Complaint  Patient presents with   Cough   Nasal Congestion    HPI   [name] is seen today as a work in with over 1 week history of productive cough and some nasal congestion.  He has multiple chronic problems including history of atrial fibrillation, hypertension, COPD, GERD, hypothyroidism.  Apparently quit smoking last June after repeat admission for COPD exacerbation.  Denies fever with current episode.  No recent hemoptysis.  Takes low-dose prednisone  at baseline.  Followed by pulmonary.  Denies any chest pains.  No nausea or vomiting.  He has home nebulizer but states he has not been using that.  Also has budesonide  on his med list but not using that either.  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   GALLBLADDER SURGERY  2003   HAND SURGERY  1998   x3   OPEN REDUCTION INTERNAL FIXATION (ORIF) DISTAL RADIAL FRACTURE Right 09/07/2016   Procedure: RIGHT WRIST OPEN REDUCTION INTERNAL FIXATION (ORIF)  REPAIR AS INDICATED;  Surgeon: Shari Easter, MD;  Location: [facility] OR;  Service: Orthopedics;  Laterality: Right;   PACEMAKER INSERTION  04/23/2003   [employer] Kappa; [facility] - symptomatic bradycardia   PERMANENT PACEMAKER GENERATOR CHANGE N/A 09/06/2012   Procedure: PERMANENT PACEMAKER GENERATOR CHANGE;  Surgeon: Jerel Balding, MD;  Location: [facility] CATH LAB;  Service: Cardiovascular;  Laterality: N/A;   TONSILLECTOMY     TRANSTHORACIC ECHOCARDIOGRAM  05/14/2012   EF 50-55%, normal systolic function; mild MR (ordered for mitral valve disease 424.0)    reports that he quit smoking about 5 months ago. His smoking use included cigarettes. He has a 44 pack-year smoking history. He has never used smokeless tobacco. He reports that he does not drink alcohol and does not use drugs. family history includes CAD in his father; COPD in his father; Heart Problems in his mother and another family member; Heart attack in his maternal grandmother, paternal grandfather, and paternal grandmother; Heart disease in his father and mother; Hypertension in his maternal grandmother, paternal grandfather, and paternal grandmother; Other in his sister. Allergies[1]  Review of Systems  Constitutional:  Negative for chills and fever.  HENT:  Negative for sinus pain and sore throat.   Respiratory:  Positive for cough and sputum production.  Negative for hemoptysis.   Cardiovascular:  Negative for chest pain.      Objective:     BP 120/60   Pulse 80   Temp (!) 97.4 F (36.3 C) (Oral)   Wt 151 lb 8 oz (68.7 kg)   SpO2 94%   BMI 22.37 kg/m  BP Readings from Last 3 Encounters:  12/26/23 120/60  12/05/23 (!) 155/79  10/04/23 110/70   Wt Readings from Last 3 Encounters:  12/26/23 151 lb 8 oz (68.7 kg)  12/05/23 150 lb (68 kg)  10/04/23 137 lb 8 oz (62.4 kg)      Physical Exam Vitals reviewed.  Constitutional:      General: He is not in acute distress.    Appearance: He is not  ill-appearing.  Cardiovascular:     Rate and Rhythm: Normal rate and regular rhythm.  Pulmonary:     Comments: Somewhat diminished breath sounds throughout.  No wheezes or rales noted. Neurological:     Mental Status: He is alert.      No results found for any visits on 12/26/23.    The ASCVD Risk score (Arnett DK, et al., 2019) failed to calculate for the following reasons:   The valid total cholesterol range is 130 to 320 mg/dL    Assessment & Plan:   Acute exacerbation of COPD.  Pulse oximetry 94% room air.  No respiratory distress.  No fever.  No focal lung findings such as rales.  -Start doxycycline  100 mg twice daily for 7 days - Start back more regular use of nebulizer - Continue Stiolto - Consider home pulse oximetry and be in touch if consistent O2 sats less than 90% or if he develops any fever or increasing shortness of breath.  Michael Scarlet, MD     [1]  Allergies Allergen Reactions   Shellfish Allergy  Anaphylaxis   Amitriptyline     'can't remember what happens'    "

## 2024-01-11 ENCOUNTER — Encounter: Payer: Self-pay | Admitting: Cardiovascular Disease

## 2024-01-11 ENCOUNTER — Ambulatory Visit: Attending: Cardiovascular Disease | Admitting: Cardiovascular Disease

## 2024-01-11 VITALS — BP 130/82 | HR 62 | Ht 69.0 in | Wt 153.9 lb

## 2024-01-11 DIAGNOSIS — I495 Sick sinus syndrome: Secondary | ICD-10-CM | POA: Diagnosis not present

## 2024-01-11 DIAGNOSIS — R64 Cachexia: Secondary | ICD-10-CM

## 2024-01-11 DIAGNOSIS — I480 Paroxysmal atrial fibrillation: Secondary | ICD-10-CM

## 2024-01-11 DIAGNOSIS — I700 Atherosclerosis of aorta: Secondary | ICD-10-CM

## 2024-01-11 DIAGNOSIS — R0602 Shortness of breath: Secondary | ICD-10-CM

## 2024-01-11 DIAGNOSIS — I4719 Other supraventricular tachycardia: Secondary | ICD-10-CM | POA: Diagnosis not present

## 2024-01-11 DIAGNOSIS — J449 Chronic obstructive pulmonary disease, unspecified: Secondary | ICD-10-CM

## 2024-01-11 DIAGNOSIS — R9439 Abnormal result of other cardiovascular function study: Secondary | ICD-10-CM | POA: Diagnosis not present

## 2024-01-11 NOTE — Progress Notes (Signed)
 " Cardiology Office Note:    Date:  01/11/2024   ID:  Michael [name], DOB 1960/03/14, MRN [id number]  PCP:  Kennyth Worth HERO, MD  [facility] Cardiologist:  Jerel Balding, MD  [facility] Electrophysiologist:  None   Referring MD: Kennyth Worth HERO, MD   CC: Pacemaker follow up  History of Present Illness:    Michael [name] is a 64 y.o. male with a hx of sinus node dysfunction s/p pacemaker, atrial fibrillation, tobacco use disorder who presents for follow up of pacemaker.  His breathing is extremely poor.  He can only walk a few steps without being out of breath.  He has a chronic cough but only a little wheezing.  He has received several courses of prednisone  over the last year.  He is still on 10 mg daily.  He is on Stiolto and Symbicort  maintenance therapy as well as albuterol  as a rescue inhaler.  He is seeing Dr. Darlean in the pulmonary clinic in [place].  He refuses to consider portable oxygen  as a treatment since he does not want to become dependent on it.  He wants to have a procedure where his lungs can be washed out to get rid of all the junk.  He quit smoking in June but he has smoked 2 packs a day since teenage years.  He also believes he has a hiatal hernia that is pressing on his lungs and worsening his breathing.  He states that he becomes short of breath just when he is eating.  He has not had any chest pain but does have abdominal tightness after eating.  He does not have orthopnea, PND, lower extremity edema.  He is not aware of any palpitations.  He has not had dizziness or syncope.  He has not had an echocardiogram since 2014 when he had normal left ventricular function.  He had a nuclear myocardial perfusion study in 2018 which was a low risk test with the possible small amount of apical ischemia and EF 49% with diffuse hypokinesis.  On his chest CT from 05/11/2023 there was evidence of atherosclerosis in the aortic arch and ascending thoracic aorta, but there was no visible  atherosclerotic calcification in the distribution of the coronary arteries (this was not a gated study).  The ascending aorta is borderline dilated at 4 cm, which is an unchanged finding when compared with July 2024  The chest CT does show very severe bullous emphysema.  In particular on the right upper lobe there is a huge bulla, but both lungs are severely damaged.  A nuclear gastric emptying study performed in January 2025 and an esophagram with contrast performed in August 2024 were normal studies.  There is no mention of a hiatal hernia and there was no evidence of gastroesophageal reflux.  Reviewed the CT of his abdomen pelvis performed 08/15/2022.  There is no official report yet.  He has some relatively scanty atherosclerosis in the abdominal aorta, particularly in the inferior portion and the iliac branches, but he does not have any evidence of plaque or obstruction to the celiac artery or superior mesenteric artery.  His stomach is very full of fluid content.  Raises a concern about pyloric pathology such as ulcer or neoplasm causing gastric outlet obstruction.  Pending official interpretation.  Pacemaker function is normal.  He is not pacemaker dependent.  Presenting rhythm is normal sinus rhythm (atrial sensed, ventricular sensed).  Estimated battery longevity is 4 years.  Both atrial and ventricular leads have  excellent parameters.  He has roughly 24% atrial pacing and only 0.8% ventricular pacing.  He has had multiple episodes of paroxysmal atrial tachycardia often with 1: 1 AV conduction and high ventricular rates but these are always brief, usually just a few seconds.  He has had a handful lasting more than a minute and the longest 1 was only 6 seconds in duration.  He did have a single episode of paroxysmal atrial fibrillation lasting for about 15 minutes that occurred 01/17/2023.  All the episodes of high ventricular rates for which there are available electrograms are actually atrial  tachycardia with 1: 1 AV conduction.  The overall burden of atrial arrhythmia is less than 0.1% the heart rate histogram distribution is normal.    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   Seizure disorder (HCC)    Seizures (HCC)    as a baby   Shortness of breath    Tobacco abuse     Past Surgical History:  Procedure Laterality Date   CARDIAC CATHETERIZATION  2002, 2003   normal coronaries   COLONOSCOPY     EYE SURGERY  [age] y.o.   GALLBLADDER SURGERY  2003   HAND SURGERY  1998   x3   OPEN REDUCTION INTERNAL FIXATION (ORIF) DISTAL RADIAL FRACTURE Right 09/07/2016   Procedure: RIGHT WRIST OPEN REDUCTION INTERNAL FIXATION (ORIF) REPAIR AS INDICATED;  Surgeon: Shari Easter, MD;  Location: [facility] OR;  Service: Orthopedics;  Laterality: Right;   PACEMAKER INSERTION  04/23/2003   [employer] Kappa; [facility] - symptomatic bradycardia   PERMANENT PACEMAKER GENERATOR CHANGE N/A 09/06/2012   Procedure: PERMANENT PACEMAKER GENERATOR CHANGE;  Surgeon: Jerel Balding, MD;  Location: [facility] CATH LAB;  Service: Cardiovascular;  Laterality: N/A;   TONSILLECTOMY     TRANSTHORACIC ECHOCARDIOGRAM  05/14/2012   EF 50-55%, normal systolic function; mild MR (ordered for mitral valve disease 424.0)    Current Medications: No outpatient medications have been marked as taking for the 01/11/24 encounter (Office Visit) with  Kealey Kemmer, Jerel, MD.     Allergies:   Shellfish allergy  and Amitriptyline   Social History   Socioeconomic History   Marital status: Divorced    Spouse name: Not on file   Number of children: 2   Years of education: 12   Highest education level: Not on file  Occupational History   Occupation: Disabled  Tobacco Use   Smoking status: Former    Current packs/day: 0.00    Average packs/day: 1 pack/day for 44.0 years (44.0 ttl pk-yrs)    Types: Cigarettes    Quit date: 07/01/2023    Years since quitting: 0.5   Smokeless tobacco: Never   Tobacco comments:    1ppd, verified by [facility] 05/26/2022  Vaping Use   Vaping status: Never Used  Substance and Sexual  Activity   Alcohol use: No   Drug use: Never   Sexual activity: Not Currently  Other Topics Concern   Not on file  Social History Narrative   Not on file   Social Drivers of Health   Tobacco Use: Medium Risk (01/11/2024)   Patient History    Smoking Tobacco Use: Former    Smokeless Tobacco Use: Never    Passive Exposure: Not on file  Financial Resource Strain: Low Risk (04/17/2023)   Overall Financial Resource Strain (CARDIA)    Difficulty of Paying Living Expenses: Not hard at all  Food Insecurity: No Food Insecurity (07/02/2023)   Epic    Worried About Radiation Protection Practitioner of Food in the Last Year: Never true    Ran Out of Food in the Last Year: Never true  Transportation Needs: No Transportation Needs (07/02/2023)   Epic    Lack of Transportation (Medical): No    Lack of Transportation (Non-Medical): No  Physical Activity: Inactive (04/17/2023)   Exercise Vital Sign    Days of Exercise per Week: 0 days    Minutes of Exercise per Session: 0 min  Stress: No Stress Concern Present (04/17/2023)   [employer] of Occupational Health - Occupational Stress Questionnaire    Feeling of Stress : Only a little  Social Connections: Socially Isolated (04/17/2023)   Social Connection and Isolation Panel    Frequency of Communication with  Friends and Family: More than three times a week    Frequency of Social Gatherings with Friends and Family: More than three times a week    Attends Religious Services: Never    Database Administrator or Organizations: No    Attends Banker Meetings: Never    Marital Status: Divorced  Depression (PHQ2-9): Low Risk (08/07/2023)   Depression (PHQ2-9)    PHQ-2 Score: 0  Alcohol Screen: Low Risk (04/17/2023)   Alcohol Screen    Last Alcohol Screening Score (AUDIT): 0  Housing: Low Risk (07/02/2023)   Epic    Unable to Pay for Housing in the Last Year: No    Number of Times Moved in the Last Year: 0    Homeless in the Last Year: No  Utilities: Not At Risk (07/02/2023)   Epic    Threatened with loss of utilities: No  Health Literacy: Adequate Health Literacy (04/17/2023)   B1300 Health Literacy    Frequency of need for help with medical instructions: Never     Family History: The patient's *family history includes CAD in his father; COPD in his father; Heart Problems in his mother and another family member; Heart attack in his maternal grandmother, paternal grandfather, and paternal grandmother; Heart disease in his father and mother; Hypertension in his maternal grandmother, paternal grandfather, and paternal grandmother; Other in his sister.  ROS:   Please see the history of present illness.    All other systems reviewed and are negative.  EKGs/Labs/Other Studies Reviewed:    EKG: Ordered today shows atrial paced, ventricular sensed rhythm with early repolarization but otherwise a normal tracing.  Recent Labs: Last lab work in 2019 showed creatinine of .78, potassium of 3.8, hemoglobin of 12.2.   Recent Lipid Panel    Component Value Date/Time   CHOL 127 10/04/2022 1333   TRIG 82.0 10/04/2022 1333   HDL 43.70 10/04/2022 1333   CHOLHDL 3 10/04/2022 1333   VLDL 16.4 10/04/2022 1333   LDLCALC 67 10/04/2022 1333   Physical Exam:    VS:  BP  130/82 (BP Location: Left  Arm, Patient Position: Sitting, Cuff Size: Normal)   Pulse 62   Ht 5' 9 (1.753 m)   Wt 153 lb 14.4 oz (69.8 kg)   SpO2 98%   BMI 22.73 kg/m     Wt Readings from Last 3 Encounters:  01/11/24 153 lb 14.4 oz (69.8 kg)  12/26/23 151 lb 8 oz (68.7 kg)  12/05/23 150 lb (68 kg)     General: Alert, oriented x3, no distress, healthy left subclavian pacemaker site Head: no evidence of trauma, PERRL, EOMI, no exophtalmos or lid lag, no myxedema, no xanthelasma; normal ears, nose and oropharynx Neck: normal jugular venous pulsations and no hepatojugular reflux; brisk carotid pulses without delay and no carotid bruits Chest: Severely diminished breath sounds throughout, very scanty and distant wheezes and a couple of rhonchi Cardiovascular: normal position and quality of the apical impulse, regular rhythm, normal first and second heart sounds, no murmurs, rubs or gallops Abdomen: no tenderness or distention, no masses by palpation, no abnormal pulsatility or arterial bruits, normal bowel sounds, no hepatosplenomegaly Extremities: no clubbing, cyanosis or edema; 2+ radial, ulnar and brachial pulses bilaterally; 2+ right femoral, posterior tibial and dorsalis pedis pulses; 2+ left femoral, posterior tibial and dorsalis pedis pulses; no subclavian or femoral bruits Neurological: grossly nonfocal Psych: Normal mood and affect     ASSESSMENT:    1. Sinus node dysfunction (HCC)   2. Paroxysmal atrial fibrillation (HCC)   3. PAT (paroxysmal atrial tachycardia)   4. COPD GOLD III    5. Cachexia   6. SSS (sick sinus syndrome) (HCC)   7. SOB (shortness of breath)    PLAN:    In order of problems listed above:  Sinus Node Dysfunction: Heart rate histogram distribution is appropriate with current sensor settings.  No symptoms of chronotropic incompetence.  His shortness of breath is likely due to lung disease. Atrial Fibrillation: The episodes remain brief and infrequent, no true atrial  fibrillation since a year ago when he had an episode lasting about 50 minutes.  Has brief bursts of paroxysmal atrial tachycardia with high ventricular rates, overall burden of arrhythmia is very low.  No history of embolic events, [id number] score is 1 (HTN only, even this has resolved).  He does not require anticoagulation and I do not think he would benefit from antiarrhythmics either. PAT: There is occasional palpitations, but otherwise his episodes are asymptomatic.  The driver for his atrial tachycardia is almost certainly his severe lung disease. COPD: Still smoking.  Seeing Dr. Darlean.  Has very severe bullous emphysema.  Not sure that surgery is an option to reduce the compression from the bullae.  Encouraged him to consider oxygen  as a treatment that will improve quality of life, not a something that causes dependency.   History of abnormal nuclear stress test: Will check an echocardiogram to see if there is any evidence of regional wall motion abnormalities or depressed LV function.  There is no evidence of severe atherosclerosis in the coronary arteries, although he is clearly at risk for having progression of CAD.  He would not be a candidate for surgical revascularization due to his advanced lung disease and I do not think is a compelling reason to proceed to cardiac catheterization at this time Aortic atherosclerosis/borderline dilation of ascending aorta.  Most recent lipid profile is not bad with an LDL of 67 and HDL of 44.  He does not have diabetes mellitus. History of hypertension: Normal blood pressure without medications  after he lost a lot of weight. Abdominal pain: He continues to complain of pain after eating, but radiological studies of his esophagus and stomach have not identified a serious problem.  He thinks his symptoms are related to a hiatal hernia but I do not see any meaningful abnormality of that nature.  Medication Adjustments/Labs and Tests Ordered: Current medicines are  reviewed at length with the patient today.  Concerns regarding medicines are outlined above.  Orders Placed This Encounter  Procedures   EKG 12-Lead   EKG 12-Lead   ECHOCARDIOGRAM COMPLETE   No orders of the defined types were placed in this encounter.   Patient Instructions  Medication Instructions:  Your physician recommends that you continue on your current medications as directed. Please refer to the Current Medication list given to you today.  *If you need a refill on your cardiac medications before your next appointment, please call your pharmacy*   Testing/Procedures: Your physician has requested that you have an echocardiogram. Echocardiography is a painless test that uses sound waves to create images of your heart. It provides your doctor with information about the size and shape of your heart and how well your hearts chambers and valves are working. This procedure takes approximately one hour. There are no restrictions for this procedure. Please do NOT wear cologne, perfume, aftershave, or lotions (deodorant is allowed). Please arrive 15 minutes prior to your appointment time.  Please note: We ask at that you not bring children with you during ultrasound (echo/ vascular) testing. Due to room size and safety concerns, children are not allowed in the ultrasound rooms during exams. Our front office staff cannot provide observation of children in our lobby area while testing is being conducted. An adult accompanying a patient to their appointment will only be allowed in the ultrasound room at the discretion of the ultrasound technician under special circumstances. We apologize for any inconvenience.   Follow-Up: At [facility], you and your health needs are our priority.  As part of our continuing mission to provide you with exceptional heart care, our providers are all part of one team.  This team includes your primary Cardiologist (physician) and Advanced Practice  Providers or APPs (Physician Assistants and Nurse Practitioners) who all work together to provide you with the care you need, when you need it.  Your next appointment:   1 year(s)  Provider:   Jerel Balding, MD      "

## 2024-01-11 NOTE — Patient Instructions (Addendum)
 Medication Instructions:  Your physician recommends that you continue on your current medications as directed. Please refer to the Current Medication list given to you today.  *If you need a refill on your cardiac medications before your next appointment, please call your pharmacy*  Testing/Procedures: Your physician has requested that you have an echocardiogram. Echocardiography is a painless test that uses sound waves to create images of your heart. It provides your doctor with information about the size and shape of your heart and how well your hearts chambers and valves are working. This procedure takes approximately one hour. There are no restrictions for this procedure. Please do NOT wear cologne, perfume, aftershave, or lotions (deodorant is allowed). Please arrive 15 minutes prior to your appointment time.  Please note: We ask at that you not bring children with you during ultrasound (echo/ vascular) testing. Due to room size and safety concerns, children are not allowed in the ultrasound rooms during exams. Our front office staff cannot provide observation of children in our lobby area while testing is being conducted. An adult accompanying a patient to their appointment will only be allowed in the ultrasound room at the discretion of the ultrasound technician under special circumstances. We apologize for any inconvenience.   Follow-Up: At [facility], you and your health needs are our priority.  As part of our continuing mission to provide you with exceptional heart care, our providers are all part of one team.  This team includes your primary Cardiologist (physician) and Advanced Practice Providers or APPs (Physician Assistants and Nurse Practitioners) who all work together to provide you with the care you need, when you need it.  Your next appointment:   1 year(s)  Provider:   Jerel Balding, MD

## 2024-01-19 ENCOUNTER — Ambulatory Visit (INDEPENDENT_AMBULATORY_CARE_PROVIDER_SITE_OTHER): Admitting: Family Medicine

## 2024-01-19 ENCOUNTER — Ambulatory Visit: Payer: Self-pay

## 2024-01-19 ENCOUNTER — Encounter: Payer: Self-pay | Admitting: Family Medicine

## 2024-01-19 VITALS — BP 138/84 | HR 56 | Ht 69.0 in | Wt 153.0 lb

## 2024-01-19 DIAGNOSIS — J441 Chronic obstructive pulmonary disease with (acute) exacerbation: Secondary | ICD-10-CM | POA: Diagnosis not present

## 2024-01-19 DIAGNOSIS — F419 Anxiety disorder, unspecified: Secondary | ICD-10-CM

## 2024-01-19 MED ORDER — HYDROXYZINE HCL 25 MG PO TABS: 25.0000 mg | ORAL_TABLET | Freq: Four times a day (QID) | ORAL | 0 refills | Status: DC | PRN

## 2024-01-19 MED ORDER — MIRTAZAPINE 30 MG PO TABS: 30.0000 mg | ORAL_TABLET | Freq: Every day | ORAL | 0 refills | Status: DC

## 2024-01-19 MED ORDER — BENZONATATE 200 MG PO CAPS: 200.0000 mg | ORAL_CAPSULE | Freq: Two times a day (BID) | ORAL | 0 refills | Status: AC | PRN

## 2024-01-19 NOTE — Progress Notes (Signed)
" ° °  Subjective:    Patient ID: Michael [name], male    DOB: 1960/12/03, 64 y.o.   MRN: [id number]  HPI 'i need my medicine refilled'- pt reports he has been w/o medication which has worsened his anxiety.  Family reports pt does well when taking medication.  Pt is very angry that he has not had refills 'in months' and has not been able to get an appt.  Needs Mirtazapine , Hydroxyzine , Benzonatate .  He was initially requesting other medications but those are not filled by PCP.   Review of Systems For ROS see HPI     Objective:   Physical Exam Vitals reviewed.  Constitutional:      Comments: agitated  HENT:     Head: Normocephalic and atraumatic.  Skin:    General: Skin is warm and dry.  Psychiatric:     Comments: Frustrated, anxious, angry           Assessment & Plan:    "

## 2024-01-19 NOTE — Patient Instructions (Addendum)
 Follow up in 2-3 weeks w/ Dr Kennyth We sent your medications to [facility] and they should be available later today Call with any questions or concerns Hang in there! Have a great weekend!

## 2024-01-19 NOTE — Telephone Encounter (Signed)
 This needs to be seen here. Not appropriate for E2C2 to schedule with another provider or office. Will forward to [facility]

## 2024-01-19 NOTE — Telephone Encounter (Signed)
 Received call from patient's mother Erminio asking if nurse received all the info that was needed before call was disconnected. Confirmed triage encounter in chart is completed, patient has appointment scheduled today. No further needs.

## 2024-01-19 NOTE — Telephone Encounter (Signed)
 Appt today

## 2024-01-19 NOTE — Assessment & Plan Note (Signed)
 Refilled pt's hydroxyzine  and mirtazapine .  Stressed the need for routine follow up w/ PCP as he has seen multiple other providers that have filled this medication in the recent past.  He got very angry and said that he's not able to get appts w/ his PCP.  Also angry that people are only filling it for 30 days and not 90 when they are the same price.  Explained to him that typically only his regular physician will fill 90 days as the rest of us  are just filling in and a 15-30 day supply is usually enough of a stop gap until a regularly scheduled appt.  I did offer to fill 90 days today b/c he was so upset but ensured that he had an appt scheduled w/ PCP in the next few weeks.

## 2024-01-19 NOTE — Telephone Encounter (Signed)
 This patient was schedule in an acute slot at the end of Dr Mahlon day by [facility]. This patient needs to be moved to PCP office. Could someone please reach out to this patient?

## 2024-01-19 NOTE — Telephone Encounter (Signed)
 FYI Only or Action Required?: FYI only for provider: appointment scheduled on 01/19/24.  Patient was last seen in primary care on 12/26/2023 by Micheal Wolm ORN, MD.  Called Nurse Triage reporting Anxiety.  Symptoms began several days ago.  Interventions attempted: Nothing.  Symptoms are: unchanged.  Triage Disposition: See Physician Within 24 Hours  Patient/caregiver understands and will follow disposition?: Yes Reason for Disposition  Patient sounds very upset or troubled to the triager  Answer Assessment - Initial Assessment Questions No available appts with pcp/clinic, scheduled 01/19/24 alt prov.  Advised call back or ED/911 if symptoms worsen; suicidal/homicidal ideation, overwhelmed with symptoms. Patient verbalized understanding.  Pt's mom declines BH resources; reports want go.    1. CONCERN: Did anything happen that prompted you to call today?      Atarax , Remeron , Tessalon  Won't use neb tx, but uses inhaler; sob with exertion If he has med he would be much better, has been out, been off for weeks; for anxiety, depression 2. ANXIETY SYMPTOMS: Can you describe how you (your loved one; patient) have been feeling? (e.g., tense, restless, panicky, anxious, keyed up, overwhelmed, sense of impending doom).      Need refills, been off for several weeks 3. ONSET: How long have you been feeling this way? (e.g., hours, days, weeks)     Weeks ago 4. SEVERITY: How would you rate the level of anxiety? (e.g., 0 - 10; or mild, moderate, severe).     moderate 5. FUNCTIONAL IMPAIRMENT: How have these feelings affected your ability to do daily activities? Have you had more difficulty than usual doing your normal daily activities? (e.g., getting better, same, worse; self-care, school, work, interactions)     Still getting up showering, but don't eat much 6. HISTORY: Have you felt this way before? Have you ever been diagnosed with an anxiety problem in the past? (e.g.,  generalized anxiety disorder, panic attacks, PTSD). If Yes, ask: How was this problem treated? (e.g., medicines, counseling, etc.)     Anxiety,  7. RISK OF HARM - SUICIDAL IDEATION: Do you ever have thoughts of hurting or killing yourself? If Yes, ask:  Do you have these feelings now? Do you have a plan on how you would do this?     no 8. TREATMENT:  What has been done so far to treat this anxiety? (e.g., medicines, relaxation strategies). What has helped?     Need refill 9. THERAPIST: Do you have a counselor or therapist? If Yes, ask: What is their name?     no 10. POTENTIAL TRIGGERS: Do you drink caffeinated beverages (e.g., coffee, colas, teas), and how much daily? Do you drink alcohol or use any drugs? Have you started any new medicines recently?       no 11. PATIENT SUPPORT: Who is with you now? Who do you live with? Do you have family or friends who you can talk to?        Mother, cousins 27. OTHER SYMPTOMS: Do you have any other symptoms? (e.g., feeling depressed, trouble concentrating, trouble sleeping, trouble breathing, palpitations or fast heartbeat, chest pain, sweating, nausea, or diarrhea)    Denies diff breathing, chest pain, faint  Protocols used: Anxiety and Panic Attack-A-AH

## 2024-01-19 NOTE — Telephone Encounter (Signed)
 Pt was not called & came in today to see Dr Mahlon. Pt states that he was here for refills because he was out of a lot of his medication. Pt was given limited refills & scheduled to see PCP for medication management.

## 2024-01-22 NOTE — Telephone Encounter (Signed)
 noted

## 2024-02-02 ENCOUNTER — Ambulatory Visit: Admitting: Family Medicine

## 2024-02-02 VITALS — BP 164/92 | HR 82 | Temp 97.3°F | Wt 156.0 lb

## 2024-02-02 DIAGNOSIS — F32A Depression, unspecified: Secondary | ICD-10-CM | POA: Diagnosis not present

## 2024-02-02 DIAGNOSIS — I159 Secondary hypertension, unspecified: Secondary | ICD-10-CM | POA: Diagnosis not present

## 2024-02-02 DIAGNOSIS — F419 Anxiety disorder, unspecified: Secondary | ICD-10-CM | POA: Diagnosis not present

## 2024-02-02 DIAGNOSIS — K219 Gastro-esophageal reflux disease without esophagitis: Secondary | ICD-10-CM

## 2024-02-02 DIAGNOSIS — J449 Chronic obstructive pulmonary disease, unspecified: Secondary | ICD-10-CM | POA: Diagnosis not present

## 2024-02-02 DIAGNOSIS — R634 Abnormal weight loss: Secondary | ICD-10-CM

## 2024-02-02 MED ORDER — HYDROXYZINE HCL 25 MG PO TABS: 25.0000 mg | ORAL_TABLET | Freq: Four times a day (QID) | ORAL | 5 refills | Status: AC | PRN

## 2024-02-02 MED ORDER — MIRTAZAPINE 30 MG PO TABS: 30.0000 mg | ORAL_TABLET | Freq: Every day | ORAL | 3 refills | Status: AC

## 2024-02-02 MED ORDER — BUSPIRONE HCL 5 MG PO TABS: 5.0000 mg | ORAL_TABLET | Freq: Two times a day (BID) | ORAL | 5 refills | Status: AC

## 2024-02-02 MED ORDER — AZITHROMYCIN 250 MG PO TABS: ORAL_TABLET | ORAL | 0 refills | Status: AC

## 2024-02-02 NOTE — Assessment & Plan Note (Addendum)
 Follows with GI.  He has a hiatal hernia and he is concerned that this is contributing to his dyspnea.  He is currently on Nexium 40 mg daily.  We did discuss adjusting medications however he declined for today.

## 2024-02-02 NOTE — Patient Instructions (Signed)
 It was very nice to see you today!  VISIT SUMMARY: During your visit, we addressed your severe shortness of breath, heartburn, anxiety, and other related health issues. We discussed your current treatments and made adjustments to help manage your symptoms more effectively.  YOUR PLAN: COPD WITH ACUTE EXACERBATION: You are experiencing severe shortness of breath and mobility limitations. -Continue using your nebulizer as prescribed. -Start taking an antibiotic to treat a potential lung infection. -Use your inhalers consistently and discuss inhaler options with a pulmonologist.  GASTROESOPHAGEAL REFLUX DISEASE: You have intermittent heartburn and reflux despite medication. -Continue taking omeprazole  and monitor your symptoms. -Adjust treatment if necessary.  ANXIETY DISORDER: Your anxiety has increased due to breathing difficulties. -Start taking buspirone  twice daily. -Continue taking Remeron  and hydroxyzine  as needed. -Monitor your anxiety symptoms and adjust treatment if necessary.  DEPRESSION: Your anxiety may be impacting your depressive symptoms. -Focus on managing your anxiety. -Monitor your mood and depressive symptoms. -Consider reintroducing antidepressant therapy if symptoms persist.  SECONDARY HYPERTENSION: You experience blood pressure fluctuations during episodes of shortness of breath and anxiety. -Monitor your blood pressure regularly. -Adjust your antihypertensive therapy as needed.  ABNORMAL WEIGHT LOSS: Recent weight gain indicates improvement. -Continue monitoring your weight.  ACUTE OTITIS MEDIA WITH EFFUSION: Fluid buildup in your ears is causing congestion and discomfort. -Start taking an antibiotic for the ear infection and fluid buildup. -Monitor your ear symptoms and adjust treatment if necessary.  Return in about 2 weeks (around 02/16/2024).   Take care, Dr Kennyth  PLEASE NOTE:  If you had any lab tests, please let us  know if you have not heard back  within a few days. You may see your results on mychart before we have a chance to review them but we will give you a call once they are reviewed by us .   If we ordered any referrals today, please let us  know if you have not heard from their office within the next week.   If you had any urgent prescriptions sent in today, please check with the pharmacy within an hour of our visit to make sure the prescription was transmitted appropriately.   Please try these tips to maintain a healthy lifestyle:  Eat at least 3 REAL meals and 1-2 snacks per day.  Aim for no more than 5 hours between eating.  If you eat breakfast, please do so within one hour of getting up.   Each meal should contain half fruits/vegetables, one quarter protein, and one quarter carbs (no bigger than a computer mouse)  Cut down on sweet beverages. This includes juice, soda, and sweet tea.   Drink at least 1 glass of water with each meal and aim for at least 8 glasses per day  Exercise at least 150 minutes every week.

## 2024-02-02 NOTE — Assessment & Plan Note (Signed)
 Had a lot of anxiety recently mostly attributed to his difficultHe has he was dyspnea and COPD.  He was previously on Prozac  however this was discontinued several months ago after being started on Remeron  to help with insomnia and appetite.  He is also on hydroxyzine  as needed.  Will refill both the Remeron  and hydroxyzine  today though we did discuss additional treatment options to help with anxiety as this is probably contributing to his dyspnea as well.  Will start BuSpar  5 mg twice daily.  They will follow-up with us  in a couple weeks and we can adjust as needed.

## 2024-02-02 NOTE — Assessment & Plan Note (Signed)
 Patient is up about 30 pounds since her last visit.  He is doing well this now.

## 2024-02-02 NOTE — Progress Notes (Signed)
 "  Michael [name] is a 64 y.o. male who presents today for an office visit.  Assessment/Plan:  New/Acute Problems: Sinusitis  With cough and congestion for the last week or so.  Consistent with previous episodes of sinusitis.  Start azithromycin  which should hopefully help some with his respiratory symptoms as below as well.  He will let us  know if not proving.  Chronic Problems Addressed Today: COPD (chronic obstructive pulmonary disease) (HCC) I do lengthy discussion with the patient and family today regarding his COPD.  He still has severe dyspnea on exertion.  He is concerned about his hiatal hernia causing worsening symptoms and would like to have a bronchoscopy to help with his symptoms.  He has had some difficulty with staying consistent with nebulizer that we did discuss importance of staying consistent with prescribed medication.  He will follow-up with pulmonology in a few weeks.  Weight loss Patient is up about 30 pounds since her last visit.  He is doing well this now.  Anxiety Had a lot of anxiety recently mostly attributed to his difficultHe has he was dyspnea and COPD.  He was previously on Prozac  however this was discontinued several months ago after being started on Remeron  to help with insomnia and appetite.  He is also on hydroxyzine  as needed.  Will refill both the Remeron  and hydroxyzine  today though we did discuss additional treatment options to help with anxiety as this is probably contributing to his dyspnea as well.  Will start BuSpar  5 mg twice daily.  They will follow-up with us  in a couple weeks and we can adjust as needed.  GERD (gastroesophageal reflux disease) Follows with GI.  He has a hiatal hernia and he is concerned that this is contributing to his dyspnea.  He is currently on Nexium 40 mg daily.  We did discuss adjusting medications however he declined for today.  Depression See anxiety A/P.  No longer on Prozac .  Depressive symptoms are manageable on current  regimen of Remeron  and hydroxyzine  as above.  We are adding BuSpar  as above to help with his anxiety.  HTN (hypertension) Elevated today.  Likely due to his anxiety.  Previously well-controlled.  They will monitor at home and come back here in 2 weeks to recheck.     Subjective:  HPI:  See assessment / plan for status of chronic conditions.   Discussed the use of AI scribe software for clinical note transcription with the patient, who gave verbal consent to proceed.  History of Present Illness Michael [name] is a 64 year old male with COPD who presents with severe shortness of breath and difficulty breathing.  He experiences severe shortness of breath that limits his ability to perform daily activities such as climbing stairs, walking to the bathroom, or standing for more than a minute. He feels unable to breathe when bending over, standing, or when water runs over his head. He describes a sensation of choking and difficulty swallowing. He uses a nebulizer but feels it is ineffective and expresses concerns about its effects. He has quit smoking but has not noticed significant improvement in his symptoms.  He experiences occasional heartburn and reflux, even with medication. Tightening his belt or fastening his pants exacerbates his breathing difficulties. He has a history of a small hiatal hernia, which he believes contributes to his swallowing and breathing issues. He was previously prescribed omeprazole  but reports nausea if taken without food.  He has a history of anxiety, which worsens during  episodes of breathing difficulties. He was previously on Prozac  but discontinued due to interactions with other medications. His anxiety contributes to his difficulty breathing and causes his blood pressure to rise during episodes.  He has a family history of respiratory issues; his father had severe breathing problems and died from complications related to steroid use. He has a history of  smoking and occupational exposure to dust, which he believes has contributed to his current lung condition. He uses inhalers, including a rescue inhaler, but tries to avoid frequent use.         Objective:  Physical Exam: BP (!) 132/100   Pulse 82   Temp (!) 97.3 F (36.3 C) (Temporal)   Wt 156 lb (70.8 kg)   SpO2 93%   BMI 23.04 kg/m   Gen: No acute distress, resting comfortably HEENT: TMs with clear effusion.  OP erythematous. CV: Regular rate and rhythm with no murmurs appreciated Pulm: Normal work of breathing, clear to auscultation bilaterally with no crackles, wheezes, or rhonchi Neuro: Grossly normal, moves all extremities Psych: Normal affect and thought content  Time Spent: 45 minutes of total time was spent on the date of the encounter performing the following actions: chart review prior to seeing the patient including recent visits with other providers and specialists, obtaining history, performing a medically necessary exam, counseling on the treatment plan, placing orders, and documenting in our EHR.        Michael HERO. Kennyth, MD 02/02/2024 1:33 PM  "

## 2024-02-02 NOTE — Assessment & Plan Note (Signed)
 I do lengthy discussion with the patient and family today regarding his COPD.  He still has severe dyspnea on exertion.  He is concerned about his hiatal hernia causing worsening symptoms and would like to have a bronchoscopy to help with his symptoms.  He has had some difficulty with staying consistent with nebulizer that we did discuss importance of staying consistent with prescribed medication.  He will follow-up with pulmonology in a few weeks.

## 2024-02-02 NOTE — Assessment & Plan Note (Signed)
 See anxiety A/P.  No longer on Prozac .  Depressive symptoms are manageable on current regimen of Remeron  and hydroxyzine  as above.  We are adding BuSpar  as above to help with his anxiety.

## 2024-02-02 NOTE — Assessment & Plan Note (Signed)
 Elevated today.  Likely due to his anxiety.  Previously well-controlled.  They will monitor at home and come back here in 2 weeks to recheck.

## 2024-02-14 ENCOUNTER — Ambulatory Visit (HOSPITAL_COMMUNITY)

## 2024-02-15 ENCOUNTER — Ambulatory Visit: Admitting: Family Medicine

## 2024-03-05 ENCOUNTER — Ambulatory Visit: Admitting: Internal Medicine

## 2024-03-06 ENCOUNTER — Ambulatory Visit: Payer: Medicare HMO

## 2024-04-22 ENCOUNTER — Ambulatory Visit

## 2024-06-05 ENCOUNTER — Ambulatory Visit: Payer: Medicare HMO
